# Patient Record
Sex: Male | Born: 1975
Health system: Southern US, Community
[De-identification: ages and names within clinical notes are randomized; demographics above are authoritative.]

## PROBLEM LIST (undated history)

## (undated) DIAGNOSIS — R0789 Other chest pain: Secondary | ICD-10-CM

## (undated) DIAGNOSIS — F329 Major depressive disorder, single episode, unspecified: Secondary | ICD-10-CM

## (undated) DIAGNOSIS — Z8619 Personal history of other infectious and parasitic diseases: Secondary | ICD-10-CM

## (undated) DIAGNOSIS — D171 Benign lipomatous neoplasm of skin and subcutaneous tissue of trunk: Secondary | ICD-10-CM

## (undated) DIAGNOSIS — F319 Bipolar disorder, unspecified: Secondary | ICD-10-CM

## (undated) DIAGNOSIS — F419 Anxiety disorder, unspecified: Secondary | ICD-10-CM

## (undated) DIAGNOSIS — E119 Type 2 diabetes mellitus without complications: Secondary | ICD-10-CM

## (undated) DIAGNOSIS — F1020 Alcohol dependence, uncomplicated: Secondary | ICD-10-CM

## (undated) DIAGNOSIS — F32A Depression, unspecified: Secondary | ICD-10-CM

## (undated) DIAGNOSIS — E785 Hyperlipidemia, unspecified: Secondary | ICD-10-CM

## (undated) DIAGNOSIS — K589 Irritable bowel syndrome without diarrhea: Secondary | ICD-10-CM

## (undated) DIAGNOSIS — Z972 Presence of dental prosthetic device (complete) (partial): Secondary | ICD-10-CM

## (undated) DIAGNOSIS — J302 Other seasonal allergic rhinitis: Secondary | ICD-10-CM

## (undated) DIAGNOSIS — Z87442 Personal history of urinary calculi: Secondary | ICD-10-CM

## (undated) HISTORY — DX: Type 2 diabetes mellitus without complications: E11.9

## (undated) HISTORY — DX: Depression, unspecified: F32.A

## (undated) HISTORY — DX: Irritable bowel syndrome, unspecified: K58.9

## (undated) HISTORY — DX: Alcohol dependence, uncomplicated: F10.20

## (undated) HISTORY — DX: Anxiety disorder, unspecified: F41.9

## (undated) HISTORY — DX: Other chest pain: R07.89

## (undated) HISTORY — DX: Personal history of other infectious and parasitic diseases: Z86.19

## (undated) HISTORY — DX: Major depressive disorder, single episode, unspecified: F32.9

---

## 2010-06-30 ENCOUNTER — Emergency Department (HOSPITAL_COMMUNITY)
Admission: EM | Admit: 2010-06-30 | Discharge: 2010-07-01 | Payer: Self-pay | Source: Home / Self Care | Admitting: Emergency Medicine

## 2010-07-09 ENCOUNTER — Emergency Department (HOSPITAL_COMMUNITY)
Admission: EM | Admit: 2010-07-09 | Discharge: 2010-07-09 | Payer: Self-pay | Source: Home / Self Care | Admitting: Emergency Medicine

## 2010-09-19 LAB — URINALYSIS, ROUTINE W REFLEX MICROSCOPIC
Glucose, UA: NEGATIVE mg/dL
Ketones, ur: NEGATIVE mg/dL
Leukocytes, UA: NEGATIVE
Nitrite: NEGATIVE
Protein, ur: NEGATIVE mg/dL
Specific Gravity, Urine: 1.028 (ref 1.005–1.030)
pH: 6 (ref 5.0–8.0)

## 2011-08-14 DIAGNOSIS — R5381 Other malaise: Secondary | ICD-10-CM | POA: Diagnosis not present

## 2012-01-02 ENCOUNTER — Emergency Department (HOSPITAL_COMMUNITY)
Admission: EM | Admit: 2012-01-02 | Discharge: 2012-01-03 | Disposition: A | Discharge status: Home / Self Care | Payer: Medicare Other | Attending: Emergency Medicine | Admitting: Emergency Medicine

## 2012-01-02 ENCOUNTER — Encounter (HOSPITAL_COMMUNITY): Payer: Self-pay | Admitting: *Deleted

## 2012-01-02 DIAGNOSIS — R112 Nausea with vomiting, unspecified: Secondary | ICD-10-CM | POA: Diagnosis not present

## 2012-01-02 DIAGNOSIS — R109 Unspecified abdominal pain: Secondary | ICD-10-CM | POA: Diagnosis not present

## 2012-01-02 DIAGNOSIS — F17200 Nicotine dependence, unspecified, uncomplicated: Secondary | ICD-10-CM | POA: Diagnosis not present

## 2012-01-02 DIAGNOSIS — F319 Bipolar disorder, unspecified: Secondary | ICD-10-CM | POA: Insufficient documentation

## 2012-01-02 DIAGNOSIS — E785 Hyperlipidemia, unspecified: Secondary | ICD-10-CM | POA: Insufficient documentation

## 2012-01-02 HISTORY — DX: Hyperlipidemia, unspecified: E78.5

## 2012-01-02 HISTORY — DX: Bipolar disorder, unspecified: F31.9

## 2012-01-02 LAB — COMPREHENSIVE METABOLIC PANEL
ALT: 19 U/L (ref 0–53)
AST: 22 U/L (ref 0–37)
Alkaline Phosphatase: 73 U/L (ref 39–117)
Calcium: 9.6 mg/dL (ref 8.4–10.5)
Creatinine, Ser: 1.01 mg/dL (ref 0.50–1.35)
Total Bilirubin: 0.5 mg/dL (ref 0.3–1.2)
Total Protein: 7.1 g/dL (ref 6.0–8.3)

## 2012-01-02 LAB — CBC
Hemoglobin: 15.5 g/dL (ref 13.0–17.0)
MCH: 30.7 pg (ref 26.0–34.0)
MCHC: 34.6 g/dL (ref 30.0–36.0)
MCV: 88.7 fL (ref 78.0–100.0)
RBC: 5.05 MIL/uL (ref 4.22–5.81)
RDW: 12.5 % (ref 11.5–15.5)

## 2012-01-02 LAB — URINE MICROSCOPIC-ADD ON

## 2012-01-02 LAB — URINALYSIS, ROUTINE W REFLEX MICROSCOPIC
Bilirubin Urine: NEGATIVE
Hgb urine dipstick: NEGATIVE
Ketones, ur: 15 mg/dL — AB
Nitrite: NEGATIVE
Specific Gravity, Urine: 1.029 (ref 1.005–1.030)
pH: 7.5 (ref 5.0–8.0)

## 2012-01-02 LAB — DIFFERENTIAL
Eosinophils Relative: 0 % (ref 0–5)
Lymphocytes Relative: 35 % (ref 12–46)
Neutrophils Relative %: 57 % (ref 43–77)

## 2012-01-02 MED ORDER — ONDANSETRON 4 MG PO TBDP
8.0000 mg | ORAL_TABLET | Freq: Once | ORAL | Status: AC
  Administered 2012-01-02: 4 mg via ORAL
  Filled 2012-01-02: qty 2

## 2012-01-02 MED ORDER — MORPHINE SULFATE 4 MG/ML IJ SOLN
4.0000 mg | Freq: Once | INTRAMUSCULAR | Status: AC
  Administered 2012-01-02: 4 mg via INTRAVENOUS
  Filled 2012-01-02: qty 1

## 2012-01-02 MED ORDER — ONDANSETRON 8 MG PO TBDP: 8.0000 mg | ORAL_TABLET | Freq: Three times a day (TID) | ORAL | Status: AC | PRN

## 2012-01-02 MED ORDER — SODIUM CHLORIDE 0.9 % IV BOLUS (SEPSIS)
1000.0000 mL | Freq: Once | INTRAVENOUS | Status: AC
  Administered 2012-01-02: 1000 mL via INTRAVENOUS

## 2012-01-02 MED ORDER — ONDANSETRON HCL 4 MG/2ML IJ SOLN
4.0000 mg | Freq: Once | INTRAMUSCULAR | Status: AC
  Administered 2012-01-02: 4 mg via INTRAVENOUS
  Filled 2012-01-02: qty 2

## 2012-01-02 NOTE — Discharge Instructions (Signed)
Follow up with your primary care doctor about your hospital visit. Continue to hydrate orally.Take all medications as prescribed & use Zofran as directed for nausea & vomiting.  Read the instructions below for reasons to return to the ER.  ° °The 'BRAT' diet is suggested, then progress to diet as tolerated as symptoms abate. Call if bloody stools, persistent diarrhea, vomiting, fever or abdominal pain. °Bananas.  °Rice.  °Applesauce.  °Toast (and other simple starches such as crackers, potatoes, noodles).  ° °SEEK IMMEDIATE MEDICAL ATTENTION IF: ° °You begin having localized abdominal pain that does not go away or becomes severe (The right side could  possibly be appendicitis. In an adult, the left lower portion of the abdomen could be colitis or diverticulitis) °  °A temperature above 101 develops ° °Repeated vomiting occurs (multiple uncontrollable episodes) or you are unable to keep fluids down ° °Blood is being passed in stools or vomit (bright red or black tarry stools).  ° °Return also if you develop chest pain, difficulty breathing, dizziness or fainting, or become confused, poorly responsive, or inconsolable (young children). ° ° °RESOURCE GUIDE ° °Dental Problems ° °Patients with Medicaid: °[facility]                     [facility] °[address]                                           [address] °Phone:  [phone]                                                  Phone:  [phone] ° °If unable to pay or uninsured, contact:  [facility] or [facility] to become qualified for the adult dental clinic. ° °Chronic Pain Problems °Contact [facility]  [phone] °Patients need to be referred by their primary care doctor. ° °Insufficient Money for Medicine °Contact [employer]:  call "211" or [facility] 271-5999. ° °No Primary Care Doctor °Call [facility]  832-8000 °Other agencies that provide inexpensive medical care °   [facility]  [phone] °   [facility]  [phone] °   [facility]  [phone] °   [facility]  832-4777 °   [facility]  [phone] °   [facility]  [phone] ° °Psychological Services °[facility]  [phone] °[facility]  [phone] °[facility]   [phone] (emergency services [phone]) ° °Substance Abuse Resources °[facility]  336-882-2125 °[facility] 336-784-9470 °[facility] 336-285-9073 °[facility] 336-845-3988 °Residential & Outpatient Substance Abuse Program  800-659-3381 ° °Abuse/Neglect °[place] [facility] (336) 641-3795 °[place] [facility] 800-378-5315 (After Hours) ° °Emergency Shelter °[facility] (336) 271-5985 ° °Maternity Homes °[facility] (336) 275-9566 °[facility] (704) 372-4663 ° °MRSA Hotline #:   832-7006 ° ° ° °[place] [facility] ° °[facility]     [employer]                            [facility] °[address] [place]                       [address]      [address]  °[place]                                                [place]                            [place] °Phone:  349-3220                                   Phone:  [phone]                 Phone:  342-8140 ° °[facility] °Phone:  342-8316 ° °[place] [facility] °(336) 342-1394 °(336) 342-3537 (After Hours) ° ° ° ° °

## 2012-01-02 NOTE — ED Notes (Signed)
Patient states someone at his work placed chemicals in his drink, patient now with abdominal pain and cramping, +n/v, patient states some blood in emesis

## 2012-01-02 NOTE — ED Provider Notes (Signed)
History     CSN: [id number]  Arrival date & time 01/02/12  9147   First MD Initiated Contact with Patient 01/02/12 2015      Chief Complaint  Patient presents with  . Abdominal Pain    (Consider location/radiation/quality/duration/timing/severity/associated sxs/prior treatment) HPI Comments: Patient comes in today with concern that one of his coworkers put a Engineer, agricultural in his energy drink.  He reports that he did not see anyone put anything in his drink and neither did anyone else.  However, he states that his drink tasted different and smelled different after he left it unattended at work.  He then began vomiting.  He reports that he had four episodes of vomiting after drinking the liquid.  He also states there was some redness in his emesis, which he attributed to blood.  He also reports crampy lower abdominal pain that began after drinking this energy drink.  He denies any diarrhea.  He is urinating normally.  No confusion.  No syncope.  Of note he does have a history of Bipolar.     Patient is a 36 y.o. male presenting with abdominal pain. The history is provided by the patient.  Abdominal Pain The primary symptoms of the illness include abdominal pain, nausea, vomiting and hematemesis. The primary symptoms of the illness do not include fever, shortness of breath, diarrhea, hematochezia or dysuria.  The hematemesis is not associated with dizziness.  Symptoms associated with the illness do not include chills, constipation or hematuria.    Past Medical History  Diagnosis Date  . Bipolar 1 disorder   . Hyperlipemia     History reviewed. No pertinent past surgical history.  No family history on file.  History  Substance Use Topics  . Smoking status: Current Everyday Smoker  . Smokeless tobacco: Not on file  . Alcohol Use: No      Review of Systems  Constitutional: Negative for fever and chills.  HENT: Negative for sore throat, drooling and trouble swallowing.     Respiratory: Negative for chest tightness and shortness of breath.   Cardiovascular: Negative for chest pain.  Gastrointestinal: Positive for nausea, vomiting, abdominal pain and hematemesis. Negative for diarrhea, constipation, blood in stool, hematochezia and abdominal distention.  Genitourinary: Negative for dysuria, hematuria, decreased urine volume and difficulty urinating.  Skin: Negative for color change and rash.  Neurological: Negative for dizziness, syncope and light-headedness.    Allergies  Review of patient's allergies indicates no known allergies.  Home Medications  No current outpatient prescriptions on file.  BP 128/81  Pulse 87  Temp 98.6 F (37 C) (Oral)  Resp 19  SpO2 100%  Physical Exam  Nursing note and vitals reviewed. Constitutional: He appears well-developed and well-nourished.  Non-toxic appearance. He does not have a sickly appearance. He does not appear ill. No distress.  HENT:  Head: Normocephalic and atraumatic.  Mouth/Throat: Uvula is midline, oropharynx is clear and moist and mucous membranes are normal. No uvula swelling. No posterior oropharyngeal erythema.  Neck: Normal range of motion. Neck supple.  Cardiovascular: Normal rate, regular rhythm and normal heart sounds.   Pulmonary/Chest: Effort normal and breath sounds normal.  Abdominal: Soft. Normal appearance and bowel sounds are normal. There is tenderness in the right lower quadrant, suprapubic area and left lower quadrant. There is no rigidity, no rebound, no guarding and no CVA tenderness.       Patient did not appear to have any abdominal pain when palpating his abdomen while he was distracted.  Musculoskeletal: Normal range of motion.  Neurological: He is alert.  Skin: Skin is warm and dry. No rash noted. He is not diaphoretic.  Psychiatric: He has a normal mood and affect.    ED Course  Procedures (including critical care time)   Labs Reviewed  COMPREHENSIVE METABOLIC PANEL  CBC   DIFFERENTIAL  LIPASE, BLOOD  URINALYSIS, ROUTINE W REFLEX MICROSCOPIC   No results found.   No diagnosis found.  10:24 PM Reassessed patient.  No active vomiting in the ED.  He states that he vomited in the emesis bag.  When I looked in the bag there was only a small amount of saliva.  No blood.  Will po challenge and reassess.   11:01 PM Patient able to tolerate po liquids.  Patient asking to eat.  He states that he is hungry.    MDM  Patient thought that someone has put a chemical in his energy drink while at work.   VSS.  Labs unremarkable.  Patient non toxic appearing.  He reports several episodes of vomiting, but no vomiting seen in the ED.   Patient able to eat and tolerate po liquids.  Minimal abdominal tenderness on exam.  [employer] came and took samples of the energy drink.  Feel that patient can be discharged home.  Return precautions discussed.        Pascal Lux [place], PA-C 01/03/12 1336

## 2012-01-02 NOTE — ED Notes (Addendum)
Pt states about 1745 he drank two sips of his drink at work that burned his throat and noticed a strong smell to it so he stopped drinking it. Pt states about 15 mins after drinking it he began having lower abd pain, nausea, and vomiting.

## 2012-01-02 NOTE — ED Notes (Signed)
Patient now vomiting, asking for antinausea meds.

## 2012-01-03 NOTE — ED Provider Notes (Signed)
Medical screening examination/treatment/procedure(s) were performed by non-physician practitioner and as supervising physician I was immediately available for consultation/collaboration.   Taesean Reth, MD 01/03/12 1537 

## 2012-01-03 NOTE — ED Notes (Signed)
Pt discharged home in improved condition. 

## 2012-04-08 ENCOUNTER — Ambulatory Visit (INDEPENDENT_AMBULATORY_CARE_PROVIDER_SITE_OTHER): Payer: Federal, State, Local not specified - PPO | Admitting: Family Medicine

## 2012-04-08 VITALS — BP 106/60 | HR 67 | Temp 98.9°F | Resp 18 | Ht 71.0 in | Wt 188.4 lb

## 2012-04-08 DIAGNOSIS — R634 Abnormal weight loss: Secondary | ICD-10-CM

## 2012-04-08 DIAGNOSIS — D179 Benign lipomatous neoplasm, unspecified: Secondary | ICD-10-CM

## 2012-04-08 LAB — POCT CBC
Granulocyte percent: 44 %G (ref 37–80)
MCH, POC: 30.8 pg (ref 27–31.2)
MCV: 96.2 fL (ref 80–97)
MPV: 9.1 fL (ref 0–99.8)
POC Granulocyte: 2.4 (ref 2–6.9)
POC MID %: 7 %M (ref 0–12)
Platelet Count, POC: 187 10*3/uL (ref 142–424)
RBC: 4.83 M/uL (ref 4.69–6.13)

## 2012-04-08 NOTE — Progress Notes (Signed)
Subjective:    Patient ID: Curtis Townsend, male    DOB: December 14, 1975, 36 y.o.   MRN: [id number] Chief Complaint  Patient presents with  . Mass    Has a lump on Back (R side) x few months    HPI  Soft mass in Rt back getting larger and more painful - very worried about it and really wants it removed due to pain.  Has lost about 5 lbs in the past sev mos unintentionally.  Past Medical History  Diagnosis Date  . Bipolar 1 disorder   . Hyperlipemia     no current med.  . Seasonal allergies   . History of kidney stones   . Wears partial dentures     upper  . Lipoma of back 05/2012    right     Review of Systems  Constitutional: Positive for unexpected weight change. Negative for fever, chills, activity change and appetite change.  Cardiovascular: Negative for leg swelling.  Gastrointestinal: Negative for nausea, vomiting, abdominal pain, diarrhea and constipation.  Musculoskeletal: Positive for myalgias. Negative for joint swelling and gait problem.  Skin: Negative for rash.  Neurological: Negative for weakness and numbness.  Hematological: Negative for adenopathy. Does not bruise/bleed easily.      BP 106/60  Pulse 67  Temp 98.9 F (37.2 C) (Oral)  Resp 18  Ht 5\' 11"  (1.803 m)  Wt 188 lb 6.4 oz (85.458 kg)  BMI 26.28 kg/m2  SpO2 99% Objective:   Physical Exam  Constitutional: He is oriented to person, place, and time. He appears well-developed and well-nourished. No distress.  HENT:  Head: Normocephalic and atraumatic.  Eyes: Conjunctivae normal are normal. Pupils are equal, round, and reactive to light. No scleral icterus.  Neck: Normal range of motion. Neck supple. No thyromegaly present.  Cardiovascular: Normal rate, regular rhythm, normal heart sounds and intact distal pulses.   Pulmonary/Chest: Effort normal and breath sounds normal. No respiratory distress.  Musculoskeletal: He exhibits no edema.       Rt well defined subdermal soft nonmobile 3 in mass in  right upper back  Lymphadenopathy:    He has no cervical adenopathy.  Neurological: He is alert and oriented to person, place, and time.  Skin: Skin is warm and dry. He is not diaphoretic.  Psychiatric: He has a normal mood and affect. His behavior is normal.      Results for orders placed in visit on 04/08/12  POCT CBC      Component Value Range   WBC 5.4  4.6 - 10.2 K/uL   Lymph, poc 2.6  0.6 - 3.4   POC LYMPH PERCENT 49.0  10 - 50 %L   MID (cbc) 0.4  0 - 0.9   POC MID % 7.0  0 - 12 %M   POC Granulocyte 2.4  2 - 6.9   Granulocyte percent 44.0  37 - 80 %G   RBC 4.83  4.69 - 6.13 M/uL   Hemoglobin 14.9  14.1 - 18.1 g/dL   HCT, POC 62.9  52.8 - 53.7 %   MCV 96.2  80 - 97 fL   MCH, POC 30.8  27 - 31.2 pg   MCHC 32.0  31.8 - 35.4 g/dL   RDW, POC 41.3     Platelet Count, POC 187  142 - 424 K/uL   MPV 9.1  0 - 99.8 fL    Assessment & Plan:   1. Lipoma  Ambulatory referral to General Surgery  2. Weight loss  POCT CBC, HIV antibody, TSH

## 2012-04-08 NOTE — Patient Instructions (Signed)
Lipoma A lipoma is a noncancerous (benign) tumor composed of fat cells. They are usually found under the skin (subcutaneous). A lipoma may occur in any tissue of the body that contains fat. Common areas for lipomas to appear include the back, shoulders, buttocks, and thighs. Lipomas are a very common soft tissue growth. They are soft and grow slowly. Most problems caused by a lipoma depend on where it is growing. DIAGNOSIS  A lipoma can be diagnosed with a physical exam. These tumors rarely become cancerous, but radiographic studies can help determine this for certain. Studies used may include:  Computerized X-ray scans (CT or CAT scan).   Computerized magnetic scans (MRI).  TREATMENT  Small lipomas that are not causing problems may be watched. If a lipoma continues to enlarge or causes problems, removal is often the best treatment. Lipomas can also be removed to improve appearance. Surgery is done to remove the fatty cells and the surrounding capsule. Most often, this is done with medicine that numbs the area (local anesthetic). The removed tissue is examined under a microscope to make sure it is not cancerous. Keep all follow-up appointments with your caregiver. SEEK MEDICAL CARE IF:   The lipoma becomes larger or hard.   The lipoma becomes painful, red, or increasingly swollen. These could be signs of infection or a more serious condition.  Document Released: 06/16/2002 Document Revised: 06/15/2011 Document Reviewed: 11/26/2009 Exit[employer] Patient Information 2012 [employer]. 

## 2012-04-09 LAB — HIV ANTIBODY (ROUTINE TESTING W REFLEX): HIV: NONREACTIVE

## 2012-04-23 ENCOUNTER — Ambulatory Visit (INDEPENDENT_AMBULATORY_CARE_PROVIDER_SITE_OTHER): Payer: Medicare Other | Admitting: General Surgery

## 2012-04-24 ENCOUNTER — Telehealth (INDEPENDENT_AMBULATORY_CARE_PROVIDER_SITE_OTHER): Payer: Self-pay

## 2012-04-24 NOTE — Telephone Encounter (Signed)
Follow up call made to patient.  Patient no showed appointment on 04/23/12 with Dr. Derrell Lolling.

## 2012-05-10 DIAGNOSIS — D171 Benign lipomatous neoplasm of skin and subcutaneous tissue of trunk: Secondary | ICD-10-CM

## 2012-05-10 HISTORY — DX: Benign lipomatous neoplasm of skin and subcutaneous tissue of trunk: D17.1

## 2012-05-16 ENCOUNTER — Ambulatory Visit (INDEPENDENT_AMBULATORY_CARE_PROVIDER_SITE_OTHER): Payer: Federal, State, Local not specified - PPO | Admitting: General Surgery

## 2012-05-16 ENCOUNTER — Encounter (INDEPENDENT_AMBULATORY_CARE_PROVIDER_SITE_OTHER): Payer: Self-pay | Admitting: General Surgery

## 2012-05-16 VITALS — BP 122/76 | HR 82 | Temp 98.5°F | Resp 18 | Ht 72.0 in | Wt 186.0 lb

## 2012-05-16 DIAGNOSIS — D171 Benign lipomatous neoplasm of skin and subcutaneous tissue of trunk: Secondary | ICD-10-CM

## 2012-05-16 DIAGNOSIS — D1779 Benign lipomatous neoplasm of other sites: Secondary | ICD-10-CM

## 2012-05-16 NOTE — Progress Notes (Signed)
Patient ID: Curtis Townsend, male   DOB: [birth year], 36 y.o.   MRN: [id number]  Chief Complaint  Patient presents with  . New Evaluation    lipoma    HPI [name] is a 36 y.o. male is referred by Dr. Clelia Croft for a right back lipoma. He states he's noticed it for approximately 6 months has been causing discomfort. Patient is noticed no growth of the fatty mass during this time.there's been no drainage or erythema surrounding the mass .HPI  Past Medical History  Diagnosis Date  . Bipolar 1 disorder   . Hyperlipemia     No past surgical history on file.  No family history on file.  Social History History  Substance Use Topics  . Smoking status: Current Some Day Smoker  . Smokeless tobacco: Not on file  . Alcohol Use: No    No Known Allergies  Current Outpatient Prescriptions  Medication Sig Dispense Refill  . divalproex (DEPAKOTE SPRINKLE) 125 MG capsule Take 125 mg by mouth 2 (two) times daily.      Marland Kitchen OLANZapine zydis (ZYPREXA) 10 MG disintegrating tablet Take 10 mg by mouth at bedtime.        Review of Systems Review of Systems  Constitutional: Negative.   HENT: Negative.   Eyes: Negative.   Respiratory: Negative.   Cardiovascular: Negative.   Gastrointestinal: Negative.   Musculoskeletal: Negative.   Neurological: Negative.     Blood pressure 122/76, pulse 82, temperature 98.5 F (36.9 C), temperature source Oral, resp. rate 18, height 6' (1.829 m), weight 186 lb (84.369 kg).  Physical Exam Physical Exam  Constitutional: He is oriented to person, place, and time. He appears well-developed and well-nourished.  HENT:  Head: Normocephalic and atraumatic.  Eyes: Conjunctivae normal and EOM are normal. Pupils are equal, round, and reactive to light.  Neck: Normal range of motion. Neck supple.  Cardiovascular: Normal rate and regular rhythm.   Pulmonary/Chest: Effort normal and breath sounds normal.         Approximately 3 x 3 cm subcutaneous mobile  fatty mass  Abdominal: Soft. Bowel sounds are normal.  Musculoskeletal: Normal range of motion.  Neurological: He is oriented to person, place, and time.    Data Reviewed None  Assessment    The patient is a 37 year old male with a right back lipoma    Plan    1. We'll proceed operative for excision of his back lipoma  2. All risks and benefits were discussed with the patient, to generally include infection, bleeding, damage to surrounding structures, and recurrence. Alternatives were offered and described.  All questions were answered and the patient voiced understanding of the procedure and wishes to proceed at this point.        Marigene Ehlers., Helaine Yackel 05/16/2012, 10:52 AM

## 2012-06-04 ENCOUNTER — Encounter (HOSPITAL_BASED_OUTPATIENT_CLINIC_OR_DEPARTMENT_OTHER): Payer: Self-pay | Admitting: *Deleted

## 2012-06-10 ENCOUNTER — Encounter (HOSPITAL_BASED_OUTPATIENT_CLINIC_OR_DEPARTMENT_OTHER): Payer: Self-pay | Admitting: Certified Registered"

## 2012-06-10 ENCOUNTER — Encounter (HOSPITAL_BASED_OUTPATIENT_CLINIC_OR_DEPARTMENT_OTHER)
Admission: RE | Disposition: A | Discharge status: Home / Self Care | Payer: Self-pay | Source: Ambulatory Visit | Attending: General Surgery

## 2012-06-10 ENCOUNTER — Encounter (HOSPITAL_BASED_OUTPATIENT_CLINIC_OR_DEPARTMENT_OTHER): Payer: Self-pay | Admitting: *Deleted

## 2012-06-10 ENCOUNTER — Ambulatory Visit (HOSPITAL_BASED_OUTPATIENT_CLINIC_OR_DEPARTMENT_OTHER): Payer: Federal, State, Local not specified - PPO | Admitting: Certified Registered"

## 2012-06-10 ENCOUNTER — Ambulatory Visit (HOSPITAL_BASED_OUTPATIENT_CLINIC_OR_DEPARTMENT_OTHER)
Admission: RE | Admit: 2012-06-10 | Discharge: 2012-06-10 | Disposition: A | Discharge status: Home / Self Care | Payer: Federal, State, Local not specified - PPO | Source: Ambulatory Visit | Attending: General Surgery | Admitting: General Surgery

## 2012-06-10 DIAGNOSIS — F17200 Nicotine dependence, unspecified, uncomplicated: Secondary | ICD-10-CM | POA: Insufficient documentation

## 2012-06-10 DIAGNOSIS — F319 Bipolar disorder, unspecified: Secondary | ICD-10-CM | POA: Diagnosis not present

## 2012-06-10 DIAGNOSIS — D1779 Benign lipomatous neoplasm of other sites: Secondary | ICD-10-CM | POA: Diagnosis not present

## 2012-06-10 DIAGNOSIS — E785 Hyperlipidemia, unspecified: Secondary | ICD-10-CM | POA: Diagnosis not present

## 2012-06-10 DIAGNOSIS — D1739 Benign lipomatous neoplasm of skin and subcutaneous tissue of other sites: Secondary | ICD-10-CM | POA: Diagnosis not present

## 2012-06-10 HISTORY — PX: LIPOMA EXCISION: SHX5283

## 2012-06-10 HISTORY — DX: Other seasonal allergic rhinitis: J30.2

## 2012-06-10 HISTORY — DX: Personal history of urinary calculi: Z87.442

## 2012-06-10 HISTORY — DX: Presence of dental prosthetic device (complete) (partial): Z97.2

## 2012-06-10 HISTORY — DX: Benign lipomatous neoplasm of skin and subcutaneous tissue of trunk: D17.1

## 2012-06-10 SURGERY — EXCISION LIPOMA
Anesthesia: General | Site: Back | Laterality: Right | Wound class: Clean

## 2012-06-10 MED ORDER — BUPIVACAINE HCL (PF) 0.25 % IJ SOLN
INTRAMUSCULAR | Status: DC | PRN
  Administered 2012-06-10: 10 mL

## 2012-06-10 MED ORDER — CEFAZOLIN SODIUM-DEXTROSE 2-3 GM-% IV SOLR
2.0000 g | INTRAVENOUS | Status: AC
  Administered 2012-06-10: 2 g via INTRAVENOUS

## 2012-06-10 MED ORDER — PROPOFOL 10 MG/ML IV BOLUS
INTRAVENOUS | Status: DC | PRN
  Administered 2012-06-10: 150 mg via INTRAVENOUS

## 2012-06-10 MED ORDER — GLYCOPYRROLATE 0.2 MG/ML IJ SOLN
INTRAMUSCULAR | Status: DC | PRN
  Administered 2012-06-10: 0.2 mg via INTRAVENOUS

## 2012-06-10 MED ORDER — LACTATED RINGERS IV SOLN
INTRAVENOUS | Status: DC
  Administered 2012-06-10: 14:00:00 via INTRAVENOUS

## 2012-06-10 MED ORDER — SUCCINYLCHOLINE CHLORIDE 20 MG/ML IJ SOLN
INTRAMUSCULAR | Status: DC | PRN
  Administered 2012-06-10: 140 mg via INTRAVENOUS

## 2012-06-10 MED ORDER — MIDAZOLAM HCL 2 MG/2ML IJ SOLN: 1.0000 mg | INTRAMUSCULAR | Status: DC | PRN

## 2012-06-10 MED ORDER — FENTANYL CITRATE 0.05 MG/ML IJ SOLN
INTRAMUSCULAR | Status: DC | PRN
  Administered 2012-06-10: 100 ug via INTRAVENOUS

## 2012-06-10 MED ORDER — LIDOCAINE HCL (CARDIAC) 20 MG/ML IV SOLN
INTRAVENOUS | Status: DC | PRN
  Administered 2012-06-10: 100 mg via INTRAVENOUS

## 2012-06-10 MED ORDER — DEXAMETHASONE SODIUM PHOSPHATE 4 MG/ML IJ SOLN
INTRAMUSCULAR | Status: DC | PRN
  Administered 2012-06-10: 10 mg via INTRAVENOUS

## 2012-06-10 MED ORDER — ONDANSETRON HCL 4 MG/2ML IJ SOLN
INTRAMUSCULAR | Status: DC | PRN
  Administered 2012-06-10: 4 mg via INTRAVENOUS

## 2012-06-10 MED ORDER — FENTANYL CITRATE 0.05 MG/ML IJ SOLN: 50.0000 ug | INTRAMUSCULAR | Status: DC | PRN

## 2012-06-10 MED ORDER — CHLORHEXIDINE GLUCONATE 4 % EX LIQD: 1.0000 "application " | Freq: Once | CUTANEOUS | Status: DC

## 2012-06-10 MED ORDER — OXYCODONE-ACETAMINOPHEN 5-325 MG PO TABS: 1.0000 | ORAL_TABLET | ORAL | Status: DC | PRN

## 2012-06-10 MED ORDER — MIDAZOLAM HCL 5 MG/5ML IJ SOLN
INTRAMUSCULAR | Status: DC | PRN
  Administered 2012-06-10: 2 mg via INTRAVENOUS

## 2012-06-10 SURGICAL SUPPLY — 46 items
APPLIER CLIP 9.375 MED OPEN (MISCELLANEOUS)
BENZOIN TINCTURE PRP APPL 2/3 (GAUZE/BANDAGES/DRESSINGS) IMPLANT
BLADE SURG 15 STRL LF DISP TIS (BLADE) ×1 IMPLANT
BLADE SURG 15 STRL SS (BLADE) ×1
CANISTER SUCTION 1200CC (MISCELLANEOUS) IMPLANT
CHLORAPREP W/TINT 26ML (MISCELLANEOUS) ×2 IMPLANT
CLIP APPLIE 9.375 MED OPEN (MISCELLANEOUS) IMPLANT
CLOTH BEACON ORANGE TIMEOUT ST (SAFETY) ×2 IMPLANT
COVER MAYO STAND STRL (DRAPES) ×2 IMPLANT
COVER TABLE BACK 60X90 (DRAPES) ×2 IMPLANT
DECANTER SPIKE VIAL GLASS SM (MISCELLANEOUS) IMPLANT
DERMABOND ADVANCED (GAUZE/BANDAGES/DRESSINGS) ×1
DERMABOND ADVANCED .7 DNX12 (GAUZE/BANDAGES/DRESSINGS) ×1 IMPLANT
DRAPE PED LAPAROTOMY (DRAPES) ×2 IMPLANT
DRSG TEGADERM 4X4.75 (GAUZE/BANDAGES/DRESSINGS) IMPLANT
ELECT COATED BLADE 2.86 ST (ELECTRODE) ×2 IMPLANT
ELECT REM PT RETURN 9FT ADLT (ELECTROSURGICAL) ×2
ELECTRODE REM PT RTRN 9FT ADLT (ELECTROSURGICAL) ×1 IMPLANT
GAUZE SPONGE 4X4 12PLY STRL LF (GAUZE/BANDAGES/DRESSINGS) IMPLANT
GLOVE BIO SURGEON STRL SZ7.5 (GLOVE) ×2 IMPLANT
GLOVE BIOGEL PI IND STRL 8 (GLOVE) ×1 IMPLANT
GLOVE BIOGEL PI INDICATOR 8 (GLOVE) ×1
GLOVE ECLIPSE 6.5 STRL STRAW (GLOVE) ×2 IMPLANT
GOWN PREVENTION PLUS XLARGE (GOWN DISPOSABLE) ×4 IMPLANT
NEEDLE HYPO 25X1 1.5 SAFETY (NEEDLE) ×2 IMPLANT
NS IRRIG 1000ML POUR BTL (IV SOLUTION) ×2 IMPLANT
PACK BASIN DAY SURGERY FS (CUSTOM PROCEDURE TRAY) ×2 IMPLANT
PENCIL BUTTON HOLSTER BLD 10FT (ELECTRODE) ×2 IMPLANT
SLEEVE SCD COMPRESS KNEE MED (MISCELLANEOUS) ×2 IMPLANT
SPONGE LAP 4X18 X RAY DECT (DISPOSABLE) ×2 IMPLANT
STRIP CLOSURE SKIN 1/2X4 (GAUZE/BANDAGES/DRESSINGS) IMPLANT
SUT MNCRL AB 3-0 PS2 18 (SUTURE) IMPLANT
SUT MNCRL AB 4-0 PS2 18 (SUTURE) ×2 IMPLANT
SUT SILK 2 0 SH (SUTURE) IMPLANT
SUT VIC AB 2-0 SH 27 (SUTURE)
SUT VIC AB 2-0 SH 27XBRD (SUTURE) IMPLANT
SUT VIC AB 3-0 SH 27 (SUTURE) ×1
SUT VIC AB 3-0 SH 27X BRD (SUTURE) ×1 IMPLANT
SUT VIC AB 5-0 PS2 18 (SUTURE) IMPLANT
SUT VICRYL AB 3 0 TIES (SUTURE) IMPLANT
SYR BULB 3OZ (MISCELLANEOUS) ×2 IMPLANT
SYR CONTROL 10ML LL (SYRINGE) ×2 IMPLANT
TOWEL OR 17X24 6PK STRL BLUE (TOWEL DISPOSABLE) ×2 IMPLANT
TOWEL OR NON WOVEN STRL DISP B (DISPOSABLE) ×2 IMPLANT
TUBE CONNECTING 20X1/4 (TUBING) IMPLANT
YANKAUER SUCT BULB TIP NO VENT (SUCTIONS) IMPLANT

## 2012-06-10 NOTE — Interval H&P Note (Signed)
History and Physical Interval Note:  06/10/2012 1:49 PM  Curtis Townsend  has presented today for surgery, with the diagnosis of back lipoma  The various methods of treatment have been discussed with the patient and family. After consideration of risks, benefits and other options for treatment, the patient has consented to  Procedure(s) (LRB) with comments: EXCISION LIPOMA (Right) - excision of back lipoma on right 3x3cm as a surgical intervention .  The patient's history has been reviewed, patient examined, no change in status, stable for surgery.  I have reviewed the patient's chart and labs.  Questions were answered to the patient's satisfaction.     Marigene Ehlers., Jed Limerick

## 2012-06-10 NOTE — Op Note (Signed)
Pre Operative Diagnosis:  Back fatty mass  Post Operative Diagnosis: same  Procedure: excision of fatty mass  Surgeon: Dr. Axel Filler  Assistant: none  Anesthesia: GETA  EBL: less than 5 cc  Complications: none  Counts: reported as correct x 2  Findings:  The patient had possibly a 4 cm fatty mass deep to the latissimus dorsi muscle  Indications for procedure:  The patient is a 36 year old male with history of right-sided back a mass becoming larger in size and more bothersome to the patient. Patient was seen in clinic and set at the selectively removed.  Details of the procedure:The patient was taken back to the operating room. The patient was placed in prone position with bilateral SCDs in place. After appropriate anitbiotics were confirmed, a time-out was confirmed and all facts were verified.  a 2.5 cm incision was made over the fatty mass.  Bovie cautery was used to maintain hemostasis and dissection was carried down to the fascia. At this point seemed like a fatty mass was deep to the latissimus dorsi muscle. This muscle was split and the fatty mass appeared below this muscle. I begin to extract the fatty mass from the surrounding tissue using Bovie cautery. The mass was then removed in 2 pieces with a majority of the capsule encasing the mass. The mass or fat or capsule left in the wound. There is irrigated out sterile saline the fascia was then reapproximated using a 3-0 Vicryl. Skin was reapproximated using 4-0 Monocryl subcuticular fashion. The skin was Dermabond.The patient was taken to the recovery room in stable condition.

## 2012-06-10 NOTE — Anesthesia Preprocedure Evaluation (Signed)
Anesthesia Evaluation  Patient identified by MRN, date of birth, ID band Patient awake    Reviewed: Allergy & Precautions, H&P , NPO status , Patient's Chart, lab work & pertinent test results  Airway Mallampati: II TM Distance: >3 FB Neck ROM: Full    Dental   Pulmonary          Cardiovascular     Neuro/Psych    GI/Hepatic   Endo/Other    Renal/GU      Musculoskeletal   Abdominal   Peds  Hematology   Anesthesia Other Findings   Reproductive/Obstetrics                           Anesthesia Physical Anesthesia Plan  ASA: III  Anesthesia Plan: General   Post-op Pain Management:    Induction: Intravenous  Airway Management Planned: Oral ETT  Additional Equipment:   Intra-op Plan:   Post-operative Plan: Extubation in OR  Informed Consent: I have reviewed the patients History and Physical, chart, labs and discussed the procedure including the risks, benefits and alternatives for the proposed anesthesia with the patient or authorized representative who has indicated his/her understanding and acceptance.     Plan Discussed with: CRNA and Surgeon  Anesthesia Plan Comments:         Anesthesia Quick Evaluation

## 2012-06-10 NOTE — Transfer of Care (Signed)
Immediate Anesthesia Transfer of Care Note  Patient: Curtis Townsend  Procedure(s) Performed: Procedure(s) (LRB) with comments: EXCISION LIPOMA (Right) - excision of back lipoma on right 3x3cm  Patient Location: PACU  Anesthesia Type:General  Level of Consciousness: sedated and patient cooperative  Airway & Oxygen Therapy: Patient Spontanous Breathing and Patient connected to face mask oxygen  Post-op Assessment: Report given to PACU RN and Post -op Vital signs reviewed and stable  Post vital signs: Reviewed and stable  Complications: No apparent anesthesia complications

## 2012-06-10 NOTE — H&P (View-Only) (Signed)
Patient ID: Curtis Townsend, male   DOB: 11/17/1975, 36 y.o.   MRN: [id number]  Chief Complaint  Patient presents with  . New Evaluation    lipoma    HPI [name] is a 36 y.o. male is referred by Dr. Shaw for a right back lipoma. He states he's noticed it for approximately 6 months has been causing discomfort. Patient is noticed no growth of the fatty mass during this time.there's been no drainage or erythema surrounding the mass .HPI  Past Medical History  Diagnosis Date  . Bipolar 1 disorder   . Hyperlipemia     No past surgical history on file.  No family history on file.  Social History History  Substance Use Topics  . Smoking status: Current Some Day Smoker  . Smokeless tobacco: Not on file  . Alcohol Use: No    No Known Allergies  Current Outpatient Prescriptions  Medication Sig Dispense Refill  . divalproex (DEPAKOTE SPRINKLE) 125 MG capsule Take 125 mg by mouth 2 (two) times daily.      . OLANZapine zydis (ZYPREXA) 10 MG disintegrating tablet Take 10 mg by mouth at bedtime.        Review of Systems Review of Systems  Constitutional: Negative.   HENT: Negative.   Eyes: Negative.   Respiratory: Negative.   Cardiovascular: Negative.   Gastrointestinal: Negative.   Musculoskeletal: Negative.   Neurological: Negative.     Blood pressure 122/76, pulse 82, temperature 98.5 F (36.9 C), temperature source Oral, resp. rate 18, height 6' (1.829 m), weight 186 lb (84.369 kg).  Physical Exam Physical Exam  Constitutional: He is oriented to person, place, and time. He appears well-developed and well-nourished.  HENT:  Head: Normocephalic and atraumatic.  Eyes: Conjunctivae normal and EOM are normal. Pupils are equal, round, and reactive to light.  Neck: Normal range of motion. Neck supple.  Cardiovascular: Normal rate and regular rhythm.   Pulmonary/Chest: Effort normal and breath sounds normal.         Approximately 3 x 3 cm subcutaneous mobile  fatty mass  Abdominal: Soft. Bowel sounds are normal.  Musculoskeletal: Normal range of motion.  Neurological: He is oriented to person, place, and time.    Data Reviewed None  Assessment    The patient is a 36-year-old male with a right back lipoma    Plan    1. We'll proceed operative for excision of his back lipoma  2. All risks and benefits were discussed with the patient, to generally include infection, bleeding, damage to surrounding structures, and recurrence. Alternatives were offered and described.  All questions were answered and the patient voiced understanding of the procedure and wishes to proceed at this point.        Ivyana Locey Jr., Annleigh Knueppel 05/16/2012, 10:52 AM    

## 2012-06-10 NOTE — Anesthesia Postprocedure Evaluation (Signed)
Anesthesia Post Note  Patient: Curtis Townsend  Procedure(s) Performed: Procedure(s) (LRB): EXCISION LIPOMA (Right)  Anesthesia type: general  Patient location: PACU  Post pain: Pain level controlled  Post assessment: Patient's Cardiovascular Status Stable  Last Vitals:  Filed Vitals:   06/10/12 1555  BP:   Pulse: 71  Temp: 36.4 C  Resp: 14    Post vital signs: Reviewed and stable  Level of consciousness: sedated  Complications: No apparent anesthesia complications

## 2012-06-10 NOTE — Anesthesia Procedure Notes (Signed)
Procedure Name: Intubation Date/Time: 06/10/2012 2:15 PM Performed by: Verlan Friends Pre-anesthesia Checklist: Patient identified, Emergency Drugs available, Suction available, Patient being monitored and Timeout performed Patient Re-evaluated:Patient Re-evaluated prior to inductionOxygen Delivery Method: Circle System Utilized Preoxygenation: Pre-oxygenation with 100% oxygen Intubation Type: IV induction Ventilation: Mask ventilation without difficulty Laryngoscope Size: Miller and 3 Grade View: Grade I Tube type: Oral Tube size: 8.0 mm Number of attempts: 1 Airway Equipment and Method: stylet and oral airway Placement Confirmation: ETT inserted through vocal cords under direct vision,  positive ETCO2 and breath sounds checked- equal and bilateral Secured at: 24 (at lip) cm Tube secured with: Tape Dental Injury: Teeth and Oropharynx as per pre-operative assessment

## 2012-06-11 ENCOUNTER — Encounter (HOSPITAL_BASED_OUTPATIENT_CLINIC_OR_DEPARTMENT_OTHER): Payer: Self-pay | Admitting: General Surgery

## 2012-06-27 ENCOUNTER — Ambulatory Visit (INDEPENDENT_AMBULATORY_CARE_PROVIDER_SITE_OTHER): Payer: Federal, State, Local not specified - PPO | Admitting: General Surgery

## 2012-06-27 ENCOUNTER — Encounter (INDEPENDENT_AMBULATORY_CARE_PROVIDER_SITE_OTHER): Payer: Self-pay | Admitting: General Surgery

## 2012-06-27 VITALS — BP 112/74 | HR 72 | Temp 98.2°F | Resp 14 | Ht 73.0 in | Wt 178.4 lb

## 2012-06-27 DIAGNOSIS — Z09 Encounter for follow-up examination after completed treatment for conditions other than malignant neoplasm: Secondary | ICD-10-CM

## 2012-06-27 MED ORDER — OXYCODONE-ACETAMINOPHEN 5-325 MG PO TABS: 1.0000 | ORAL_TABLET | ORAL | Status: AC | PRN

## 2012-06-27 NOTE — Progress Notes (Signed)
Patient ID: Curtis Townsend, male   DOB: Nov 05, 1975, 36 y.o.   MRN: [id number] The patient is a 36 year old male status post lipoma removed from his back. Patient did do well aside from some muscle soreness. The patient is returned to work with minimal complaints.  On exam: Wounds clean dry  And intact  Assessment and plan:  1. Patient prescription for Percocet  2. Patient felt when necessary

## 2012-06-28 ENCOUNTER — Emergency Department (HOSPITAL_COMMUNITY)
Admission: EM | Admit: 2012-06-28 | Discharge: 2012-06-28 | Disposition: A | Discharge status: Home / Self Care | Payer: Federal, State, Local not specified - PPO | Attending: Emergency Medicine | Admitting: Emergency Medicine

## 2012-06-28 ENCOUNTER — Emergency Department (HOSPITAL_COMMUNITY): Payer: Federal, State, Local not specified - PPO

## 2012-06-28 DIAGNOSIS — R42 Dizziness and giddiness: Secondary | ICD-10-CM | POA: Insufficient documentation

## 2012-06-28 DIAGNOSIS — Z8589 Personal history of malignant neoplasm of other organs and systems: Secondary | ICD-10-CM | POA: Insufficient documentation

## 2012-06-28 DIAGNOSIS — F319 Bipolar disorder, unspecified: Secondary | ICD-10-CM | POA: Insufficient documentation

## 2012-06-28 DIAGNOSIS — W1809XA Striking against other object with subsequent fall, initial encounter: Secondary | ICD-10-CM | POA: Insufficient documentation

## 2012-06-28 DIAGNOSIS — Z79899 Other long term (current) drug therapy: Secondary | ICD-10-CM | POA: Diagnosis not present

## 2012-06-28 DIAGNOSIS — S060X0A Concussion without loss of consciousness, initial encounter: Secondary | ICD-10-CM | POA: Diagnosis not present

## 2012-06-28 DIAGNOSIS — S060X9A Concussion with loss of consciousness of unspecified duration, initial encounter: Secondary | ICD-10-CM | POA: Diagnosis not present

## 2012-06-28 DIAGNOSIS — E785 Hyperlipidemia, unspecified: Secondary | ICD-10-CM | POA: Diagnosis not present

## 2012-06-28 DIAGNOSIS — Z87442 Personal history of urinary calculi: Secondary | ICD-10-CM | POA: Insufficient documentation

## 2012-06-28 DIAGNOSIS — S0990XA Unspecified injury of head, initial encounter: Secondary | ICD-10-CM | POA: Diagnosis not present

## 2012-06-28 DIAGNOSIS — Y939 Activity, unspecified: Secondary | ICD-10-CM | POA: Insufficient documentation

## 2012-06-28 DIAGNOSIS — Y929 Unspecified place or not applicable: Secondary | ICD-10-CM | POA: Insufficient documentation

## 2012-06-28 DIAGNOSIS — F17200 Nicotine dependence, unspecified, uncomplicated: Secondary | ICD-10-CM | POA: Insufficient documentation

## 2012-06-28 DIAGNOSIS — S060XAA Concussion with loss of consciousness status unknown, initial encounter: Secondary | ICD-10-CM | POA: Diagnosis not present

## 2012-06-28 MED ORDER — OXYCODONE-ACETAMINOPHEN 5-325 MG PO TABS
2.0000 | ORAL_TABLET | Freq: Once | ORAL | Status: AC
  Administered 2012-06-28: 2 via ORAL
  Filled 2012-06-28: qty 2

## 2012-06-28 MED ORDER — METOCLOPRAMIDE HCL 5 MG/ML IJ SOLN
10.0000 mg | Freq: Once | INTRAMUSCULAR | Status: AC
  Administered 2012-06-28: 10 mg via INTRAMUSCULAR
  Filled 2012-06-28: qty 2

## 2012-06-28 MED ORDER — METOCLOPRAMIDE HCL 10 MG PO TABS: 10.0000 mg | ORAL_TABLET | Freq: Four times a day (QID) | ORAL | Status: DC

## 2012-06-28 NOTE — ED Notes (Signed)
Family at bedside. 

## 2012-06-28 NOTE — ED Provider Notes (Signed)
History     CSN: [id number]  Arrival date & time 06/28/12  1827   First MD Initiated Contact with Patient 06/28/12 1844      Chief Complaint  Patient presents with  . Head Injury    (Consider location/radiation/quality/duration/timing/severity/associated sxs/prior treatment) The history is provided by the patient.  Curtis Townsend is a 36 y.o. male hx of bipolar, HL, lipoma with recent excision here with HA s/p head injury. Around 3:30pm today, he bent down and stood up and his his head on a wooden bar. He then feel light headed and dizzy. Subsequently he had tingling throughout his whole spine and pain on L shoulder. But didn't have any shoulder or back injury during the incident. Never had intracranial bleed in the past.    Past Medical History  Diagnosis Date  . Bipolar 1 disorder   . Hyperlipemia     no current med.  . Seasonal allergies   . History of kidney stones   . Wears partial dentures     upper  . Lipoma of back 05/2012    right    Past Surgical History  Procedure Date  . No past surgeries   . Lipoma excision 06/10/2012    Procedure: EXCISION LIPOMA;  Surgeon: Axel Filler, MD;  Location: [facility];  Service: General;  Laterality: Right;  excision of back lipoma on right 3x3cm    No family history on file.  History  Substance Use Topics  . Smoking status: Current Some Day Smoker -- 16 years    Types: Cigarettes  . Smokeless tobacco: Never Used     Comment: 1-2 cig./month  . Alcohol Use: Yes     Comment: socially      Review of Systems  Neurological: Positive for dizziness and headaches.       Tingling   All other systems reviewed and are negative.    Allergies  Review of patient's allergies indicates no known allergies.  Home Medications   Current Outpatient Rx  Name  Route  Sig  Dispense  Refill  . DIVALPROEX SODIUM 500 MG PO TBEC   Oral   Take 1,000 mg by mouth at bedtime.         . MULTI-VITAMIN/MINERALS PO  TABS   Oral   Take 1 tablet by mouth daily.         Marland Kitchen OLANZAPINE 10 MG PO TBDP   Oral   Take 10 mg by mouth at bedtime.         . OXYCODONE-ACETAMINOPHEN 5-325 MG PO TABS   Oral   Take 1 tablet by mouth every 4 (four) hours as needed for pain.   20 tablet   0   . SERTRALINE HCL 50 MG PO TABS   Oral   Take 50 mg by mouth daily.           BP 108/67  Pulse 75  Temp 98.6 F (37 C) (Oral)  Resp 16  SpO2 99%  Physical Exam  Nursing note and vitals reviewed. Constitutional: He is oriented to person, place, and time. He appears well-developed and well-nourished.       Closing his eyes, NAD   HENT:  Head: Normocephalic.  Mouth/Throat: Oropharynx is clear and moist.       No scalp hematoma   Eyes: Conjunctivae normal are normal. Pupils are equal, round, and reactive to light.  Neck: Normal range of motion. Neck supple.       Neck ROM nl  Cardiovascular: Normal rate, regular rhythm and normal heart sounds.   Pulmonary/Chest: Effort normal and breath sounds normal. No respiratory distress. He has no wheezes. He has no rales.  Abdominal: Soft. Bowel sounds are normal. He exhibits no distension. There is no tenderness. There is no rebound.  Musculoskeletal: Normal range of motion. He exhibits no edema and no tenderness.       No midline tenderness. + L trapezius tenderness.   Neurological: He is alert and oriented to person, place, and time. No cranial nerve deficit.       Nl gait. Nl strength and sensation throughout   Skin: Skin is warm and dry.  Psychiatric: He has a normal mood and affect. His behavior is normal. Judgment and thought content normal.    ED Course  Procedures (including critical care time)  Labs Reviewed - No data to display Ct Head Wo Contrast  06/28/2012  *RADIOLOGY REPORT*  Clinical Data: Trauma to head.  Dizzy.  CT HEAD WITHOUT CONTRAST  Technique:  Contiguous axial images were obtained from the base of the skull through the vertex without  contrast.  Comparison: None.  Findings: No acute infarct, hemorrhage, or mass lesion is present. The ventricles are of normal size.  No significant extra-axial fluid collection is present.  There is chronic desiccation the right frontal sinus, anterior ethmoid air cells and right maxillary sinus.  Right maxillary sinus appears expanded may represent a mucocele.  The paranasal sinuses are otherwise clear.  Mastoid air cells are clear bilaterally.  The osseous skull is intact.  No significant extracranial soft tissue injury is evident.  IMPRESSION: 1.  Normal CT appearance of the brain. 2.  Chronic right-sided paranasal sinus disease.  Question right maxillary sinus mucocele.   Original Report Authenticated By: Marin Roberts, M.D.      No diagnosis found.    MDM  Curtis Townsend is a 36 y.o. male here with head injury with headaches. Likely has concussion. Will do CT head, give meds and reassess.   8:00 PM CT head nl. Felt better after percocet and reglan. Will d/c home with reglan prn. He has a percocet prescription at home.        Richardean Canal, MD 06/28/12 Rosamaria Lints

## 2012-06-28 NOTE — ED Notes (Signed)
Pt was bending over and stood back up and hit his head on a wooden bar. Pt states he was dizzy and fell to the ground. Pt c/o pain between shoulders and tingling to bottom of spine. Pt denies LOC. Pt c/o slight nausea. Injury occurred at 1330. Pt a/o x 3.

## 2012-06-28 NOTE — Progress Notes (Signed)
Spouse at bedside confirmed pt has medicare and no longer sees Norberto Sorenson after he completed a physical Reports he will be seeing her pcp after she makes and appointment after the new year Reports pt will also be placed on her [employer] employee insurance coverage.  CM discussed with spouse need to contact insurance carrier customer service to verify new pcp is in network with [employer].  She and pt voiced understanding

## 2012-09-02 ENCOUNTER — Other Ambulatory Visit (INDEPENDENT_AMBULATORY_CARE_PROVIDER_SITE_OTHER): Payer: Federal, State, Local not specified - PPO

## 2012-09-02 ENCOUNTER — Encounter: Payer: Self-pay | Admitting: Internal Medicine

## 2012-09-02 ENCOUNTER — Ambulatory Visit (INDEPENDENT_AMBULATORY_CARE_PROVIDER_SITE_OTHER): Payer: Federal, State, Local not specified - PPO | Admitting: Internal Medicine

## 2012-09-02 VITALS — BP 118/76 | HR 76 | Temp 98.3°F | Ht 73.0 in | Wt 172.8 lb

## 2012-09-02 DIAGNOSIS — Z5181 Encounter for therapeutic drug level monitoring: Secondary | ICD-10-CM

## 2012-09-02 DIAGNOSIS — R7302 Impaired glucose tolerance (oral): Secondary | ICD-10-CM

## 2012-09-02 DIAGNOSIS — Z13220 Encounter for screening for lipoid disorders: Secondary | ICD-10-CM | POA: Diagnosis not present

## 2012-09-02 DIAGNOSIS — T148XXA Other injury of unspecified body region, initial encounter: Secondary | ICD-10-CM

## 2012-09-02 DIAGNOSIS — R5383 Other fatigue: Secondary | ICD-10-CM

## 2012-09-02 DIAGNOSIS — Z23 Encounter for immunization: Secondary | ICD-10-CM | POA: Diagnosis not present

## 2012-09-02 DIAGNOSIS — R5381 Other malaise: Secondary | ICD-10-CM

## 2012-09-02 DIAGNOSIS — R634 Abnormal weight loss: Secondary | ICD-10-CM

## 2012-09-02 DIAGNOSIS — R0982 Postnasal drip: Secondary | ICD-10-CM

## 2012-09-02 DIAGNOSIS — R7309 Other abnormal glucose: Secondary | ICD-10-CM

## 2012-09-02 DIAGNOSIS — F319 Bipolar disorder, unspecified: Secondary | ICD-10-CM | POA: Insufficient documentation

## 2012-09-02 LAB — BASIC METABOLIC PANEL
BUN: 11 mg/dL (ref 6–23)
CO2: 28 mEq/L (ref 19–32)
Calcium: 9.4 mg/dL (ref 8.4–10.5)
Chloride: 104 mEq/L (ref 96–112)
Creatinine, Ser: 1 mg/dL (ref 0.4–1.5)

## 2012-09-02 LAB — CBC
HCT: 49.4 % (ref 39.0–52.0)
Hemoglobin: 16.8 g/dL (ref 13.0–17.0)
Platelets: 168 10*3/uL (ref 150.0–400.0)
RDW: 13.3 % (ref 11.5–14.6)
WBC: 4.4 10*3/uL — ABNORMAL LOW (ref 4.5–10.5)

## 2012-09-02 LAB — LIPID PANEL
LDL Cholesterol: 113 mg/dL — ABNORMAL HIGH (ref 0–99)
Total CHOL/HDL Ratio: 3
Triglycerides: 49 mg/dL (ref 0.0–149.0)

## 2012-09-02 LAB — HEMOGLOBIN A1C: Hgb A1c MFr Bld: 5.7 % (ref 4.6–6.5)

## 2012-09-02 MED ORDER — FLUOXETINE HCL 20 MG PO TABS: 20.0000 mg | ORAL_TABLET | Freq: Every day | ORAL | Status: DC

## 2012-09-02 MED ORDER — CYCLOBENZAPRINE HCL 10 MG PO TABS: 10.0000 mg | ORAL_TABLET | Freq: Every day | ORAL | Status: DC

## 2012-09-02 MED ORDER — CETIRIZINE HCL 10 MG PO TABS: 10.0000 mg | ORAL_TABLET | Freq: Every day | ORAL | Status: DC

## 2012-09-02 NOTE — Progress Notes (Signed)
HPI  Pt presents to the clinic today to establish care. He has not been seen by a PCP doctor in 2 years. He does have multiple concerns today. 1- He has this really annoying PND  That seems to bother him at night. He will wake up coughing from feeling something running down his throat. He does have Hay Fever but typically only occurs during the spring. He has tried Flonase in the past but does not like it. He has not tried anything OTC. 2- He is very concerned about his weight loss. He stopped taking his Psych meds about 1 year ago. That is when he started noticed that he is losing weight. He has gone from 235 to 174, which is his normal weight when he is not on his psych medicine. He has a very good appetite. He does state that he eats all the time. He is concerned that he may have a tape worm. He also anal itching all throughout the day. He thinks this must be coming from a tape worm. He has never had any of this evaluated in the past. He does have normal BM's. He does state that he does see some weird tissue floating in the toilet with his BM's that he thinks is coming from the tape worm. He is very concerned about this. 3- He is concerned about some ongoing lower back pain on the right side. He is very active and does a lot of heavy lifting with his job. His back aches at night when he is trying to sleep. He has put icy hot patches on it which does seem to help relieve the pain. The pain occurs during the day but bothers hi at nights when he tries to sleep. He has trouble finding a comfortable position in bed to sleep. 4- Pt has stopped all his Psych medicines. He states that they make him feel so depressed so he stopped them in attempt's "to avoid doing anything stupid to himself" He stopped them 1 year ago. He is interested in starting something for depression, but not the depakote or Zyprexa. He was on Prozac in the past and tolerated it well. He was being seen at [facility] for behavioral health  issues.  Flu: 2012 Tetanus: 2011 Dentist: yearly  Past Medical History  Diagnosis Date  . Bipolar 1 disorder   . Hyperlipemia     no current med.  . Seasonal allergies   . History of kidney stones   . Wears partial dentures     upper  . Lipoma of back 05/2012    right    Current Outpatient Prescriptions  Medication Sig Dispense Refill  . divalproex (DEPAKOTE) 500 MG DR tablet Take 1,000 mg by mouth at bedtime.      . metoCLOPramide (REGLAN) 10 MG tablet Take 1 tablet (10 mg total) by mouth every 6 (six) hours.  15 tablet  0  . Multiple Vitamins-Minerals (MULTIVITAMIN WITH MINERALS) tablet Take 1 tablet by mouth daily.      Marland Kitchen OLANZapine zydis (ZYPREXA) 10 MG disintegrating tablet Take 10 mg by mouth at bedtime.      Marland Kitchen oxyCODONE-acetaminophen (ROXICET) 5-325 MG per tablet Take 1 tablet by mouth every 4 (four) hours as needed for pain.  20 tablet  0  . sertraline (ZOLOFT) 50 MG tablet Take 50 mg by mouth daily.       No current facility-administered medications for this visit.    No Known Allergies  Family History  Problem Relation Age  of Onset  . Stroke Mother   . Hypertension Mother   . Diabetes Mother   . Hypertension Father   . Diabetes Father   . Cancer Paternal Uncle     History   Social History  . Marital Status: Married    Spouse Name: N/A    Number of Children: N/A  . Years of Education: N/A   Occupational History  . Not on file.   Social History Main Topics  . Smoking status: Current Some Day Smoker -- 16 years    Types: Cigarettes  . Smokeless tobacco: Never Used     Comment: 1-2 cig./month  . Alcohol Use: Yes     Comment: socially  . Drug Use: Yes    Special: Marijuana     Comment: last used 06/03/2012  . Sexually Active: Yes    Birth Control/ Protection: Pill   Other Topics Concern  . Not on file   Social History Narrative  . No narrative on file    ROS:  Constitutional: Pt reports fatigue. Denies fever, malaise, headache or abrupt  weight changes.  HEENT: Pt reports PND. Denies eye pain, eye redness, ear pain, ringing in the ears, wax buildup, runny nose, nasal congestion, bloody nose, or sore throat. Respiratory: Denies difficulty breathing, shortness of breath, cough or sputum production.   Cardiovascular: Denies chest pain, chest tightness, palpitations or swelling in the hands or feet.  Gastrointestinal: Pt reports anal itching. Denies abdominal pain, bloating, constipation, diarrhea or blood in the stool.  GU: Denies frequency, urgency, pain with urination, blood in urine, odor or discharge. Musculoskeletal: Pt reports back pain. Denies decrease in range of motion, difficulty with gait, muscle pain or joint pain and swelling.  Skin: Denies redness, rashes, lesions or ulcercations.  Neurological: Denies dizziness, difficulty with memory, difficulty with speech or problems with balance and coordination.  Psych: Pt reports depression secondary to Bipolar disorder. Denies anxiety, SI/HI at this time. Hx of prior suicide attempt.  No other specific complaints in a complete review of systems (except as listed in HPI above).  PE:  BP 118/76  Pulse 76  Temp(Src) 98.3 F (36.8 C) (Oral)  Ht 6\' 1"  (1.854 m)  Wt 172 lb 12.8 oz (78.382 kg)  BMI 22.8 kg/m2  SpO2 97% Wt Readings from Last 3 Encounters:  09/02/12 172 lb 12.8 oz (78.382 kg)  06/27/12 178 lb 6.4 oz (80.922 kg)  06/10/12 183 lb (83.008 kg)    General: Appears his stated age, well developed, well nourished in NAD. HEENT: Head: normal shape and size; Eyes: sclera white, no icterus, conjunctiva pink, PERRLA and EOMs intact; Ears: Tm's gray and intact, normal light reflex; Nose: mucosa pink and moist, septum midline; Throat/Mouth: Teeth present, mucosa pink and moist, no lesions or ulcerations noted.  Neck: Normal range of motion. Neck supple, trachea midline. No massses, lumps or thyromegaly present. Large scar across base of neck secondary to prior suicide  attempt. Cardiovascular: Normal rate and rhythm. S1,S2 noted.  No murmur, rubs or gallops noted. No JVD or BLE edema. No carotid bruits noted. Pulmonary/Chest: Normal effort and positive vesicular breath sounds. No respiratory distress. No wheezes, rales or ronchi noted.  Abdomen: Soft and nontender. Normal bowel sounds, no bruits noted. No distention or masses noted. Liver, spleen and kidneys non palpable. Musculoskeletal: Normal range of motion. No signs of joint swelling. No difficulty with gait.  Psych: Pt reports depression secondary to Bipolar disorder. Denies anxiety, SI/HI at this time. Hx of prior  suicide attempt. Neurological: Alert and oriented. Cranial nerves II-XII intact. Coordination normal. +DTRs bilaterally. Psychiatric: Mood and affect normal. Behavior is normal. Judgment and thought content normal.      Assessment and Plan:  Post nasal drip, likely secondary to allergies, new onset with additional workup required:  eRx for Zyrtec 10 mg daily  Weight loss, likely due to stopping Psych drugs, new onset with additional workup required:  Reassurance given Pt demanding referral to GI to r/o tape worm  Muscle Strain of back, new onset with additional workup required:  Take OTC aleve daily eRx for Flexeril at night  RTC as needed or if symptoms persist

## 2012-09-02 NOTE — Patient Instructions (Signed)
Health Maintenance, Males A healthy lifestyle and preventative care can promote health and wellness.  Maintain regular health, dental, and eye exams.  Eat a healthy diet. Foods like vegetables, fruits, whole grains, low-fat dairy products, and lean protein foods contain the nutrients you need without too many calories. Decrease your intake of foods high in solid fats, added sugars, and salt. Get information about a proper diet from your caregiver, if necessary.  Regular physical exercise is one of the most important things you can do for your health. Most adults should get at least 150 minutes of moderate-intensity exercise (any activity that increases your heart rate and causes you to sweat) each week. In addition, most adults need muscle-strengthening exercises on 2 or more days a week.   Maintain a healthy weight. The body mass index (BMI) is a screening tool to identify possible weight problems. It provides an estimate of body fat based on height and weight. Your caregiver can help determine your BMI, and can help you achieve or maintain a healthy weight. For adults 20 years and older:  A BMI below 18.5 is considered underweight.  A BMI of 18.5 to 24.9 is normal.  A BMI of 25 to 29.9 is considered overweight.  A BMI of 30 and above is considered obese.  Maintain normal blood lipids and cholesterol by exercising and minimizing your intake of saturated fat. Eat a balanced diet with plenty of fruits and vegetables. Blood tests for lipids and cholesterol should begin at age 20 and be repeated every 5 years. If your lipid or cholesterol levels are high, you are over 50, or you are a high risk for heart disease, you may need your cholesterol levels checked more frequently.Ongoing high lipid and cholesterol levels should be treated with medicines, if diet and exercise are not effective.  If you smoke, find out from your caregiver how to quit. If you do not use tobacco, do not start.  If you  choose to drink alcohol, do not exceed 2 drinks per day. One drink is considered to be 12 ounces (355 mL) of beer, 5 ounces (148 mL) of wine, or 1.5 ounces (44 mL) of liquor.  Avoid use of street drugs. Do not share needles with anyone. Ask for help if you need support or instructions about stopping the use of drugs.  High blood pressure causes heart disease and increases the risk of stroke. Blood pressure should be checked at least every 1 to 2 years. Ongoing high blood pressure should be treated with medicines if weight loss and exercise are not effective.  If you are 45 to 37 years old, ask your caregiver if you should take aspirin to prevent heart disease.  Diabetes screening involves taking a blood sample to check your fasting blood sugar level. This should be done once every 3 years, after age 45, if you are within normal weight and without risk factors for diabetes. Testing should be considered at a younger age or be carried out more frequently if you are overweight and have at least 1 risk factor for diabetes.  Colorectal cancer can be detected and often prevented. Most routine colorectal cancer screening begins at the age of 50 and continues through age 75. However, your caregiver may recommend screening at an earlier age if you have risk factors for colon cancer. On a yearly basis, your caregiver may provide home test kits to check for hidden blood in the stool. Use of a small camera at the end of a tube,   to directly examine the colon (sigmoidoscopy or colonoscopy), can detect the earliest forms of colorectal cancer. Talk to your caregiver about this at age 50, when routine screening begins. Direct examination of the colon should be repeated every 5 to 10 years through age 75, unless early forms of pre-cancerous polyps or small growths are found.  Hepatitis C blood testing is recommended for all people born from 1945 through 1965 and any individual with known risks for hepatitis C.  Healthy  men should no longer receive prostate-specific antigen (PSA) blood tests as part of routine cancer screening. Consult with your caregiver about prostate cancer screening.  Testicular cancer screening is not recommended for adolescents or adult males who have no symptoms. Screening includes self-exam, caregiver exam, and other screening tests. Consult with your caregiver about any symptoms you have or any concerns you have about testicular cancer.  Practice safe sex. Use condoms and avoid high-risk sexual practices to reduce the spread of sexually transmitted infections (STIs).  Use sunscreen with a sun protection factor (SPF) of 30 or greater. Apply sunscreen liberally and repeatedly throughout the day. You should seek shade when your shadow is shorter than you. Protect yourself by wearing long sleeves, pants, a wide-brimmed hat, and sunglasses year round, whenever you are outdoors.  Notify your caregiver of new moles or changes in moles, especially if there is a change in shape or color. Also notify your caregiver if a mole is larger than the size of a pencil eraser.  A one-time screening for abdominal aortic aneurysm (AAA) and surgical repair of large AAAs by sound wave imaging (ultrasonography) is recommended for ages 65 to 75 years who are current or former smokers.  Stay current with your immunizations. Document Released: 12/23/2007 Document Revised: 09/18/2011 Document Reviewed: 11/21/2010 [employer] Patient Information 2013 [employer].  

## 2012-09-02 NOTE — Assessment & Plan Note (Signed)
Will start Prozac 20 mg today Call [facility] to let them know of medication change Monitor symptoms for SI/HI Call me in 1 month to let me know how you are doing

## 2012-09-30 ENCOUNTER — Encounter: Payer: Self-pay | Admitting: Gastroenterology

## 2012-10-16 ENCOUNTER — Ambulatory Visit (INDEPENDENT_AMBULATORY_CARE_PROVIDER_SITE_OTHER): Payer: Federal, State, Local not specified - PPO | Admitting: Gastroenterology

## 2012-10-16 ENCOUNTER — Other Ambulatory Visit (INDEPENDENT_AMBULATORY_CARE_PROVIDER_SITE_OTHER): Payer: Federal, State, Local not specified - PPO

## 2012-10-16 ENCOUNTER — Encounter: Payer: Self-pay | Admitting: Gastroenterology

## 2012-10-16 VITALS — BP 112/70 | HR 71 | Ht 73.0 in | Wt 160.0 lb

## 2012-10-16 DIAGNOSIS — R634 Abnormal weight loss: Secondary | ICD-10-CM

## 2012-10-16 DIAGNOSIS — F319 Bipolar disorder, unspecified: Secondary | ICD-10-CM

## 2012-10-16 LAB — TSH: TSH: 0.38 u[IU]/mL (ref 0.35–5.50)

## 2012-10-16 NOTE — Patient Instructions (Addendum)
Your physician has requested that you go to the basement for the following lab work before leaving today: HIV, TSH, Ova and parasite stool test.  Thank you for choosing me and [facility].  Venita Lick. Pleas Koch., MD., Clementeen Graham

## 2012-10-16 NOTE — Progress Notes (Signed)
History of Present Illness: This is a 37 year old male accompanied by his wife. He relates a 55 pound weight loss over the past year. He states he feels stressed and he has multiple concerns. He has bipolar disorder and stopped taking medications one year ago. He relates that he eats a healthier diet as needed in the past and states he regularly. His wife states that he skips meals frequently. On further questioning the patient admits that frequently when he is stressed or busy he will skip meals. He has concerns that he has a tape worm or HIV causing his weight loss. He relates postnasal drip and occasional nocturnal coughing spells that lead to vomiting. He notes occasional episodes of diarrhea he relates to marijuana use. Recent blood work was unremarkable.  Review of Systems: Pertinent positive and negative review of systems were noted in the above HPI section. All other review of systems were otherwise negative.  Current Medications, Allergies, Past Medical History, Past Surgical History, Family History and Social History were reviewed in [employer] record.  Physical Exam: General: Well developed , well nourished, no acute distress Head: Normocephalic and atraumatic Eyes:  sclerae anicteric, EOMI Ears: Normal auditory acuity Mouth: No deformity or lesions Neck: Supple, no masses or thyromegaly Lungs: Clear throughout to auscultation Heart: Regular rate and rhythm; no murmurs, rubs or bruits Abdomen: Soft, non tender and non distended. No masses, hepatosplenomegaly or hernias noted. Normal Bowel sounds Musculoskeletal: Symmetrical with no gross deformities  Skin: No lesions on visible extremities Pulses:  Normal pulses noted Extremities: No clubbing, cyanosis, edema or deformities noted Neurological: Alert oriented x 4, grossly nonfocal Cervical Nodes:  No significant cervical adenopathy Inguinal Nodes: No significant inguinal adenopathy Psychological:  Alert and  cooperative. Anxious, somewhat disorganized thought pattern shifting topics frequently, concerns about parasitosis, concerns about people talking about him  Assessment and Recommendations:  1. Weight loss. Likely due to inadequate oral intake with missing meals and discontinuing psych meds. Pt unusually focused on having a tape worm, very unlikely. Obtain stool for O&P, TSH and HIV.   2. Bipolar disorder. He reports he is stressed. His thought pattern appears to be somewhat disorganized with some paranoid features. He needs further psychiatric followup and management.   3. Probably nocturnal PND leading to coughing, choking and occasional vomiting at night.

## 2012-10-22 LAB — HIV-1 RNA, QUALITATIVE, TMA: HIV-1 RNA, Qualitative, TMA: NOT DETECTED

## 2012-12-29 ENCOUNTER — Encounter (HOSPITAL_COMMUNITY): Payer: Self-pay | Admitting: *Deleted

## 2012-12-29 ENCOUNTER — Emergency Department (HOSPITAL_COMMUNITY)
Admission: EM | Admit: 2012-12-29 | Discharge: 2012-12-30 | Disposition: A | Discharge status: Other Acute Inpatient Hospital | Payer: Federal, State, Local not specified - PPO | Attending: Emergency Medicine | Admitting: Emergency Medicine

## 2012-12-29 DIAGNOSIS — Z79899 Other long term (current) drug therapy: Secondary | ICD-10-CM | POA: Insufficient documentation

## 2012-12-29 DIAGNOSIS — F411 Generalized anxiety disorder: Secondary | ICD-10-CM | POA: Insufficient documentation

## 2012-12-29 DIAGNOSIS — F319 Bipolar disorder, unspecified: Secondary | ICD-10-CM | POA: Insufficient documentation

## 2012-12-29 DIAGNOSIS — R45851 Suicidal ideations: Secondary | ICD-10-CM | POA: Insufficient documentation

## 2012-12-29 DIAGNOSIS — Z872 Personal history of diseases of the skin and subcutaneous tissue: Secondary | ICD-10-CM | POA: Insufficient documentation

## 2012-12-29 DIAGNOSIS — Z0289 Encounter for other administrative examinations: Secondary | ICD-10-CM | POA: Insufficient documentation

## 2012-12-29 DIAGNOSIS — K589 Irritable bowel syndrome without diarrhea: Secondary | ICD-10-CM | POA: Insufficient documentation

## 2012-12-29 DIAGNOSIS — F0630 Mood disorder due to known physiological condition, unspecified: Secondary | ICD-10-CM | POA: Insufficient documentation

## 2012-12-29 DIAGNOSIS — R45850 Homicidal ideations: Secondary | ICD-10-CM | POA: Insufficient documentation

## 2012-12-29 DIAGNOSIS — F17200 Nicotine dependence, unspecified, uncomplicated: Secondary | ICD-10-CM | POA: Insufficient documentation

## 2012-12-29 DIAGNOSIS — F1020 Alcohol dependence, uncomplicated: Secondary | ICD-10-CM | POA: Insufficient documentation

## 2012-12-29 DIAGNOSIS — Z98811 Dental restoration status: Secondary | ICD-10-CM | POA: Insufficient documentation

## 2012-12-29 DIAGNOSIS — E785 Hyperlipidemia, unspecified: Secondary | ICD-10-CM | POA: Insufficient documentation

## 2012-12-29 DIAGNOSIS — R1110 Vomiting, unspecified: Secondary | ICD-10-CM | POA: Insufficient documentation

## 2012-12-29 DIAGNOSIS — H5789 Other specified disorders of eye and adnexa: Secondary | ICD-10-CM | POA: Insufficient documentation

## 2012-12-29 DIAGNOSIS — E119 Type 2 diabetes mellitus without complications: Secondary | ICD-10-CM | POA: Insufficient documentation

## 2012-12-29 DIAGNOSIS — F911 Conduct disorder, childhood-onset type: Secondary | ICD-10-CM | POA: Insufficient documentation

## 2012-12-29 DIAGNOSIS — Z87442 Personal history of urinary calculi: Secondary | ICD-10-CM | POA: Insufficient documentation

## 2012-12-29 MED ORDER — ZIPRASIDONE MESYLATE 20 MG IM SOLR
10.0000 mg | Freq: Once | INTRAMUSCULAR | Status: AC
  Administered 2012-12-30: 10 mg via INTRAMUSCULAR
  Filled 2012-12-29: qty 20

## 2012-12-29 NOTE — ED Notes (Signed)
Pt states "I've had a nervous breakdown" and " I need help" "I believe I'm heading toward schizophrenia like my father." Pt states he is "trying not to kill myself like my sister." Pt is not forthcoming with regard to specifics. Pt states he lost his job and can't feed his kids. Pt admits to ETOH and marijuana daily.  Pt admits to vomiting up 6 pills that he took earlier.

## 2012-12-29 NOTE — ED Provider Notes (Signed)
History     CSN: [id number]  Arrival date & time 12/29/12  2327   None     Chief Complaint  Patient presents with  . Medical Clearance    (Consider location/radiation/quality/duration/timing/severity/associated sxs/prior treatment) HPI  Patient is a 37 yo M PMHx significant for Bipolar 1 disorder, depression, ETOH abuse, DM, Anxiety presenting to the ED for a nervous breakdown w/ suicidal ideations earlier this evening. Patient requesting help before "he becomes schizophrenic like his father" or tries to kill himself like his sister. Patient is admitting to suicidal and homicidal ideations, but is unwilling to share the plans he has. Patient states he thought about OD on "six pills" earlier, but threw them up. Patient has auditory and visual hallucinations. Admits to ETOH and marijuana use. Denies physical complaints at this time.   Past Medical History  Diagnosis Date  . Bipolar 1 disorder   . Hyperlipemia     no current med.  . Seasonal allergies   . History of kidney stones   . Wears partial dentures     upper  . Lipoma of back 05/2012    right  . Depression   . History of chicken pox   . Alcoholism   . Diabetes   . Depression   . Anxiety   . IBS (irritable bowel syndrome)     Past Surgical History  Procedure Laterality Date  . Lipoma excision  06/10/2012    Procedure: EXCISION LIPOMA;  Surgeon: Axel Filler, MD;  Location: [facility];  Service: General;  Laterality: Right;  excision of back lipoma on right 3x3cm    Family History  Problem Relation Age of Onset  . Stroke Mother   . Hypertension Mother   . Diabetes Mother   . Hypertension Father   . Diabetes Father   . Cancer Paternal Uncle     Prostate Cancer    History  Substance Use Topics  . Smoking status: Current Every Day Smoker -- 16 years    Types: Cigarettes  . Smokeless tobacco: Never Used     Comment: 1-2 cig./month  . Alcohol Use: Yes     Comment: pt states daily       Review of Systems  Respiratory: Negative for shortness of breath.   Cardiovascular: Negative for chest pain.  Gastrointestinal: Positive for vomiting.  Psychiatric/Behavioral: Positive for suicidal ideas, hallucinations and agitation. The patient is nervous/anxious.   All other systems reviewed and are negative.    Allergies  Risperidone and related  Home Medications   Current Outpatient Rx  Name  Route  Sig  Dispense  Refill  . cetirizine (ZYRTEC) 10 MG tablet   Oral   Take 1 tablet (10 mg total) by mouth daily.   30 tablet   11   . cyclobenzaprine (FLEXERIL) 10 MG tablet   Oral   Take 1 tablet (10 mg total) by mouth at bedtime.   30 tablet   0   . divalproex (DEPAKOTE) 500 MG DR tablet   Oral   Take 1,000 mg by mouth at bedtime.         Marland Kitchen FLUoxetine (PROZAC) 20 MG tablet   Oral   Take 1 tablet (20 mg total) by mouth daily.   30 tablet   3   . Multiple Vitamins-Minerals (MULTIVITAMIN WITH MINERALS) tablet   Oral   Take 1 tablet by mouth daily.         Marland Kitchen OLANZapine zydis (ZYPREXA) 10 MG disintegrating tablet  Oral   Take 10 mg by mouth at bedtime.         Marland Kitchen oxyCODONE-acetaminophen (ROXICET) 5-325 MG per tablet   Oral   Take 1 tablet by mouth every 4 (four) hours as needed for pain.   20 tablet   0   . sertraline (ZOLOFT) 50 MG tablet   Oral   Take 50 mg by mouth daily.           BP 127/75  Pulse 79  Temp(Src) 97.9 F (36.6 C) (Oral)  Resp 17  SpO2 100%  Physical Exam  Constitutional: He is oriented to person, place, and time. He appears well-developed and well-nourished.  HENT:  Head: Normocephalic and atraumatic.  Eyes: Right conjunctiva is injected. Left conjunctiva is injected.  Cardiovascular: Normal rate, regular rhythm and normal heart sounds.   Pulmonary/Chest: Effort normal and breath sounds normal.  Abdominal: Soft. Bowel sounds are normal.  Neurological: He is alert and oriented to person, place, and time.  Skin:  Skin is warm and dry.  Psychiatric: His mood appears anxious. His affect is angry. He is agitated and aggressive. He expresses homicidal and suicidal ideation.    ED Course  Procedures (including critical care time)  Labs Reviewed  CBC WITH DIFFERENTIAL - Abnormal; Notable for the following:    WBC 11.5 (*)    All other components within normal limits  URINALYSIS, ROUTINE W REFLEX MICROSCOPIC - Abnormal; Notable for the following:    Specific Gravity, Urine 1.032 (*)    Ketones, ur 15 (*)    Protein, ur 30 (*)    Leukocytes, UA SMALL (*)    All other components within normal limits  URINE RAPID DRUG SCREEN (HOSP PERFORMED) - Abnormal; Notable for the following:    Tetrahydrocannabinol POSITIVE (*)    All other components within normal limits  SALICYLATE LEVEL - Abnormal; Notable for the following:    Salicylate Lvl <2.0 (*)    All other components within normal limits  URINE CULTURE  COMPREHENSIVE METABOLIC PANEL  ACETAMINOPHEN LEVEL  ETHANOL  URINE MICROSCOPIC-ADD ON   No results found.   1. Bipolar 1 disorder       MDM  Pt presents to the ED for medical clearance.  Pt is currently having SI ideations. Pt has a plan, but does not share this plan. The patients demeanor is angry. Admits difficulty sleeping, loss of interests, feelings of worthlessness, lack of energy, difficulty concentrating, loss of appetite, feelings of anxiety.The patient currently does not have any acute physical complaints and is in no acute distress. The patients demeanor is agitated. The patient was brought to ED by self an is seeking voluntary behavioral health assistance. ACT consult was appreciated and pt was moved to Psych ED for further evaluation. Patient d/w with Dr. Estell Harpin, agrees with plan.              Jeannetta Ellis, PA-C 12/30/12 2127

## 2012-12-30 DIAGNOSIS — G40909 Epilepsy, unspecified, not intractable, without status epilepticus: Secondary | ICD-10-CM | POA: Diagnosis not present

## 2012-12-30 DIAGNOSIS — F919 Conduct disorder, unspecified: Secondary | ICD-10-CM | POA: Diagnosis not present

## 2012-12-30 DIAGNOSIS — F259 Schizoaffective disorder, unspecified: Secondary | ICD-10-CM

## 2012-12-30 LAB — ETHANOL: Alcohol, Ethyl (B): <11 mg/dL (ref 0–11)

## 2012-12-30 LAB — CBC WITH DIFFERENTIAL/PLATELET
Basophils Absolute: 0 10*3/uL (ref 0.0–0.1)
Basophils Relative: 0 % (ref 0–1)
HCT: 45.6 % (ref 39.0–52.0)
Lymphocytes Relative: 25 % (ref 12–46)
MCHC: 34.2 g/dL (ref 30.0–36.0)
Neutro Abs: 7.7 10*3/uL (ref 1.7–7.7)
Neutrophils Relative %: 67 % (ref 43–77)
Platelets: 202 10*3/uL (ref 150–400)
RDW: 12.5 % (ref 11.5–15.5)
WBC: 11.5 10*3/uL — ABNORMAL HIGH (ref 4.0–10.5)

## 2012-12-30 LAB — URINE MICROSCOPIC-ADD ON

## 2012-12-30 LAB — RAPID URINE DRUG SCREEN, HOSP PERFORMED
Amphetamines: NOT DETECTED
Benzodiazepines: NOT DETECTED
Cocaine: NOT DETECTED

## 2012-12-30 LAB — COMPREHENSIVE METABOLIC PANEL
ALT: 20 U/L (ref 0–53)
AST: 25 U/L (ref 0–37)
Albumin: 4.2 g/dL (ref 3.5–5.2)
CO2: 27 mEq/L (ref 19–32)
Chloride: 103 mEq/L (ref 96–112)
Creatinine, Ser: 0.98 mg/dL (ref 0.50–1.35)
GFR calc non Af Amer: >90 mL/min (ref >90)
Potassium: 3.7 mEq/L (ref 3.5–5.1)
Sodium: 141 mEq/L (ref 135–145)
Total Bilirubin: 0.5 mg/dL (ref 0.3–1.2)

## 2012-12-30 LAB — URINALYSIS, ROUTINE W REFLEX MICROSCOPIC
Bilirubin Urine: NEGATIVE
Glucose, UA: NEGATIVE mg/dL
Hgb urine dipstick: NEGATIVE
Protein, ur: 30 mg/dL — AB
Urobilinogen, UA: 1 mg/dL (ref 0.0–1.0)

## 2012-12-30 LAB — ACETAMINOPHEN LEVEL: Acetaminophen (Tylenol), Serum: <15 ug/mL (ref 10–30)

## 2012-12-30 MED ORDER — LORAZEPAM 1 MG PO TABS
1.0000 mg | ORAL_TABLET | Freq: Three times a day (TID) | ORAL | Status: DC | PRN
  Administered 2012-12-30: 1 mg via ORAL
  Filled 2012-12-30: qty 1

## 2012-12-30 MED ORDER — CIPROFLOXACIN HCL 500 MG PO TABS
500.0000 mg | ORAL_TABLET | Freq: Once | ORAL | Status: AC
  Administered 2012-12-30: 500 mg via ORAL
  Filled 2012-12-30: qty 1

## 2012-12-30 MED ORDER — NICOTINE 21 MG/24HR TD PT24
21.0000 mg | MEDICATED_PATCH | Freq: Every day | TRANSDERMAL | Status: DC
  Filled 2012-12-30: qty 1

## 2012-12-30 MED ORDER — CIPROFLOXACIN HCL 500 MG PO TABS
500.0000 mg | ORAL_TABLET | Freq: Two times a day (BID) | ORAL | Status: DC
  Administered 2012-12-30: 500 mg via ORAL
  Filled 2012-12-30: qty 1

## 2012-12-30 MED ORDER — IBUPROFEN 600 MG PO TABS: 600.0000 mg | ORAL_TABLET | Freq: Three times a day (TID) | ORAL | Status: DC | PRN

## 2012-12-30 MED ORDER — ONDANSETRON HCL 4 MG PO TABS: 4.0000 mg | ORAL_TABLET | Freq: Three times a day (TID) | ORAL | Status: DC | PRN

## 2012-12-30 MED ORDER — ACETAMINOPHEN 325 MG PO TABS: 650.0000 mg | ORAL_TABLET | ORAL | Status: DC | PRN

## 2012-12-30 MED ORDER — ZOLPIDEM TARTRATE 5 MG PO TABS: 5.0000 mg | ORAL_TABLET | Freq: Every evening | ORAL | Status: DC | PRN

## 2012-12-30 MED ORDER — ALUM & MAG HYDROXIDE-SIMETH 200-200-20 MG/5ML PO SUSP: 30.0000 mL | ORAL | Status: DC | PRN

## 2012-12-30 NOTE — ED Notes (Signed)
[facility] called, ETA, very soon per [facility].

## 2012-12-30 NOTE — [facility] Counselor (Signed)
Pt accepted to [facility] by Dr. Hardie Pulley to Dr. Cicero Duck. Pt will report to the (A unit). The nursing report # is [phone]. EDP-Dr. Gwyneth Sprout made aware of patient's disposition. Pt's nurse-Jennifer also made aware.

## 2012-12-30 NOTE — ED Notes (Signed)
Pt transferred from triage, SI & HI, states he feels like he is having a nervous breakdown.  Pt reports diag.with Bipolar. Hasn't taken meds in 6-7 mos.  Feeling hopeless.  Pt states he is hearing voices of his deceased sister with her telling him to join her.  Pt reports he occasionally sees his deceased sister also.  Admits to marijuana & alcohol use.  Pt cooperative but anxious at present.

## 2012-12-30 NOTE — BH Assessment (Signed)
Assessment Note   Curtis Townsend is an 37 y.o. male. Pt presents voluntarily to [facility] and states he has been dx with bipolar disorder and hasn't taken any psych meds in 6 mos. He is accompanied by his wife. Pt reports his sister committed suicide in 2006 but he goes on to say he thinks it was conspiracy perpetrated by [employer] police. Pt reports he stopped taking his meds 6 mos ago b/c he started having suicidal thoughts. Pt says he attempted suicide earlier today by walking into traffic and says he swallowed 6 pills in suicide attempt but ended up vomiting. He reports suicide attempt in 2006. He says his father has schizophrenia. Pt reports he has dx with "chemical imbalance" at age 70. He has been inpatient at [facility] in [place] several times. He reports decreased sleep and increased irritability. Pt is tangential and discusses the difficulty he and his wife have due to others' negative reactions to their interracial marriage. Pt reports getting into fights recently. He say he often hears his sister's voice who urges him to commit suicide and "join her on the other side".  Wife states she is worried pt will harm himself and that patient often says "I don't want to be here anymore". She saws pt had had "several blowups with friends". Current stressors for pt include recent job loss and financial concerns and the state of world as he raises his 37 yo and 96 yo daughters. Pt reports "shitty" mood. His affect is angry and irritable. Pt hyberverbal. He paces in room and shifts weight from one foot to another. UDS and BAL not completed at time of assessment. Pt says he smokes THC daily, approx. 2 blunts. He says he used to go to [place] but they didn't treat him well.   Axis I: Bipolar I Disorder Axis II: Deferred Axis III:  Past Medical History  Diagnosis Date  . Bipolar 1 disorder   . Hyperlipemia     no current med.  . Seasonal allergies   . History of kidney stones   . Wears partial dentures    upper  . Lipoma of back 05/2012    right  . Depression   . History of chicken pox   . Alcoholism   . Diabetes   . Depression   . Anxiety   . IBS (irritable bowel syndrome)    Axis IV: economic problems, occupational problems, other psychosocial or environmental problems and problems related to social environment Axis V: 31-40 impairment in reality testing  Past Medical History:  Past Medical History  Diagnosis Date  . Bipolar 1 disorder   . Hyperlipemia     no current med.  . Seasonal allergies   . History of kidney stones   . Wears partial dentures     upper  . Lipoma of back 05/2012    right  . Depression   . History of chicken pox   . Alcoholism   . Diabetes   . Depression   . Anxiety   . IBS (irritable bowel syndrome)     Past Surgical History  Procedure Laterality Date  . Lipoma excision  06/10/2012    Procedure: EXCISION LIPOMA;  Surgeon: Axel Filler, MD;  Location: [facility];  Service: General;  Laterality: Right;  excision of back lipoma on right 3x3cm    Family History:  Family History  Problem Relation Age of Onset  . Stroke Mother   . Hypertension Mother   . Diabetes Mother   .  Hypertension Father   . Diabetes Father   . Cancer Paternal Uncle     Prostate Cancer    Social History:  reports that he has been smoking Cigarettes.  He has been smoking about 0.00 packs per day for the past 16 years. He has never used smokeless tobacco. He reports that  drinks alcohol. He reports that he uses illicit drugs (Marijuana).  Additional Social History:  Alcohol / Drug Use Pain Medications: see PTA meds list Prescriptions: see PTA meds list - pt says he doesn't taken psych meds for past 6 mos Over the Counter: see PTA meds list History of alcohol / drug use?: Yes Substance #1 Name of Substance 1: cannabis 1 - Age of First Use: unknown 1 - Amount (size/oz): 2 blunts 1 - Frequency: daily 1 - Duration: years 1 - Last Use / Amount: 12/29/12  - 2 blunts  CIWA: CIWA-Ar BP: 133/82 mmHg Pulse Rate: 91 COWS:    Allergies:  Allergies  Allergen Reactions  . Risperidone And Related Anaphylaxis    Home Medications:  (Not in a hospital admission)  OB/GYN Status:  No LMP for male patient.  General Assessment Data Location of Assessment: [facility] ED Living Arrangements: Children;Spouse/significant other Can pt return to current living arrangement?: Yes Admission Status: Voluntary Is patient capable of signing voluntary admission?: Yes Transfer from: Home Referral Source: Self/Family/Friend  Education Status Is patient currently in school?: No  Risk to self Suicidal Ideation: Yes-Currently Present Suicidal Intent: Yes-Currently Present Is patient at risk for suicide?: Yes Suicidal Plan?: Yes-Currently Present Specify Current Suicidal Plan: to walk into traffic or take more pills as he did earlier today Access to Means: Yes Specify Access to Suicidal Means: meds, traffic What has been your use of drugs/alcohol within the last 12 months?: daily thc use Previous Attempts/Gestures: Yes How many times?: 2 (once in 2006 and once earlier tonite) Other Self Harm Risks: none Triggers for Past Attempts: Anniversary;Other (Comment) (anniversay of sister's death) Intentional Self Injurious Behavior: None Family Suicide History: Yes (sister committed suicide) Recent stressful life event(s): Financial Problems;Job Loss;Other (Comment) (racist neighbors) Persecutory voices/beliefs?: Yes Depression: Yes Depression Symptoms: Feeling angry/irritable;Insomnia Substance abuse history and/or treatment for substance abuse?: Yes Suicide prevention information given to non-admitted patients: Not applicable  Risk to Others Homicidal Ideation: No Thoughts of Harm to Others: No Current Homicidal Intent: No Current Homicidal Plan: No Access to Homicidal Means: No Identified Victim: none History of harm to others?: Yes Assessment of  Violence: None Noted Violent Behavior Description: pt sts he has been in several fights w/ friends recently Does patient have access to weapons?: No Criminal Charges Pending?: Yes Describe Pending Criminal Charges: possession of THC & speeding Does patient have a court date: Yes Court Date: 01/27/13  Psychosis Hallucinations: Auditory (hear sister's voice telling him to join her on other side) Delusions: None noted  Mental Status Report Appear/Hygiene: Disheveled Eye Contact: Good Motor Activity: Freedom of movement;Hyperactivity;Restlessness Speech: Logical/coherent;Rapid Level of Consciousness: Alert;Irritable Mood: Irritable Affect: Anxious;Irritable Anxiety Level: Moderate Thought Processes: Coherent;Relevant;Tangential Judgement: Impaired Orientation: Person;Place;Time;Situation Obsessive Compulsive Thoughts/Behaviors: None  Cognitive Functioning Concentration: Normal Memory: Recent Intact;Remote Intact IQ: Average Insight: Fair Impulse Control: Poor Appetite: Poor Weight Loss: 60 (6 mos) Weight Gain: 0 Sleep: Decreased Total Hours of Sleep: 2 Vegetative Symptoms: None  ADLScreening [facility] Assessment Services) Patient's cognitive ability adequate to safely complete daily activities?: Yes Patient able to express need for assistance with ADLs?: Yes Independently performs ADLs?: Yes (appropriate for developmental age)  Abuse/Neglect (  [facility]) Physical Abuse:  (pt declines to answer) Verbal Abuse:  (pt declines to answer) Sexual Abuse:  (pt declines to answer)  Prior Inpatient Therapy Prior Inpatient Therapy: Yes Prior Therapy Dates: several times prior to 2006 Prior Therapy Facilty/Provider(s): [facility] in [place] Reason for Treatment: bipolar disorder  Prior Outpatient Therapy Prior Outpatient Therapy: Yes Prior Therapy Dates: over 6 mos ago Prior Therapy Facilty/Provider(s): [facility] Reason for Treatment: med management  ADL Screening (condition at time of  admission) Patient's cognitive ability adequate to safely complete daily activities?: Yes Patient able to express need for assistance with ADLs?: Yes Independently performs ADLs?: Yes (appropriate for developmental age) Weakness of Legs: None Weakness of Arms/Hands: None  Home Assistive Devices/Equipment Home Assistive Devices/Equipment: None    Abuse/Neglect Assessment (Assessment to be complete while patient is alone) Physical Abuse:  (pt declines to answer) Verbal Abuse:  (pt declines to answer) Sexual Abuse:  (pt declines to answer) Exploitation of patient/patient's resources: Denies Self-Neglect: Denies     Merchant navy officer (For Healthcare) Advance Directive: Patient does not have advance directive    Additional Information 1:1 In Past 12 Months?: No CIRT Risk: No Elopement Risk: No Does patient have medical clearance?: Yes     Disposition:  Disposition Initial Assessment Completed for this Encounter: Yes Disposition of Patient: Inpatient treatment program  On Site Evaluation by:   Reviewed with Physician:     Shirlee Latch, Natilee Gauer P 12/30/2012 1:28 AM

## 2012-12-30 NOTE — ED Notes (Signed)
Report called to Windy Canny, RN  At [facility].

## 2012-12-30 NOTE — Progress Notes (Signed)
[facility] MD Progress Note  12/30/2012 12:36 PM PLATO ALSPAUGH  MRN:  [id number] Subjective:  Seen and interviewed with Dr Lolly Mustache this am.  Patient is an AA male who was brought in by wife seeking treatment and medication management for Bipolar d/o.  Patient reported he stopped taking his medications for treating bipolar d/o in the last six months and gradually became sick and irritable.  He reported feeling depressed and anxious and want to restart his medications so he can take care of his family.  Patient looked very anxious and tensed up with no physical aggression noted.  He alert and oriented x3 and cooperative with Clinical research associate and Dr Lolly Mustache.  We will admit to our inpatient unit or refer to another facility as bed becomes available. Diagnosis:   Axis I: Schizoaffective Disorder Axis II: Deferred Axis III:  Past Medical History  Diagnosis Date  . Bipolar 1 disorder   . Hyperlipemia     no current med.  . Seasonal allergies   . History of kidney stones   . Wears partial dentures     upper  . Lipoma of back 05/2012    right  . Depression   . History of chicken pox   . Alcoholism   . Diabetes   . Depression   . Anxiety   . IBS (irritable bowel syndrome)    Axis IV: other psychosocial or environmental problems and problems related to social environment Axis V: 51-60 moderate symptoms  ADL's:  Intact  Sleep: Fair  Appetite:  Fair  Suicidal Ideation:  Plan:  none Intent:  none Means:  none Homicidal Ideation:  Plan:  none Intent:  none Means:  none AEB (as evidenced by):  Psychiatric Specialty Exam: Review of Systems  Constitutional: Negative.   HENT: Negative.   Eyes: Negative.   Respiratory: Negative.   Cardiovascular: Negative.   Gastrointestinal: Negative.   Genitourinary: Negative.   Musculoskeletal: Negative.   Skin: Negative.   Neurological: Negative.   Endo/Heme/Allergies: Negative.   Psychiatric/Behavioral: Positive for depression (Reports depression 8/10.)  and hallucinations (Hearing his dead sisters voice telling him to come and join her.  Sister died  11/19/2004 after comitting suicide). Negative for suicidal ideas, memory loss and substance abuse. The patient is not nervous/anxious (Rates anxiety 7/10) and does not have insomnia.     Blood pressure 127/75, pulse 79, temperature 97.9 F (36.6 C), temperature source Oral, resp. rate 17, SpO2 100.00%.There is no weight on file to calculate BMI.  General Appearance: Casual and Fairly Groomed  Eye Contact::  Good  Speech:  Clear and Coherent  Volume:  Normal  Mood:  Anxious, Depressed, Hopeless and Irritable  Affect:  Appropriate and Congruent  Thought Process:  Coherent and Goal Directed  Orientation:  Full (Time, Place, and Person)  Thought Content:  Hallucinations: Auditory  Suicidal Thoughts:  No  Homicidal Thoughts:  No  Memory:  Immediate;   Good Recent;   Good Remote;   Good  Judgement:  Good  Insight:  Good  Psychomotor Activity:  Normal  Concentration:  Good  Recall:  Good  Akathisia:  NA  Handed:  Right  AIMS (if indicated):     Assets:  Desire for Improvement  Sleep:      Current Medications: Current Facility-Administered Medications  Medication Dose Route Frequency Provider Last Rate Last Dose  . acetaminophen (TYLENOL) tablet 650 mg  650 mg Oral Q4H PRN Jennifer L Piepenbrink, PA-C      . alum & mag  hydroxide-simeth (MAALOX/MYLANTA) 200-200-20 MG/5ML suspension 30 mL  30 mL Oral PRN Jennifer L Piepenbrink, PA-C      . ciprofloxacin (CIPRO) tablet 500 mg  500 mg Oral BID Benny Lennert, MD   500 mg at 12/30/12 0837  . ibuprofen (ADVIL,MOTRIN) tablet 600 mg  600 mg Oral Q8H PRN Jennifer L Piepenbrink, PA-C      . LORazepam (ATIVAN) tablet 1 mg  1 mg Oral Q8H PRN Lise Auer Piepenbrink, PA-C   1 mg at 12/30/12 0847  . nicotine (NICODERM CQ - dosed in mg/24 hours) patch 21 mg  21 mg Transdermal Daily Jennifer L Piepenbrink, PA-C      . ondansetron (ZOFRAN) tablet 4 mg  4 mg Oral  Q8H PRN Jennifer L Piepenbrink, PA-C      . zolpidem (AMBIEN) tablet 5 mg  5 mg Oral QHS PRN Jeannetta Ellis, PA-C       Current Outpatient Prescriptions  Medication Sig Dispense Refill  . cetirizine (ZYRTEC) 10 MG tablet Take 1 tablet (10 mg total) by mouth daily.  30 tablet  11  . cyclobenzaprine (FLEXERIL) 10 MG tablet Take 1 tablet (10 mg total) by mouth at bedtime.  30 tablet  0  . divalproex (DEPAKOTE) 500 MG DR tablet Take 1,000 mg by mouth at bedtime.      Marland Kitchen FLUoxetine (PROZAC) 20 MG tablet Take 1 tablet (20 mg total) by mouth daily.  30 tablet  3  . Multiple Vitamins-Minerals (MULTIVITAMIN WITH MINERALS) tablet Take 1 tablet by mouth daily.      Marland Kitchen OLANZapine zydis (ZYPREXA) 10 MG disintegrating tablet Take 10 mg by mouth at bedtime.      Marland Kitchen oxyCODONE-acetaminophen (ROXICET) 5-325 MG per tablet Take 1 tablet by mouth every 4 (four) hours as needed for pain.  20 tablet  0  . sertraline (ZOLOFT) 50 MG tablet Take 50 mg by mouth daily.        Lab Results:  Results for orders placed during the hospital encounter of 12/29/12 (from the past 48 hour(s))  CBC WITH DIFFERENTIAL     Status: Abnormal   Collection Time    12/30/12 12:40 AM      Result Value Range   WBC 11.5 (*) 4.0 - 10.5 K/uL   RBC 5.06  4.22 - 5.81 MIL/uL   Hemoglobin 15.6  13.0 - 17.0 g/dL   HCT 16.1  09.6 - 04.5 %   MCV 90.1  78.0 - 100.0 fL   MCH 30.8  26.0 - 34.0 pg   MCHC 34.2  30.0 - 36.0 g/dL   RDW 40.9  81.1 - 91.4 %   Platelets 202  150 - 400 K/uL   Neutrophils Relative % 67  43 - 77 %   Neutro Abs 7.7  1.7 - 7.7 K/uL   Lymphocytes Relative 25  12 - 46 %   Lymphs Abs 2.9  0.7 - 4.0 K/uL   Monocytes Relative 7  3 - 12 %   Monocytes Absolute 0.8  0.1 - 1.0 K/uL   Eosinophils Relative 0  0 - 5 %   Eosinophils Absolute 0.0  0.0 - 0.7 K/uL   Basophils Relative 0  0 - 1 %   Basophils Absolute 0.0  0.0 - 0.1 K/uL  COMPREHENSIVE METABOLIC PANEL     Status: None   Collection Time    12/30/12 12:40 AM       Result Value Range   Sodium 141  135 - 145 mEq/L  Potassium 3.7  3.5 - 5.1 mEq/L   Chloride 103  96 - 112 mEq/L   CO2 27  19 - 32 mEq/L   Glucose, Bld 89  70 - 99 mg/dL   BUN 12  6 - 23 mg/dL   Creatinine, Ser 1.61  0.50 - 1.35 mg/dL   Calcium 09.6  8.4 - 04.5 mg/dL   Total Protein 8.2  6.0 - 8.3 g/dL   Albumin 4.2  3.5 - 5.2 g/dL   AST 25  0 - 37 U/L   ALT 20  0 - 53 U/L   Alkaline Phosphatase 81  39 - 117 U/L   Total Bilirubin 0.5  0.3 - 1.2 mg/dL   GFR calc non Af Amer >90  >90 mL/min   GFR calc Af Amer >90  >90 mL/min   Comment:            The eGFR has been calculated     using the CKD EPI equation.     This calculation has not been     validated in all clinical     situations.     eGFR's persistently     <90 mL/min signify     possible Chronic Kidney Disease.  ACETAMINOPHEN LEVEL     Status: None   Collection Time    12/30/12 12:40 AM      Result Value Range   Acetaminophen (Tylenol), Serum <15.0  10 - 30 ug/mL   Comment:            THERAPEUTIC CONCENTRATIONS VARY     SIGNIFICANTLY. A RANGE OF 10-30     ug/mL MAY BE AN EFFECTIVE     CONCENTRATION FOR MANY PATIENTS.     HOWEVER, SOME ARE BEST TREATED     AT CONCENTRATIONS OUTSIDE THIS     RANGE.     ACETAMINOPHEN CONCENTRATIONS     >150 ug/mL AT 4 HOURS AFTER     INGESTION AND >50 ug/mL AT 12     HOURS AFTER INGESTION ARE     OFTEN ASSOCIATED WITH TOXIC     REACTIONS.  ETHANOL     Status: None   Collection Time    12/30/12 12:40 AM      Result Value Range   Alcohol, Ethyl (B) <11  0 - 11 mg/dL   Comment:            LOWEST DETECTABLE LIMIT FOR     SERUM ALCOHOL IS 11 mg/dL     FOR MEDICAL PURPOSES ONLY  SALICYLATE LEVEL     Status: Abnormal   Collection Time    12/30/12 12:40 AM      Result Value Range   Salicylate Lvl <2.0 (*) 2.8 - 20.0 mg/dL  URINALYSIS, ROUTINE W REFLEX MICROSCOPIC     Status: Abnormal   Collection Time    12/30/12 12:58 AM      Result Value Range   Color, Urine YELLOW  YELLOW    APPearance CLEAR  CLEAR   Specific Gravity, Urine 1.032 (*) 1.005 - 1.030   pH 7.0  5.0 - 8.0   Glucose, UA NEGATIVE  NEGATIVE mg/dL   Hgb urine dipstick NEGATIVE  NEGATIVE   Bilirubin Urine NEGATIVE  NEGATIVE   Ketones, ur 15 (*) NEGATIVE mg/dL   Protein, ur 30 (*) NEGATIVE mg/dL   Urobilinogen, UA 1.0  0.0 - 1.0 mg/dL   Nitrite NEGATIVE  NEGATIVE   Leukocytes, UA SMALL (*) NEGATIVE  URINE MICROSCOPIC-ADD ON  Status: None   Collection Time    12/30/12 12:58 AM      Result Value Range   Squamous Epithelial / LPF RARE  RARE   WBC, UA 11-20  <3 WBC/hpf   Bacteria, UA RARE  RARE   Urine-Other MUCOUS PRESENT    URINE RAPID DRUG SCREEN (HOSP PERFORMED)     Status: Abnormal   Collection Time    12/30/12  1:00 AM      Result Value Range   Opiates NONE DETECTED  NONE DETECTED   Cocaine NONE DETECTED  NONE DETECTED   Benzodiazepines NONE DETECTED  NONE DETECTED   Amphetamines NONE DETECTED  NONE DETECTED   Tetrahydrocannabinol POSITIVE (*) NONE DETECTED   Barbiturates NONE DETECTED  NONE DETECTED   Comment:            DRUG SCREEN FOR MEDICAL PURPOSES     ONLY.  IF CONFIRMATION IS NEEDED     FOR ANY PURPOSE, NOTIFY LAB     WITHIN 5 DAYS.                LOWEST DETECTABLE LIMITS     FOR URINE DRUG SCREEN     Drug Class       Cutoff (ng/mL)     Amphetamine      1000     Barbiturate      200     Benzodiazepine   200     Tricyclics       300     Opiates          300     Cocaine          300     THC              50    Physical Findings: AIMS:  , ,  ,  ,    CIWA:    COWS:     Treatment Plan Summary: Daily contact with patient to assess and evaluate symptoms and progress in treatment Medication management  Plan:  Consult and face to face interview with Dr Lolly Mustache Recommendation:  We will restart patient on his previous medications. We will admit to our inpatient unit for safety and stability when bed is available or refer out to other facilities.   Medical Decision  Making Problem Points:  Established problem, stable/improving (1) Data Points:  Review and summation of old records (2)  I certify that inpatient services furnished can reasonably be expected to improve the patient's condition.   Dahlia Byes, C   PMHNP-BC 12/30/2012, 12:36 PM I personally seen the patient agreed with the findings and involved in the treatment plan.

## 2012-12-30 NOTE — [facility] Counselor (Addendum)
Pending review at [facility] when appropriate bed available. Faxed referral to [facility]. [facility] - no adult male beds. [facility] [place] - no beds  Evette Cristal, [place] Assessment Counselor

## 2012-12-30 NOTE — ED Notes (Signed)
Report called to Latricia, RN pt to go to 60

## 2012-12-31 DIAGNOSIS — F319 Bipolar disorder, unspecified: Secondary | ICD-10-CM | POA: Diagnosis not present

## 2012-12-31 LAB — URINE CULTURE
Colony Count: NO GROWTH
Culture: NO GROWTH

## 2013-01-01 DIAGNOSIS — F319 Bipolar disorder, unspecified: Secondary | ICD-10-CM | POA: Diagnosis not present

## 2013-01-01 NOTE — ED Provider Notes (Signed)
Medical screening examination/treatment/procedure(s) were performed by non-physician practitioner and as supervising physician I was immediately available for consultation/collaboration.   Adasha Boehme L Christiona Siddique, MD 01/01/13 0726 

## 2013-01-02 DIAGNOSIS — F319 Bipolar disorder, unspecified: Secondary | ICD-10-CM | POA: Diagnosis not present

## 2013-01-03 DIAGNOSIS — F319 Bipolar disorder, unspecified: Secondary | ICD-10-CM | POA: Diagnosis not present

## 2013-01-04 DIAGNOSIS — F319 Bipolar disorder, unspecified: Secondary | ICD-10-CM | POA: Diagnosis not present

## 2013-01-05 DIAGNOSIS — F319 Bipolar disorder, unspecified: Secondary | ICD-10-CM | POA: Diagnosis not present

## 2013-01-06 DIAGNOSIS — F319 Bipolar disorder, unspecified: Secondary | ICD-10-CM | POA: Diagnosis not present

## 2013-01-07 DIAGNOSIS — F319 Bipolar disorder, unspecified: Secondary | ICD-10-CM | POA: Diagnosis not present

## 2013-02-20 DIAGNOSIS — F3163 Bipolar disorder, current episode mixed, severe, without psychotic features: Secondary | ICD-10-CM | POA: Diagnosis not present

## 2013-03-24 DIAGNOSIS — F3163 Bipolar disorder, current episode mixed, severe, without psychotic features: Secondary | ICD-10-CM | POA: Diagnosis not present

## 2013-06-06 ENCOUNTER — Ambulatory Visit: Payer: Federal, State, Local not specified - PPO | Admitting: Internal Medicine

## 2013-06-09 ENCOUNTER — Ambulatory Visit: Payer: Federal, State, Local not specified - PPO | Admitting: Internal Medicine

## 2013-06-09 DIAGNOSIS — Z0289 Encounter for other administrative examinations: Secondary | ICD-10-CM

## 2013-06-20 ENCOUNTER — Ambulatory Visit (INDEPENDENT_AMBULATORY_CARE_PROVIDER_SITE_OTHER): Payer: Federal, State, Local not specified - PPO | Admitting: Nurse Practitioner

## 2013-06-20 ENCOUNTER — Encounter: Payer: Self-pay | Admitting: Nurse Practitioner

## 2013-06-20 VITALS — BP 120/92 | HR 90 | Temp 97.8°F | Wt 176.4 lb

## 2013-06-20 DIAGNOSIS — G5620 Lesion of ulnar nerve, unspecified upper limb: Secondary | ICD-10-CM

## 2013-06-20 DIAGNOSIS — G5622 Lesion of ulnar nerve, left upper limb: Secondary | ICD-10-CM
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
## 2013-06-20 NOTE — Progress Notes (Signed)
Pre-visit discussion using our clinic review tool. No additional management support is needed unless otherwise documented below in the visit note.  

## 2013-06-20 NOTE — Patient Instructions (Signed)
Take Ibuprophen 1-2 tabs twice daily for 4-5 days, then as needed for tingling. Keep elbow extended as much as possible. Be mindful of how you sleep-keeping the elbow bent will contribute to ongoing ulnar nerve irritation. Let us know if these interventions are not helping as you may need to see a nerve or sports medicine specialist.   Ulnar Nerve Inflammation  The ulnar nerve lies near the surface of the skin as it passes by the elbow. This location causes it to be susceptible to injury and inflammation. Frequent bending of the elbow can create ulnar nerve inflammation also can be caused by direct trauma to the elbow. Ulnar nerve inflammation is characterized by pain, weakness, and loss of feeling in the hand. SYMPTOMS   Signs of nerve damage include: tingling, numbness, weakness, and/or loss of feeling in the hand, specifically the little finger and ring finger.  Sharp pains that may shoot from the elbow to the wrist and hand.  Decreased hand function.  Tenderness and/ or inflammation in the elbow.  Muscle wasting (atrophy) in the hand. CAUSES  Ulnar nerve contusions are caused by direct trauma to the elbow that results in bleeding which enters the nerve. RISK INCREASES WITH:  Contact sports (football, soccer, or rugby).  Bleeding disorders.  Taking blood thinning medicine (warfarin [Coumadin], aspirin, or nonsteroidal anti-inflammatory medications).  Diabetes mellitus.  Underactive thyroid gland (hypothyroidism). PREVENTION  Wear properly fitted and padded protective equipment.  Only take blood thinning medication when necessary. PROGNOSIS  Ulnar nerve contusions usually heal within 6 weeks. Healing often occurs spontaneously, but treatment helps reduce symptoms.  RELATED COMPLICATIONS   Permanent nerve damage, including pain, numbness, tingling, or weakness in the hand (rare).  Weak grip.  Prolonged healing time, if improperly treated or re-injured. TREATMENT    Treatment initially involves resting from any activities that aggravate the symptoms, and the use of ice and medications to help reduce pain and inflammation. The use of strengthening and stretching exercises may help reduce pain with activity. These exercises may be performed at home or with referral to a therapist. Your caregiver may recommend that you splint the elbow at night to help healing of the nerve. If symptoms persist despite conservative (non-surgical) treatment, then surgery may be recommended to free the nerve. MEDICATION   If pain medication is necessary, then nonsteroidal anti-inflammatory medications, such as aspirin and ibuprofen, or other minor pain relievers, such as acetaminophen, are often recommended.  Do not take pain medication within 7 days before surgery.  Prescription pain relievers may be given if deemed necessary by your caregiver. Use only as directed and only as much as you need. COLD THERAPY  Cold treatment (icing) relieves pain and reduces inflammation. Cold treatment should be applied for 10 to 15 minutes every 2 to 3 hours for inflammation and pain and immediately after any activity that aggravates your symptoms. Use ice packs or massage the area with a piece of ice (ice massage). SEEK MEDICAL CARE IF:   Treatment seems to offer no benefit, or the condition worsens.  Any medications produce adverse side effects. EXERCISES RANGE OF MOTION (ROM) AND STRETCHING EXERCISES - Ulnar Nerve Contusion These exercises may help you when beginning to rehabilitate your injury. Do not begin these exercises until your physician, physical therapist or athletic trainer advises you to do so. Discontinue any exercise that worsens your symptoms. Contact your physician with instructions on how to continue. Your symptoms may resolve with or without further involvement from your physician, physical  therapist or athletic trainer. While completing these exercises, remember:  Restoring  tissue flexibility helps normal motion to return to the joints. This allows healthier, less painful movement and activity.  An effective stretch should be held for at least 30 seconds.  A stretch should never be painful. You should only feel a gentle lengthening or release in the stretched tissue. RANGE OF MOTION  Extension  Hold your right / left arm at your side and straighten your elbow as far as you can using your right / left arm muscles.  Straighten the right / left elbow farther by gently pushing down on your forearm until you feel a gentle stretch on the inside of your elbow. Hold this position for __________ seconds.  Slowly return to the starting position. Repeat _____10_____ times. Complete this exercise ___2_______ times per day.  RANGE OF MOTION  Flexion  Hold your right / left arm at your side and bend your elbow as far as you can using your right / left arm muscles.  Bend the right / left elbow farther by gently pushing up on your forearm until you feel a gentle stretch on the outside of your elbow. Hold this position for __________ seconds.  Slowly return to the starting position. Repeat ___10_______ times. Complete this exercise _____2_____ times per day.  RANGE OF MOTION  Wrist Flexion, Active-Assisted  Extend your right / left elbow with your fingers pointing down.*  Gently pull the back of your hand towards you until you feel a gentle stretch on the top of your forearm.  Hold this position for __________ seconds. Repeat __________ times. Complete this exercise __________ times per day.  *If directed by your physician, physical therapist or athletic trainer, complete this stretch with your elbow bent rather than extended. RANGE OF MOTION  Wrist Extension, Active-Assisted  Extend your right / left elbow and turn your palm upwards.*  Gently pull your palm/fingertips back so your wrist extends and your fingers point more toward the ground.  You should feel a gentle  stretch on the inside of your forearm.  Hold this position for ____15______ seconds. Repeat _____10_____ times. Complete this exercise ___2_______ times per day. *If directed by your physician, physical therapist or athletic trainer, complete this stretch with your elbow bent, rather than extended. RANGE OF MOTION  Supination, Active  Stand or sit with your elbows at your side. Bend your right / left elbow to 90 degrees.  Turn your palm upward until you feel a gentle stretch on the inside of your forearm.  Hold this position for _____15_____ seconds. Slowly release and return to the starting position. Repeat _____10_____ times. Complete this stretch ___2_______ times per day.  RANGE OF MOTION  Pronation, Active  Stand or sit with your elbows at your side. Bend your right / left elbow to 90 degrees.  Turn your palm downward until you feel a gentle stretch on the top of your forearm.  Hold this position for ___15_______ seconds. Slowly release and return to the starting position. Repeat ___10_______ times. Complete this stretch ___2_______ times per day.  STRETCH - Wrist Flexion   Place the back of your right / left hand on a tabletop leaving your elbow slightly bent. Your fingers should point away from your body.  Gently press the back of your hand down onto the table by straightening your elbow. You should feel a stretch on the top of your forearm.  Hold this position for _____15_____ seconds. Repeat _____10_____ times. Complete this stretch  ___2_______ times per day.  STRETCH  Wrist Extension   Place your right / left fingertips on a tabletop leaving your elbow slightly bent. Your fingers should point backwards.  Gently press your fingers and palm down onto the table by straightening your elbow. You should feel a stretch on the inside of your forearm.  Hold this position for __________ seconds. Repeat __________ times. Complete this stretch __________ times per day.    STRENGTHENING EXERCISES - Ulnar Nerve Contusion These exercises may help you when beginning to rehabilitate your injury. Do not begin these exercises until your physician, physical therapist or athletic trainer advises you to do so. Discontinue any exercise that worsens your symptoms. Contact your physician for instructions on how to continue. They may resolve your symptoms with or without further involvement from your physician, physical therapist or athletic trainer. While completing these exercises, remember:   Muscles can gain both the endurance and the strength needed for everyday activities through controlled exercises.  Complete these exercises as instructed by your physician, physical therapist or athletic trainer. Progress with the resistance and repetition exercises only as your caregiver advises. STRENGTH Wrist Flexors  Sit with your right / left forearm palm-up and fully supported. Your elbow should be resting below the height of your shoulder. Allow your wrist to extend over the edge of the surface.  Loosely holding a _____2-5 lb_____ weight or a piece of rubber exercise band/tubing, slowly curl your hand up toward your forearm.  Hold this position for ___10_______ seconds. Slowly lower the wrist back to the starting position in a controlled manner. Repeat _____10_____ times. Complete this exercise ____2______ times per day.  STRENGTH  Wrist Extensors  Sit with your right / left forearm palm-down and fully supported. Your elbow should be resting below the height of your shoulder. Allow your wrist to extend over the edge of the surface.  Loosely holding a ____2-5 lb______ weight or a piece of rubber exercise band/tubing, slowly curl your hand up toward your forearm.  Hold this position for ___10_______ seconds. Slowly lower the wrist back to the starting position in a controlled manner. Repeat ___10_______ times. Complete this exercise _____2_____ times per day.  STRENGTH - Ulnar  Deviators  Stand with a ________2-5____________ weight in your right / left hand or sit holding on to the rubber exercise band/tubing with your opposite arm supported.  Move your wrist so that your pinkie travels toward your forearm and your thumb moves away from your forearm.  Hold this position for ____15______ seconds and then slowly lower the wrist back to the starting position. Repeat ____10______ times. Complete this exercise ____2______ times per day STRENGTH - Radial Deviators  Stand with a __________2-5__________ weight in your right / left hand or sit holding on to the rubber exercise band/tubing with your arm supported.  Raise your hand upward in front of you or pull up on the rubber tubing.  Hold this position for ____15______ seconds and then slowly lower the wrist back to the starting position. Repeat _____10_____ times. Complete this exercise _____2_____ times per day. STRENGTH - Grip  Grasp a tennis ball, a dense sponge, or a large, rolled sock in your hand.  Squeeze as hard as you can without increasing any pain.  Hold this position for ____15______ seconds. Release your grip slowly. Repeat _____10_____ times. Complete this exercise ____2______ times per day.  Document Released: 06/26/2005 Document Revised: 09/18/2011 Document Reviewed: 10/08/2008 [facility] Patient Information 2014 [place], [place].

## 2013-08-02 ENCOUNTER — Encounter (HOSPITAL_COMMUNITY): Payer: Self-pay | Admitting: Emergency Medicine

## 2013-08-02 ENCOUNTER — Emergency Department (HOSPITAL_COMMUNITY)
Admission: EM | Admit: 2013-08-02 | Discharge: 2013-08-04 | Disposition: A | Discharge status: Other Acute Inpatient Hospital | Payer: Federal, State, Local not specified - PPO | Attending: Emergency Medicine | Admitting: Emergency Medicine

## 2013-08-02 DIAGNOSIS — E119 Type 2 diabetes mellitus without complications: Secondary | ICD-10-CM | POA: Insufficient documentation

## 2013-08-02 DIAGNOSIS — T1491XA Suicide attempt, initial encounter: Secondary | ICD-10-CM

## 2013-08-02 DIAGNOSIS — Z8619 Personal history of other infectious and parasitic diseases: Secondary | ICD-10-CM | POA: Insufficient documentation

## 2013-08-02 DIAGNOSIS — F1021 Alcohol dependence, in remission: Secondary | ICD-10-CM | POA: Insufficient documentation

## 2013-08-02 DIAGNOSIS — Z872 Personal history of diseases of the skin and subcutaneous tissue: Secondary | ICD-10-CM | POA: Insufficient documentation

## 2013-08-02 DIAGNOSIS — T50992A Poisoning by other drugs, medicaments and biological substances, intentional self-harm, initial encounter: Secondary | ICD-10-CM | POA: Insufficient documentation

## 2013-08-02 DIAGNOSIS — F319 Bipolar disorder, unspecified: Secondary | ICD-10-CM | POA: Insufficient documentation

## 2013-08-02 DIAGNOSIS — T48204A Poisoning by unspecified drugs acting on muscles, undetermined, initial encounter: Secondary | ICD-10-CM | POA: Insufficient documentation

## 2013-08-02 DIAGNOSIS — T43502A Poisoning by unspecified antipsychotics and neuroleptics, intentional self-harm, initial encounter: Secondary | ICD-10-CM | POA: Insufficient documentation

## 2013-08-02 DIAGNOSIS — F1210 Cannabis abuse, uncomplicated: Secondary | ICD-10-CM | POA: Insufficient documentation

## 2013-08-02 DIAGNOSIS — T50901A Poisoning by unspecified drugs, medicaments and biological substances, accidental (unintentional), initial encounter: Secondary | ICD-10-CM

## 2013-08-02 DIAGNOSIS — F411 Generalized anxiety disorder: Secondary | ICD-10-CM | POA: Insufficient documentation

## 2013-08-02 DIAGNOSIS — Z87442 Personal history of urinary calculi: Secondary | ICD-10-CM | POA: Insufficient documentation

## 2013-08-02 DIAGNOSIS — Z8719 Personal history of other diseases of the digestive system: Secondary | ICD-10-CM | POA: Insufficient documentation

## 2013-08-02 DIAGNOSIS — T424X4A Poisoning by benzodiazepines, undetermined, initial encounter: Secondary | ICD-10-CM | POA: Insufficient documentation

## 2013-08-02 DIAGNOSIS — F17200 Nicotine dependence, unspecified, uncomplicated: Secondary | ICD-10-CM | POA: Insufficient documentation

## 2013-08-02 DIAGNOSIS — T438X2A Poisoning by other psychotropic drugs, intentional self-harm, initial encounter: Secondary | ICD-10-CM

## 2013-08-02 DIAGNOSIS — T43294A Poisoning by other antidepressants, undetermined, initial encounter: Secondary | ICD-10-CM | POA: Insufficient documentation

## 2013-08-02 LAB — URINALYSIS, ROUTINE W REFLEX MICROSCOPIC
Bilirubin Urine: NEGATIVE
Glucose, UA: NEGATIVE mg/dL
Hgb urine dipstick: NEGATIVE
Ketones, ur: 15 mg/dL — AB
Nitrite: NEGATIVE
PROTEIN: 30 mg/dL — AB
Specific Gravity, Urine: 1.024 (ref 1.005–1.030)
UROBILINOGEN UA: 1 mg/dL (ref 0.0–1.0)
pH: 8 (ref 5.0–8.0)

## 2013-08-02 LAB — RAPID URINE DRUG SCREEN, HOSP PERFORMED
Amphetamines: NOT DETECTED
BARBITURATES: NOT DETECTED
Benzodiazepines: POSITIVE — AB
Cocaine: NOT DETECTED
Opiates: NOT DETECTED
Tetrahydrocannabinol: POSITIVE — AB

## 2013-08-02 LAB — COMPREHENSIVE METABOLIC PANEL
ALT: 15 U/L (ref 0–53)
AST: 19 U/L (ref 0–37)
Albumin: 4.2 g/dL (ref 3.5–5.2)
Alkaline Phosphatase: 61 U/L (ref 39–117)
BILIRUBIN TOTAL: 0.8 mg/dL (ref 0.3–1.2)
BUN: 7 mg/dL (ref 6–23)
CHLORIDE: 107 meq/L (ref 96–112)
CO2: 24 mEq/L (ref 19–32)
CREATININE: 1.04 mg/dL (ref 0.50–1.35)
Calcium: 9.3 mg/dL (ref 8.4–10.5)
GFR calc Af Amer: >90 mL/min (ref >90)
Glucose, Bld: 90 mg/dL (ref 70–99)
Potassium: 3.7 mEq/L (ref 3.7–5.3)
Sodium: 146 mEq/L (ref 137–147)
Total Protein: 7.2 g/dL (ref 6.0–8.3)

## 2013-08-02 LAB — GLUCOSE, CAPILLARY: Glucose-Capillary: 91 mg/dL (ref 70–99)

## 2013-08-02 LAB — CBC WITH DIFFERENTIAL/PLATELET
BASOS ABS: 0 10*3/uL (ref 0.0–0.1)
Basophils Relative: 0 % (ref 0–1)
Eosinophils Absolute: 0 10*3/uL (ref 0.0–0.7)
Eosinophils Relative: 0 % (ref 0–5)
HEMATOCRIT: 43.4 % (ref 39.0–52.0)
Hemoglobin: 15.1 g/dL (ref 13.0–17.0)
LYMPHS ABS: 1.9 10*3/uL (ref 0.7–4.0)
LYMPHS PCT: 31 % (ref 12–46)
MCH: 31.6 pg (ref 26.0–34.0)
MCHC: 34.8 g/dL (ref 30.0–36.0)
MCV: 90.8 fL (ref 78.0–100.0)
MONO ABS: 0.5 10*3/uL (ref 0.1–1.0)
Monocytes Relative: 8 % (ref 3–12)
NEUTROS ABS: 3.9 10*3/uL (ref 1.7–7.7)
Neutrophils Relative %: 61 % (ref 43–77)
Platelets: 178 10*3/uL (ref 150–400)
RBC: 4.78 MIL/uL (ref 4.22–5.81)
RDW: 12.3 % (ref 11.5–15.5)
WBC: 6.3 10*3/uL (ref 4.0–10.5)

## 2013-08-02 LAB — ETHANOL: Alcohol, Ethyl (B): <11 mg/dL (ref 0–11)

## 2013-08-02 LAB — URINE MICROSCOPIC-ADD ON

## 2013-08-02 LAB — SALICYLATE LEVEL: Salicylate Lvl: <2 mg/dL — ABNORMAL LOW (ref 2.8–20.0)

## 2013-08-02 LAB — ACETAMINOPHEN LEVEL: Acetaminophen (Tylenol), Serum: <15 ug/mL (ref 10–30)

## 2013-08-02 MED ORDER — ALUM & MAG HYDROXIDE-SIMETH 200-200-20 MG/5ML PO SUSP: 30.0000 mL | ORAL | Status: DC | PRN

## 2013-08-02 MED ORDER — SODIUM CHLORIDE 0.9 % IV SOLN
1000.0000 mL | Freq: Once | INTRAVENOUS | Status: AC
  Administered 2013-08-02: 1000 mL via INTRAVENOUS

## 2013-08-02 MED ORDER — SODIUM CHLORIDE 0.9 % IV SOLN: INTRAVENOUS | Status: DC

## 2013-08-02 MED ORDER — IBUPROFEN 200 MG PO TABS: 600.0000 mg | ORAL_TABLET | Freq: Three times a day (TID) | ORAL | Status: DC | PRN

## 2013-08-02 MED ORDER — ONDANSETRON HCL 4 MG/2ML IJ SOLN
4.0000 mg | Freq: Once | INTRAMUSCULAR | Status: AC
  Administered 2013-08-02: 4 mg via INTRAVENOUS
  Filled 2013-08-02: qty 2

## 2013-08-02 MED ORDER — SODIUM CHLORIDE 0.9 % IV SOLN: 1000.0000 mL | INTRAVENOUS | Status: DC

## 2013-08-02 MED ORDER — ONDANSETRON HCL 4 MG PO TABS: 4.0000 mg | ORAL_TABLET | Freq: Three times a day (TID) | ORAL | Status: DC | PRN

## 2013-08-02 MED ORDER — ZOLPIDEM TARTRATE 5 MG PO TABS
5.0000 mg | ORAL_TABLET | Freq: Every evening | ORAL | Status: DC | PRN
  Administered 2013-08-02: 5 mg via ORAL
  Filled 2013-08-02: qty 1

## 2013-08-02 NOTE — Progress Notes (Signed)
Per, NP Manus Gunning)  patient meets criteria for inpatient hospitalization.  Patient is being reviewed by the [facility] for hospitalization at [facility].

## 2013-08-02 NOTE — ED Notes (Addendum)
Pt from home. Called EMS for intentional overdose on valium and sertraline, unsure on amount. Pt lethargic, drooling on arrival. Pt confused. EMs gave 4mg  zofran IV. Unsure on time of time of ingestion. Family called after pt threw up. MD at bedside. Clothing cut off pt for examination.

## 2013-08-02 NOTE — BH Assessment (Signed)
Assessment Note  Patient is a 38 year old African American male that was brought to the ER due to an intentional overdose of his 50mg  Zoloft (approximating #25), #1 5mg  Valium, and "handful" of muscle relaxers.  Patient is not able to contract for safety.    Patient was previously diagnosed with Bipolar 1 Disorder.  Patient reports a family history of mental illness and suicide.  Patient reports a prior psychiatric hospitalization in July 2014 for a suicide attempt after he lost his job.   Patient receives outpatient therapy and medication management from [facility].  Patient reports that he is not compliant with taking his medication.   Patient reports increased feelings of depression, hopelessness and despair.  Patient reports that a friend recently died and that has caused him to think about his sister that committed suicide in 2005.     Axis I: Bipolar, Depressed Axis II: Deferred Axis III:  Past Medical History  Diagnosis Date  . Bipolar 1 disorder   . Hyperlipemia     no current med.  . Seasonal allergies   . History of kidney stones   . Wears partial dentures     upper  . Lipoma of back 05/2012    right  . Depression   . History of chicken pox   . Alcoholism   . Diabetes   . Depression   . Anxiety   . IBS (irritable bowel syndrome)    Axis IV: economic problems, occupational problems, other psychosocial or environmental problems, problems related to social environment and problems with primary support group Axis V: 31-40 impairment in reality testing  Past Medical History:  Past Medical History  Diagnosis Date  . Bipolar 1 disorder   . Hyperlipemia     no current med.  . Seasonal allergies   . History of kidney stones   . Wears partial dentures     upper  . Lipoma of back 05/2012    right  . Depression   . History of chicken pox   . Alcoholism   . Diabetes   . Depression   . Anxiety   . IBS (irritable bowel syndrome)     Past Surgical History  Procedure  Laterality Date  . Lipoma excision  06/10/2012    Procedure: EXCISION LIPOMA;  Surgeon: Ralene Ok, MD;  Location: [place];  Service: General;  Laterality: Right;  excision of back lipoma on right 3x3cm    Family History:  Family History  Problem Relation Age of Onset  . Stroke Mother   . Hypertension Mother   . Diabetes Mother   . Hypertension Father   . Diabetes Father   . Cancer Paternal Uncle     Prostate Cancer    Social History:  reports that he has been smoking Cigarettes.  He has been smoking about 0.00 packs per day for the past 16 years. He has never used smokeless tobacco. He reports that he drinks alcohol. He reports that he uses illicit drugs (Marijuana).  Additional Social History:     CIWA: CIWA-Ar BP: 126/73 mmHg Pulse Rate: 77 COWS:    Allergies:  Allergies  Allergen Reactions  . Risperidone And Related Anaphylaxis    Home Medications:  (Not in a hospital admission)  OB/GYN Status:  No LMP for male patient.  General Assessment Data Location of Assessment: [facility] ED Is this a Tele or Face-to-Face Assessment?: Face-to-Face Is this an Initial Assessment or a Re-assessment for this encounter?: Initial Assessment Living Arrangements:  Children;Spouse/significant other Can pt return to current living arrangement?: Yes Admission Status: Voluntary Is patient capable of signing voluntary admission?: Yes Transfer from: [facility] Referral Source: Self/Family/Friend  Medical Screening Exam (Halfway) Medical Exam completed: Yes  [facility] Living Arrangements: Children;Spouse/significant other Name of Psychiatrist: Beverly Sessions Name of Therapist: [facility]  Education Status Is patient currently in school?: No Current Grade: NA Highest grade of school patient has completed: NA Name of school: NA Contact person: NA  Risk to self Suicidal Ideation: Yes-Currently Present Suicidal Intent: Yes-Currently Present Is  patient at risk for suicide?: Yes Suicidal Plan?: Yes-Currently Present Specify Current Suicidal Plan: Overdose on pills Access to Means: Yes Specify Access to Suicidal Means: Pills What has been your use of drugs/alcohol within the last 12 months?: Alcohol  Previous Attempts/Gestures: Yes How many times?: 1 Other Self Harm Risks: None Triggers for Past Attempts: Family contact;Spouse contact;Unpredictable Intentional Self Injurious Behavior: None Family Suicide History: Yes (Sister in 2005) Recent stressful life event(s): Job Loss;Financial Problems;Trauma (Comment) (11-2012 Lost Job; Several deaths of family members; Sister su) Persecutory voices/beliefs?: No Depression: Yes Depression Symptoms: Despondent;Insomnia;Tearfulness;Isolating;Fatigue;Guilt;Loss of interest in usual pleasures;Feeling worthless/self pity;Feeling angry/irritable Substance abuse history and/or treatment for substance abuse?: Yes Suicide prevention information given to non-admitted patients: Yes  Risk to Others Homicidal Ideation: No Thoughts of Harm to Others: No Current Homicidal Intent: No Current Homicidal Plan: No Access to Homicidal Means: No Identified Victim: None  History of harm to others?: No Assessment of Violence: None Noted Violent Behavior Description: Calm Does patient have access to weapons?: No Criminal Charges Pending?: No Does patient have a court date: No  Psychosis Hallucinations: None noted Delusions: None noted  Mental Status Report Appear/Hygiene: Disheveled Eye Contact: Poor Motor Activity: Freedom of movement;Unremarkable Speech: Logical/coherent Level of Consciousness: Alert Mood: Depressed Affect: Blunted Anxiety Level: None Thought Processes: Coherent;Relevant Judgement: Unimpaired Orientation: Person;Place;Time;Situation Obsessive Compulsive Thoughts/Behaviors: None  Cognitive Functioning Concentration: Decreased Memory: Recent Intact;Remote Intact IQ:  Average Insight: Poor Impulse Control: Poor Appetite: Fair Weight Loss: 0 Weight Gain: 0 Sleep: Decreased Total Hours of Sleep: 3 Vegetative Symptoms: Decreased grooming;Staying in bed  ADLScreening [facility] Assessment Services) Patient's cognitive ability adequate to safely complete daily activities?: Yes Patient able to express need for assistance with ADLs?: Yes Independently performs ADLs?: Yes (appropriate for developmental age)  Prior Inpatient Therapy Prior Inpatient Therapy: Yes Prior Therapy Dates: July 2014 Prior Therapy Facilty/Provider(s): Unable to remember the name of the hospital  Reason for Treatment: SI ande depression   Prior Outpatient Therapy Prior Outpatient Therapy: Yes Prior Therapy Dates: Ongoing  Prior Therapy Facilty/Provider(s): [facility] Reason for Treatment: Medication Management nad OPT   ADL Screening (condition at time of admission) Patient's cognitive ability adequate to safely complete daily activities?: Yes Patient able to express need for assistance with ADLs?: Yes Independently performs ADLs?: Yes (appropriate for developmental age)                  Additional Information 1:1 In Past 12 Months?: No CIRT Risk: No Elopement Risk: No Does patient have medical clearance?: Yes     Disposition: Inpatient hospitalization.  Per Manus Gunning, NP.  Patient meets criteria for inpatient hospitalization.  Patient is being reviewed by the [facility] for hospitalization at [facility].  Disposition Initial Assessment Completed for this Encounter: Yes Disposition of Patient: Inpatient treatment program Type of inpatient treatment program: Adult  On Site Evaluation by:   Reviewed with Physician:    Graciella Freer [place] 08/02/2013 9:14 PM

## 2013-08-02 NOTE — ED Notes (Signed)
Bed: WA19 Expected date:  Expected time:  Means of arrival:  Comments: EMS 

## 2013-08-02 NOTE — ED Provider Notes (Signed)
CSN: [id number]     Arrival date & time 08/02/13  1547 History   First MD Initiated Contact with Patient 08/02/13 [phone]     Chief Complaint  Patient presents with  . Ingestion   (Consider location/radiation/quality/duration/timing/severity/associated sxs/prior Treatment) Patient is a 38 y.o. male presenting with Overdose. The history is provided by the patient and the EMS personnel. No language interpreter was used.  Drug Overdose This is a new problem. Episode onset: unsure. The problem occurs constantly. The problem has not changed since onset.Pertinent negatives include no chest pain, no abdominal pain, no headaches and no shortness of breath. Associated symptoms comments: Nausea, vomiting, fatigue. Nothing aggravates the symptoms. Nothing relieves the symptoms. Treatments tried: IVF, antiemetics. The treatment provided no relief.    Past Medical History  Diagnosis Date  . Bipolar 1 disorder   . Hyperlipemia     no current med.  . Seasonal allergies   . History of kidney stones   . Wears partial dentures     upper  . Lipoma of back 05/2012    right  . Depression   . History of chicken pox   . Alcoholism   . Diabetes   . Depression   . Anxiety   . IBS (irritable bowel syndrome)    Past Surgical History  Procedure Laterality Date  . Lipoma excision  06/10/2012    Procedure: EXCISION LIPOMA;  Surgeon: Ralene Ok, MD;  Location: [facility];  Service: General;  Laterality: Right;  excision of back lipoma on right 3x3cm   Family History  Problem Relation Age of Onset  . Stroke Mother   . Hypertension Mother   . Diabetes Mother   . Hypertension Father   . Diabetes Father   . Cancer Paternal Uncle     Prostate Cancer   History  Substance Use Topics  . Smoking status: Current Every Day Smoker -- 16 years    Types: Cigarettes  . Smokeless tobacco: Never Used     Comment: 1-2 cig./month  . Alcohol Use: Yes     Comment: pt states daily    Review of  Systems  Constitutional: Positive for activity change. Negative for fever, appetite change and fatigue.  HENT: Negative for congestion, facial swelling, rhinorrhea and trouble swallowing.   Eyes: Negative for photophobia and pain.  Respiratory: Negative for cough, chest tightness and shortness of breath.   Cardiovascular: Negative for chest pain and leg swelling.  Gastrointestinal: Positive for nausea and vomiting. Negative for abdominal pain, diarrhea and constipation.  Endocrine: Negative for polydipsia and polyuria.  Genitourinary: Negative for dysuria, urgency, decreased urine volume and difficulty urinating.  Musculoskeletal: Negative for back pain and gait problem.  Skin: Negative for color change, rash and wound.  Allergic/Immunologic: Negative for immunocompromised state.  Neurological: Negative for dizziness, facial asymmetry, speech difficulty, weakness, numbness and headaches.  Psychiatric/Behavioral: Positive for suicidal ideas and behavioral problems. Negative for confusion, decreased concentration and agitation.    Allergies  Risperidone and related  Home Medications   Current Outpatient Rx  Name  Route  Sig  Dispense  Refill  . divalproex (DEPAKOTE) 500 MG DR tablet   Oral   Take 1,000 mg by mouth at bedtime.         Marland Kitchen FLUoxetine (PROZAC) 20 MG tablet   Oral   Take 1 tablet (20 mg total) by mouth daily.   30 tablet   3   . metoCLOPramide (REGLAN) 10 MG tablet   Oral  Take 10 mg by mouth every 6 (six) hours as needed for nausea (nausea).         . Multiple Vitamins-Minerals (MULTIVITAMIN WITH MINERALS) tablet   Oral   Take 1 tablet by mouth daily.         Marland Kitchen OLANZapine zydis (ZYPREXA) 10 MG disintegrating tablet   Oral   Take 10 mg by mouth at bedtime.         . sertraline (ZOLOFT) 50 MG tablet   Oral   Take 50 mg by mouth daily.          BP 126/73  Pulse 77  Temp(Src) 98.1 F (36.7 C) (Oral)  Resp 16  SpO2 97% Physical Exam   Constitutional: He is oriented to person, place, and time. He appears well-developed and well-nourished. He appears listless. No distress.  HENT:  Head: Normocephalic and atraumatic.  Mouth/Throat: No oropharyngeal exudate.  Eyes: Pupils are equal, round, and reactive to light.  Neck: Normal range of motion. Neck supple.  Cardiovascular: Normal rate, regular rhythm and normal heart sounds.  Exam reveals no gallop and no friction rub.   No murmur heard. Pulmonary/Chest: Effort normal and breath sounds normal. No respiratory distress. He has no wheezes. He has no rales.  Abdominal: Soft. Bowel sounds are normal. He exhibits no distension and no mass. There is no tenderness. There is no rebound and no guarding.  Musculoskeletal: Normal range of motion. He exhibits no edema and no tenderness.  Neurological: He is oriented to person, place, and time. He has normal strength. He appears listless. No cranial nerve deficit or sensory deficit. He exhibits normal muscle tone. Coordination normal. GCS eye subscore is 4. GCS verbal subscore is 4. GCS motor subscore is 6.  Skin: Skin is warm and dry.  Psychiatric: He has a normal mood and affect.    ED Course  Procedures (including critical care time) Labs Review Labs Reviewed  URINE RAPID DRUG SCREEN (HOSP PERFORMED) - Abnormal; Notable for the following:    Benzodiazepines POSITIVE (*)    Tetrahydrocannabinol POSITIVE (*)    All other components within normal limits  SALICYLATE LEVEL - Abnormal; Notable for the following:    Salicylate Lvl 123456 (*)    All other components within normal limits  URINALYSIS, ROUTINE W REFLEX MICROSCOPIC - Abnormal; Notable for the following:    APPearance CLOUDY (*)    Ketones, ur 15 (*)    Protein, ur 30 (*)    Leukocytes, UA MODERATE (*)    All other components within normal limits  URINE MICROSCOPIC-ADD ON - Abnormal; Notable for the following:    Crystals CA OXALATE CRYSTALS (*)    All other components  within normal limits  ACETAMINOPHEN LEVEL  CBC WITH DIFFERENTIAL  COMPREHENSIVE METABOLIC PANEL  ETHANOL  GLUCOSE, CAPILLARY  ACETAMINOPHEN LEVEL   Imaging Review No results found.  EKG Interpretation    Date/Time:  Saturday August 02 2013 15:55:28 EST Ventricular Rate:  83 PR Interval:  141 QRS Duration: 76 QT Interval:  340 QTC Calculation: 399 R Axis:   109 Text Interpretation:  Sinus rhythm Probable left atrial enlargement No prior for comparison Confirmed by Jani Ploeger  MD, Declyn Delsol ([id number]) on 08/02/2013 4:07:07 PM            MDM   1. Suicide attempt   2. Overdose   3. Bipolar disorder, unspecified    Pt is a 38 y.o. male with Pmhx as above who presents with intentional OD/suicice attempt at unclear  time this afternoon. He reports having "all" of his 50mg  zoloft (appoximating #25), #1 5mg  valium, and "handful" on a muscle relaxer that is not no his med list.  GCS 14, no focal neuro findings. He is protecting his airway, though is sleepy. He has vomited several times. I do not feel he would tolerate charcoal and again has unclear ingestion time. EKG w/ nml intervals.    6:31 PM Pt now awake, alert, states he took above meds a 2pm and feels mentally & physically sick. Pt ambulated in dept.  UDS 1/ THC, benzos. Neg ETOH, salicylates, tylenol. Wife called, stated he as been on a "down" lately and a friend recently died which she thinks was the trigger to today's actions.  Will consult psych. I believe pt requires inpt psychiatric care.    WIFESEMAJE KINKER, [phone]  Neta Ehlers, MD 08/03/13 [phone]

## 2013-08-02 NOTE — ED Notes (Signed)
Family member took cell phone home. Medications sent to pharmacy. No other belongings in ED.

## 2013-08-02 NOTE — ED Notes (Signed)
Pt ambulatory to bathroom

## 2013-08-02 NOTE — ED Notes (Signed)
Pt became upset after family told pharmacy tech that sister had committed suicide. Pt began yelling that he wanted to die. Pt calmed down. Resting at present

## 2013-08-03 ENCOUNTER — Encounter (HOSPITAL_COMMUNITY): Payer: Self-pay | Admitting: Registered Nurse

## 2013-08-03 DIAGNOSIS — F319 Bipolar disorder, unspecified: Secondary | ICD-10-CM

## 2013-08-03 DIAGNOSIS — X838XXA Intentional self-harm by other specified means, initial encounter: Secondary | ICD-10-CM

## 2013-08-03 MED ORDER — DIPHENHYDRAMINE HCL 50 MG/ML IJ SOLN
50.0000 mg | Freq: Once | INTRAMUSCULAR | Status: AC
  Administered 2013-08-03: 50 mg via INTRAMUSCULAR
  Filled 2013-08-03: qty 1

## 2013-08-03 MED ORDER — SULFAMETHOXAZOLE-TMP DS 800-160 MG PO TABS
1.0000 | ORAL_TABLET | Freq: Two times a day (BID) | ORAL | Status: DC
  Administered 2013-08-03 – 2013-08-04 (×2): 1 via ORAL
  Filled 2013-08-03 (×2): qty 1

## 2013-08-03 MED ORDER — LORAZEPAM 2 MG/ML IJ SOLN
2.0000 mg | Freq: Once | INTRAMUSCULAR | Status: AC
  Administered 2013-08-03: 2 mg via INTRAMUSCULAR
  Filled 2013-08-03: qty 1

## 2013-08-03 NOTE — Progress Notes (Signed)
The following facilities have been contacted regarding bed availability:  [place]- per Johnney Ou at [place] per [facility] no beds available [facility]- per Myriam Jacobson at [facility]- per [place] can fax, declined [facility]- per [place] at [facility]- per [place] no beds [facility]- per Butch Penny at [employer]- per [facility] at capacity, may have beds tomorrow Mon 1.26.15 [facility]- per Kasandra Knudsen low acuity only d/t to staffing [facility]- per [facility] Roch no beds, wait list only [facility]- per Minna Merritts at [facility]- no answer [place]- per Roderic [place] "a few beds available", referral faxed [place]- per Daine Floras at [place] per Lattie Haw at [facility]- per [place] at [facility]- per [place] can fax if no [place] or [facility] [employer]- per [place] at [facility]- per [facility] no beds available [facility] [employer] [facility]- per [place] no beds available [facility]- per [place] child beds available only [facility]- per Helene [facility] no beds available [facility]- per [place] low acuity and child beds only, referral faxed [place]- per [place] no beds available [place]- per Dolores Lory only if voluntary, referral faxed [facility]- per Wells Guiles at [place] per [place] no beds available   [employer] Disposition [facility]HT

## 2013-08-03 NOTE — BH Assessment (Signed)
Per Okey Regal, [facility] at [facility], adult unit is at capacity. Contacted the following facilities for placement:  [facility]: At capacity [facility]: At [place]: No answer [facility]: At [facility]: At [facility]: At [place]: At [place]: At [place]: At [place]: At [place]: Bed available. Faxed clinical information [facility]: Bed available. Faxed clinical information   Orpah Greek [facility], [facility] Triage Specialist

## 2013-08-03 NOTE — Progress Notes (Signed)
Patient ID: Curtis Townsend, male   DOB: March 21, 1976, 38 y.o.   MRN: [id number]  Urinalysis from 08/02/13 reviewed. Urinalysis shows moderate leukocytes, Protein 30 mg/dL, Ca oxalate crystals, mucous and WBC 11-20.  Plan:  Will start Bactrim DS 1 tab PO BID x 3 days.    Serena Colonel, FNP-BC I have personally seen the patient and agreed with the findings and involved in the treatment plan. Berniece Andreas, MD

## 2013-08-03 NOTE — BH Assessment (Signed)
Curtis Townsend Assessment Progress Note  Called [name], left message regarding bed availablility @ 1055.  [name], MS, [facility] Licensed Professional Counselor Triage Specialist

## 2013-08-03 NOTE — ED Notes (Signed)
[facility] requested to [employer].  Pt is now IVC.

## 2013-08-03 NOTE — Consult Note (Signed)
[facility] Face-to-Face Psychiatry Consult   Reason for Consult:  Overdose Referring Physician:  EDP  Curtis Townsend is an 38 y.o. male.  Assessment: AXIS I:  Bipolar, Depressed AXIS II:  Deferred AXIS III:   Past Medical History  Diagnosis Date  . Bipolar 1 disorder   . Hyperlipemia     no current med.  . Seasonal allergies   . History of kidney stones   . Wears partial dentures     upper  . Lipoma of back 05/2012    right  . Depression   . History of chicken pox   . Alcoholism   . Diabetes   . Depression   . Anxiety   . IBS (irritable bowel syndrome)    AXIS IV:  other psychosocial or environmental problems, problems related to social environment and problems with primary support group AXIS V:  1-10 persistent dangerousness to self and others present  Plan:  Recommend psychiatric Inpatient admission when medically cleared.  Subjective:   Curtis Townsend is a 38 y.o. male patient admitted with overdose on his Zoloft, Valium and muscle relaxant.  HPI:  Patient seen chart reviewed.  The patient is a 38 year old African American man who was brought to the emergency room after an intentional overdose on his Zoloft Valium and muscle relaxants.  Patient has history of bipolar disorder.  He admitted to sometimes not taking his medication.  He endorsed multiple stressors.  He is complaining of poor sleep, having dreams and seeing his mother-in-law who is deceased asking her to leave her daughter alone.  He endorse feeling of hopelessness, helplessness and lack of motivation and anhedonia.  He also endorse irritability anger and having relationship issues with family members.  He endorse hallucination, paranoia and persistent suicidal thoughts and cannot contract for safety.  Patient requires inpatient psychiatric services. HPI Elements:   Location:  Patient has significant symptoms of depression and paranoia.. Quality:  Unable to function. Severity:  Moderate. Timing:  2-3  weeks.  Past Psychiatric History: Past Medical History  Diagnosis Date  . Bipolar 1 disorder   . Hyperlipemia     no current med.  . Seasonal allergies   . History of kidney stones   . Wears partial dentures     upper  . Lipoma of back 05/2012    right  . Depression   . History of chicken pox   . Alcoholism   . Diabetes   . Depression   . Anxiety   . IBS (irritable bowel syndrome)     reports that he has been smoking Cigarettes.  He has been smoking about 0.00 packs per day for the past 16 years. He has never used smokeless tobacco. He reports that he drinks alcohol. He reports that he uses illicit drugs (Marijuana). Family History  Problem Relation Age of Onset  . Stroke Mother   . Hypertension Mother   . Diabetes Mother   . Hypertension Father   . Diabetes Father   . Cancer Paternal Uncle     Prostate Cancer   Family History Substance Abuse: Yes, Describe: Family Supports: Yes, List: Living Arrangements: Children;Spouse/significant other Can pt return to current living arrangement?: Yes   Allergies:   Allergies  Allergen Reactions  . Risperidone And Related Anaphylaxis    ACT Assessment Complete:  Yes:    Educational Status    Risk to Self: Risk to self Suicidal Ideation: Yes-Currently Present Suicidal Intent: Yes-Currently Present Is patient at risk for suicide?: Yes  Suicidal Plan?: Yes-Currently Present Specify Current Suicidal Plan: Overdose on pills Access to Means: Yes Specify Access to Suicidal Means: Pills What has been your use of drugs/alcohol within the last 12 months?: Alcohol  Previous Attempts/Gestures: Yes How many times?: 1 Other Self Harm Risks: None Triggers for Past Attempts: Family contact;Spouse contact;Unpredictable Intentional Self Injurious Behavior: None Family Suicide History: Yes (Sister in 2005) Recent stressful life event(s): Job Loss;Financial Problems;Trauma (Comment) (11-2012 Lost Job; Several deaths of family members;  Sister su) Persecutory voices/beliefs?: No Depression: Yes Depression Symptoms: Despondent;Insomnia;Tearfulness;Isolating;Fatigue;Guilt;Loss of interest in usual pleasures;Feeling worthless/self pity;Feeling angry/irritable Substance abuse history and/or treatment for substance abuse?: Yes Suicide prevention information given to non-admitted patients: Yes  Risk to Others: Risk to Others Homicidal Ideation: No Thoughts of Harm to Others: No Current Homicidal Intent: No Current Homicidal Plan: No Access to Homicidal Means: No Identified Victim: None  History of harm to others?: No Assessment of Violence: None Noted Violent Behavior Description: Calm Does patient have access to weapons?: No Criminal Charges Pending?: No Does patient have a court date: No  Abuse:    Prior Inpatient Therapy: Prior Inpatient Therapy Prior Inpatient Therapy: Yes Prior Therapy Dates: July 2014 Prior Therapy Facilty/Provider(s): Unable to remember the name of the hospital  Reason for Treatment: SI ande depression   Prior Outpatient Therapy: Prior Outpatient Therapy Prior Outpatient Therapy: Yes Prior Therapy Dates: Ongoing  Prior Therapy Facilty/Provider(s): [facility] Reason for Treatment: Medication Management nad OPT   Additional Information: Additional Information 1:1 In Past 12 Months?: No CIRT Risk: No Elopement Risk: No Does patient have medical clearance?: Yes                  Objective: Blood pressure 135/75, pulse 77, temperature 98 F (36.7 C), temperature source Oral, resp. rate 16, SpO2 100.00%.There is no weight on file to calculate BMI. Results for orders placed during the hospital encounter of 08/02/13 (from the past 72 hour(s))  ACETAMINOPHEN LEVEL     Status: None   Collection Time    08/02/13  4:40 PM      Result Value Range   Acetaminophen (Tylenol), Serum <15.0  10 - 30 ug/mL   Comment:            THERAPEUTIC CONCENTRATIONS VARY     SIGNIFICANTLY. A RANGE OF  10-30     ug/mL MAY BE AN EFFECTIVE     CONCENTRATION FOR MANY PATIENTS.     HOWEVER, SOME ARE BEST TREATED     AT CONCENTRATIONS OUTSIDE THIS     RANGE.     ACETAMINOPHEN CONCENTRATIONS     >150 ug/mL AT 4 HOURS AFTER     INGESTION AND >50 ug/mL AT 12     HOURS AFTER INGESTION ARE     OFTEN ASSOCIATED WITH TOXIC     REACTIONS.  CBC WITH DIFFERENTIAL     Status: None   Collection Time    08/02/13  4:40 PM      Result Value Range   WBC 6.3  4.0 - 10.5 K/uL   RBC 4.78  4.22 - 5.81 MIL/uL   Hemoglobin 15.1  13.0 - 17.0 g/dL   HCT 43.4  39.0 - 52.0 %   MCV 90.8  78.0 - 100.0 fL   MCH 31.6  26.0 - 34.0 pg   MCHC 34.8  30.0 - 36.0 g/dL   RDW 12.3  11.5 - 15.5 %   Platelets 178  150 - 400 K/uL   Neutrophils Relative %  61  43 - 77 %   Neutro Abs 3.9  1.7 - 7.7 K/uL   Lymphocytes Relative 31  12 - 46 %   Lymphs Abs 1.9  0.7 - 4.0 K/uL   Monocytes Relative 8  3 - 12 %   Monocytes Absolute 0.5  0.1 - 1.0 K/uL   Eosinophils Relative 0  0 - 5 %   Eosinophils Absolute 0.0  0.0 - 0.7 K/uL   Basophils Relative 0  0 - 1 %   Basophils Absolute 0.0  0.0 - 0.1 K/uL  COMPREHENSIVE METABOLIC PANEL     Status: None   Collection Time    08/02/13  4:40 PM      Result Value Range   Sodium 146  137 - 147 mEq/L   Potassium 3.7  3.7 - 5.3 mEq/L   Chloride 107  96 - 112 mEq/L   CO2 24  19 - 32 mEq/L   Glucose, Bld 90  70 - 99 mg/dL   BUN 7  6 - 23 mg/dL   Creatinine, Ser 1.04  0.50 - 1.35 mg/dL   Calcium 9.3  8.4 - 10.5 mg/dL   Total Protein 7.2  6.0 - 8.3 g/dL   Albumin 4.2  3.5 - 5.2 g/dL   AST 19  0 - 37 U/L   ALT 15  0 - 53 U/L   Alkaline Phosphatase 61  39 - 117 U/L   Total Bilirubin 0.8  0.3 - 1.2 mg/dL   GFR calc non Af Amer >90  >90 mL/min   GFR calc Af Amer >90  >90 mL/min   Comment: (NOTE)     The eGFR has been calculated using the CKD EPI equation.     This calculation has not been validated in all clinical situations.     eGFR's persistently <90 mL/min signify possible Chronic  Kidney     Disease.  ETHANOL     Status: None   Collection Time    08/02/13  4:40 PM      Result Value Range   Alcohol, Ethyl (B) <11  0 - 11 mg/dL   Comment:            LOWEST DETECTABLE LIMIT FOR     SERUM ALCOHOL IS 11 mg/dL     FOR MEDICAL PURPOSES ONLY  SALICYLATE LEVEL     Status: Abnormal   Collection Time    08/02/13  4:40 PM      Result Value Range   Salicylate Lvl <9.4 (*) 2.8 - 20.0 mg/dL  GLUCOSE, CAPILLARY     Status: None   Collection Time    08/02/13  4:45 PM      Result Value Range   Glucose-Capillary 91  70 - 99 mg/dL  URINE RAPID DRUG SCREEN (HOSP PERFORMED)     Status: Abnormal   Collection Time    08/02/13  4:49 PM      Result Value Range   Opiates NONE DETECTED  NONE DETECTED   Cocaine NONE DETECTED  NONE DETECTED   Benzodiazepines POSITIVE (*) NONE DETECTED   Amphetamines NONE DETECTED  NONE DETECTED   Tetrahydrocannabinol POSITIVE (*) NONE DETECTED   Barbiturates NONE DETECTED  NONE DETECTED   Comment:            DRUG SCREEN FOR MEDICAL PURPOSES     ONLY.  IF CONFIRMATION IS NEEDED     FOR ANY PURPOSE, NOTIFY LAB     WITHIN 5 DAYS.  LOWEST DETECTABLE LIMITS     FOR URINE DRUG SCREEN     Drug Class       Cutoff (ng/mL)     Amphetamine      1000     Barbiturate      200     Benzodiazepine   973     Tricyclics       532     Opiates          300     Cocaine          300     THC              50  URINALYSIS, ROUTINE W REFLEX MICROSCOPIC     Status: Abnormal   Collection Time    08/02/13  4:49 PM      Result Value Range   Color, Urine YELLOW  YELLOW   APPearance CLOUDY (*) CLEAR   Specific Gravity, Urine 1.024  1.005 - 1.030   pH 8.0  5.0 - 8.0   Glucose, UA NEGATIVE  NEGATIVE mg/dL   Hgb urine dipstick NEGATIVE  NEGATIVE   Bilirubin Urine NEGATIVE  NEGATIVE   Ketones, ur 15 (*) NEGATIVE mg/dL   Protein, ur 30 (*) NEGATIVE mg/dL   Urobilinogen, UA 1.0  0.0 - 1.0 mg/dL   Nitrite NEGATIVE  NEGATIVE   Leukocytes, UA MODERATE (*)  NEGATIVE  URINE MICROSCOPIC-ADD ON     Status: Abnormal   Collection Time    08/02/13  4:49 PM      Result Value Range   WBC, UA 11-20  <3 WBC/hpf   Crystals CA OXALATE CRYSTALS (*) NEGATIVE   Urine-Other MUCOUS PRESENT    ACETAMINOPHEN LEVEL     Status: None   Collection Time    08/02/13  9:17 PM      Result Value Range   Acetaminophen (Tylenol), Serum <15.0  10 - 30 ug/mL   Comment:            THERAPEUTIC CONCENTRATIONS VARY     SIGNIFICANTLY. A RANGE OF 10-30     ug/mL MAY BE AN EFFECTIVE     CONCENTRATION FOR MANY PATIENTS.     HOWEVER, SOME ARE BEST TREATED     AT CONCENTRATIONS OUTSIDE THIS     RANGE.     ACETAMINOPHEN CONCENTRATIONS     >150 ug/mL AT 4 HOURS AFTER     INGESTION AND >50 ug/mL AT 12     HOURS AFTER INGESTION ARE     OFTEN ASSOCIATED WITH TOXIC     REACTIONS.   Labs are reviewed and are pertinent for [place].  Current Facility-Administered Medications  Medication Dose Route Frequency Provider Last Rate Last Dose  . 0.9 %  sodium chloride infusion   Intravenous Continuous Neta Ehlers, MD      . alum & mag hydroxide-simeth (MAALOX/MYLANTA) 200-200-20 MG/5ML suspension 30 mL  30 mL Oral PRN Neta Ehlers, MD      . ibuprofen (ADVIL,MOTRIN) tablet 600 mg  600 mg Oral Q8H PRN Neta Ehlers, MD      . ondansetron (ZOFRAN) tablet 4 mg  4 mg Oral Q8H PRN Neta Ehlers, MD      . zolpidem (AMBIEN) tablet 5 mg  5 mg Oral QHS PRN Neta Ehlers, MD   5 mg at 08/02/13 2251   Current Outpatient Prescriptions  Medication Sig Dispense Refill  . divalproex (DEPAKOTE) 500 MG DR tablet Take 1,000 mg  by mouth at bedtime.      Marland Kitchen FLUoxetine (PROZAC) 20 MG tablet Take 1 tablet (20 mg total) by mouth daily.  30 tablet  3  . metoCLOPramide (REGLAN) 10 MG tablet Take 10 mg by mouth every 6 (six) hours as needed for nausea (nausea).      . Multiple Vitamins-Minerals (MULTIVITAMIN WITH MINERALS) tablet Take 1 tablet by mouth daily.      Marland Kitchen OLANZapine zydis  (ZYPREXA) 10 MG disintegrating tablet Take 10 mg by mouth at bedtime.      . sertraline (ZOLOFT) 50 MG tablet Take 50 mg by mouth daily.        Psychiatric Specialty Exam:     Blood pressure 135/75, pulse 77, temperature 98 F (36.7 C), temperature source Oral, resp. rate 16, SpO2 100.00%.There is no weight on file to calculate BMI.  General Appearance: Casual, Disheveled and Guarded  Eye Contact::  Poor  Speech:  Slow  Volume:  Decreased  Mood:  Anxious and Depressed  Affect:  Constricted  Thought Process:  Loose  Orientation:  Full (Time, Place, and Person)  Thought Content:  Paranoid Ideation  Suicidal Thoughts:  Yes.  with intent/plan  Homicidal Thoughts:  No  Memory:  Immediate;   Fair Recent;   Fair Remote;   Fair  Judgement:  Impaired  Insight:  Lacking  Psychomotor Activity:  Decreased  Concentration:  Fair  Recall:  Fair  Akathisia:  No  Handed:  Right  AIMS (if indicated):     Assets:  Communication Skills  Sleep:      Treatment Plan Summary: Medication management The patient requires inpatient psychiatric services, once he is medically cleared for transfer to behavioral [facility] for stabilization.  Restart his psychotropic medication.  Alessandra Sawdey T. 08/03/2013 12:16 PM

## 2013-08-03 NOTE — Progress Notes (Addendum)
Phone call received from [place] at [facility] stating that pt has been accepted. Report number [phone].  Pt will be IVC'd for transport.  Placed call to [facility] psych ED to pt's nurse for update.  UPDATE: 1805 Received phone call from pt's nurse stating that [place] NP felt that pt could transport without being IVC'd by hospital's transportation system.  [facility] was called by pt's nurse and stated they would transport pt this evening to [place] at [place] was called and given an update pt's way of transport.    Rick Duff Disposition MHT

## 2013-08-03 NOTE — ED Notes (Signed)
Gina with poison control called for update on patient. Poison control has closed their inquiry on the patient.

## 2013-08-03 NOTE — ED Notes (Signed)
Patient observed resting quietly with eyes closed respirations even, unlabored throughout the night.  Patient safety maintained, Q 15 checks continue.

## 2013-08-04 ENCOUNTER — Encounter (HOSPITAL_COMMUNITY): Payer: Self-pay | Admitting: *Deleted

## 2013-08-04 ENCOUNTER — Inpatient Hospital Stay (HOSPITAL_COMMUNITY)
Admission: AD | Admit: 2013-08-04 | Discharge: 2013-08-09 | DRG: 885 | Disposition: A | Discharge status: Home / Self Care | Payer: Federal, State, Local not specified - PPO | Source: Intra-hospital | Attending: Psychiatry | Admitting: Psychiatry

## 2013-08-04 DIAGNOSIS — X838XXA Intentional self-harm by other specified means, initial encounter: Secondary | ICD-10-CM | POA: Diagnosis not present

## 2013-08-04 DIAGNOSIS — E119 Type 2 diabetes mellitus without complications: Secondary | ICD-10-CM | POA: Diagnosis present

## 2013-08-04 DIAGNOSIS — Z79899 Other long term (current) drug therapy: Secondary | ICD-10-CM

## 2013-08-04 DIAGNOSIS — T438X2A Poisoning by other psychotropic drugs, intentional self-harm, initial encounter: Secondary | ICD-10-CM | POA: Diagnosis present

## 2013-08-04 DIAGNOSIS — F411 Generalized anxiety disorder: Secondary | ICD-10-CM | POA: Diagnosis present

## 2013-08-04 DIAGNOSIS — F329 Major depressive disorder, single episode, unspecified: Secondary | ICD-10-CM | POA: Diagnosis not present

## 2013-08-04 DIAGNOSIS — F3289 Other specified depressive episodes: Secondary | ICD-10-CM

## 2013-08-04 DIAGNOSIS — F4310 Post-traumatic stress disorder, unspecified: Secondary | ICD-10-CM | POA: Diagnosis present

## 2013-08-04 DIAGNOSIS — F319 Bipolar disorder, unspecified: Secondary | ICD-10-CM

## 2013-08-04 DIAGNOSIS — F1210 Cannabis abuse, uncomplicated: Secondary | ICD-10-CM

## 2013-08-04 DIAGNOSIS — F1220 Cannabis dependence, uncomplicated: Secondary | ICD-10-CM | POA: Diagnosis present

## 2013-08-04 DIAGNOSIS — F3160 Bipolar disorder, current episode mixed, unspecified: Secondary | ICD-10-CM

## 2013-08-04 DIAGNOSIS — F3130 Bipolar disorder, current episode depressed, mild or moderate severity, unspecified: Secondary | ICD-10-CM | POA: Diagnosis present

## 2013-08-04 DIAGNOSIS — G4700 Insomnia, unspecified: Secondary | ICD-10-CM | POA: Diagnosis present

## 2013-08-04 MED ORDER — LORAZEPAM 1 MG PO TABS: 1.0000 mg | ORAL_TABLET | Freq: Once | ORAL | Status: DC

## 2013-08-04 MED ORDER — DIPHENHYDRAMINE HCL 50 MG/ML IJ SOLN
50.0000 mg | Freq: Once | INTRAMUSCULAR | Status: AC
  Administered 2013-08-04: 50 mg via INTRAMUSCULAR

## 2013-08-04 MED ORDER — ALUM & MAG HYDROXIDE-SIMETH 200-200-20 MG/5ML PO SUSP: 30.0000 mL | ORAL | Status: DC | PRN

## 2013-08-04 MED ORDER — SULFAMETHOXAZOLE-TMP DS 800-160 MG PO TABS
1.0000 | ORAL_TABLET | Freq: Two times a day (BID) | ORAL | Status: AC
  Administered 2013-08-04 – 2013-08-07 (×6): 1 via ORAL
  Filled 2013-08-04 (×8): qty 1

## 2013-08-04 MED ORDER — LORAZEPAM 2 MG/ML IJ SOLN
INTRAMUSCULAR | Status: AC
  Administered 2013-08-04: 09:00:00 via INTRAMUSCULAR
  Filled 2013-08-04: qty 1

## 2013-08-04 MED ORDER — DIPHENHYDRAMINE HCL 25 MG PO CAPS: 50.0000 mg | ORAL_CAPSULE | Freq: Once | ORAL | Status: AC

## 2013-08-04 MED ORDER — IBUPROFEN 600 MG PO TABS
600.0000 mg | ORAL_TABLET | Freq: Three times a day (TID) | ORAL | Status: DC | PRN
  Administered 2013-08-05: 600 mg via ORAL
  Filled 2013-08-04: qty 1

## 2013-08-04 MED ORDER — ACETAMINOPHEN 325 MG PO TABS: 650.0000 mg | ORAL_TABLET | Freq: Four times a day (QID) | ORAL | Status: DC | PRN

## 2013-08-04 MED ORDER — ZOLPIDEM TARTRATE 5 MG PO TABS
5.0000 mg | ORAL_TABLET | Freq: Every evening | ORAL | Status: DC | PRN
  Administered 2013-08-05 – 2013-08-09 (×4): 5 mg via ORAL
  Filled 2013-08-04 (×4): qty 1

## 2013-08-04 MED ORDER — MAGNESIUM HYDROXIDE 400 MG/5ML PO SUSP: 30.0000 mL | Freq: Every day | ORAL | Status: DC | PRN

## 2013-08-04 MED ORDER — DIPHENHYDRAMINE HCL 50 MG/ML IJ SOLN
INTRAMUSCULAR | Status: AC
  Filled 2013-08-04: qty 1

## 2013-08-04 NOTE — Tx Team (Signed)
Initial Interdisciplinary Treatment Plan  PATIENT STRENGTHS: (choose at least two) Ability for insight Average or above average intelligence Capable of independent living Communication skills Financial means General fund of knowledge Motivation for treatment/growth Physical Health Supportive family/friends  PATIENT STRESSORS: Financial difficulties Health problems Marital or family conflict Medication change or noncompliance Occupational concerns Substance abuse   PROBLEM LIST: Problem List/Patient Goals Date to be addressed Date deferred Reason deferred Estimated date of resolution  Suicidal ideation 08/04/2013   D/c        Substance abuse 08/04/2013   D/c        depression 08/04/2013   D/c                           DISCHARGE CRITERIA:  Ability to meet basic life and health needs Adequate post-discharge living arrangements Improved stabilization in mood, thinking, and/or behavior Medical problems require only outpatient monitoring Motivation to continue treatment in a less acute level of care Need for constant or close observation no longer present Reduction of life-threatening or endangering symptoms to within safe limits Safe-care adequate arrangements made Verbal commitment to aftercare and medication compliance Withdrawal symptoms are absent or subacute and managed without 24-hour nursing intervention  PRELIMINARY DISCHARGE PLAN: Attend aftercare/continuing care group Attend PHP/IOP Attend 12-step recovery group Outpatient therapy Participate in family therapy Placement in alternative living arrangements Return to previous living arrangement  PATIENT/FAMIILY INVOLVEMENT: This treatment plan has been presented to and reviewed with the patient, Curtis Townsend.  The patient and family have been given the opportunity to ask questions and make suggestions.  Cammy Copa 08/04/2013, 6:59 PM

## 2013-08-04 NOTE — Progress Notes (Signed)
Adult Psychoeducational Group Note  Date:  08/04/2013 Time:  10:43 PM  Group Topic/Focus:  Goals Group:   The focus of this group is to help patients establish daily goals to achieve during treatment and discuss how the patient can incorporate goal setting into their daily lives to aide in recovery.  Participation Level:  Minimal  Participation Quality:  Attentive  Affect:  Depressed  Cognitive:  Confused  Insight: Lacking  Engagement in Group:  Limited  Modes of Intervention:  Discussion  Additional Comments:  Pt stated that his day was very bad, he stated that he felt like he was in hell and nothing will make it better.  Olena Leatherwood 08/04/2013, 10:43 PM

## 2013-08-04 NOTE — ED Notes (Signed)
[facility] refused to transfer to [facility] stating that [facility] refused to accept per their Supervisor SGT. Pascal. They provided the number [phone] as the contact number for the [facility] Dept

## 2013-08-04 NOTE — Progress Notes (Signed)
Patent's first admission to [facility].  Patient at [facility] once approximately 6 months ago.  Has been at [facility], [place], [place].  Moved to  with family.  Patient's  Wife and mom live with him and his 2 children, age 38 and 11 years.  Tried to OD on pills, not sure amount or kind.  Does not have a job, argued with wife.  Mom is sick/ stroke victim.  Mom is lazy, can get up and down but will not try.  Mom sits in depends.  Pt begs her to take care of herself, stated mom says "I don't care."  Has girlfriend who says she loves him.  "I have trust issues."   Best friend died in car accident last week.  Dad lives in [place] and is dying.  3 siblings will not help with parents.  Another best friend died 2 years ago.  Uncle colon cancer died 2 yrs ago in [place].  Uncle with pancreatic cancer died 3 yrs ago, granddad also died of cancer.  Multiple family deaths.  Sister "[name]" died in [place] 10/28/2004 overdose.  Mom has mental issues.  Patient is lactose intolerant.  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  Rated depression 10, anxiety 8, denied hopeless.  [facility] last week.  THC a lot daily, no less than one ounce daily, first used age 49 hyrs.  No beer.  Drinks vodka once every 3 days "til bottle empty", first drank alcohol age 2 yrs.  Smokes approximately 2 cigarettes daily, declined nicotine patch.  Sees Dr. Juanda Crumble (not sure of last name at [facility].  History of kidney stones, IBS.  Wears glasses.  Mom makes him depressed, causes conflict with his wife.  Mom eats most of food in house.  Denied SI during admission, contracts for safety.  Denied HI.  Denied A/V hallucinations.   Patient given food/drink, oriented to 500 hall. Low fall risk, information given and discussed with patient. Locker 52, diazepam 5 mg 3 pills ([name] Cooner on bottle);  prozac 20 mg ([name] Melka on bottle);  Zoloft 50 mg (pt's name); Reglan 10 mg (pt's name); zyprexa 10 mg (Pt's name); depakote 500 mg (pt's  name).

## 2013-08-04 NOTE — BHH Counselor (Signed)
Per Dr. Dwyane Dee, pt has been accepted to bed 504-2. Support paperwork signed and faxed to [facility]. Originals placed in pt's chart.  EDP and RN aware. Pt's RN Randall Hiss will arrange transport.    Arnold Long, [place] Assessment Counselor

## 2013-08-04 NOTE — Progress Notes (Signed)
Pt is new to the unit this evening shortly before shift change.  Pt has been in his room, lying in bed since coming on the unit.  Pt states he is ok and doesn't have any needs at this time.  Pt denies SI/HI/AV.  Pt was encouraged to make his needs known to staff.  Conversation was minimal with pt as he was not open to talking with Probation officer at this time, but did answer basic questions.  Support and encouragement offered.  Safety maintained with q15 minute checks.

## 2013-08-04 NOTE — Progress Notes (Signed)
Patient ID: Curtis Townsend, male   DOB: 1975-12-16, 38 y.o.   MRN: [id number] Patient is accepted for admission in our inpatient Psychiatric unit.  Earlier patient was agitated and angry because he was going to be admitted at an outside hospital.  Patient was medicated, reevaluated by Dr Dwyane Dee and this Probation officer.  Patient stated he did not want to go to outside hospital and and he needed to be close to his family.  Dr Dwyane Dee accepted patient to our unit due to bed becoming available.  He denied suicide at this time. Charmaine Downs   PMHNP-BC

## 2013-08-04 NOTE — ED Notes (Signed)
[place], [phone]. Accepting MD Dr Truman Hayward.

## 2013-08-04 NOTE — Progress Notes (Signed)
Patient examined , is depressed, needs inpatient treatment.

## 2013-08-05 DIAGNOSIS — F1210 Cannabis abuse, uncomplicated: Secondary | ICD-10-CM

## 2013-08-05 DIAGNOSIS — T438X2A Poisoning by other psychotropic drugs, intentional self-harm, initial encounter: Secondary | ICD-10-CM | POA: Diagnosis present

## 2013-08-05 DIAGNOSIS — T43502A Poisoning by unspecified antipsychotics and neuroleptics, intentional self-harm, initial encounter: Secondary | ICD-10-CM

## 2013-08-05 DIAGNOSIS — T438X1A Poisoning by other psychotropic drugs, accidental (unintentional), initial encounter: Secondary | ICD-10-CM

## 2013-08-05 DIAGNOSIS — T438X4A Poisoning by other psychotropic drugs, undetermined, initial encounter: Secondary | ICD-10-CM

## 2013-08-05 MED ORDER — FLUOXETINE HCL 10 MG PO CAPS
10.0000 mg | ORAL_CAPSULE | Freq: Every day | ORAL | Status: DC
  Administered 2013-08-05 – 2013-08-08 (×4): 10 mg via ORAL
  Filled 2013-08-05 (×7): qty 1

## 2013-08-05 MED ORDER — ARIPIPRAZOLE 5 MG PO TABS
5.0000 mg | ORAL_TABLET | Freq: Two times a day (BID) | ORAL | Status: DC
  Administered 2013-08-05 – 2013-08-09 (×9): 5 mg via ORAL
  Filled 2013-08-05 (×16): qty 1

## 2013-08-05 MED ORDER — DIVALPROEX SODIUM ER 500 MG PO TB24
1000.0000 mg | ORAL_TABLET | Freq: Every day | ORAL | Status: DC
  Administered 2013-08-05 – 2013-08-08 (×4): 1000 mg via ORAL
  Filled 2013-08-05 (×7): qty 2
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
## 2013-08-05 NOTE — Care Management Utilization Note (Signed)
Per State Regulation 482.30  The chart was reviewed for necessity with respect to the patient's Admission/ Duration of stay.Admission 08/04/13  Next Review Date: 03/04/99  Conception Oms, RN, BSN

## 2013-08-05 NOTE — Progress Notes (Signed)
Patient ID: Curtis Townsend, male   DOB: July 15, 1975, 38 y.o.   MRN: [id number] D- Patient repots his sleep is fair and his appetite is improving.  His energy level is normal and his ability to pay attention is improving.  His depression is 8/10 and his ability to pay attention is improving.  He says that he smokes "a lot of weed every day"  and he is sweating and chilling and has tremors that he attributes to not having the marijuana. Says his goal is "to keep calm .  Keep my mind together."  He is rating his anxiety at 7/10.  A- Supported patient.  R-He is attending groups and interacting with peers and staff.
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
## 2013-08-05 NOTE — BHH Group Notes (Signed)
[facility] LCSW Group Therapy      Feelings About Diagnosis 1:15 - 2:30 PM         08/05/2013  3:23 PM    Type of Therapy:  Group Therapy  Participation Level:  Active  Participation Quality:  Appropriate  Affect:  Appropriate  Cognitive:  Alert and Appropriate  Insight:  Developing/Improving and Engaged  Engagement in Therapy:  Developing/Improving and Engaged  Modes of Intervention:  Discussion, Education, Exploration, Problem-Solving, Rapport Building, Support  Summary of Progress/Problems:  Patient actively participated in group. Patient discussed past and present diagnosis and the effects it has had on  life.  Patient talked about family and society being judgmental and the stigma associated with having a mental health diagnosis.  He shared he feels he is not given a chance for success once people learns he has Bipolar Disorder.  He shared he is expected to act a certain way (beligerent) and often does what is expected.  Concha Pyo 08/05/2013  3:23 PM

## 2013-08-05 NOTE — Progress Notes (Signed)
The focus of this group is to educate the patient on the purpose and policies of crisis stabilization and provide a format to answer questions about their admission.  The group details unit policies and expectations of patients while admitted.Patient attended group but was called out two times to talk with staff.

## 2013-08-05 NOTE — Progress Notes (Signed)
Recreation Therapy Notes  Animal-Assisted Activity/Therapy (AAA/T) Program Checklist/Progress Notes Patient Eligibility Criteria Checklist & Daily Group note for Rec Tx Intervention  Date: 01.27.2015 Time: 2:45pm Location: [address]    AAA/T Program Assumption of Risk Form signed by Patient/ or Parent Legal Guardian yes  Patient is free of allergies or sever asthma yes  Patient reports no fear of animals yes  Patient reports no history of cruelty to animals yes   Patient understands his/her participation is voluntary yes  Behavioral Response: Did not attend.   Laureen Ochs Marlin Jarrard, LRT/CTRS  Lane Hacker 08/05/2013 5:39 PM

## 2013-08-05 NOTE — Progress Notes (Signed)
Adult Psychoeducational Group Note  Date:  08/05/2013 Time:  8:00 pm  Group Topic/Focus:  Wrap-Up Group:   The focus of this group is to help patients review their daily goal of treatment and discuss progress on daily workbooks.  Participation Level:  Did Not Attend   Madelyn Flavors 08/05/2013, 10:13 PM
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
## 2013-08-06 DIAGNOSIS — F1910 Other psychoactive substance abuse, uncomplicated: Secondary | ICD-10-CM

## 2013-08-06 DIAGNOSIS — F1994 Other psychoactive substance use, unspecified with psychoactive substance-induced mood disorder: Secondary | ICD-10-CM

## 2013-08-06 DIAGNOSIS — F4310 Post-traumatic stress disorder, unspecified: Secondary | ICD-10-CM

## 2013-08-06 DIAGNOSIS — F3160 Bipolar disorder, current episode mixed, unspecified: Secondary | ICD-10-CM

## 2013-08-06 MED ORDER — ENSURE COMPLETE PO LIQD
237.0000 mL | Freq: Two times a day (BID) | ORAL | Status: DC
  Administered 2013-08-07 – 2013-08-09 (×4): 237 mL via ORAL

## 2013-08-06 MED ORDER — PROMETHAZINE HCL 25 MG/ML IJ SOLN
12.5000 mg | Freq: Once | INTRAMUSCULAR | Status: AC
  Administered 2013-08-06: 12.5 mg via INTRAMUSCULAR
  Filled 2013-08-06 (×2): qty 1

## 2013-08-06 MED ORDER — RISAQUAD PO CAPS
1.0000 | ORAL_CAPSULE | Freq: Three times a day (TID) | ORAL | Status: DC
  Administered 2013-08-07 – 2013-08-09 (×8): 1 via ORAL
  Filled 2013-08-06 (×16): qty 1

## 2013-08-06 MED ORDER — ONDANSETRON 4 MG PO TBDP
4.0000 mg | ORAL_TABLET | Freq: Three times a day (TID) | ORAL | Status: DC | PRN
  Administered 2013-08-06: 4 mg via ORAL
  Filled 2013-08-06: qty 1
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
## 2013-08-06 NOTE — Progress Notes (Signed)
NUTRITION ASSESSMENT  Pt identified as at risk on the Malnutrition Screen Tool  INTERVENTION: 1. Educated patient on the importance of nutrition and encouraged intake of food and beverages. 2. Discussed weight goals. 3. Supplements: Ensure Complete po BID, each supplement provides 350 kcal and 13 grams of protein  NUTRITION DIAGNOSIS: Unintentional weight loss related to sub-optimal intake as evidenced by pt report.   Goal: Pt to meet >/= 90% of their estimated nutrition needs.  Monitor:  PO intake  Assessment:  Patient admitted with BP-mixed with SI.  Poor appetite currently and prior to admit.  States that appetite and intake are increasing.  Patient reports UBW of 195 lbs 9 months ago but per e-chart, appears that this may go back to 2013.  Patient has had a weight loss of 18 lbs int he past 6 weeks.  (10%loss)   38 y.o. male  Height: Ht Readings from Last 1 Encounters:  08/04/13 6\' 1"  (1.854 m)    Weight: Wt Readings from Last 1 Encounters:  08/04/13 158 lb (71.668 kg)    Weight Hx: Wt Readings from Last 10 Encounters:  08/04/13 158 lb (71.668 kg)  06/20/13 176 lb 6.4 oz (80.015 kg)  10/16/12 160 lb (72.576 kg)  09/02/12 172 lb 12.8 oz (78.382 kg)  06/27/12 178 lb 6.4 oz (80.922 kg)  06/10/12 183 lb (83.008 kg)  06/10/12 183 lb (83.008 kg)  05/16/12 186 lb (84.369 kg)  04/08/12 188 lb 6.4 oz (85.458 kg)    BMI:  Body mass index is 20.85 kg/(m^2). Pt meets criteria for normal weight for height based on current BMI.  Estimated Nutritional Needs: Kcal: 25-30 kcal/kg Protein: > 1 gram protein/kg Fluid: 1 ml/kcal  Diet Order: General Pt is also offered choice of unit snacks mid-morning and mid-afternoon.  Pt is eating as desired.   Lab results and medications reviewed.   Antonieta Iba, RD, LDN Clinical Inpatient Dietitian Pager:  [phone] Weekend and after hours pager:  [phone]

## 2013-08-06 NOTE — Progress Notes (Signed)
Adult Psychoeducational Group Note  Date:  08/06/2013 Time:  2:12 PM  Group Topic/Focus:  Personal Choices and Values:   The focus of this group is to help patients assess and explore the importance of values in their lives, how their values affect their decisions, how they express their values and what opposes their expression.  Participation Level:  Did Not Attend   Sonda Primes 08/06/2013, 2:12 PM

## 2013-08-06 NOTE — Progress Notes (Signed)
Pt out of his room and visible in the milieu.  Pt reports he is doing better.  He says he had a visit from his wife.  He realizes now how important his wife and children are to him.  He denies SI/HI/AV at this time.  Wants to get control of his alcohol use.  Pt makes his needs known to staff.  Pt voices no needs or concerns at this time.  Support and encouragement offered.  Safety maintained with q15 minute checks.

## 2013-08-06 NOTE — Progress Notes (Signed)
Adult Psychoeducational Group Note  Date:  08/06/2013 Time:  8:56 PM  Group Topic/Focus:  Wrap-Up Group:   The focus of this group is to help patients review their daily goal of treatment and discuss progress on daily workbooks.  Participation Level:  Active  Participation Quality:  Appropriate  Affect:  Appropriate  Cognitive:  Appropriate  Insight: Appropriate  Engagement in Group:  Engaged  Modes of Intervention:  Support  Additional Comments:  Pt stated one good thing that happened was that he was able to receive his daughters pictures today and he realizes that this is all a battle. He shared that he prays as his coping skill and that it helps him to achieve his freedom   Curtis Townsend 08/06/2013, 8:56 PM

## 2013-08-06 NOTE — BHH Group Notes (Signed)
[facility] LCSW Group Therapy  Emotional Regulation 1:15 - 2: 30 PM        08/06/2013  3:29 PM   Type of Therapy:  Group Therapy  Participation Level: Patient did not attend group.    Concha Pyo 08/06/2013 3:29 PM

## 2013-08-06 NOTE — BHH Group Notes (Signed)
[facility] LCSW Aftercare Discharge Planning Group Note   08/06/2013 9:39 AM    Participation Quality:  Appropraite  Mood/Affect:  Appropriate  Depression Rating:  6  Anxiety Rating:  2  Thoughts of Suicide:  No  Will you contract for safety?   NA  Current AVH:  No  Plan for Discharge/Comments:  Patient attended discharge planning group and actively participated in group.  He reports feeling better today.  Patient advised he is seen by [facility] for outpatient services.  CSW provided all participants with daily workbook.   Transportation Means: Patient has transportation.   Supports:  Patient has a support system.   Daisja Kessinger, Eulas Post
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
## 2013-08-06 NOTE — Progress Notes (Signed)
D: Patient appropriate and cooperative with staff and peers. Patient has depressed affect and mood. He reported on the self inventory sheet that he's sleeping fair, appetite and ability to pay attention are both improving, and energy level is normal. Patient rated depression "5" and feelings of hopelessness "4". Complained of nausea/vomiting. He's visible in the milieu and interactive with peers. Adhering to current medication regimen.  A: Support and encouragement provided to patient. Scheduled medications administered per MD orders. Maintain Q15 minute checks for safety.  R: Patient receptive. Denies SI/HI and AVH. Patient remains safe.
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
## 2013-08-07 NOTE — Progress Notes (Signed)
D: Pt denies SI/HI/AVH. Pt is pleasant and cooperative. Pt stated he has to start learning to take care of self first, and will take his meds when he leaves , so he can take care of his loved ones. Later in the shift pt found sitting at end of the hall upset. Pt concerned about situation between his wife and his GF. Pt said he did not know what to do.  A: Pt was offered support and encouragement. Pt was given scheduled medications. Pt was encourage to attend groups. Q 15 minute checks were done for safety. Pt consoled and asked to decide what he really wants, and if the other girl was worth losing his family. Recommended counseling by a professional that both people could agree on to help them through this rough time.   R:Pt attends groups and interacts well with peers and staff. Pt is taking medication. Pt has no complaints at this time.Pt receptive to treatment and safety maintained on unit. Pt decided that he wanted his family .

## 2013-08-07 NOTE — Progress Notes (Signed)
Adult Psychoeducational Group Note  Date:  08/07/2013 Time:  1:31 PM  Group Topic/Focus:  Stages of Change:   The focus of this group is to explain the stages of change and help patients identify changes they want to make upon discharge.  Participation Level:  Active  Participation Quality:  Appropriate  Affect:  Appropriate  Cognitive:  Appropriate  Insight: Appropriate  Engagement in Group:  Engaged  Modes of Intervention:  Discussion  Additional Comments:  Pt. Participated in group.   Sonda Primes 08/07/2013, 1:31 PM

## 2013-08-07 NOTE — Progress Notes (Signed)
D: Patient has depressed affect and mood. Declined self inventory sheet today. He voiced that he has no depression or feelings of hopelessness at this time. Patient is attending most groups and continues to be compliant with medication regimen.  A: Support and encouragement provided to patient. Administered scheduled medications per ordering MD. Monitor Q15 minute checks for safety.  R: Patient receptive. Denies SI/HI. Patient remains safe on the unit.

## 2013-08-07 NOTE — [facility] Suicide Risk Assessment (Signed)
[place] INPATIENT:  Family/Significant Other Suicide Prevention Education  Suicide Prevention Education:  Education Completed; Martino Tompson, Wife, [phone];  has been identified by the patient as the family member/significant other with whom the patient will be residing, and identified as the person(s) who will aid the patient in the event of a mental health crisis (suicidal ideations/suicide attempt).  With written consent from the patient, the family member/significant other has been provided the following suicide prevention education, prior to the and/or following the discharge of the patient.  The suicide prevention education provided includes the following:  Suicide risk factors  Suicide prevention and interventions  National Suicide Hotline telephone number  [facility] assessment telephone number  [facility] Emergency Assistance [place] and/or Residential Mobile Crisis Unit telephone number  Request made of family/significant other to:  Remove weapons (e.g., guns, rifles, knives), all items previously/currently identified as safety concern.  Wife advised patient does not have access to guns.  Remove drugs/medications (over-the-counter, prescriptions, illicit drugs), all items previously/currently identified as a safety concern.  The family member/significant other verbalizes understanding of the suicide prevention education information provided.  The family member/significant other agrees to remove the items of safety concern listed above.  Concha Pyo 08/07/2013, 8:48 AM

## 2013-08-07 NOTE — Progress Notes (Signed)
Recreation Therapy Notes  Date: 01.29.2015 Time: 2:45pm Location: [address] Dayroom   Group Topic: Leisure Education  Goal Area(s) Addresses:  Patient will identify positive leisure activities.  Patient will identify one positive benefit of participation in leisure activities.   Behavioral Response: Did not attend.   Laureen Ochs Vickie Ponds, LRT/CTRS  Lane Hacker 08/07/2013 5:12 PM

## 2013-08-07 NOTE — Progress Notes (Signed)
Adult Psychoeducational Group Note  Date:  08/07/2013 Time:  2000  Group Topic/Focus:  Wrap-Up Group:   The focus of this group is to help patients review their daily goal of treatment and discuss progress on daily workbooks.  Participation Level:  Minimal  Participation Quality:  Appropriate  Affect:  Appropriate  Cognitive:  Appropriate  Insight: Appropriate  Engagement in Group:  Limited  Modes of Intervention:  Discussion and Support  Additional Comments:  Pt shared that his therapeutic leisure activities included learning a new thing every day and hanging out with his family.  Curtis Townsend Monique 08/07/2013, 10:10 PM

## 2013-08-07 NOTE — [facility] Group Notes (Signed)
[facility] LCSW Group Therapy  [facility] 1:15 - 2:30 PM  08/07/2013   Type of Therapy:  Group Therapy  Participation Level: Minimal  Participation Quality:  Attentive  Affect:  Appropriate  Cognitive:  Appropriate  Insight:  Developing/Improving  Engagement in Therapy:  Developing/Improving   Modes of Intervention:  Discussion, Education, Exploration, Problem-Solving, Rapport Building, Support   Summary of Progress/Problems:  Patient listened attentively to speaker from [facility]. Patient nodded in agreement with comments made by the speaker and shared he is interested in following up with the agency at discharge.  Curtis Townsend 08/07/2013

## 2013-08-07 NOTE — Progress Notes (Signed)
Adult Psychoeducational Group Note  Date:  08/07/2013 Time:  9:00 AM  Group Topic/Focus:  Morning Wellness Group  Participation Level:  Did Not Attend  Additional Comments: Patient was in bed lying down during group.  Curtis Townsend E 08/07/2013, 1:18 PM

## 2013-08-08 DIAGNOSIS — F3130 Bipolar disorder, current episode depressed, mild or moderate severity, unspecified: Principal | ICD-10-CM

## 2013-08-08 MED ORDER — FLUOXETINE HCL 20 MG PO CAPS
20.0000 mg | ORAL_CAPSULE | Freq: Every day | ORAL | Status: DC
  Administered 2013-08-09: 20 mg via ORAL
  Filled 2013-08-08 (×5): qty 1

## 2013-08-08 NOTE — Progress Notes (Signed)
Recreation Therapy Notes  Date: 01.30.2015 Time: 2:45pm Location: [address] Dayroom   Group Topic: Communication, Team Building, Problem Solving  Goal Area(s) Addresses:  Patient will effectively work with peer towards shared goal.  Patient will identify skill used to make activity successful.  Patient will identify how skills used during activity can be used to reach post d/c goals.   Behavioral Response: Engaged, Attentive, Appropriate   Intervention: Problem Solving Activitiy  Activity: Life Boat. Patients were given a scenario about being on a sinking yacht. Patients were informed the yacht included 43 guest, 8 of which could be placed on the life boat, along with all group members. Individuals on guest list were of varying socioeconomic classes such as a Information systems manager, Associate Professor, Recruitment consultant, Barrister's clerk.   Education: Education officer, community, Discharge Planning   Education Outcome: Acknowledges understanding  Clinical Observations/Feedback: Patient actively engaged in group activity, voicing his opinion and debating with peers appropriately. Patient made no contributions to group discussion, but appeared to actively listen as he maintained appropriate eye contact with speaker.  Laureen Ochs Kizzi Overbey, LRT/CTRS  Emberlee Sortino L 08/08/2013 5:15 PM

## 2013-08-08 NOTE — Progress Notes (Signed)
Adult Psychoeducational Group Note  Date:  08/08/2013 Time:  9:24 PM  Group Topic/Focus:  Wrap-Up Group:   The focus of this group is to help patients review their daily goal of treatment and discuss progress on daily workbooks.  Participation Level:  Active  Participation Quality:  Appropriate  Affect:  Appropriate  Cognitive:  Appropriate  Insight: Appropriate  Engagement in Group:  Engaged  Modes of Intervention:  Discussion  Additional Comments:  The patient expressed that he learned from group not to isolate himself.The patient was extremely verbal in not repeating his visit to [facility].  Nash Shearer 08/08/2013, 9:24 PM

## 2013-08-08 NOTE — Tx Team (Signed)
Interdisciplinary Treatment Plan Update   Date Reviewed:  08/08/2013  Time Reviewed:  8:37 AM  Progress in Treatment:   Attending groups: Yes Participating in groups: Yes Taking medication as prescribed: Yes  Tolerating medication: Yes Family/Significant other contact made:  Yes, collateral contact with wife Patient understands diagnosis: Yes  Discussing patient identified problems/goals with staff: Yes Medical problems stabilized or resolved: Yes Denies suicidal/homicidal ideation: Yes Patient has not harmed self or others: Yes  For review of initial/current patient goals, please see plan of care.  Estimated Length of Stay:  2-3 days  Reasons for Continued Hospitalization:  Anxiety Depression Medication stabilization   New Problems/Goals identified:    Discharge Plan or Barriers:   Home with outpatient follow up with [facility]  Additional Comments:  N/A Attendees:  Patient:  08/08/2013 8:37 AM   Signature: Mylinda Latina, MD 08/08/2013 8:37 AM  Signature:   08/08/2013 8:37 AM  Signature:  Trixie Deis, NP 08/08/2013 8:37 AM  Signature: Clinton Sawyer, RN 08/08/2013 8:37 AM  Signature: Desma Paganini, RN 08/08/2013 8:37 AM  Signature:  Joette Catching, LCSW 08/08/2013 8:37 AM  Signature:  Regan Lemming, LCSW 08/08/2013 8:37 AM  Signature:  Lucinda Dell, Care Coordinator 08/08/2013 8:37 AM  Signature:  08/08/2013 8:37 AM  Signature: 08/08/2013  8:37 AM  Signature:   Lars Pinks, RN [facility] 08/08/2013  8:37 AM  Signature:  Eduard Roux, RN 08/08/2013  8:37 AM    Scribe for Treatment Team:   Joette Catching,  08/08/2013 8:37 AM

## 2013-08-08 NOTE — Progress Notes (Signed)
D: Patient presents with depressed mood and affect. He reported on the self inventory sheet that he's sleeping fair, good appetite, normal energy level and ability to pay attention is improving. Patient rated depression and feelings of hopelessness "2". He's participating in groups, visible in the milieu and interactive with peers in the dayroom. Patient adheres to medication regimen.  A: Support and encouragement provided to patient. Scheduled medications administered per MD orders. Maintain Q15 minute checks for safety.  R: Patient receptive. Denies SI/HI/AVH. Patient remains safe.
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
## 2013-08-08 NOTE — BHH Group Notes (Signed)
[place] LCSW Group Therapy  Feelings Around Relapse 1:15 -2:30        08/08/2013   Type of Therapy:  Group Therapy  Participation Level:  Appropriate  Participation Quality:  Appropriate  Affect:  Appropriate  Cognitive:  Attentive Appropriate  Insight:  Developing/Improving  Engagement in Therapy: Developing/Improving  Modes of Intervention:  Discussion Exploration Problem-Solving Supportive  Summary of Progress/Problems:  The topic for today was feelings around relapse.    Patient processed feelings toward relapse and was able to relate to peers. He shared relapsing for him would be isolated and becoming more and not sleeping.  Patient identified coping skills that can be used to prevent a relapse.   Concha Pyo 08/08/2013

## 2013-08-08 NOTE — BHH Group Notes (Signed)
[facility] LCSW Aftercare Discharge Planning Group Note   08/08/2013 9:45 AM    Participation Quality:  Appropraite  Mood/Affect:  Appropriate  Depression Rating:  2  Anxiety Rating:  2  Thoughts of Suicide:  No  Will you contract for safety?   NA  Current AVH:  No  Plan for Discharge/Comments:  Patient attended discharge planning group and actively participated in group.  Patient reports doing well and hopes to discharge home soon.  He will follow up with [facility] for outpatient services.  CSW provided all participants with daily workbook.   Transportation Means: Patient has transportation.   Supports:  Patient has a support system.   Bessie Boyte, Eulas Post

## 2013-08-09 MED ORDER — RISAQUAD PO CAPS: 1.0000 | ORAL_CAPSULE | Freq: Three times a day (TID) | ORAL | Status: DC

## 2013-08-09 MED ORDER — ARIPIPRAZOLE 5 MG PO TABS: 5.0000 mg | ORAL_TABLET | Freq: Two times a day (BID) | ORAL | Status: DC

## 2013-08-09 MED ORDER — FLUOXETINE HCL 20 MG PO TABS: 20.0000 mg | ORAL_TABLET | Freq: Every day | ORAL | Status: DC

## 2013-08-09 MED ORDER — ZOLPIDEM TARTRATE 5 MG PO TABS: 5.0000 mg | ORAL_TABLET | Freq: Every evening | ORAL | Status: DC | PRN

## 2013-08-09 MED ORDER — DIVALPROEX SODIUM 500 MG PO DR TAB: 1000.0000 mg | DELAYED_RELEASE_TABLET | Freq: Every day | ORAL | Status: DC
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
## 2013-08-09 NOTE — BHH Group Notes (Signed)
[facility] Group Notes:  (Nursing/MHT/Case Management/Adjunct)  Date:  08/09/2013  Time:  10:59 AM  Type of Therapy:  Nurse Education  Participation Level:  Active  Participation Quality:  Appropriate  Affect:  Appropriate  Cognitive:  Alert  Insight:  Appropriate  Engagement in Group:  Engaged  Modes of Intervention:  Discussion  Summary of Progress/Problems:  Delman Kitten 08/09/2013, 10:59 AM

## 2013-08-09 NOTE — Progress Notes (Signed)
[facility] Adult Case Management Discharge Plan :  Will you be returning to the same living situation after discharge: Yes,  home with wife, 2 children, and mother At discharge, do you have transportation home?:Yes,  family Do you have the ability to pay for your medications:Yes,  says can afford with [facility]'s help  Release of information consent forms completed and in the chart;  Patient's signature needed at discharge.  Patient to Follow up at: Follow-up Information   Follow up with [facility] On 08/13/2013. (Please go to [facility]'s walk in clinic on Wednesday, August 12, 2013 or any weekday between [place] - 3 PM for medication management and counseling)    Contact information:   [address] [place],    [address]  [phone]      Patient denies SI/HI:   Yes,  "No"    Safety Planning and Suicide Prevention discussed:  Yes,  by weekday staff with both wife and patient  Lysle Dingwall 08/09/2013, 2:32 PM

## 2013-08-09 NOTE — Discharge Instructions (Signed)
Mood Disorders  Mood disorders are conditions that affect the way a person feels emotionally. The main mood disorders include:  · Depression.  · Bipolar disorder.  · Dysthymia. Dysthymia is a mild, lasting (chronic) depression. Symptoms of dysthymia are similar to depression, but not as severe.  · Cyclothymia. Cyclothymia includes mood swings, but the highs and lows are not as severe as they are in bipolar disorder. Symptoms of cyclothymia are similar to those of bipolar disorder, but less extreme.  CAUSES   Mood disorders are probably caused by a combination of factors. People with mood disorders seem to have physical and chemical changes in their brains. Mood disorders run in families, so there may be genetic causes. Severe trauma or stressful life events may also increase the risk of mood disorders.   SYMPTOMS   Symptoms of mood disorders depend on the specific type of condition.  Depression symptoms include:  · Feeling sad, worthless, or hopeless.  · Negative thoughts.  · Inability to enjoy one's usual activities.  · Low energy.  · Sleeping too much or too little.  · Appetite changes.  · Crying.  · Concentration problems.  · Thoughts of harming oneself.  Bipolar disorder symptoms include:  · Periods of depression (see above symptoms).  · Mood swings, from sadness and depression, to abnormal elation and excitement.  · Periods of mania:  · Racing thoughts.  · Fast speech.  · Poor judgment, and careless, dangerous choices.  · Decreased need for sleep.  · Risky behavior.  · Difficulty concentrating.  · Irritability.  · Increased energy.  · Increased sex drive.  DIAGNOSIS   There are no blood tests or X-rays that can confirm a mood disorder. However, your caregiver may choose to run some tests to make sure that there is not another physical cause for your symptoms. A mood disorder is usually diagnosed after an in-depth interview with a caregiver.  TREATMENT   Mood disorders can be treated with one or more of the  following:  · Medicine. This may include antidepressants, mood-stabilizers, or anti-psychotics.  · Psychotherapy (talk therapy).  · Cognitive behavioral therapy. You are taught to recognize negative thoughts and behavior patterns, and replace them with healthy thoughts and behaviors.  · Electroconvulsive therapy. For very severe cases of deep depression, a series of treatments in which an electrical current is applied to the brain.  · Vagus nerve stimulation. A pulse of electricity is applied to a portion of the brain.  · Transcranial magnetic stimulation. Powerful magnets are placed on the head that produce electrical currents.  · Hospitalization. In severe situations, or when someone is having serious thoughts of harming him or herself, hospitalization may be necessary in order to keep the person safe. This is also done to quickly start and monitor treatment.  HOME CARE INSTRUCTIONS   · Take your medicine exactly as directed.  · Attend all of your therapy sessions.  · Try to eat regular, healthy meals.  · Exercise daily. Exercise may improve mood symptoms.  · Get good sleep.  · Do not drink alcohol or use pot or other drugs. These can worsen mood symptoms and cause anxiety and psychosis.  · Tell your caregiver if you develop any side effects, such as feeling sick to your stomach (nauseous), dry mouth, dizziness, constipation, drowsiness, tremor, weight gain, or sexual symptoms. He or she may suggest things you can do to improve symptoms.  · Learn ways to cope with the stress of having a   chronic illness. This includes yoga, meditation, tai chi, or participating in a support group.  · Drink enough water to keep your urine clear or pale yellow. Eat a high-fiber diet. These habits may help you avoid constipation from your medicine.  SEEK IMMEDIATE MEDICAL CARE IF:  · Your mood worsens.  · You have thoughts of hurting yourself or others.  · You cannot care for yourself.  · You develop the sensation of hearing or seeing  something that is not actually present (auditory or visual hallucinations).  · You develop abnormal thoughts.  Document Released: 04/23/2009 Document Revised: 09/18/2011 Document Reviewed: 04/23/2009  [employer]® Patient Information ©2014 [employer].
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
## 2013-08-09 NOTE — [facility] Group Notes (Signed)
[facility] Group Notes:  (Clinical Social Work)  08/09/2013   1:15-2:15PM  Summary of Progress/Problems:   The main focus of today's process group was for the patient to identify ways in which they have sabotaged their own mental health wellness/recovery.  Motivational interviewing and a handout were used to explore the benefits and costs of their self-sabotaging behavior as well as the benefits and costs of changing this behavior.  The Stages of Change were explained to the group using a handout, and patients identified where they are with regard to changing self-defeating behaviors.  The patient expressed he self-sabotages with isolating himself into the basement where the room he and his wife share is located.  He goes in, turns off the light so it is completely black, and lays on the bed thinking.  He has recently been keeping a drink by the bed due to the loss of his friend.  He does not feel his mother is supportive of him, although she herself suffers from depression.  The group advised him to distance himself from his mother, and he did not feel that is possible due to her living with him.  He would even consider getting her to move out.  He was discharged today.  Type of Therapy:  Process Group  Participation Level:  Active  Participation Quality:  Attentive and Sharing  Affect:  Appropriate  Cognitive:  Appropriate and Oriented  Insight:  Developing/Improving  Engagement in Therapy:  Engaged  Modes of Intervention:  Education, Motivational Interviewing   Selmer Dominion, LCSW 08/09/2013, 4:00pm

## 2013-08-09 NOTE — Progress Notes (Signed)
Adult Psychoeducational Group Note  Date:  08/09/2013 Time:  4:04 PM  Group Topic/Focus:  Emotional Education:   The focus of this group is to discuss what feelings/emotions are, and how they are experienced.  Participation Level:  Did Not Attend    Curtis Townsend 08/09/2013, 4:04 PM
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
## 2013-08-14 NOTE — Progress Notes (Signed)
Patient Discharge Instructions:  After Visit Summary (AVS):   Faxed to:  08/14/13 Discharge Summary Note:   Faxed to:  08/14/13 Psychiatric Admission Assessment Note:   Faxed to:  08/14/13 Suicide Risk Assessment - Discharge Assessment:   Faxed to:  08/14/13 Faxed/Sent to the Next Level Care provider:  08/14/13 Faxed to [facility], 08/14/2013, 3:19 PM

## 2013-09-11 DIAGNOSIS — H40019 Open angle with borderline findings, low risk, unspecified eye: Secondary | ICD-10-CM | POA: Diagnosis not present

## 2013-09-16 ENCOUNTER — Ambulatory Visit: Payer: Federal, State, Local not specified - PPO | Admitting: Physician Assistant

## 2013-10-17 ENCOUNTER — Ambulatory Visit: Payer: Federal, State, Local not specified - PPO | Admitting: Physician Assistant

## 2013-10-22 ENCOUNTER — Ambulatory Visit: Payer: Federal, State, Local not specified - PPO | Admitting: Internal Medicine

## 2013-10-22 DIAGNOSIS — Z0289 Encounter for other administrative examinations: Secondary | ICD-10-CM

## 2013-11-11 ENCOUNTER — Encounter: Payer: Self-pay | Admitting: Internal Medicine

## 2013-11-11 ENCOUNTER — Ambulatory Visit (INDEPENDENT_AMBULATORY_CARE_PROVIDER_SITE_OTHER): Payer: Federal, State, Local not specified - PPO | Admitting: Internal Medicine

## 2013-11-11 ENCOUNTER — Ambulatory Visit (INDEPENDENT_AMBULATORY_CARE_PROVIDER_SITE_OTHER): Payer: Federal, State, Local not specified - PPO

## 2013-11-11 VITALS — BP 110/68 | HR 76 | Temp 99.4°F | Resp 16 | Wt 169.0 lb

## 2013-11-11 DIAGNOSIS — R112 Nausea with vomiting, unspecified: Secondary | ICD-10-CM | POA: Diagnosis not present

## 2013-11-11 DIAGNOSIS — R82998 Other abnormal findings in urine: Secondary | ICD-10-CM | POA: Diagnosis not present

## 2013-11-11 DIAGNOSIS — F319 Bipolar disorder, unspecified: Secondary | ICD-10-CM | POA: Diagnosis not present

## 2013-11-11 DIAGNOSIS — R8281 Pyuria: Secondary | ICD-10-CM

## 2013-11-11 LAB — URINALYSIS, ROUTINE W REFLEX MICROSCOPIC
HGB URINE DIPSTICK: NEGATIVE
KETONES UR: NEGATIVE
Nitrite: NEGATIVE
Specific Gravity, Urine: 1.025 (ref 1.000–1.030)
URINE GLUCOSE: NEGATIVE
UROBILINOGEN UA: 1 (ref 0.0–1.0)
pH: 6.5 (ref 5.0–8.0)

## 2013-11-11 LAB — COMPREHENSIVE METABOLIC PANEL
ALT: 33 U/L (ref 0–53)
AST: 29 U/L (ref 0–37)
Albumin: 4.2 g/dL (ref 3.5–5.2)
Alkaline Phosphatase: 63 U/L (ref 39–117)
BUN: 9 mg/dL (ref 6–23)
CALCIUM: 9.7 mg/dL (ref 8.4–10.5)
CHLORIDE: 104 meq/L (ref 96–112)
CO2: 31 meq/L (ref 19–32)
Creatinine, Ser: 1.1 mg/dL (ref 0.4–1.5)
GFR: 95.4 mL/min (ref >60.00)
Glucose, Bld: 96 mg/dL (ref 70–99)
POTASSIUM: 3.7 meq/L (ref 3.5–5.1)
SODIUM: 142 meq/L (ref 135–145)
TOTAL PROTEIN: 7.3 g/dL (ref 6.0–8.3)
Total Bilirubin: 1 mg/dL (ref 0.2–1.2)

## 2013-11-11 LAB — CBC WITH DIFFERENTIAL/PLATELET
BASOS ABS: 0 10*3/uL (ref 0.0–0.1)
Basophils Relative: 0.5 % (ref 0.0–3.0)
EOS ABS: 0.1 10*3/uL (ref 0.0–0.7)
Eosinophils Relative: 1.2 % (ref 0.0–5.0)
HCT: 49.9 % (ref 39.0–52.0)
HEMOGLOBIN: 17 g/dL (ref 13.0–17.0)
LYMPHS ABS: 1.7 10*3/uL (ref 0.7–4.0)
LYMPHS PCT: 32.3 % (ref 12.0–46.0)
MCHC: 34.1 g/dL (ref 30.0–36.0)
MCV: 94.1 fl (ref 78.0–100.0)
MONOS PCT: 5.2 % (ref 3.0–12.0)
Monocytes Absolute: 0.3 10*3/uL (ref 0.1–1.0)
NEUTROS ABS: 3.3 10*3/uL (ref 1.4–7.7)
Neutrophils Relative %: 60.8 % (ref 43.0–77.0)
PLATELETS: 172 10*3/uL (ref 150.0–400.0)
RBC: 5.3 Mil/uL (ref 4.22–5.81)
RDW: 13.1 % (ref 11.5–14.6)
WBC: 5.4 10*3/uL (ref 4.5–10.5)

## 2013-11-11 LAB — LIPASE: Lipase: 31 U/L (ref 11.0–59.0)

## 2013-11-11 LAB — AMYLASE: Amylase: 92 U/L (ref 27–131)

## 2013-11-11 MED ORDER — DIVALPROEX SODIUM 500 MG PO DR TAB: 1000.0000 mg | DELAYED_RELEASE_TABLET | Freq: Every day | ORAL | Status: DC

## 2013-11-11 MED ORDER — ARIPIPRAZOLE 5 MG PO TABS: 5.0000 mg | ORAL_TABLET | Freq: Two times a day (BID) | ORAL | Status: DC

## 2013-11-11 MED ORDER — FLUOXETINE HCL 20 MG PO TABS: 20.0000 mg | ORAL_TABLET | Freq: Every day | ORAL | Status: DC

## 2013-11-11 MED ORDER — ZOLPIDEM TARTRATE 5 MG PO TABS: 5.0000 mg | ORAL_TABLET | Freq: Every evening | ORAL | Status: DC | PRN

## 2013-11-11 NOTE — Assessment & Plan Note (Signed)
I think this is anxiety induced I will check labs to look for hepatitis, pancreatitis, anemia, thyroid disease He was given reglan for this but he prefers not to treat it at this time

## 2013-11-11 NOTE — Progress Notes (Signed)
Subjective:    Patient ID: Curtis Townsend, male    DOB: [birth year], 38 y.o.   MRN: [id number]  HPI Comments: New to me he complains of intermittent nausea and vomiting caused by feeling nervous, the last episode of vomiting was about 2 weeks ago. Also, he has bipolar disease and requests a refill on his medications. He has occasional insomnia but otherwise feels like he is doing well.     Review of Systems  Constitutional: Negative.  Negative for fever, chills, diaphoresis, activity change, appetite change, fatigue and unexpected weight change.  HENT: Negative.   Eyes: Negative.   Respiratory: Negative.  Negative for cough, choking, chest tightness, shortness of breath, wheezing and stridor.   Cardiovascular: Negative.  Negative for chest pain, palpitations and leg swelling.  Gastrointestinal: Positive for nausea and vomiting. Negative for abdominal pain, diarrhea, constipation, blood in stool, abdominal distention, anal bleeding and rectal pain.  Endocrine: Negative.  Negative for polydipsia, polyphagia and polyuria.  Genitourinary: Negative.   Musculoskeletal: Negative.   Skin: Negative.   Allergic/Immunologic: Negative.   Neurological: Negative.  Negative for dizziness, tremors, seizures, syncope, weakness, light-headedness and numbness.  Hematological: Negative.  Negative for adenopathy. Does not bruise/bleed easily.  Psychiatric/Behavioral: Positive for sleep disturbance and dysphoric mood. Negative for suicidal ideas, hallucinations, behavioral problems, confusion, self-injury, decreased concentration and agitation. The patient is nervous/anxious.        Objective:   Physical Exam  Vitals reviewed. Constitutional: He is oriented to person, place, and time. He appears well-developed and well-nourished.  Non-toxic appearance. He does not have a sickly appearance. He does not appear ill. No distress.  HENT:  Head: Normocephalic and atraumatic.  Mouth/Throat: Oropharynx is  clear and moist. No oropharyngeal exudate.  Eyes: Conjunctivae are normal. Right eye exhibits no discharge. Left eye exhibits no discharge. No scleral icterus.  Neck: Normal range of motion. Neck supple. No JVD present. No tracheal deviation present. No thyromegaly present.  Cardiovascular: Normal rate, regular rhythm, normal heart sounds and intact distal pulses.  Exam reveals no gallop and no friction rub.   No murmur heard. Pulmonary/Chest: Effort normal and breath sounds normal. No stridor. No respiratory distress. He has no wheezes. He has no rales. He exhibits no tenderness.  Abdominal: Soft. Normal appearance and bowel sounds are normal. He exhibits no shifting dullness, no distension, no fluid wave, no ascites, no pulsatile midline mass and no mass. There is no hepatosplenomegaly, splenomegaly or hepatomegaly. There is no tenderness. There is no rebound, no guarding and no CVA tenderness. No hernia. Hernia confirmed negative in the ventral area, confirmed negative in the right inguinal area and confirmed negative in the left inguinal area.  Genitourinary: Testes normal and penis normal. Right testis shows no mass, no swelling and no tenderness. Right testis is descended. Left testis shows no mass, no swelling and no tenderness. Left testis is descended. Circumcised. No penile erythema or penile tenderness. No discharge found.  Musculoskeletal: Normal range of motion. He exhibits no edema and no tenderness.  Lymphadenopathy:    He has no cervical adenopathy.       Right: No inguinal adenopathy present.       Left: No inguinal adenopathy present.  Neurological: He is oriented to person, place, and time.  Skin: Skin is warm and dry. No rash noted. He is not diaphoretic. No erythema. No pallor.  Psychiatric: He has a normal mood and affect. His behavior is normal. Judgment and thought content normal. His mood appears not  anxious. His affect is not angry, not blunt, not labile and not  inappropriate. His speech is not rapid and/or pressured, not delayed, not tangential and not slurred. Cognition and memory are normal. He does not exhibit a depressed mood. He expresses no homicidal and no suicidal ideation. He expresses no suicidal plans and no homicidal plans. He is communicative.      Lab Results  Component Value Date   WBC 6.3 08/02/2013   HGB 15.1 08/02/2013   HCT 43.4 08/02/2013   PLT 178 08/02/2013   GLUCOSE 90 08/02/2013   CHOL 177 09/02/2012   TRIG 49.0 09/02/2012   HDL 54.00 09/02/2012   LDLCALC 113* 09/02/2012   ALT 15 08/02/2013   AST 19 08/02/2013   NA 146 08/02/2013   K 3.7 08/02/2013   CL 107 08/02/2013   CREATININE 1.04 08/02/2013   BUN 7 08/02/2013   CO2 24 08/02/2013   TSH 0.38 10/16/2012   HGBA1C 5.7 09/02/2012      Assessment & Plan:

## 2013-11-11 NOTE — Assessment & Plan Note (Signed)
meds refilled today, cont the same since he is doing well I will check his valproic acid level

## 2013-11-11 NOTE — Patient Instructions (Signed)

## 2013-11-11 NOTE — Assessment & Plan Note (Signed)
His exam was normal and he did not complain related to this I have asked him to return for further testing (GC, chlamydia, syph, HIV) - will treat as indicated

## 2013-11-11 NOTE — Progress Notes (Signed)
Pre visit review using our clinic review tool, if applicable. No additional management support is needed unless otherwise documented below in the visit note. 

## 2013-11-12 ENCOUNTER — Other Ambulatory Visit: Payer: Federal, State, Local not specified - PPO

## 2013-11-12 DIAGNOSIS — R8281 Pyuria: Secondary | ICD-10-CM

## 2013-11-12 LAB — DRUGS OF ABUSE SCREEN W/O ALC, ROUTINE URINE
Amphetamine Screen, Ur: NEGATIVE
BENZODIAZEPINES.: NEGATIVE
Barbiturate Quant, Ur: NEGATIVE
CREATININE, U: 542.4 mg/dL
Cocaine Metabolites: NEGATIVE
MARIJUANA METABOLITE: POSITIVE — AB
METHADONE: NEGATIVE
OPIATE SCREEN, URINE: NEGATIVE
PHENCYCLIDINE (PCP): NEGATIVE
Propoxyphene: NEGATIVE

## 2013-11-13 ENCOUNTER — Encounter: Payer: Self-pay | Admitting: Internal Medicine

## 2013-11-13 LAB — GC/CHLAMYDIA PROBE AMP, URINE
Chlamydia, Swab/Urine, PCR: NEGATIVE
GC Probe Amp, Urine: NEGATIVE

## 2013-11-13 LAB — RPR

## 2013-11-13 LAB — VALPROIC ACID LEVEL: Valproic Acid Lvl: <12.5 ug/mL — ABNORMAL LOW (ref 50.0–100.0)

## 2013-11-15 LAB — CANNABANOIDS (GC/LC/MS), URINE: THC-COOH (GC/LC/MS), ur confirm: >1500 ng/mL — ABNORMAL HIGH (ref <5)

## 2013-11-17 ENCOUNTER — Emergency Department (HOSPITAL_COMMUNITY)
Admission: EM | Admit: 2013-11-17 | Discharge: 2013-11-18 | Disposition: A | Discharge status: Other Acute Inpatient Hospital | Payer: Federal, State, Local not specified - PPO | Attending: Emergency Medicine | Admitting: Emergency Medicine

## 2013-11-17 ENCOUNTER — Encounter (HOSPITAL_COMMUNITY): Payer: Self-pay | Admitting: Emergency Medicine

## 2013-11-17 DIAGNOSIS — Z79899 Other long term (current) drug therapy: Secondary | ICD-10-CM | POA: Insufficient documentation

## 2013-11-17 DIAGNOSIS — Z8719 Personal history of other diseases of the digestive system: Secondary | ICD-10-CM | POA: Insufficient documentation

## 2013-11-17 DIAGNOSIS — Z8619 Personal history of other infectious and parasitic diseases: Secondary | ICD-10-CM | POA: Insufficient documentation

## 2013-11-17 DIAGNOSIS — F319 Bipolar disorder, unspecified: Secondary | ICD-10-CM

## 2013-11-17 DIAGNOSIS — T50902A Poisoning by unspecified drugs, medicaments and biological substances, intentional self-harm, initial encounter: Secondary | ICD-10-CM | POA: Diagnosis not present

## 2013-11-17 DIAGNOSIS — Z8709 Personal history of other diseases of the respiratory system: Secondary | ICD-10-CM | POA: Insufficient documentation

## 2013-11-17 DIAGNOSIS — F3130 Bipolar disorder, current episode depressed, mild or moderate severity, unspecified: Secondary | ICD-10-CM | POA: Diagnosis present

## 2013-11-17 DIAGNOSIS — F17200 Nicotine dependence, unspecified, uncomplicated: Secondary | ICD-10-CM | POA: Insufficient documentation

## 2013-11-17 DIAGNOSIS — Z872 Personal history of diseases of the skin and subcutaneous tissue: Secondary | ICD-10-CM | POA: Insufficient documentation

## 2013-11-17 DIAGNOSIS — F329 Major depressive disorder, single episode, unspecified: Secondary | ICD-10-CM | POA: Insufficient documentation

## 2013-11-17 DIAGNOSIS — R45851 Suicidal ideations: Secondary | ICD-10-CM | POA: Diagnosis not present

## 2013-11-17 DIAGNOSIS — E119 Type 2 diabetes mellitus without complications: Secondary | ICD-10-CM | POA: Insufficient documentation

## 2013-11-17 DIAGNOSIS — Z87442 Personal history of urinary calculi: Secondary | ICD-10-CM | POA: Insufficient documentation

## 2013-11-17 DIAGNOSIS — F1210 Cannabis abuse, uncomplicated: Secondary | ICD-10-CM | POA: Insufficient documentation

## 2013-11-17 DIAGNOSIS — F411 Generalized anxiety disorder: Secondary | ICD-10-CM | POA: Insufficient documentation

## 2013-11-17 DIAGNOSIS — R4182 Altered mental status, unspecified: Secondary | ICD-10-CM | POA: Diagnosis not present

## 2013-11-17 DIAGNOSIS — T50901A Poisoning by unspecified drugs, medicaments and biological substances, accidental (unintentional), initial encounter: Secondary | ICD-10-CM | POA: Diagnosis not present

## 2013-11-17 DIAGNOSIS — F3289 Other specified depressive episodes: Secondary | ICD-10-CM | POA: Insufficient documentation

## 2013-11-17 LAB — CBC WITH DIFFERENTIAL/PLATELET
Basophils Absolute: 0 10*3/uL (ref 0.0–0.1)
Basophils Relative: 0 % (ref 0–1)
Eosinophils Absolute: 0 10*3/uL (ref 0.0–0.7)
Eosinophils Relative: 1 % (ref 0–5)
HCT: 46.5 % (ref 39.0–52.0)
HEMOGLOBIN: 16.1 g/dL (ref 13.0–17.0)
LYMPHS ABS: 2.1 10*3/uL (ref 0.7–4.0)
Lymphocytes Relative: 49 % — ABNORMAL HIGH (ref 12–46)
MCH: 31.6 pg (ref 26.0–34.0)
MCHC: 34.6 g/dL (ref 30.0–36.0)
MCV: 91.2 fL (ref 78.0–100.0)
MONOS PCT: 9 % (ref 3–12)
Monocytes Absolute: 0.4 10*3/uL (ref 0.1–1.0)
NEUTROS ABS: 1.8 10*3/uL (ref 1.7–7.7)
NEUTROS PCT: 41 % — AB (ref 43–77)
Platelets: 167 10*3/uL (ref 150–400)
RBC: 5.1 MIL/uL (ref 4.22–5.81)
RDW: 12.3 % (ref 11.5–15.5)
WBC: 4.3 10*3/uL (ref 4.0–10.5)

## 2013-11-17 LAB — VALPROIC ACID LEVEL: Valproic Acid Lvl: <10 ug/mL — ABNORMAL LOW (ref 50.0–100.0)

## 2013-11-17 LAB — RAPID URINE DRUG SCREEN, HOSP PERFORMED
AMPHETAMINES: NOT DETECTED
Barbiturates: NOT DETECTED
Benzodiazepines: NOT DETECTED
Cocaine: NOT DETECTED
OPIATES: NOT DETECTED
TETRAHYDROCANNABINOL: POSITIVE — AB

## 2013-11-17 LAB — COMPREHENSIVE METABOLIC PANEL
ALK PHOS: 69 U/L (ref 39–117)
ALT: 17 U/L (ref 0–53)
AST: 21 U/L (ref 0–37)
Albumin: 3.9 g/dL (ref 3.5–5.2)
BILIRUBIN TOTAL: 0.8 mg/dL (ref 0.3–1.2)
BUN: 7 mg/dL (ref 6–23)
CO2: 28 meq/L (ref 19–32)
Calcium: 9.6 mg/dL (ref 8.4–10.5)
Chloride: 102 mEq/L (ref 96–112)
Creatinine, Ser: 1 mg/dL (ref 0.50–1.35)
GFR calc Af Amer: >90 mL/min (ref >90)
GFR calc non Af Amer: >90 mL/min (ref >90)
GLUCOSE: 79 mg/dL (ref 70–99)
POTASSIUM: 4.3 meq/L (ref 3.7–5.3)
Sodium: 141 mEq/L (ref 137–147)
Total Protein: 7.1 g/dL (ref 6.0–8.3)

## 2013-11-17 LAB — SALICYLATE LEVEL: Salicylate Lvl: <2 mg/dL — ABNORMAL LOW (ref 2.8–20.0)

## 2013-11-17 LAB — ETHANOL: Alcohol, Ethyl (B): <11 mg/dL (ref 0–11)

## 2013-11-17 LAB — ACETAMINOPHEN LEVEL: Acetaminophen (Tylenol), Serum: <15 ug/mL (ref 10–30)

## 2013-11-17 MED ORDER — SODIUM CHLORIDE 0.9 % IV BOLUS (SEPSIS)
500.0000 mL | Freq: Once | INTRAVENOUS | Status: AC
  Administered 2013-11-17: 500 mL via INTRAVENOUS

## 2013-11-17 MED ORDER — FLUOXETINE HCL 10 MG PO CAPS
10.0000 mg | ORAL_CAPSULE | Freq: Every day | ORAL | Status: DC
  Administered 2013-11-17: 10 mg via ORAL
  Filled 2013-11-17 (×2): qty 1

## 2013-11-17 MED ORDER — ARIPIPRAZOLE 5 MG PO TABS
5.0000 mg | ORAL_TABLET | Freq: Two times a day (BID) | ORAL | Status: DC
  Administered 2013-11-17 – 2013-11-18 (×2): 5 mg via ORAL
  Filled 2013-11-17 (×5): qty 1

## 2013-11-17 MED ORDER — ACETAMINOPHEN 325 MG PO TABS: 650.0000 mg | ORAL_TABLET | ORAL | Status: DC | PRN

## 2013-11-17 MED ORDER — IBUPROFEN 200 MG PO TABS: 600.0000 mg | ORAL_TABLET | Freq: Three times a day (TID) | ORAL | Status: DC | PRN

## 2013-11-17 MED ORDER — FLUOXETINE HCL 20 MG PO CAPS
20.0000 mg | ORAL_CAPSULE | Freq: Every day | ORAL | Status: DC
  Administered 2013-11-18: 20 mg via ORAL
  Filled 2013-11-17: qty 1

## 2013-11-17 MED ORDER — FLUOXETINE HCL 20 MG PO TABS
20.0000 mg | ORAL_TABLET | Freq: Every day | ORAL | Status: DC
  Filled 2013-11-17: qty 1

## 2013-11-17 MED ORDER — DIVALPROEX SODIUM 500 MG PO DR TAB
1000.0000 mg | DELAYED_RELEASE_TABLET | Freq: Every day | ORAL | Status: DC
  Administered 2013-11-17: 1000 mg via ORAL
  Filled 2013-11-17: qty 2

## 2013-11-17 NOTE — ED Notes (Signed)
Patient and belongings have been wanded by security. Items are in locker 30 in [place]

## 2013-11-17 NOTE — ED Notes (Addendum)
Pt's wife: [name] [phone].

## 2013-11-17 NOTE — ED Notes (Signed)
  Patient belongings.Marland KitchenMarland KitchenShorts, belt, shirt and shoes.
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
## 2013-11-17 NOTE — ED Notes (Signed)
Items moved to locker 32

## 2013-11-17 NOTE — Consult Note (Signed)
[facility] Face-to-Face Psychiatry Consult   Reason for Consult:  Intentional overdose Referring Physician:  EDP Curtis Townsend is an 38 y.o. male. Total Time spent with patient: 15 minutes  Assessment: AXIS I:  Bipolar, Depressed AXIS II:  Deferred AXIS III:   Past Medical History  Diagnosis Date  . Bipolar 1 disorder   . Hyperlipemia     no current med.  . Seasonal allergies   . History of kidney stones   . Wears partial dentures     upper  . Lipoma of back 05/2012    right  . Depression   . History of chicken pox   . Alcoholism   . Diabetes   . Depression   . Anxiety   . IBS (irritable bowel syndrome)    AXIS IV:  economic problems, occupational problems, other psychosocial or environmental problems, problems related to social environment and problems with primary support group AXIS V:  41-50 serious symptoms  Plan:  Supportive therapy provided about ongoing stressors.Medications restarted, re-evaluate in the am--patient prefers outpatient therapy after medications restarted--agrees to stay over night.  Subjective:   Curtis Townsend is a 38 y.o. male patient will start psychiatric medications and re-evaluated in the am.  HPI:  Patient was brought to the ED after overdosing on benzodiazepines, vomited shortly after taking them.  He admits to an intentional overdose.  Poor sleep since January.  Curtis Townsend has been unemployed for a year and depression increasing with financial strain.  He lives with his wife, mother, and two children (ages 62 and 51).  Denies current suicidal/homicidal ideations and hallucinations.  Mylin has not been taking his medications, stated he just got them refilled--goes to [place].  Diagnosis of bipolar disorder and smokes marijuana, joint daily if he can afford it.  Irritable on exam with poor eye contact.  He does not want inpatient hospitalization, says it was not helpful in the past--"just need my meds."  Curtis Townsend was at [facility] in January and has been in  psychiatric hospitals in [place].  HPI Elements:   Location:  generalized. Quality:  acute. Severity:  severe. Timing:  intermittent. Duration:  past week. Context:  stressors--unemployed, financial issues, relationship issues.  Past Psychiatric History: Past Medical History  Diagnosis Date  . Bipolar 1 disorder   . Hyperlipemia     no current med.  . Seasonal allergies   . History of kidney stones   . Wears partial dentures     upper  . Lipoma of back 05/2012    right  . Depression   . History of chicken pox   . Alcoholism   . Diabetes   . Depression   . Anxiety   . IBS (irritable bowel syndrome)     reports that he has been smoking Cigarettes.  He has a 4 pack-year smoking history. He has never used smokeless tobacco. He reports that he uses illicit drugs (Marijuana). He reports that he does not drink alcohol. Family History  Problem Relation Age of Onset  . Stroke Mother   . Hypertension Mother   . Diabetes Mother   . Hypertension Father   . Diabetes Father   . Cancer Paternal Uncle     Prostate Cancer           Allergies:   Allergies  Allergen Reactions  . Risperidone And Related Anaphylaxis    ACT Assessment Complete:  Yes:    Educational Status    Risk to Self: Risk to self Is patient  at risk for suicide?: Yes Substance abuse history and/or treatment for substance abuse?: Yes (+ THC)  Risk to Others:    Abuse:    Prior Inpatient Therapy:    Prior Outpatient Therapy:    Additional Information:                    Objective: Blood pressure 123/79, pulse 95, temperature 98.1 F (36.7 C), temperature source Oral, resp. rate 16, SpO2 99.00%.There is no weight on file to calculate BMI. Results for orders placed during the hospital encounter of 11/17/13 (from the past 72 hour(s))  CBC WITH DIFFERENTIAL     Status: Abnormal   Collection Time    11/17/13  3:45 PM      Result Value Ref Range   WBC 4.3  4.0 - 10.5 K/uL   RBC 5.10  4.22 - 5.81  MIL/uL   Hemoglobin 16.1  13.0 - 17.0 g/dL   HCT 46.5  39.0 - 52.0 %   MCV 91.2  78.0 - 100.0 fL   MCH 31.6  26.0 - 34.0 pg   MCHC 34.6  30.0 - 36.0 g/dL   RDW 12.3  11.5 - 15.5 %   Platelets 167  150 - 400 K/uL   Neutrophils Relative % 41 (*) 43 - 77 %   Neutro Abs 1.8  1.7 - 7.7 K/uL   Lymphocytes Relative 49 (*) 12 - 46 %   Lymphs Abs 2.1  0.7 - 4.0 K/uL   Monocytes Relative 9  3 - 12 %   Monocytes Absolute 0.4  0.1 - 1.0 K/uL   Eosinophils Relative 1  0 - 5 %   Eosinophils Absolute 0.0  0.0 - 0.7 K/uL   Basophils Relative 0  0 - 1 %   Basophils Absolute 0.0  0.0 - 0.1 K/uL  COMPREHENSIVE METABOLIC PANEL     Status: None   Collection Time    11/17/13  3:45 PM      Result Value Ref Range   Sodium 141  137 - 147 mEq/L   Potassium 4.3  3.7 - 5.3 mEq/L   Chloride 102  96 - 112 mEq/L   CO2 28  19 - 32 mEq/L   Glucose, Bld 79  70 - 99 mg/dL   BUN 7  6 - 23 mg/dL   Creatinine, Ser 1.00  0.50 - 1.35 mg/dL   Calcium 9.6  8.4 - 10.5 mg/dL   Total Protein 7.1  6.0 - 8.3 g/dL   Albumin 3.9  3.5 - 5.2 g/dL   AST 21  0 - 37 U/L   Comment: SLIGHT HEMOLYSIS     HEMOLYSIS AT THIS LEVEL MAY AFFECT RESULT   ALT 17  0 - 53 U/L   Alkaline Phosphatase 69  39 - 117 U/L   Total Bilirubin 0.8  0.3 - 1.2 mg/dL   GFR calc non Af Amer >90  >90 mL/min   GFR calc Af Amer >90  >90 mL/min   Comment: (NOTE)     The eGFR has been calculated using the CKD EPI equation.     This calculation has not been validated in all clinical situations.     eGFR's persistently <90 mL/min signify possible Chronic Kidney     Disease.  ETHANOL     Status: None   Collection Time    11/17/13  3:45 PM      Result Value Ref Range   Alcohol, Ethyl (B) <11  0 - 11  mg/dL   Comment:            LOWEST DETECTABLE LIMIT FOR     SERUM ALCOHOL IS 11 mg/dL     FOR MEDICAL PURPOSES ONLY  ACETAMINOPHEN LEVEL     Status: None   Collection Time    11/17/13  3:45 PM      Result Value Ref Range   Acetaminophen (Tylenol), Serum  <15.0  10 - 30 ug/mL   Comment:            THERAPEUTIC CONCENTRATIONS VARY     SIGNIFICANTLY. A RANGE OF 10-30     ug/mL MAY BE AN EFFECTIVE     CONCENTRATION FOR MANY PATIENTS.     HOWEVER, SOME ARE BEST TREATED     AT CONCENTRATIONS OUTSIDE THIS     RANGE.     ACETAMINOPHEN CONCENTRATIONS     >150 ug/mL AT 4 HOURS AFTER     INGESTION AND >50 ug/mL AT 12     HOURS AFTER INGESTION ARE     OFTEN ASSOCIATED WITH TOXIC     REACTIONS.  SALICYLATE LEVEL     Status: Abnormal   Collection Time    11/17/13  3:45 PM      Result Value Ref Range   Salicylate Lvl <4.8 (*) 2.8 - 20.0 mg/dL  VALPROIC ACID LEVEL     Status: Abnormal   Collection Time    11/17/13  3:45 PM      Result Value Ref Range   Valproic Acid Lvl <10.0 (*) 50.0 - 100.0 ug/mL   Comment: Performed at [facility]     Status: Abnormal   Collection Time    11/17/13  6:16 PM      Result Value Ref Range   Opiates NONE DETECTED  NONE DETECTED   Cocaine NONE DETECTED  NONE DETECTED   Benzodiazepines NONE DETECTED  NONE DETECTED   Amphetamines NONE DETECTED  NONE DETECTED   Tetrahydrocannabinol POSITIVE (*) NONE DETECTED   Barbiturates NONE DETECTED  NONE DETECTED   Comment:            DRUG SCREEN FOR MEDICAL PURPOSES     ONLY.  IF CONFIRMATION IS NEEDED     FOR ANY PURPOSE, NOTIFY LAB     WITHIN 5 DAYS.                LOWEST DETECTABLE LIMITS     FOR URINE DRUG SCREEN     Drug Class       Cutoff (ng/mL)     Amphetamine      1000     Barbiturate      200     Benzodiazepine   546     Tricyclics       270     Opiates          300     Cocaine          300     THC              50   Labs are reviewed and are pertinent for no medical issues noted.  Current Facility-Administered Medications  Medication Dose Route Frequency Provider Last Rate Last Dose  . acetaminophen (TYLENOL) tablet 650 mg  650 mg Oral Q4H PRN Jasper Riling. Pickering, MD      . ARIPiprazole (ABILIFY)  tablet 5 mg  5 mg Oral BID Jasper Riling. Alvino Chapel, MD      .  divalproex (DEPAKOTE) DR tablet 1,000 mg  1,000 mg Oral QHS Nathan R. Pickering, MD      . FLUoxetine (PROZAC) capsule 10 mg  10 mg Oral QHS Waylan Boga, NP      . Derrill Memo ON 11/18/2013] FLUoxetine (PROZAC) capsule 20 mg  20 mg Oral Daily Waylan Boga, NP      . ibuprofen (ADVIL,MOTRIN) tablet 600 mg  600 mg Oral Q8H PRN Jasper Riling. Alvino Chapel, MD       Current Outpatient Prescriptions  Medication Sig Dispense Refill  . Multiple Vitamins-Minerals (MULTIVITAMIN WITH MINERALS) tablet Take 1 tablet by mouth daily.      Marland Kitchen zolpidem (AMBIEN) 5 MG tablet Take 1 tablet (5 mg total) by mouth at bedtime as needed for sleep.  30 tablet  2  . ARIPiprazole (ABILIFY) 5 MG tablet Take 1 tablet (5 mg total) by mouth 2 (two) times daily.  60 tablet  5  . divalproex (DEPAKOTE) 500 MG DR tablet Take 2 tablets (1,000 mg total) by mouth at bedtime.  60 tablet  5  . FLUoxetine (PROZAC) 20 MG tablet Take 1 tablet (20 mg total) by mouth daily.  90 tablet  1    Psychiatric Specialty Exam:     Blood pressure 123/79, pulse 95, temperature 98.1 F (36.7 C), temperature source Oral, resp. rate 16, SpO2 99.00%.There is no weight on file to calculate BMI.  General Appearance: Disheveled  Eye Sport and exercise psychologist::  Fair  Speech:  Normal Rate  Volume:  Normal  Mood:  Depressed and Irritable  Affect:  Depressed  Thought Process:  Coherent  Orientation:  Full (Time, Place, and Person)  Thought Content:  Rumination  Suicidal Thoughts:  Yes.  with intent/plan  Homicidal Thoughts:  No  Memory:  Immediate;   Fair Recent;   Fair Remote;   Fair  Judgement:  Poor  Insight:  Fair  Psychomotor Activity:  Decreased  Concentration:  Fair  Recall:  [employer] [place]: Fair  Akathisia:  No  Handed:  Right  AIMS (if indicated):     Assets:  Housing Leisure Time Physical Health Resilience Social Support  Sleep:      Musculoskeletal: Strength & Muscle  Tone: within normal limits Gait & Station: normal Patient leans: N/A  Treatment Plan Summary: Daily contact with patient to assess and evaluate symptoms and progress in treatment Medication management--medications restarted, re-evaluate in the am  Waylan Boga, PMH-NP 11/17/2013 10:20 PM Reveiwed notes and agree with plan.

## 2013-11-17 NOTE — ED Provider Notes (Signed)
CSN: [id number]     Arrival date & time 11/17/13  1459 History   First MD Initiated Contact with Patient 11/17/13 1518     Chief Complaint  Patient presents with  . Medical Clearance  . Drug Overdose    Level V caveat due to altered mental status (Consider location/radiation/quality/duration/timing/severity/associated sxs/prior Treatment) Patient is a 38 y.o. male presenting with Overdose. The history is provided by the patient.  Drug Overdose This is a recurrent problem. Pertinent negatives include no chest pain and no abdominal pain.  patient overdosed on 12 of his "benzo" in a suicide attempt. He states that he has a lot of things going on in his life. He states he also drank a little more like her. He denies other intake. He is done the same thing previously. He states he does not know what the medicine was except as in benzo on it. Denies hallucinations. He denies chest pain or trouble breathing. He is mildly sedated somewhat uncooperative with exam.  Past Medical History  Diagnosis Date  . Bipolar 1 disorder   . Hyperlipemia     no current med.  . Seasonal allergies   . History of kidney stones   . Wears partial dentures     upper  . Lipoma of back 05/2012    right  . Depression   . History of chicken pox   . Alcoholism   . Diabetes   . Depression   . Anxiety   . IBS (irritable bowel syndrome)    Past Surgical History  Procedure Laterality Date  . Lipoma excision  06/10/2012    Procedure: EXCISION LIPOMA;  Surgeon: Ralene Ok, MD;  Location: [facility];  Service: General;  Laterality: Right;  excision of back lipoma on right 3x3cm   Family History  Problem Relation Age of Onset  . Stroke Mother   . Hypertension Mother   . Diabetes Mother   . Hypertension Father   . Diabetes Father   . Cancer Paternal Uncle     Prostate Cancer   History  Substance Use Topics  . Smoking status: Current Every Day Smoker -- 0.25 packs/day for 16 years   Types: Cigarettes  . Smokeless tobacco: Never Used     Comment: 1-2 cig./month  . Alcohol Use: No     Comment: pt states daily vodka "till bottle empty"    Review of Systems  Cardiovascular: Negative for chest pain.  Gastrointestinal: Negative for abdominal pain.  Psychiatric/Behavioral: Positive for suicidal ideas.      Allergies  Risperidone and related  Home Medications   Prior to Admission medications   Medication Sig Start Date End Date Taking? Authorizing Provider  Multiple Vitamins-Minerals (MULTIVITAMIN WITH MINERALS) tablet Take 1 tablet by mouth daily. 08/09/13  Yes Waylan Boga, NP  zolpidem (AMBIEN) 5 MG tablet Take 1 tablet (5 mg total) by mouth at bedtime as needed for sleep. 11/11/13  Yes Janith Lima, MD  ARIPiprazole (ABILIFY) 5 MG tablet Take 1 tablet (5 mg total) by mouth 2 (two) times daily. 11/11/13   Janith Lima, MD  divalproex (DEPAKOTE) 500 MG DR tablet Take 2 tablets (1,000 mg total) by mouth at bedtime. 11/11/13   Janith Lima, MD  FLUoxetine (PROZAC) 20 MG tablet Take 1 tablet (20 mg total) by mouth daily. 11/11/13   Janith Lima, MD   BP 123/79  Pulse 95  Temp(Src) 98.1 F (36.7 C) (Oral)  Resp 16  SpO2 99% Physical Exam  Nursing note and vitals reviewed. Constitutional: He is oriented to person, place, and time. He appears well-developed and well-nourished.  HENT:  Head: Normocephalic and atraumatic.  Eyes: EOM are normal. Pupils are equal, round, and reactive to light.  Neck: Normal range of motion. Neck supple.  Cardiovascular: Normal rate, regular rhythm and normal heart sounds.   No murmur heard. Pulmonary/Chest: Effort normal and breath sounds normal.  Abdominal: Soft. Bowel sounds are normal. He exhibits no distension and no mass. There is no tenderness. There is no rebound and no guarding.  Musculoskeletal: Normal range of motion. He exhibits no edema.  Neurological: He is alert and oriented to person, place, and time. No cranial nerve  deficit.  Patient is somewhat slow to answer.  Skin: Skin is warm and dry.  Psychiatric:  Patient appears somewhat depressed    ED Course  Procedures (including critical care time) Labs Review Labs Reviewed  CBC WITH DIFFERENTIAL - Abnormal; Notable for the following:    Neutrophils Relative % 41 (*)    Lymphocytes Relative 49 (*)    All other components within normal limits  SALICYLATE LEVEL - Abnormal; Notable for the following:    Salicylate Lvl <0.9 (*)    All other components within normal limits  VALPROIC ACID LEVEL - Abnormal; Notable for the following:    Valproic Acid Lvl <10.0 (*)    All other components within normal limits  URINE RAPID DRUG SCREEN (HOSP PERFORMED) - Abnormal; Notable for the following:    Tetrahydrocannabinol POSITIVE (*)    All other components within normal limits  COMPREHENSIVE METABOLIC PANEL  ETHANOL  ACETAMINOPHEN LEVEL    Imaging Review No results found.   EKG Interpretation   Date/Time:  Monday Nov 17 2013 15:05:57 EDT Ventricular Rate:  67 PR Interval:  140 QRS Duration: 84 QT Interval:  376 QTC Calculation: 397 R Axis:   81 Text Interpretation:  Sinus rhythm ST elev, probable normal early repol  pattern Baseline wander in lead(s) V2 Confirmed by Alvino Chapel  MD, Ovid Curd  [phone]) on 11/17/2013 3:40:52 PM      MDM   Final diagnoses:  Bipolar disorder, unspecified    Patient presents with reported overdose. States that he took 12 "benzos". He states it was his medication.    urine drug screen does not show benzodiazepine. Patient is awake and appropriate this time. He appears to be cleared medically. Will be seen by TTS.  Jasper Riling. Alvino Chapel, MD 11/17/13 2037

## 2013-11-18 ENCOUNTER — Inpatient Hospital Stay (HOSPITAL_COMMUNITY)
Admission: AD | Admit: 2013-11-18 | Discharge: 2013-11-24 | DRG: 885 | Disposition: A | Discharge status: Home / Self Care | Payer: Federal, State, Local not specified - PPO | Source: Intra-hospital | Attending: Psychiatry | Admitting: Psychiatry

## 2013-11-18 ENCOUNTER — Encounter (HOSPITAL_COMMUNITY): Payer: Self-pay

## 2013-11-18 DIAGNOSIS — R45851 Suicidal ideations: Secondary | ICD-10-CM

## 2013-11-18 DIAGNOSIS — T50902A Poisoning by unspecified drugs, medicaments and biological substances, intentional self-harm, initial encounter: Secondary | ICD-10-CM | POA: Diagnosis not present

## 2013-11-18 DIAGNOSIS — G4700 Insomnia, unspecified: Secondary | ICD-10-CM | POA: Diagnosis present

## 2013-11-18 DIAGNOSIS — E119 Type 2 diabetes mellitus without complications: Secondary | ICD-10-CM | POA: Diagnosis present

## 2013-11-18 DIAGNOSIS — E785 Hyperlipidemia, unspecified: Secondary | ICD-10-CM | POA: Diagnosis present

## 2013-11-18 DIAGNOSIS — Z833 Family history of diabetes mellitus: Secondary | ICD-10-CM

## 2013-11-18 DIAGNOSIS — Z823 Family history of stroke: Secondary | ICD-10-CM

## 2013-11-18 DIAGNOSIS — F3130 Bipolar disorder, current episode depressed, mild or moderate severity, unspecified: Secondary | ICD-10-CM | POA: Diagnosis not present

## 2013-11-18 DIAGNOSIS — Z91199 Patient's noncompliance with other medical treatment and regimen due to unspecified reason: Secondary | ICD-10-CM

## 2013-11-18 DIAGNOSIS — F411 Generalized anxiety disorder: Secondary | ICD-10-CM | POA: Diagnosis present

## 2013-11-18 DIAGNOSIS — Z8249 Family history of ischemic heart disease and other diseases of the circulatory system: Secondary | ICD-10-CM

## 2013-11-18 DIAGNOSIS — K589 Irritable bowel syndrome without diarrhea: Secondary | ICD-10-CM | POA: Diagnosis present

## 2013-11-18 DIAGNOSIS — T50901A Poisoning by unspecified drugs, medicaments and biological substances, accidental (unintentional), initial encounter: Secondary | ICD-10-CM | POA: Diagnosis not present

## 2013-11-18 DIAGNOSIS — Z9119 Patient's noncompliance with other medical treatment and regimen: Secondary | ICD-10-CM

## 2013-11-18 DIAGNOSIS — Z598 Other problems related to housing and economic circumstances: Secondary | ICD-10-CM

## 2013-11-18 DIAGNOSIS — F1210 Cannabis abuse, uncomplicated: Secondary | ICD-10-CM | POA: Diagnosis present

## 2013-11-18 DIAGNOSIS — Z5987 Material hardship due to limited financial resources, not elsewhere classified: Secondary | ICD-10-CM

## 2013-11-18 DIAGNOSIS — Z87442 Personal history of urinary calculi: Secondary | ICD-10-CM

## 2013-11-18 DIAGNOSIS — F17200 Nicotine dependence, unspecified, uncomplicated: Secondary | ICD-10-CM | POA: Diagnosis present

## 2013-11-18 DIAGNOSIS — F319 Bipolar disorder, unspecified: Secondary | ICD-10-CM

## 2013-11-18 DIAGNOSIS — Z8042 Family history of malignant neoplasm of prostate: Secondary | ICD-10-CM

## 2013-11-18 LAB — GLUCOSE, CAPILLARY: GLUCOSE-CAPILLARY: 94 mg/dL (ref 70–99)

## 2013-11-18 MED ORDER — FLUOXETINE HCL 20 MG PO CAPS
20.0000 mg | ORAL_CAPSULE | Freq: Every day | ORAL | Status: DC
  Administered 2013-11-19 – 2013-11-21 (×3): 20 mg via ORAL
  Filled 2013-11-18 (×5): qty 1

## 2013-11-18 MED ORDER — ARIPIPRAZOLE 5 MG PO TABS
5.0000 mg | ORAL_TABLET | Freq: Two times a day (BID) | ORAL | Status: DC
  Administered 2013-11-18 – 2013-11-24 (×12): 5 mg via ORAL
  Filled 2013-11-18 (×3): qty 1
  Filled 2013-11-18: qty 6
  Filled 2013-11-18 (×2): qty 1
  Filled 2013-11-18: qty 6
  Filled 2013-11-18 (×11): qty 1

## 2013-11-18 MED ORDER — ACETAMINOPHEN 325 MG PO TABS: 650.0000 mg | ORAL_TABLET | Freq: Four times a day (QID) | ORAL | Status: DC | PRN

## 2013-11-18 MED ORDER — ALUM & MAG HYDROXIDE-SIMETH 200-200-20 MG/5ML PO SUSP: 30.0000 mL | ORAL | Status: DC | PRN

## 2013-11-18 MED ORDER — DIVALPROEX SODIUM 500 MG PO DR TAB
1000.0000 mg | DELAYED_RELEASE_TABLET | Freq: Every day | ORAL | Status: DC
  Administered 2013-11-18 – 2013-11-23 (×6): 1000 mg via ORAL
  Filled 2013-11-18 (×5): qty 2
  Filled 2013-11-18: qty 6
  Filled 2013-11-18 (×4): qty 2

## 2013-11-18 MED ORDER — ACETAMINOPHEN 325 MG PO TABS
650.0000 mg | ORAL_TABLET | ORAL | Status: DC | PRN
  Administered 2013-11-19: 650 mg via ORAL
  Filled 2013-11-18: qty 2

## 2013-11-18 MED ORDER — MAGNESIUM HYDROXIDE 400 MG/5ML PO SUSP: 30.0000 mL | Freq: Every day | ORAL | Status: DC | PRN

## 2013-11-18 NOTE — BH Assessment (Signed)
Pt initially accepted to [facility]. However, patient became upset that his spouse would not be able to visit him. Sts that they are having financial issues and she does not have gas money to travel to [facility].   Meanwhile, patient was instead accepted to [facility] by Dr. Lovena Le and Delphia Grates Rankin,NP pending a 500 hall bed assignment. No beds available at this time per [facility]. Pt will have a appropriate bed available after 2pm.   [facility] informed that bed is no longer needed.

## 2013-11-18 NOTE — ED Notes (Signed)
Gave report to [employer] at [place], tx called

## 2013-11-18 NOTE — BH Assessment (Signed)
11/18/2013  Admit per Dr. Lovena Le. No appropriate beds at [facility] (500 hall). Pt referred to [facility], [facility], [place] [facility], [facility], [facility], [facility], [facility], and [facility].

## 2013-11-18 NOTE — Consult Note (Signed)
  Review of Systems  Constitutional: Negative.   HENT: Negative.   Eyes: Negative.   Respiratory: Negative.   Cardiovascular: Negative.   Gastrointestinal: Negative.   Genitourinary: Negative.   Musculoskeletal: Negative.   Skin: Negative.   Neurological: Negative.   Endo/Heme/Allergies: Negative.   Psychiatric/Behavioral: Positive for depression and suicidal ideas.    
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
## 2013-11-18 NOTE — Progress Notes (Signed)
Adult Psychoeducational Group Note  Date:  11/18/2013 Time:  11:18 PM  Group Topic/Focus:  Wrap-Up Group:   The focus of this group is to help patients review their daily goal of treatment and discuss progress on daily workbooks.  Participation Level:  Active  Participation Quality:  Appropriate  Affect:  Appropriate  Cognitive:  Appropriate  Insight: Appropriate  Engagement in Group:  Engaged  Modes of Intervention:  Discussion  Additional Comments:  Pt was present for wrap up group. He got here today and told me before group that he wouldn't have much to share. In group when it was his turn he said "I just got here, and that's all." So, this writer encouraged him to begin brainstorming to himself a goal for tomorrow. He is cooperative and was interacting and joking with his peers this evening.

## 2013-11-18 NOTE — Tx Team (Signed)
Initial Interdisciplinary Treatment Plan  PATIENT STRENGTHS: (choose at least two) Ability for insight Average or above average intelligence General fund of knowledge Supportive family/friends  PATIENT STRESSORS: Financial difficulties   PROBLEM LIST: Problem List/Patient Goals Date to be addressed Date deferred Reason deferred Estimated date of resolution  Depression      Suicidal Ideation/Risk                                                 DISCHARGE CRITERIA:  Ability to meet basic life and health needs Adequate post-discharge living arrangements Improved stabilization in mood, thinking, and/or behavior Medical problems require only outpatient monitoring Motivation to continue treatment in a less acute level of care Need for constant or close observation no longer present Reduction of life-threatening or endangering symptoms to within safe limits Safe-care adequate arrangements made Verbal commitment to aftercare and medication compliance  PRELIMINARY DISCHARGE PLAN: Attend aftercare/continuing care group Return to previous living arrangement  PATIENT/FAMIILY INVOLVEMENT: This treatment plan has been presented to and reviewed with the patient, Curtis Townsend.  The patient and family have been given the opportunity to ask questions and make suggestions.  Wynn Banker 11/18/2013, 6:06 PM

## 2013-11-18 NOTE — Consult Note (Signed)
  Psychiatric Specialty Exam: Physical Exam  ROS  Blood pressure 142/78, pulse 65, temperature 97.7 F (36.5 C), temperature source Oral, resp. rate 18, SpO2 100.00%.There is no weight on file to calculate BMI.  General Appearance: Casual  Eye Contact::  Good  Speech:  Clear and Coherent  Volume:  Normal  Mood:  Depressed  Affect:  Congruent  Thought Process:  Coherent  Orientation:  Full (Time, Place, and Person)  Thought Content:  Negative  Suicidal Thoughts:  No  Homicidal Thoughts:  No  Memory:  Immediate;   Good Recent;   Good Remote;   Good  Judgement:  Intact  Insight:  Fair  Psychomotor Activity:  Normal  Concentration:  Good  Recall:  Good  Akathisia:  Negative  Handed:  Right  AIMS (if indicated):     Assets:  Communication Skills Desire for Improvement Housing Intimacy Leisure Time Physical Health Social Support  Sleep:   adequate  Curtis Townsend says he is not suicidal.  He did take an overdose and stills appears quite depressed, so an inpatient bed will be sought.  He has been accepted to the 500 unit at [facility] and the bed assignment will be made as soon as it is known.  He needs treatment for bipolar depression with a suicidal attempt.

## 2013-11-19 MED ORDER — ENSURE COMPLETE PO LIQD
237.0000 mL | Freq: Three times a day (TID) | ORAL | Status: DC
  Administered 2013-11-19 – 2013-11-24 (×9): 237 mL via ORAL

## 2013-11-19 NOTE — Tx Team (Signed)
Interdisciplinary Treatment Plan Update   Date Reviewed:  11/19/2013  Time Reviewed:  8:38 AM  Progress in Treatment:   Attending groups: Yes Participating in groups: Yes Taking medication as prescribed: Yes  Tolerating medication: Yes Family/Significant other contact made:  No, but will ask patient for consent for collateral contact Patient understands diagnosis: Yes  Discussing patient identified problems/goals with staff: Yes Medical problems stabilized or resolved: Yes Denies suicidal/homicidal ideation: Yes Patient has not harmed self or others: Yes  For review of initial/current patient goals, please see plan of care.  Estimated Length of Stay:  3-5 days  Reasons for Continued Hospitalization:  Anxiety Depression Medication stabilization   New Problems/Goals identified:    Discharge Plan or Barriers:   Home with outpatient follow up to be determined  Additional Comments:  Curtis Townsend is a 38 y.o. male patient will start psychiatric medications and re-evaluated in the am.  Patient was brought to the ED after overdosing on benzodiazepines, vomited shortly after taking them. He admits to an intentional overdose. Poor sleep since January. Curtis Townsend has been unemployed for a year and depression increasing with financial strain. He lives with his wife, mother, and two children (ages 63 and 20). Denies current suicidal/homicidal ideations and hallucinations. Curtis Townsend has not been taking his medications, stated he just got them refilled--goes to [facility]. Diagnosis of bipolar disorder and smokes marijuana, joint daily if he can afford it. Irritable on exam with poor eye contact. He does not want inpatient hospitalization, says it was not helpful in the past--"just need my meds." Curtis Townsend was at [facility] in January and has been in psychiatric hospitals in [place].  Attendees:  Patient:  11/19/2013 8:38 AM   Signature: Mylinda Latina, MD 11/19/2013 8:38 AM  Signature:  Nena Polio, PA 11/19/2013  8:38 AM  Signature:  Eduard Roux, RN 11/19/2013 8:38 AM  Signature:Vivian Nada Boozer, RN 11/19/2013 8:38 AM  Signature:   11/19/2013 8:38 AM  Signature:  Joette Catching, LCSW 11/19/2013 8:38 AM  Signature:  Regan Lemming, LCSW 11/19/2013 8:38 AM  Signature:  Lucinda Dell, Care Coordinator [facility] 11/19/2013 8:38 AM  Signature:   11/19/2013 8:38 AM  Signature:  11/19/2013  8:38 AM  Signature:   Lars Pinks, RN [facility] 11/19/2013  8:38 AM  Signature: 11/19/2013  8:38 AM    Scribe for Treatment Team:   Joette Catching,  11/19/2013 8:38 AM
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
## 2013-11-19 NOTE — Care Management Utilization Note (Signed)
Per State Regulation 482.30  The chart was reviewed for necessity with respect to the patient's Admission/ Duration of stay. Admitted 11/18/13 SI with OD  Next Review Date: 4/53/64  Conception Oms, RN, BSN

## 2013-11-19 NOTE — Progress Notes (Signed)
Pt reports he is a new admit this evening.  He denies SI/HI/AV to this Probation officer.  Conversation was minimal with patient as he really does not want to be here, although he is here voluntarily.  Pt was encouraged to make his needs known to staff.  Pt voices understanding and has no needs or concerns at this time.  Support and encouragement offered.  Safety maintained with q15 minute checks.

## 2013-11-19 NOTE — [facility] Counselor (Signed)
Adult Psychosocial Assessment Update Interdisciplinary Team  Previous [facility] admissions/discharges:  Admissions Discharges  Date:  08/04/14  Date: 08/09/13  Date: Date:  Date: Date:  Date: Date:  Date: Date:   Changes since the last Psychosocial Assessment (including adherence to outpatient mental health and/or substance abuse treatment, situational issues contributing to decompensation and/or relapse). Patient advised of increased depression and a suicide attemt due to financial problems and not getting enough rest.  He also endorses abusing THC daily.              Discharge Plan 1. Will you be returning to the same living situation after discharge?   Yes: No:      If no, what is your plan?    Yes, patient plans to return to his home at discharge.       2. Would you like a referral for services when you are discharged? Yes:     If yes, for what services?  No:       No.  Patient advised he is followed by [facility] for outpatient services.       Summary and Recommendations (to be completed by the evaluator) Curtis Townsend is a 38 years old African American male admitted with Bipolar Disorder.  He will benefit from crisis stabilization, evaluation for medication, psycho-education groups for coping skills development, group therapy and case management for discharge planning.                        Signature:  Eulas Post Ronda Kazmi, 11/19/2013 11:55 AM

## 2013-11-19 NOTE — Progress Notes (Signed)
D: Patient has been in bed most of the day.  Paent states his day was ok.  Patient state he feels he has not learned anything in group but states he has heard it before.  Patient has spent most of the shift in bed.  Patient denies SI/HI and denies AVH A: Staff to monitor Q 15 mins for safety.  Encouragement and support offered.  Scheduled medications administered per orders. R: Patient remains safe on the unit.  Patient attended group tonight.  Patient taking administered mediations.

## 2013-11-19 NOTE — Progress Notes (Signed)
Pt has been in bed much of the day.  He has been up for snacks and meals but not the groups except the outdoor time. He rated his depression and anxiety both a 3 and hopelessness a 4 on his self-inventory. He denied any S /H ideation or A/V/H/  He thinks he will f/u at [facility].
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
## 2013-11-19 NOTE — Progress Notes (Addendum)
NUTRITION ASSESSMENT  Pt identified as at risk on the Malnutrition Screen Tool  INTERVENTION: 1. Educated patient on the importance of nutrition and encouraged intake of food and beverages. 2. Discussed weight goals. 3. Supplements: Ensure Complete po TID, each supplement provides 350 kcal and 13 grams of protein   NUTRITION DIAGNOSIS: Unintentional weight loss related to sub-optimal intake as evidenced by pt report.   Goal: Pt to meet >/= 90% of their estimated nutrition needs.  Monitor:  PO intake  Assessment:  Patient admitted with Bipolar-depression s/p SA.  UBW 195 lbs 1 year ago.  Weight has lost 6 lbs in the last week.  Complaints of poor appetite and early satiety.   Requested Ensure.  38 y.o. male  Height: Ht Readings from Last 1 Encounters:  11/18/13 6\' 1"  (1.854 m)    Weight: Wt Readings from Last 1 Encounters:  11/18/13 163 lb (73.936 kg)    Weight Hx: Wt Readings from Last 10 Encounters:  11/18/13 163 lb (73.936 kg)  11/11/13 169 lb (76.658 kg)  08/04/13 158 lb (71.668 kg)  06/20/13 176 lb 6.4 oz (80.015 kg)  10/16/12 160 lb (72.576 kg)  09/02/12 172 lb 12.8 oz (78.382 kg)  06/27/12 178 lb 6.4 oz (80.922 kg)  06/10/12 183 lb (83.008 kg)  06/10/12 183 lb (83.008 kg)  05/16/12 186 lb (84.369 kg)    BMI:  Body mass index is 21.51 kg/(m^2). Pt meets criteria for normal weight based on current BMI.  Estimated Nutritional Needs: Kcal: 25-30 kcal/kg Protein: > 1 gram protein/kg Fluid: 1 ml/kcal  Diet Order: General Pt is also offered choice of unit snacks mid-morning and mid-afternoon.  Pt is eating as desired.   Lab results and medications reviewed.   Antonieta Iba, RD, LDN Clinical Inpatient Dietitian Pager:  [phone] Weekend and after hours pager:  [phone]

## 2013-11-19 NOTE — Progress Notes (Signed)
Adult Psychoeducational Group Note  Date:  11/19/2013 Time:  11:06 PM  Group Topic/Focus:  Wrap-Up Group:   The focus of this group is to help patients review their daily goal of treatment and discuss progress on daily workbooks.  Participation Level:  Active  Participation Quality:  Appropriate  Affect:  Appropriate  Cognitive:  Appropriate  Insight: Appropriate  Engagement in Group:  Engaged  Modes of Intervention:  Discussion  Additional Comments:The patient expressed that his day was not low nor high but ok.  [name] Dallas Melainie Krinsky 11/19/2013, 11:06 PM

## 2013-11-19 NOTE — [facility] Group Notes (Signed)
[facility] LCSW Aftercare Discharge Planning Group Note   11/19/2013 10:05 AM    Participation Quality:  Appropraite  Mood/Affect:  Appropriate  Depression Rating:  5  Anxiety Rating:  5  Thoughts of Suicide:  No  Will you contract for safety?   NA  Current AVH:  No  Plan for Discharge/Comments:  Patient attended discharge planning group and actively participated in group.  He is followed by [facility].  CSW provided all participants with daily workbook.   Transportation Means: Patient has transportation.   Supports:  Patient has a support system.   Isley Weisheit, Eulas Post

## 2013-11-19 NOTE — [facility] Group Notes (Signed)
[facility] LCSW Group Therapy  Emotional Regulation 1:15 - 2: 30 PM        11/19/2013     Type of Therapy:  Group Therapy  Participation Level:  Minimal  Participation Quality:  Appropriate  Affect:  Depressed  Cognitive:  Appropriate  Insight:  Developing/Improving   Engagement in Therapy:  Developing/Improving   Modes of Intervention:  Discussion Exploration Problem-Solving Supportive  Summary of Progress/Problems:  Group topic was emotional regulations.  Patient remained in group but did not participate in the discussion. Concha Pyo 11/19/2013

## 2013-11-20 DIAGNOSIS — F3130 Bipolar disorder, current episode depressed, mild or moderate severity, unspecified: Secondary | ICD-10-CM | POA: Diagnosis not present

## 2013-11-20 LAB — TSH: TSH: 0.132 u[IU]/mL — ABNORMAL LOW (ref 0.350–4.500)

## 2013-11-20 LAB — T3, FREE: T3, Free: 2.9 pg/mL (ref 2.3–4.2)

## 2013-11-20 NOTE — Progress Notes (Signed)
Attended group 

## 2013-11-20 NOTE — Progress Notes (Signed)
[facility] MD Progress Note  11/20/2013 3:46 PM Curtis Townsend  MRN:  [id number]  Subjective:  Met with patient to discuss his progress in treatment. Patient was admitted with the diagnosis of bipolar disorder depressed mood and suicidal ideation. Patient has been compliant with his medication and actively participated in unit activities including groups. He states that he is better but not a 100% yet. His sleep is good, his appetite is "getting there."  He is attending groups, reports no problems with his medication. He denies SI/HI or AVH.  Diagnosis:   DSM5: Schizophrenia Disorders:   Obsessive-Compulsive Disorders:   Trauma-Stressor Disorders:   Substance/Addictive Disorders:   Depressive Disorders:   Total Time spent with patient: 20 minutes Depressive Disorders:  AXIS I: Bipolar, Depressed  AXIS II: Deferred  AXIS III:  Past Medical History   Diagnosis  Date   .  Bipolar 1 disorder    .  Hyperlipemia      no current med.   .  Seasonal allergies    .  History of kidney stones    .  Wears partial dentures      upper   .  Lipoma of back  05/2012     right   .  Depression    .  History of chicken pox    .  Alcoholism    .  Diabetes    .  Depression    .  Anxiety    .  IBS (irritable bowel syndrome)     AXIS IV: other psychosocial or environmental problems, problems related to social environment and problems with primary support group  AXIS V: 41-50 serious symptoms    ADL's:  Intact  Sleep: Good  Appetite:  Good  Suicidal Ideation:  denies Homicidal Ideation:  denies AEB (as evidenced by):  Psychiatric Specialty Exam: Physical Exam  ROS  Blood pressure 104/72, pulse 96, temperature 97.9 F (36.6 C), temperature source Oral, resp. rate 20, height '6\' 1"'  (1.854 m), weight 73.936 kg (163 lb), SpO2 100.00%.Body mass index is 21.51 kg/(m^2).  General Appearance: Disheveled  Eye Contact::  Poor  Speech:  Clear and Coherent  Volume:  Normal  Mood:  Depressed   Affect:  Congruent  Thought Process:  Goal Directed  Orientation:  Full (Time, Place, and Person)  Thought Content:  WDL  Suicidal Thoughts:  No  Homicidal Thoughts:  No  Memory:  NA  Judgement:  Fair  Insight:  Fair  Psychomotor Activity:  Normal  Concentration:  Fair  Recall:  [place] of Knowledge:Fair  Language: Good  Akathisia:  No  Handed:  Right  AIMS (if indicated):     Assets:  Communication Skills Desire for Improvement  Sleep:  Number of Hours: 6   Musculoskeletal: Strength & Muscle Tone: within normal limits Gait & Station: normal Patient leans: N/A  Current Medications: Current Facility-Administered Medications  Medication Dose Route Frequency Provider Last Rate Last Dose  . acetaminophen (TYLENOL) tablet 650 mg  650 mg Oral Q4H PRN Clarene Reamer, MD   650 mg at 11/19/13 1110  . alum & mag hydroxide-simeth (MAALOX/MYLANTA) 200-200-20 MG/5ML suspension 30 mL  30 mL Oral Q4H PRN Clarene Reamer, MD      . ARIPiprazole (ABILIFY) tablet 5 mg  5 mg Oral BID Clarene Reamer, MD   5 mg at 11/20/13 0901  . divalproex (DEPAKOTE) DR tablet 1,000 mg  1,000 mg Oral QHS Clarene Reamer, MD   1,000 mg  at 11/19/13 2220  . feeding supplement (ENSURE COMPLETE) (ENSURE COMPLETE) liquid 237 mL  237 mL Oral TID BM Darrol Jump, RD   237 mL at 11/20/13 1503  . FLUoxetine (PROZAC) capsule 20 mg  20 mg Oral Daily Clarene Reamer, MD   20 mg at 11/20/13 0902  . magnesium hydroxide (MILK OF MAGNESIA) suspension 30 mL  30 mL Oral Daily PRN Clarene Reamer, MD        Lab Results:  Results for orders placed during the hospital encounter of 11/18/13 (from the past 48 hour(s))  GLUCOSE, CAPILLARY     Status: None   Collection Time    11/18/13  8:49 PM      Result Value Ref Range   Glucose-Capillary 94  70 - 99 mg/dL  TSH     Status: Abnormal   Collection Time    11/20/13  6:15 AM      Result Value Ref Range   TSH 0.132 (*) 0.350 - 4.500 uIU/mL   Comment: Please note change in  reference range.     Performed at [facility]  T3, FREE     Status: None   Collection Time    11/20/13  6:15 AM      Result Value Ref Range   T3, Free 2.9  2.3 - 4.2 pg/mL   Comment: Performed at [facility]    Physical Findings: AIMS: Facial and Oral Movements Muscles of Facial Expression: None, normal Lips and Perioral Area: None, normal Jaw: None, normal Tongue: None, normal,Extremity Movements Upper (arms, wrists, hands, fingers): None, normal Lower (legs, knees, ankles, toes): None, normal, Trunk Movements Neck, shoulders, hips: None, normal, Overall Severity Severity of abnormal movements (highest score from questions above): None, normal Incapacitation due to abnormal movements: None, normal Patient's awareness of abnormal movements (rate only patient's report): No Awareness, Dental Status Current problems with teeth and/or dentures?: No Does patient usually wear dentures?: No  CIWA:  CIWA-Ar Total: 1 COWS:  COWS Total Score: 2  Treatment Plan Summary: Daily contact with patient to assess and evaluate symptoms and progress in treatment Medication management  Plan: 1. Continue crisis management and stabilization. 2. Continue medication management by assessing side effects, dose modification as needed and therapeutic blood levels as indicated. 3. Treat health problems as indicated or consult IM as needed. 4. Continue treatment plan to decrease risk of relapse upon discharge and to reduce the need for readmission to incorporate the use of local resources for support. 5. Psycho-social education regarding relapse prevention through good self care to incorporate diet, exercise, stress reduction, good sleep hygiene, and reduction in external risk factors such as alcohol, tobacco, and substance abuse. 6. Gain consent for contact with family, PCP, or outside health provider as needed. 7. Review home medications as needed. 8. Disposition in progress.   Medical  Decision Making Problem Points:  Established problem, stable/improving (1) Data Points:  Review of medication regiment & side effects (2)  I certify that inpatient services furnished can reasonably be expected to improve the patient's condition.   Marlane Hatcher. [place] RPAC 3:51 PM 11/20/2013  Reviewed the information documented and agree with the treatment plan.  Parke Simmers Melaney Tellefsen 11/20/2013 5:25 PM

## 2013-11-20 NOTE — Progress Notes (Signed)
The focus of this group is to educate the patient on the purpose and policies of crisis stabilization and provide a format to answer questions about their admission.  The group details unit policies and expectations of patients while admitted.  Patient attended 0900 nurse education orientation group this morning.  Patient actively participated, appropriate affect, alert, appropriate insight and engagement.  Today patient will work on 3 goals for discharge.  

## 2013-11-20 NOTE — Progress Notes (Signed)
D:  Patient's self inventory sheet, patient has fair sleep, improving appetite, normal energy level, improving attention span.  Rated depression 6, denied anxiety and hopeless.  Denied withdrawals.  Denied SI.  Denied physical problems.  After discharge plans to return home.  No problems taking meds after discharge. A:  Medications administered per MD orders.  Emotional support and encouragement given patient. R:  Denied SI and HI.  Denied A/V hallucinations.  Will continue to monitor patient for safety with 15 minute checks.

## 2013-11-20 NOTE — Progress Notes (Signed)
D: Took over patient's care @ 2330. Patient in bed sleeping. Respiration regular and unlabored. No sign of distress noted at this time A: 15 mins checks for safety. R: Patient remains asleep. Pt is safe.   

## 2013-11-20 NOTE — BHH Suicide Risk Assessment (Signed)
[place] INPATIENT:  Family/Significant Other Suicide Prevention Education  Suicide Prevention Education:  Education Completed; Michoel Kunin, Wife - [phone]; has been identified by the patient as the family member/significant other with whom the patient will be residing, and identified as the person(s) who will aid the patient in the event of a mental health crisis (suicidal ideations/suicide attempt).  With written consent from the patient, the family member/significant other has been provided the following suicide prevention education, prior to the and/or following the discharge of the patient.  The suicide prevention education provided includes the following:  Suicide risk factors  Suicide prevention and interventions  National Suicide Hotline telephone number  [facility] assessment telephone number  [facility] Emergency Assistance [place] and/or Residential Mobile Crisis Unit telephone number  Request made of family/significant other to:  Remove weapons (e.g., guns, rifles, knives), all items previously/currently identified as safety concern. Wife advised patient does not have access to weapons.    Remove drugs/medications (over-the-counter, prescriptions, illicit drugs), all items previously/currently identified as a safety concern.  The family member/significant other verbalizes understanding of the suicide prevention education information provided.  The family member/significant other agrees to remove the items of safety concern listed above.  Rubbie Goostree Hairston Rubin Dais 11/20/2013, 1:30 PM

## 2013-11-20 NOTE — Progress Notes (Signed)
Patient ID: Curtis Townsend, male   DOB: 1975-07-18, 38 y.o.   MRN: [id number] D: Patient in dayroom on approach. Pt mood and affect appeared depressed and flat. Pt report feeling of anxiety. Pt observed in dayroom interacting with peers. Pt denies SI/HI/AVH and pain. Pt attended evening wrap up group and engage in discussion. Pt denies any needs or concerns.  Cooperative with assessment. No acute distressed noted at this time.   A: Met with pt 1:1. Medications administered as prescribed. Writer encouraged pt to discuss feelings. Pt encouraged to come to staff with any question or concerns.   R: Patient remains safe. He is complaint with medications and denies any adverse reaction. Continue current POC.

## 2013-11-20 NOTE — [facility]HH Group Notes (Signed)
[place] LCSW Group Therapy  Living A Balanced Life  1:15 - 2: 30          11/20/2013    Type of Therapy:  Group Therapy  Participation Level:  Appropriate  Participation Quality:  Appropriate  Affect:  Appropriate  Cognitive:  Attentive Appropriate  Insight: Developing/Improving  Engagement in Therapy:  Developing/Improving  Modes of Intervention:  Discussion Exploration Problem-Solving Supportive   Summary of Progress/Problems: Topic for group was Living a Balanced Life.  Patient was able to show how life has become unbalanced.  Patient shared he spends more time doing things for himself.  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   Patient able to  Identify appropriate coping skills.   Concha Pyo 11/20/2013

## 2013-11-21 DIAGNOSIS — F3130 Bipolar disorder, current episode depressed, mild or moderate severity, unspecified: Secondary | ICD-10-CM | POA: Diagnosis not present

## 2013-11-21 MED ORDER — FLUOXETINE HCL 20 MG PO CAPS
40.0000 mg | ORAL_CAPSULE | Freq: Every day | ORAL | Status: DC
  Administered 2013-11-22 – 2013-11-24 (×3): 40 mg via ORAL
  Filled 2013-11-21 (×4): qty 2
  Filled 2013-11-21: qty 6

## 2013-11-21 NOTE — Progress Notes (Signed)
Adult Psychoeducational Group Note  Date:  11/21/2013 Time:  8:44 PM  Group Topic/Focus:  Wrap-Up Group:   The focus of this group is to help patients review their daily goal of treatment and discuss progress on daily workbooks.  Participation Level:  Minimal  Participation Quality:  Attentive  Affect:  Flat  Cognitive:  Alert  Insight: Lacking  Engagement in Group:  Lacking  Modes of Intervention:  Discussion, Exploration, Socialization and Support  Additional Comments:  Pt came to group towards the end and shared that he had a good visit with his wife and had no negative concerns for the day.  Rogene Houston 11/21/2013, 8:44 PM

## 2013-11-21 NOTE — Tx Team (Signed)
Interdisciplinary Treatment Plan Update (Adult)  Date: 11/21/2013  Time Reviewed:  9:45 AM  Progress in Treatment: Attending groups: Yes Participating in groups:  Yes Taking medication as prescribed:  Yes Tolerating medication:  Yes Family/Significant othe contact made: Yes, with Curtis Townsend's wife Patient understands diagnosis:  Yes Discussing patient identified problems/goals with staff:  Yes Medical problems stabilized or resolved:  Yes Denies suicidal/homicidal ideation: Yes Issues/concerns per patient self-inventory:  Yes Other:  New problem(s) identified: N/A  Discharge Plan or Barriers: Curtis Townsend has follow up scheduled at [facility] for outpatient medication management and therapy.   Reason for Continuation of Hospitalization: Anxiety Depression Medication Stabilization  Comments: N/A  Estimated length of stay: 3-4 days  For review of initial/current patient goals, please see plan of care.  Attendees: Patient:     Family:     Physician:  Dr. Zorita Pang 11/21/2013 9:57 AM   Nursing:   Drake Leach, RN 11/21/2013 9:57 AM   Clinical Social Worker:  Regan Lemming, LCSW 11/21/2013 9:57 AM   Other: Grayland Ormond, RN 11/21/2013 9:57 AM   Other:  Salvatore Marvel, RN 11/21/2013 9:57 AM   Other:  Lars Pinks, case manager 11/21/2013 9:57 AM   Other:  Norberto Sorenson, care coordination 11/21/2013 9:58 AM   Other:    Other:    Other:    Other:    Other:    Other:     Scribe for Treatment Team:   Ane Payment, 11/21/2013 9:57 AM

## 2013-11-21 NOTE — Progress Notes (Signed)
Patient ID: Curtis Townsend, male   DOB: [birth year], 38 y.o.   MRN: [id number] [facility] MD Progress Note  11/21/2013 11:41 AM Curtis Townsend  MRN:  [id number]  Subjective:  Patient was seen and chart reviewed and case discussed with the treatment team and also to discuss his progress in treatment. Patient was admitted with the diagnosis of bipolar disorder depressed mood and suicidal ideation/suicide and with muscle relaxants medication. Patient continued to report symptoms of depression, anxiety and passive suicide ideation at this time. Patient reported his depression 8/10, anxiety 9/10, hopelessness 8/10, contract for safety while in the hospital. Patient has been compliant with his medication and actively participated in unit activities including groups. Patient has a disturbance of sleep because of his roommate who has been keep waking up in the middle of the night and his appetite is "getting there."    Diagnosis:   DSM5: Schizophrenia Disorders:   Obsessive-Compulsive Disorders:   Trauma-Stressor Disorders:   Substance/Addictive Disorders:   Depressive Disorders:   Total Time spent with patient: 20 minutes Depressive Disorders:  AXIS I: Bipolar, Depressed  AXIS II: Deferred  AXIS III:  Past Medical History   Diagnosis  Date   .  Bipolar 1 disorder    .  Hyperlipemia      no current med.   .  Seasonal allergies    .  History of kidney stones    .  Wears partial dentures      upper   .  Lipoma of back  05/2012     right   .  Depression    .  History of chicken pox    .  Alcoholism    .  Diabetes    .  Depression    .  Anxiety    .  IBS (irritable bowel syndrome)     AXIS IV: other psychosocial or environmental problems, problems related to social environment and problems with primary support group  AXIS V: 41-50 serious symptoms    ADL's:  Intact  Sleep: Good  Appetite:  Good  Suicidal Ideation:  denies Homicidal Ideation:  denies AEB (as evidenced  by):  Psychiatric Specialty Exam: Physical Exam  ROS  Blood pressure 111/73, pulse 87, temperature 97.9 F (36.6 C), temperature source Oral, resp. rate 16, height 6\' 1"  (1.854 m), weight 73.936 kg (163 lb), SpO2 100.00%.Body mass index is 21.51 kg/(m^2).  General Appearance: Disheveled  Eye Contact::  Poor  Speech:  Clear and Coherent  Volume:  Normal  Mood:  Depressed  Affect:  Congruent  Thought Process:  Goal Directed  Orientation:  Full (Time, Place, and Person)  Thought Content:  WDL  Suicidal Thoughts:  No  Homicidal Thoughts:  No  Memory:  NA  Judgement:  Fair  Insight:  Fair  Psychomotor Activity:  Normal  Concentration:  Fair  Recall:  Roe of Knowledge:Fair  Language: Good  Akathisia:  No  Handed:  Right  AIMS (if indicated):     Assets:  Communication Skills Desire for Improvement  Sleep:  Number of Hours: 6.75   Musculoskeletal: Strength & Muscle Tone: within normal limits Gait & Station: normal Patient leans: N/A  Current Medications: Current Facility-Administered Medications  Medication Dose Route Frequency Provider Last Rate Last Dose  . acetaminophen (TYLENOL) tablet 650 mg  650 mg Oral Q4H PRN Clarene Reamer, MD   650 mg at 11/19/13 1110  . alum & mag hydroxide-simeth (MAALOX/MYLANTA) 200-200-20 MG/5ML suspension 30 mL  30 mL Oral Q4H PRN Clarene Reamer, MD      . ARIPiprazole (ABILIFY) tablet 5 mg  5 mg Oral BID Clarene Reamer, MD   5 mg at 11/21/13 6213  . divalproex (DEPAKOTE) DR tablet 1,000 mg  1,000 mg Oral QHS Clarene Reamer, MD   1,000 mg at 11/20/13 2130  . feeding supplement (ENSURE COMPLETE) (ENSURE COMPLETE) liquid 237 mL  237 mL Oral TID BM Darrol Jump, RD   237 mL at 11/21/13 0932  . FLUoxetine (PROZAC) capsule 20 mg  20 mg Oral Daily Clarene Reamer, MD   20 mg at 11/21/13 0865  . magnesium hydroxide (MILK OF MAGNESIA) suspension 30 mL  30 mL Oral Daily PRN Clarene Reamer, MD        Lab Results:  Results for orders  placed during the hospital encounter of 11/18/13 (from the past 48 hour(s))  TSH     Status: Abnormal   Collection Time    11/20/13  6:15 AM      Result Value Ref Range   TSH 0.132 (*) 0.350 - 4.500 uIU/mL   Comment: Please note change in reference range.     Performed at [facility]  T3, FREE     Status: None   Collection Time    11/20/13  6:15 AM      Result Value Ref Range   T3, Free 2.9  2.3 - 4.2 pg/mL   Comment: Performed at [facility]    Physical Findings: AIMS: Facial and Oral Movements Muscles of Facial Expression: None, normal Lips and Perioral Area: None, normal Jaw: None, normal Tongue: None, normal,Extremity Movements Upper (arms, wrists, hands, fingers): None, normal Lower (legs, knees, ankles, toes): None, normal, Trunk Movements Neck, shoulders, hips: None, normal, Overall Severity Severity of abnormal movements (highest score from questions above): None, normal Incapacitation due to abnormal movements: None, normal Patient's awareness of abnormal movements (rate only patient's report): No Awareness, Dental Status Current problems with teeth and/or dentures?: No Does patient usually wear dentures?: No  CIWA:  CIWA-Ar Total: 0 COWS:  COWS Total Score: 1  Treatment Plan Summary: Daily contact with patient to assess and evaluate symptoms and progress in treatment Medication management  Plan: 1. Continue crisis management and stabilization. 2. Continue medication management by assessing side effects, dose modification as needed and therapeutic blood levels as indicated. 3. Treat health problems as indicated or consult IM as needed. 4. Continue treatment plan to decrease risk of relapse upon discharge and to reduce the need for readmission to incorporate the use of local resources for support. 5. Psycho-social education regarding relapse prevention through good self care to incorporate diet, exercise, stress reduction, good sleep hygiene, and  reduction in external risk factors such as alcohol, tobacco, and substance abuse. 6. Gain consent for contact with family, PCP, or outside health provider as needed. 7. Review home medications as needed. 8. Disposition in progress may be because the Monday when he continued to show clinical improvement and contracts for safety.   Medical Decision Making Problem Points:  Established problem, stable/improving (1) Data Points:  Review of medication regiment & side effects (2)  I certify that inpatient services furnished can reasonably be expected to improve the patient's condition.   Parke Simmers Caralina Nop 11/21/2013 11:41 AM

## 2013-11-21 NOTE — Progress Notes (Addendum)
D:  Patient's self inventory sheet, patient has fair sleep, improving appetite, normal energy level, improving attention span.  Rated depression and hopeless 8, denied anxiety.  Denied withdrawals.  Denied SI.  Denied physical problems.  Plans to return home.  Has insurance for his medications. A:  Medications administered per MD orders.  Emotional support and encouragement given patient. R:  Denied SI and HI.  Denied A/V hallucinations.  Contracts for safety.  Will continue to monitor patient for safety with 15 minute checks.  Safety maintained.

## 2013-11-21 NOTE — BHH Group Notes (Addendum)
[place] LCSW Group Therapy  11/21/2013  1:15 PM   Type of Therapy:  Group Therapy  Participation Level:  Minimal  Participation Quality:  Minimal  Affect:  Depressed, Flat, Drowsy  Cognitive:  Drowsy  Insight:  Developing/Improving and Engaged  Engagement in Therapy:  Developing/Improving and Engaged  Modes of Intervention:  Clarification, Confrontation, Discussion, Education, Exploration, Limit-setting, Orientation, Problem-solving, Rapport Building, Art therapist, Socialization and Support  Summary of Progress/Problems: The topic for today was feelings about relapse.  Pt discussed what relapse prevention is to them and identified triggers that they are on the path to relapse.  Pt processed their feeling towards relapse and was able to relate to peers.  Pt discussed coping skills that can be used for relapse prevention.  Pt shared that he has relapsed a few times because he stops his routine of taking his meds and becomes suicidal.  Pt states that he plans to pray and stay on his meds to prevent future relapses.  Pt actively participated and was engaged in group discussion.    Regan Lemming, LCSW 11/21/2013  2:28 PM

## 2013-11-21 NOTE — BHH Group Notes (Signed)
[facility] LCSW Aftercare Discharge Planning Group Note   11/21/2013 8:45 AM  Participation Quality:  Alert, Appropriate and Oriented  Mood/Affect:  Flat and Depressed  Depression Rating:  1  Anxiety Rating:  1  Thoughts of Suicide:  Pt denies SI/HI  Will you contract for safety?   Yes  Current AVH:  Pt denies  Plan for Discharge/Comments:  Pt attended discharge planning group and actively participated in group.  CSW provided pt with today's workbook.  Pt reports feeling tired today.  Pt will return home in [place] with family and has follow up scheduled at [facility] for outpatient medication management and therapy.  No further needs voiced by pt at this time.    Transportation Means: Pt reports access to transportation - wife will pick pt up at d/c  Supports: Family is supportive  Regan Lemming, LCSW 11/21/2013 10:25 AM

## 2013-11-21 NOTE — Progress Notes (Signed)
Patient ID: Curtis Townsend, male   DOB: 1975-12-12, 38 y.o.   MRN: [id number] PER STATE REGULATIONS 482.30  THIS CHART WAS REVIEWED FOR MEDICAL NECESSITY WITH RESPECT TO THE PATIENT'S ADMISSION/ DURATION OF STAY.  NEXT REVIEW DATE: 11/25/2013  Chauncy Lean, RN, BSN CASE MANAGER

## 2013-11-21 NOTE — Progress Notes (Signed)
Patient ID: Curtis Townsend, male   DOB: 1975-10-15, 38 y.o.   MRN: [id number] D: Patient in dayroom on approach. Pt mood and affect appeared depressed and flat. Pt has been isolative most of the evening and interacting less with peers. Pt is guarded and  interaction with writer is also minimal. Pt denies SI/HI/AVH and pain. Pt attended evening wrap up group and engage in discussion. Pt denies any needs or concerns.  Cooperative with assessment. No acute distressed noted at this time.   A: Met with pt 1:1. Medications administered as prescribed. Writer encouraged pt to discuss feelings. Pt encouraged to come to staff with any question or concerns.   R: Patient remains safe. He is complaint with medications and denies any adverse reaction. Continue current POC.

## 2013-11-22 DIAGNOSIS — F3130 Bipolar disorder, current episode depressed, mild or moderate severity, unspecified: Principal | ICD-10-CM

## 2013-11-22 LAB — COMPREHENSIVE METABOLIC PANEL
ALT: 12 U/L (ref 0–53)
AST: 12 U/L (ref 0–37)
Albumin: 3.8 g/dL (ref 3.5–5.2)
Alkaline Phosphatase: 80 U/L (ref 39–117)
BUN: 10 mg/dL (ref 6–23)
CALCIUM: 10 mg/dL (ref 8.4–10.5)
CO2: 32 meq/L (ref 19–32)
Chloride: 101 mEq/L (ref 96–112)
Creatinine, Ser: 1.14 mg/dL (ref 0.50–1.35)
GFR, EST NON AFRICAN AMERICAN: 81 mL/min — AB (ref >90)
GLUCOSE: 82 mg/dL (ref 70–99)
Potassium: 4.2 mEq/L (ref 3.7–5.3)
SODIUM: 144 meq/L (ref 137–147)
Total Bilirubin: 0.5 mg/dL (ref 0.3–1.2)
Total Protein: 7.1 g/dL (ref 6.0–8.3)

## 2013-11-22 LAB — CBC WITH DIFFERENTIAL/PLATELET
Basophils Absolute: 0 10*3/uL (ref 0.0–0.1)
Basophils Relative: 0 % (ref 0–1)
EOS PCT: 2 % (ref 0–5)
Eosinophils Absolute: 0.1 10*3/uL (ref 0.0–0.7)
HEMATOCRIT: 47.1 % (ref 39.0–52.0)
Hemoglobin: 16.3 g/dL (ref 13.0–17.0)
LYMPHS ABS: 2.6 10*3/uL (ref 0.7–4.0)
Lymphocytes Relative: 48 % — ABNORMAL HIGH (ref 12–46)
MCH: 31.6 pg (ref 26.0–34.0)
MCHC: 34.6 g/dL (ref 30.0–36.0)
MCV: 91.3 fL (ref 78.0–100.0)
MONO ABS: 0.5 10*3/uL (ref 0.1–1.0)
MONOS PCT: 9 % (ref 3–12)
Neutro Abs: 2.2 10*3/uL (ref 1.7–7.7)
Neutrophils Relative %: 41 % — ABNORMAL LOW (ref 43–77)
PLATELETS: 157 10*3/uL (ref 150–400)
RBC: 5.16 MIL/uL (ref 4.22–5.81)
RDW: 12.1 % (ref 11.5–15.5)
WBC: 5.4 10*3/uL (ref 4.0–10.5)

## 2013-11-22 LAB — VALPROIC ACID LEVEL: Valproic Acid Lvl: 63.9 ug/mL (ref 50.0–100.0)

## 2013-11-22 NOTE — Progress Notes (Signed)
D) Pt has been in his room most of the shift today. Rates his depression and hopelessness both at a 2 and denies SI and HI. In a 1:1 Pt states that he was the 'black sheep in the family" always being left alone and for the most part "raising myself". States "I like to be alone and I have two kids at home. This is a rest for me". A) Pt gently confronted about the fact that he has done the same thing over and over for many years. He now has an opportunity to change the way he does things and he is continuing to do things the same way by withdrawing and staying alone.  R) Pt was able to hear that and decided to go outside with the group.

## 2013-11-22 NOTE — Progress Notes (Signed)
.  Psychoeducational Group Note    Date: 11/22/2013 Time:  0930  Goal Setting Purpose of Group: To be able to set a goal that is measurable and that can be accomplished in one day Participation Level:  Active  Participation Quality:  Appropriate  Affect:  Appropriate  Cognitive:  Oriented  Insight:  Improving  Engagement in Group:  Engaged  Additional Comments:  Pt was attentive and partisipated  Curtis Townsend

## 2013-11-22 NOTE — BHH Group Notes (Signed)
[place] LCSW Group Therapy Note  11/22/2013 1:15 PM  Type of Therapy and Topic:  Group Therapy: Avoiding Self-Sabotaging and Enabling Behaviors  Participation Level:  Did not attend  Sheilah Pigeon, LCSW

## 2013-11-22 NOTE — Progress Notes (Signed)
Psychoeducational Group Note  Date: 11/22/2013 Time:  1015  Group Topic/Focus:  Identifying Needs:   The focus of this group is to help patients identify their personal needs that have been historically problematic and identify healthy behaviors to address their needs.  Participation Level:  Did not attend  Paulino Rily

## 2013-11-22 NOTE — Progress Notes (Signed)
The focus of this group is to help patients review their daily goal of treatment and discuss progress on daily workbooks. Pt attended the evening group session and responded to all discussion prompts from the [place]. Pt shared that today was a good day on the unit, the highlight of which was a visit from his two young children. "They're 38yo and 6yo and it really made my day seeing them." Pt's only additional request from Nursing Staff this evening was to do laundry, which was taken care of following group. Pt's affect was appropriate.

## 2013-11-22 NOTE — Progress Notes (Signed)
Patient ID: Curtis Townsend, male   DOB: 30-Mar-1976, 38 y.o.   MRN: [id number] [facility] MD Progress Note  11/22/2013 12:08 PM Curtis Townsend  MRN:  [id number]  Subjective:   Patient states "I came for depression. I think it is getting better. I think the medication makes me a little tired. I am worried about my financial problems. I have a new job starting in June though. Part of the problem was I was not taking my medications at home. My appetite is better today."   Objective:  Patient is visible on the unit and attending the scheduled groups. He continues to appear flat and depressed. His interaction with staff have been guarded and the patient forwards little information. Patient was hesitant to talk about his stressors with this Provider. He admits to a recent suicide attempt in January of 2015. Patient was also admitted currently after benzodiazepine overdose. The patient shows no emotion when talking about an upcoming job in June. He has been noncompliant with medications but has been smoking marijuana when he could afford it. Patient rates his depression at two but this is incongruent with his affect. He is compliant with his medications and denies any adverse effects.   Diagnosis:   DSM5: Schizophrenia Disorders:   Obsessive-Compulsive Disorders:   Trauma-Stressor Disorders:   Substance/Addictive Disorders:   Depressive Disorders:   Total Time spent with patient: 20 minutes Depressive Disorders:  AXIS I: Bipolar, Depressed  AXIS II: Deferred  AXIS III:  Past Medical History   Diagnosis  Date   .  Bipolar 1 disorder    .  Hyperlipemia      no current med.   .  Seasonal allergies    .  History of kidney stones    .  Wears partial dentures      upper   .  Lipoma of back  05/2012     right   .  Depression    .  History of chicken pox    .  Alcoholism    .  Diabetes    .  Depression    .  Anxiety    .  IBS (irritable bowel syndrome)     AXIS IV: other psychosocial or  environmental problems, problems related to social environment and problems with primary support group  AXIS V: 41-50 serious symptoms  ADL's:  Intact  Sleep: Good  Appetite:  Good  Suicidal Ideation:  denies Homicidal Ideation:  denies AEB (as evidenced by):  Psychiatric Specialty Exam: Physical Exam  Review of Systems  Constitutional: Negative.   HENT: Negative.   Eyes: Negative.   Respiratory: Negative.   Cardiovascular: Negative.   Gastrointestinal: Negative.   Genitourinary: Negative.   Musculoskeletal: Negative.   Skin: Negative.   Neurological: Negative.   Endo/Heme/Allergies: Negative.   Psychiatric/Behavioral: Positive for depression, suicidal ideas and substance abuse (UDS positive for marijuana ). Negative for hallucinations and memory loss. The patient is nervous/anxious and has insomnia.     Blood pressure 112/76, pulse 90, temperature 97.9 F (36.6 C), temperature source Oral, resp. rate 16, height 6\' 1"  (1.854 m), weight 73.936 kg (163 lb), SpO2 100.00%.Body mass index is 21.51 kg/(m^2).  General Appearance: Casual  Eye Contact::  Minimal  Speech:  Clear and Coherent  Volume:  Normal  Mood:  Dysphoric  Affect:  Flat  Thought Process:  Goal Directed  Orientation:  Full (Time, Place, and Person)  Thought Content:  WDL  Suicidal Thoughts:  No  Homicidal  Thoughts:  No  Memory:  NA  Judgement:  Fair  Insight:  Fair  Psychomotor Activity:  Normal  Concentration:  Fair  Recall:  [place] of Knowledge:Fair  Language: Good  Akathisia:  No  Handed:  Right  AIMS (if indicated):     Assets:  Communication Skills Desire for Improvement Physical Health Resilience  Sleep:  Number of Hours: 6.75   Musculoskeletal: Strength & Muscle Tone: within normal limits Gait & Station: normal Patient leans: N/A  Current Medications: Current Facility-Administered Medications  Medication Dose Route Frequency Provider Last Rate Last Dose  . acetaminophen  (TYLENOL) tablet 650 mg  650 mg Oral Q4H PRN Clarene Reamer, MD   650 mg at 11/19/13 1110  . alum & mag hydroxide-simeth (MAALOX/MYLANTA) 200-200-20 MG/5ML suspension 30 mL  30 mL Oral Q4H PRN Clarene Reamer, MD      . ARIPiprazole (ABILIFY) tablet 5 mg  5 mg Oral BID Clarene Reamer, MD   5 mg at 11/22/13 0836  . divalproex (DEPAKOTE) DR tablet 1,000 mg  1,000 mg Oral QHS Clarene Reamer, MD   1,000 mg at 11/21/13 2149  . feeding supplement (ENSURE COMPLETE) (ENSURE COMPLETE) liquid 237 mL  237 mL Oral TID BM Darrol Jump, RD   237 mL at 11/21/13 0932  . FLUoxetine (PROZAC) capsule 40 mg  40 mg Oral Daily Durward Parcel, MD   40 mg at 11/22/13 0836  . magnesium hydroxide (MILK OF MAGNESIA) suspension 30 mL  30 mL Oral Daily PRN Clarene Reamer, MD        Lab Results:  No results found for this or any previous visit (from the past 25 hour(s)).  Physical Findings: AIMS: Facial and Oral Movements Muscles of Facial Expression: None, normal Lips and Perioral Area: None, normal Jaw: None, normal Tongue: None, normal,Extremity Movements Upper (arms, wrists, hands, fingers): None, normal Lower (legs, knees, ankles, toes): None, normal, Trunk Movements Neck, shoulders, hips: None, normal, Overall Severity Severity of abnormal movements (highest score from questions above): None, normal Incapacitation due to abnormal movements: None, normal Patient's awareness of abnormal movements (rate only patient's report): No Awareness, Dental Status Current problems with teeth and/or dentures?: No Does patient usually wear dentures?: No  CIWA:  CIWA-Ar Total: 0 COWS:  COWS Total Score: 1  Treatment Plan Summary: Daily contact with patient to assess and evaluate symptoms and progress in treatment Medication management  Plan: 1. Continue crisis management and stabilization. 2. Continue medication management by assessing side effects, dose modification as needed and therapeutic blood  levels as indicated. Continue Abilify 5 mg BID for improved mood stability, Prozac 40 mg daily for depression, Depakote 1,000 mg hs for mood lability.  3. Treat health problems as indicated or consult IM as needed. 4. Continue treatment plan to decrease risk of relapse upon discharge and to reduce the need for readmission to incorporate the use of local resources for support. 5. Psycho-social education regarding relapse prevention through good self care to incorporate diet, exercise, stress reduction, good sleep hygiene, and reduction in external risk factors such as alcohol, tobacco, and substance abuse. 6. Gain consent for contact with family, PCP, or outside health provider as needed. 7. Review home medications as needed. 8. Disposition in progress may be because the Monday when he continued to show clinical improvement and contracts for safety.  Medical Decision Making Problem Points:  Established problem, stable/improving (1), Review of last therapy session (1) and Review of psycho-social  stressors (1) Data Points:  Review or order clinical lab tests (1) Review and summation of old records (2) Review of medication regiment & side effects (2)  I certify that inpatient services furnished can reasonably be expected to improve the patient's condition.   Elmarie Shiley NP-C 11/22/2013 12:08 PM  I agreed with the findings, treatment and disposition plan of this patient. Berniece Andreas, MD

## 2013-11-23 DIAGNOSIS — F3130 Bipolar disorder, current episode depressed, mild or moderate severity, unspecified: Secondary | ICD-10-CM | POA: Diagnosis not present

## 2013-11-23 NOTE — [facility] Group Notes (Signed)
[place] LCSW Group Therapy Note   11/23/2013  1:15 to 2:05 PM  Type of Therapy and Topic: Group Therapy: Feelings Around Returning Home & Establishing a Supportive Framework and Activity to Identify signs of Improvement or Decompensation   Participation Level: Did Not Attend  Sheilah Pigeon, LCSW

## 2013-11-23 NOTE — Progress Notes (Signed)
Psychoeducational Group Note  Date:  11/23/2013 Time:  1015  Group Topic/Focus:  Making Healthy Choices:   The focus of this group is to help patients identify negative/unhealthy choices they were using prior to admission and identify positive/healthier coping strategies to replace them upon discharge.  Participation Level:  Active  Participation Quality:  Appropriate  Affect:  Appropriate  Cognitive:  Oriented  Insight:  Improving  Engagement in Group:  Engaged  Additional Comments:  Pt engaged in the group   Curtis Townsend A 11/23/2013 

## 2013-11-23 NOTE — Progress Notes (Signed)
Psychoeducational Group Note  Date: 11/23/2013 Time  0930  Group Topic/Focus:  Gratefulness:  The focus of this group is to help patients identify what two things they are most grateful for in their lives. What helps ground them and to center them on their work to their recovery.  Participation Level:  Active  Participation Quality:  Appropriate  Affect:  Appropriate  Cognitive:  Oriented  Insight:  Improving  Engagement in Group:  Improving  Additional Comments:  Pt participated in the group.  Jaana Brodt A   

## 2013-11-23 NOTE — Progress Notes (Signed)
D:  Patient's self inventory sheet, patient sleeps fair, improving appetite, normal energy level, improving attention span.  Rated depression, hopeless and anxiety #2.  Denied withdrawals.  Denied SI.  Denied physical problems.  Worst pain in past 24 hours #1.  Plans to discharge home.  No problems with medications after discharge. A:  Medications administered per MD orders.  Emotional support and encouragement given patient. R:  Denied SI and HI.  Denied A/V hallucinations.  Will continue to monitor patient with 15 minute checks.  Safety maintained.

## 2013-11-23 NOTE — Progress Notes (Signed)
Patient ID: Curtis Townsend, male   DOB: September 03, 1975, 38 y.o.   MRN: [id number] [facility] MD Progress Note  11/23/2013 4:03 PM Curtis Townsend  MRN:  [id number]  Subjective:   Patient states "I came for depression and I am feeling better. I no longer want to kill myself. I think the medication makes me a little tired, but I am stable. I would be doing much better if I could get away from this group of clowns." He continues to endorse trouble sleeping, due to his roommate being so restless at night.    Objective:  Patient is visible on the unit and attending the scheduled groups. He continues to appear flat and depressed. The patient shows no emotion when talking about an upcoming job in June. He appears to have a flat affect but states he has never been a sociable person. Patient rates his depression at 2/10 but this is incongruent with his affect. He rates his anxiety at 0/10 at this time.  He is compliant with his medications and denies any adverse effects.   Diagnosis:   DSM5: Schizophrenia Disorders:   Obsessive-Compulsive Disorders:   Trauma-Stressor Disorders:   Substance/Addictive Disorders:   Depressive Disorders:  Bipolar, Depressive Disorder Total Time spent with patient: 20 minutes Depressive Disorders:  AXIS I: Bipolar, Depressed  AXIS II: Deferred  AXIS III:  Past Medical History   Diagnosis  Date   .  Bipolar 1 disorder    .  Hyperlipemia      no current med.   .  Seasonal allergies    .  History of kidney stones    .  Wears partial dentures      upper   .  Lipoma of back  05/2012     right   .  Depression    .  History of chicken pox    .  Alcoholism    .  Diabetes    .  Depression    .  Anxiety    .  IBS (irritable bowel syndrome)     AXIS IV: other psychosocial or environmental problems, problems related to social environment and problems with primary support group  AXIS V: 41-50 serious symptoms  ADL's:  Intact  Sleep: Good  Appetite:  Good  Suicidal  Ideation:  denies Homicidal Ideation:  denies AEB (as evidenced by):  Psychiatric Specialty Exam: Physical Exam  Constitutional: He is oriented to person, place, and time. He appears well-developed.  HENT:  Head: Normocephalic.  Neck: Normal range of motion.  GI: Soft.  Musculoskeletal: Normal range of motion.  Neurological: He is alert and oriented to person, place, and time.  Skin: Skin is warm and dry.    Review of Systems  Constitutional: Negative.   HENT: Negative.   Eyes: Negative.   Respiratory: Negative.   Cardiovascular: Negative.   Gastrointestinal: Negative.   Genitourinary: Negative.   Musculoskeletal: Negative.   Skin: Negative.   Neurological: Negative.   Endo/Heme/Allergies: Negative.   Psychiatric/Behavioral: Positive for depression, suicidal ideas and substance abuse (UDS positive for marijuana ). Negative for hallucinations and memory loss. The patient is nervous/anxious and has insomnia.   All other systems reviewed and are negative.   Blood pressure 120/83, pulse 88, temperature 97.7 F (36.5 C), temperature source Oral, resp. rate 16, height '6\' 1"'  (1.854 m), weight 73.936 kg (163 lb), SpO2 100.00%.Body mass index is 21.51 kg/(m^2).  General Appearance: Casual  Eye Contact::  Minimal  Speech:  Clear and Coherent  Volume:  Normal  Mood:  Dysphoric  Affect:  Flat  Thought Process:  Goal Directed  Orientation:  Full (Time, Place, and Person)  Thought Content:  WDL  Suicidal Thoughts:  No  Homicidal Thoughts:  No  Memory:  NA  Judgement:  Fair  Insight:  Fair  Psychomotor Activity:  Normal  Concentration:  Fair  Recall:  [place] of Knowledge:Fair  Language: Good  Akathisia:  No  Handed:  Right  AIMS (if indicated):     Assets:  Communication Skills Desire for Improvement Physical Health Resilience  Sleep:  Number of Hours: 5.75   Musculoskeletal: Strength & Muscle Tone: within normal limits Gait & Station: normal Patient leans:  N/A  Current Medications: Current Facility-Administered Medications  Medication Dose Route Frequency Provider Last Rate Last Dose  . acetaminophen (TYLENOL) tablet 650 mg  650 mg Oral Q4H PRN Clarene Reamer, MD   650 mg at 11/19/13 1110  . alum & mag hydroxide-simeth (MAALOX/MYLANTA) 200-200-20 MG/5ML suspension 30 mL  30 mL Oral Q4H PRN Clarene Reamer, MD      . ARIPiprazole (ABILIFY) tablet 5 mg  5 mg Oral BID Clarene Reamer, MD   5 mg at 11/23/13 0810  . divalproex (DEPAKOTE) DR tablet 1,000 mg  1,000 mg Oral QHS Clarene Reamer, MD   1,000 mg at 11/22/13 2214  . feeding supplement (ENSURE COMPLETE) (ENSURE COMPLETE) liquid 237 mL  237 mL Oral TID BM Darrol Jump, RD   237 mL at 11/23/13 1016  . FLUoxetine (PROZAC) capsule 40 mg  40 mg Oral Daily Durward Parcel, MD   40 mg at 11/23/13 0811  . magnesium hydroxide (MILK OF MAGNESIA) suspension 30 mL  30 mL Oral Daily PRN Clarene Reamer, MD        Lab Results:  Results for orders placed during the hospital encounter of 11/18/13 (from the past 48 hour(s))  CBC WITH DIFFERENTIAL     Status: Abnormal   Collection Time    11/22/13  7:37 PM      Result Value Ref Range   WBC 5.4  4.0 - 10.5 K/uL   RBC 5.16  4.22 - 5.81 MIL/uL   Hemoglobin 16.3  13.0 - 17.0 g/dL   HCT 47.1  39.0 - 52.0 %   MCV 91.3  78.0 - 100.0 fL   MCH 31.6  26.0 - 34.0 pg   MCHC 34.6  30.0 - 36.0 g/dL   RDW 12.1  11.5 - 15.5 %   Platelets 157  150 - 400 K/uL   Neutrophils Relative % 41 (*) 43 - 77 %   Neutro Abs 2.2  1.7 - 7.7 K/uL   Lymphocytes Relative 48 (*) 12 - 46 %   Lymphs Abs 2.6  0.7 - 4.0 K/uL   Monocytes Relative 9  3 - 12 %   Monocytes Absolute 0.5  0.1 - 1.0 K/uL   Eosinophils Relative 2  0 - 5 %   Eosinophils Absolute 0.1  0.0 - 0.7 K/uL   Basophils Relative 0  0 - 1 %   Basophils Absolute 0.0  0.0 - 0.1 K/uL   Comment: Performed at [facility]     Status: Abnormal   Collection Time     11/22/13  7:37 PM      Result Value Ref Range   Sodium 144  137 - 147 mEq/L   Potassium 4.2  3.7 -  5.3 mEq/L   Chloride 101  96 - 112 mEq/L   CO2 32  19 - 32 mEq/L   Glucose, Bld 82  70 - 99 mg/dL   BUN 10  6 - 23 mg/dL   Creatinine, Ser 1.14  0.50 - 1.35 mg/dL   Calcium 10.0  8.4 - 10.5 mg/dL   Total Protein 7.1  6.0 - 8.3 g/dL   Albumin 3.8  3.5 - 5.2 g/dL   AST 12  0 - 37 U/L   ALT 12  0 - 53 U/L   Alkaline Phosphatase 80  39 - 117 U/L   Total Bilirubin 0.5  0.3 - 1.2 mg/dL   GFR calc non Af Amer 81 (*) >90 mL/min   GFR calc Af Amer >90  >90 mL/min   Comment: (NOTE)     The eGFR has been calculated using the CKD EPI equation.     This calculation has not been validated in all clinical situations.     eGFR's persistently <90 mL/min signify possible Chronic Kidney     Disease.     Performed at [facility]  VALPROIC ACID LEVEL     Status: None   Collection Time    11/22/13  7:37 PM      Result Value Ref Range   Valproic Acid Lvl 63.9  50.0 - 100.0 ug/mL   Comment: Performed at [facility]    Physical Findings: AIMS: Facial and Oral Movements Muscles of Facial Expression: None, normal Lips and Perioral Area: None, normal Jaw: None, normal Tongue: None, normal,Extremity Movements Upper (arms, wrists, hands, fingers): None, normal Lower (legs, knees, ankles, toes): None, normal, Trunk Movements Neck, shoulders, hips: None, normal, Overall Severity Severity of abnormal movements (highest score from questions above): None, normal Incapacitation due to abnormal movements: None, normal Patient's awareness of abnormal movements (rate only patient's report): No Awareness, Dental Status Current problems with teeth and/or dentures?: No Does patient usually wear dentures?: No  CIWA:  CIWA-Ar Total: 1 COWS:  COWS Total Score: 2  Treatment Plan Summary: Daily contact with patient to assess and evaluate symptoms and progress in treatment Medication  management  Plan: 1. Continue crisis management and stabilization. 2. Continue medication management by assessing side effects, dose modification as needed and therapeutic blood levels as indicated. Continue Abilify 5 mg BID for improved mood stability, Prozac 40 mg daily for depression, Depakote 1,000 mg hs for mood lability.  3. Treat health problems as indicated or consult IM as needed. 4. Continue treatment plan to decrease risk of relapse upon discharge and to reduce the need for readmission to incorporate the use of local resources for support. 5. Psycho-social education regarding relapse prevention through good self care to incorporate diet, exercise, stress reduction, good sleep hygiene, and reduction in external risk factors such as alcohol, tobacco, and substance abuse. 6. Gain consent for contact with family, PCP, or outside health provider as needed. 7. Review home medications as needed. 8. Disposition in progress may be because the Monday when he continued to show clinical improvement and contracts for safety.  Medical Decision Making Problem Points:  Established problem, stable/improving (1), Review of last therapy session (1) and Review of psycho-social stressors (1) Data Points:  Review or order clinical lab tests (1) Review and summation of old records (2) Review of medication regiment & side effects (2)  I certify that inpatient services furnished can reasonably be expected to improve the patient's condition.   Nanci Pina  FNP-BC  11/23/2013 4:03 PM

## 2013-11-23 NOTE — Progress Notes (Signed)
Writer spoke with patient 1:1 and he reports having had a good day. His wife and kids visited him on today. He c/o not sleeping well las t night d/t this roommate being up and down most of the night and he reports that he didn't feel comfortable sleeping with him up. Writer informed him to Biochemist, clinical or mht know if this is an issue tonight so that different sleep arrangements could be made if needed. He deneis si/hi/a/v hallucinations. Support and encouragement given, he is compliant with his medications. Safety maintained on unit with 15 min checks.

## 2013-11-24 DIAGNOSIS — F3130 Bipolar disorder, current episode depressed, mild or moderate severity, unspecified: Secondary | ICD-10-CM | POA: Diagnosis not present

## 2013-11-24 MED ORDER — ARIPIPRAZOLE 5 MG PO TABS: 5.0000 mg | ORAL_TABLET | Freq: Two times a day (BID) | ORAL | Status: DC

## 2013-11-24 MED ORDER — DIVALPROEX SODIUM 500 MG PO DR TAB: 1000.0000 mg | DELAYED_RELEASE_TABLET | Freq: Every day | ORAL | Status: DC

## 2013-11-24 MED ORDER — FLUOXETINE HCL 40 MG PO CAPS: 40.0000 mg | ORAL_CAPSULE | Freq: Every day | ORAL | Status: DC

## 2013-11-24 NOTE — Tx Team (Signed)
Interdisciplinary Treatment Plan Update (Adult)  Date: 11/24/2013  Time Reviewed:  9:45 AM  Progress in Treatment: Attending groups: Yes Participating in groups:  Yes Taking medication as prescribed:  Yes Tolerating medication:  Yes Family/Significant othe contact made: Yes, with pt's wife Patient understands diagnosis:  Yes Discussing patient identified problems/goals with staff:  Yes Medical problems stabilized or resolved:  Yes Denies suicidal/homicidal ideation: Yes Issues/concerns per patient self-inventory:  Yes Other:  New problem(s) identified: N/A  Discharge Plan or Barriers: Pt will follow up at [facility] for outpatient medication management and therapy.    Reason for Continuation of Hospitalization: Stable to d/c today  Comments: N/A  Estimated length of stay: D/C today  For review of initial/current patient goals, please see plan of care.  Attendees: Patient:  Curtis Townsend  11/24/2013 10:35 AM   Family:     Physician:     Nursing:   Gaylan Gerold, RN 11/24/2013 10:24 AM   Clinical Social Worker:  Regan Lemming, LCSW 11/24/2013 10:24 AM   Other: Nena Polio, [place] 11/24/2013 10:24 AM   Other:  Grayland Ormond, RN 11/24/2013 10:24 AM   Other:  Thurnell Garbe, RN 11/24/2013 10:24 AM   Other:  Lars Pinks, UR case manager 11/24/2013 10:25 AM   Other:    Other:    Other:    Other:    Other:    Other:     Scribe for Treatment Team:   Ane Payment, 11/24/2013 10:24 AM

## 2013-11-24 NOTE — Discharge Summary (Signed)
Physician Discharge Summary Note  Patient:  Curtis Townsend is an 38 y.o., male MRN:  [id number] DOB:  1975-10-07 Patient phone:  [phone] (home)  Patient address:   [place] [address],  Total Time spent with patient: 30 minutes  Date of Admission:  11/18/2013 Date of Discharge: 11/24/2013  Reason for Admission:  Depression with SI  Discharge Diagnoses: Active Problems:   Bipolar 1 disorder, depressed   Psychiatric Specialty Exam:   Please see D/C SRA Physical Exam  ROS  Blood pressure 111/72, pulse 84, temperature 97.4 F (36.3 C), temperature source Oral, resp. rate 16, height '6\' 1"'  (1.854 m), weight 73.936 kg (163 lb), SpO2 100.00%.Body mass index is 21.51 kg/(m^2).    Past Psychiatric History:  Diagnosis: Bipolar disorder   Hospitalizations:[facility]   Outpatient Care: [facility]   Substance Abuse Care: cannabis   Self-Mutilation:denied   Suicidal Attempts:yes   Violent Behaviors:NO    DSM5:  Schizophrenia Disorders:   Obsessive-Compulsive Disorders:   Trauma-Stressor Disorders:   Substance/Addictive Disorders:   Depressive Disorders:    Axis Diagnosis:  Discharge Diagnoses:  AXIS I: Bipolar Depressed  AXIS II: No diagnosis  AXIS III:  Past Medical History   Diagnosis  Date   .  Bipolar 1 disorder    .  Hyperlipemia      no current med.   .  Seasonal allergies    .  History of kidney stones    .  Wears partial dentures      upper   .  Lipoma of back  05/2012     right   .  Depression    .  History of chicken pox    .  Alcoholism    .  Diabetes    .  Depression    .  Anxiety    .  IBS (irritable bowel syndrome)     AXIS IV: other psychosocial or environmental problems  AXIS V: 61-70 mild symptoms   Level of Care:  OP  Hospital Course:        Patient is a 38 years old, disabled, married male admitted voluntarily and emergently from [facility] with increased symptoms of depression, anxiety and status post suicidal ideation and intentional  overdosing on benzodiazepines, vomited shortly after taking them. He admits symptoms of depression, isolation, poor socialization, loss of energy, poor motivation and disturbance of sleep and appetite since January 2015.     Curtis Townsend was admitted to the adult unit. He was evaluated and his symptoms were identified. Medication management was discussed and initiated. He was oriented to the unit and encouraged to participate in unit programming. Medical problems were identified and treated appropriately. Home medication was restarted as needed.        The patient was evaluated each day by a clinical provider to ascertain the patient's response to treatment.  Improvement was noted by the patient's report of decreasing symptoms, improved sleep and appetite, affect, medication tolerance, behavior, and participation in unit programming.  He was asked each day to complete a self inventory noting mood, mental status, pain, new symptoms, anxiety and concerns.         He responded well to medication and being in a therapeutic and supportive environment. Positive and appropriate behavior was noted and the patient was motivated for recovery.  The patient worked closely with the treatment team and case manager to develop a discharge plan with appropriate goals. Coping skills, problem solving as well as relaxation therapies  were also part of the unit programming.         Abe noted that his children ages 58 and 65 were his reasons for seeking care, and his goal for remaining alive. He also states that he did not want to leave his wife with a burden and stated that suicide was selfish.         By the day of discharge he was in much improved condition than upon admission.  Symptoms were reported as significantly decreased or resolved completely. The patient denied SI/HI and voiced no AVH. He was motivated to continue taking medication with a goal of continued improvement in mental health.          Curtis Townsend  was discharged home with a plan to follow up as noted below.  Consults:  None  Significant Diagnostic Studies:  None  Discharge Vitals:   Blood pressure 111/72, pulse 84, temperature 97.4 F (36.3 C), temperature source Oral, resp. rate 16, height '6\' 1"'  (1.854 m), weight 73.936 kg (163 lb), SpO2 100.00%. Body mass index is 21.51 kg/(m^2). Lab Results:   Results for orders placed during the hospital encounter of 11/18/13 (from the past 72 hour(s))  CBC WITH DIFFERENTIAL     Status: Abnormal   Collection Time    11/22/13  7:37 PM      Result Value Ref Range   WBC 5.4  4.0 - 10.5 K/uL   RBC 5.16  4.22 - 5.81 MIL/uL   Hemoglobin 16.3  13.0 - 17.0 g/dL   HCT 47.1  39.0 - 52.0 %   MCV 91.3  78.0 - 100.0 fL   MCH 31.6  26.0 - 34.0 pg   MCHC 34.6  30.0 - 36.0 g/dL   RDW 12.1  11.5 - 15.5 %   Platelets 157  150 - 400 K/uL   Neutrophils Relative % 41 (*) 43 - 77 %   Neutro Abs 2.2  1.7 - 7.7 K/uL   Lymphocytes Relative 48 (*) 12 - 46 %   Lymphs Abs 2.6  0.7 - 4.0 K/uL   Monocytes Relative 9  3 - 12 %   Monocytes Absolute 0.5  0.1 - 1.0 K/uL   Eosinophils Relative 2  0 - 5 %   Eosinophils Absolute 0.1  0.0 - 0.7 K/uL   Basophils Relative 0  0 - 1 %   Basophils Absolute 0.0  0.0 - 0.1 K/uL   Comment: Performed at [facility] PANEL     Status: Abnormal   Collection Time    11/22/13  7:37 PM      Result Value Ref Range   Sodium 144  137 - 147 mEq/L   Potassium 4.2  3.7 - 5.3 mEq/L   Chloride 101  96 - 112 mEq/L   CO2 32  19 - 32 mEq/L   Glucose, Bld 82  70 - 99 mg/dL   BUN 10  6 - 23 mg/dL   Creatinine, Ser 1.14  0.50 - 1.35 mg/dL   Calcium 10.0  8.4 - 10.5 mg/dL   Total Protein 7.1  6.0 - 8.3 g/dL   Albumin 3.8  3.5 - 5.2 g/dL   AST 12  0 - 37 U/L   ALT 12  0 - 53 U/L   Alkaline Phosphatase 80  39 - 117 U/L   Total Bilirubin 0.5  0.3 - 1.2 mg/dL   GFR calc non Af Amer 81 (*) >90 mL/min  GFR calc Af Amer >90  >90 mL/min   Comment:  (NOTE)     The eGFR has been calculated using the CKD EPI equation.     This calculation has not been validated in all clinical situations.     eGFR's persistently <90 mL/min signify possible Chronic Kidney     Disease.     Performed at [facility]  VALPROIC ACID LEVEL     Status: None   Collection Time    11/22/13  7:37 PM      Result Value Ref Range   Valproic Acid Lvl 63.9  50.0 - 100.0 ug/mL   Comment: Performed at [facility]    Physical Findings: AIMS: Facial and Oral Movements Muscles of Facial Expression: None, normal Lips and Perioral Area: None, normal Jaw: None, normal Tongue: None, normal,Extremity Movements Upper (arms, wrists, hands, fingers): None, normal Lower (legs, knees, ankles, toes): None, normal, Trunk Movements Neck, shoulders, hips: None, normal, Overall Severity Severity of abnormal movements (highest score from questions above): None, normal Incapacitation due to abnormal movements: None, normal Patient's awareness of abnormal movements (rate only patient's report): No Awareness, Dental Status Current problems with teeth and/or dentures?: No Does patient usually wear dentures?: No  CIWA:  CIWA-Ar Total: 1 COWS:  COWS Total Score: 2  Psychiatric Specialty Exam: See Psychiatric Specialty Exam and Suicide Risk Assessment completed by Attending Physician prior to discharge.  Discharge destination:  Home  Is patient on multiple antipsychotic therapies at discharge:  No   Has Patient had three or more failed trials of antipsychotic monotherapy by history:  No  Recommended Plan for Multiple Antipsychotic Therapies: NA  Discharge Instructions   Diet - low sodium heart healthy    Complete by:  As directed      Discharge instructions    Complete by:  As directed   Take all of your medications as directed. Be sure to keep all of your follow up appointments.  If you are unable to keep your follow up appointment, call your Doctor's  office to let them know, and reschedule.  Make sure that you have enough medication to last until your appointment. Be sure to get plenty of rest. Going to bed at the same time each night will help. Try to avoid sleeping during the day.  Increase your activity as tolerated. Regular exercise will help you to sleep better and improve your mental health. Eating a heart healthy diet is recommended. Try to avoid salty or fried foods. Be sure to avoid all alcohol and illegal drugs.     Increase activity slowly    Complete by:  As directed             Medication List    STOP taking these medications       acetaminophen 325 MG tablet  Commonly known as:  TYLENOL     multivitamin with minerals tablet      TAKE these medications     Indication   ARIPiprazole 5 MG tablet  Commonly known as:  ABILIFY  Take 1 tablet (5 mg total) by mouth 2 (two) times daily.   Indication:  Major Depressive Disorder     divalproex 500 MG DR tablet  Commonly known as:  DEPAKOTE  Take 2 tablets (1,000 mg total) by mouth at bedtime.   Indication:  mood stability     FLUoxetine 40 MG capsule  Commonly known as:  PROZAC  Take 1 capsule (40 mg total) by mouth  daily.   Indication:  Depression, Depressive Phase of Manic-Depression           Follow-up Information   Follow up with Elgie Congo On 11/24/2013. (Appointment scheduled at 1:20 pm on this date with Pauline Good for medication management)    Contact information:   [address] [place], [place]   [address] [phone]      Follow up with Mr. Frances Furbish On 12/11/2013. (Appointment scheduled at 12:00 pm on this date with Mr. Braulio Conte for therapy)    Contact information:   [address] [place],    [address] [phone]      Follow-up recommendations:   Activities: Resume activity as tolerated. Diet: Heart healthy low sodium diet Tests: Follow up testing will be determined by your out patient provider. Comments:     Total Discharge Time:  Less than 30 minutes.  Signed: Marlane Hatcher. [place] RPAC 11:23 AM 11/24/2013 Personally evaluated the patient and agree with assessment and plan Geralyn Flash A. [place], [place].D

## 2013-11-24 NOTE — Progress Notes (Signed)
D: Patient denies SI/HI/AVH. Patient rates hopelessness as 2,  depression as 2, and anxiety as 0.  Patient affect is flat. Mood is depressed.  Pt states, "My wife and kids came to visit so I'm good.  I just wanted to get my medication regulated.  I feel that are starting to work a little."  Patient did attend evening group. Patient visible on the milieu. No distress noted. A: Support and encouragement offered. Scheduled medications given to pt. Q 15 min checks continued for patient safety. R: Patient receptive. Patient remains safe on the unit.

## 2013-11-24 NOTE — [facility] Group Notes (Signed)
[facility] LCSW Aftercare Discharge Planning Group Note   11/24/2013 8:45 AM  Participation Quality:  Alert, Appropriate and Oriented  Mood/Affect:  Calm  Depression Rating:  0  Anxiety Rating:  0  Thoughts of Suicide:  Pt denies SI/HI  Will you contract for safety?   Yes  Current AVH:  Pt denies  Plan for Discharge/Comments:  Pt attended discharge planning group and actively participated in group.  CSW provided pt with today's workbook.  Pt reports feeling tired today but ready to d/c.  Pt will return home in [place] with family and has follow up scheduled at [facility] for outpatient medication management and therapy.  No further needs voiced by pt at this time.    Transportation Means: Pt reports access to transportation - wife will pick pt up at d/c  Supports: Family is supportive  Regan Lemming, LCSW 11/24/2013 9:45 AM

## 2013-11-24 NOTE — [facility] Suicide Risk Assessment (Signed)
Suicide Risk Assessment  Discharge Assessment     Demographic Factors:  Male  Total Time spent with patient: 45 minutes  Psychiatric Specialty Exam:     Blood pressure 111/72, pulse 84, temperature 97.4 F (36.3 C), temperature source Oral, resp. rate 16, height 6\' 1"  (1.854 m), weight 73.936 kg (163 lb), SpO2 100.00%.Body mass index is 21.51 kg/(m^2).  General Appearance: Fairly Groomed  Engineer, water::  Fair  Speech:  Clear and Coherent  Volume:  Normal  Mood:  Anxious  Affect:  Appropriate  Thought Process:  Coherent and Goal Directed  Orientation:  Full (Time, Place, and Person)  Thought Content:  events, responses, imrpoved coping skills  Suicidal Thoughts:  No  Homicidal Thoughts:  No  Memory:  Immediate;   Fair Recent;   Fair Remote;   Fair  Judgement:  Fair  Insight:  Present  Psychomotor Activity:  Normal  Concentration:  Fair  Recall:  [employer] of Knowledge:NA  Language: Fair  Akathisia:  No  Handed:    AIMS (if indicated):     Assets:  Desire for Improvement Housing Social Support  Sleep:  Number of Hours: 6.5    Musculoskeletal: Strength & Muscle Tone: within normal limits Gait & Station: normal Patient leans: N/A   Mental Status Per Nursing Assessment::   On Admission:  NA  Current Mental Status by Physician: In full contact with reality. There are no active suicidal ideas, plans or intent. Mood is euthymic affect is appropriate he is willing and motivated to pursue outpatient treatment.    Loss Factors: Financial problems/change in socioeconomic status  Historical Factors: NA  Risk Reduction Factors:   Responsible for children under 62 years of age, Sense of responsibility to family, Living with another person, especially a relative and Positive social support  Continued Clinical Symptoms:  Bipolar Disorder:   Depressive phase  Cognitive Features That Contribute To Risk:  Closed-mindedness Loss of executive function Thought  constriction (tunnel vision)    Suicide Risk:  Minimal: No identifiable suicidal ideation.  Patients presenting with no risk factors but with morbid ruminations; may be classified as minimal risk based on the severity of the depressive symptoms  Discharge Diagnoses:   AXIS I:  Bipolar Depressed AXIS II:  No diagnosis AXIS III:   Past Medical History  Diagnosis Date  . Bipolar 1 disorder   . Hyperlipemia     no current med.  . Seasonal allergies   . History of kidney stones   . Wears partial dentures     upper  . Lipoma of back 05/2012    right  . Depression   . History of chicken pox   . Alcoholism   . Diabetes   . Depression   . Anxiety   . IBS (irritable bowel syndrome)    AXIS IV:  other psychosocial or environmental problems AXIS V:  61-70 mild symptoms  Plan Of Care/Follow-up recommendations:  Activity:  as tolerated Diet:  regular Follow up outpatient basis Is patient on multiple antipsychotic therapies at discharge:  No   Has Patient had three or more failed trials of antipsychotic monotherapy by history:  No  Recommended Plan for Multiple Antipsychotic Therapies: NA    Nicholaus Bloom 11/24/2013, 2:14 PM

## 2013-11-24 NOTE — Progress Notes (Signed)
Patient ID: Curtis Townsend, male   DOB: April 22, 1976, 38 y.o.   MRN: [id number] Patient is discharged ambulatory to ride home with his wife.  He denies SI/HI.  He verbalizes understanding of his discharge meds and followup.  He was given a script and a supply of meds by MD.

## 2013-11-24 NOTE — Progress Notes (Signed)
[facility] Adult Case Management Discharge Plan :  Will you be returning to the same living situation after discharge: Yes,  returning home with family support At discharge, do you have transportation home?:Yes,  wife will pick pt up Do you have the ability to pay for your medications:Yes,  provided pt with prescriptions and pt verbalizes ability to afford meds  Release of information consent forms completed and in the chart;  Patient's signature needed at discharge.  Patient to Follow up at: Follow-up Information   Follow up with Pauline Good [facility] On 11/24/2013. (Appointment scheduled at 1:20 pm on this date with Pauline Good for medication management)    Contact information:   [address] [place], [place]   [address] [phone]      Follow up with Mr. Frances Furbish On 12/11/2013. (Appointment scheduled at 12:00 pm on this date with Mr. Braulio Conte for therapy)    Contact information:   [address] [place], [place]   [address] [phone]      Patient denies SI/HI:   Yes,  denies SI/HI    Safety Planning and Suicide Prevention discussed:  Yes,  discussed with pt and pt's wife.  See suicide prevention education note.   Fahd Galea N Horton 11/24/2013, 10:55 AM

## 2013-11-28 NOTE — Progress Notes (Signed)
Patient Discharge Instructions:  After Visit Summary (AVS):   Faxed to:  11/28/13 Discharge Summary Note:   Faxed to:  11/28/13 Psychiatric Admission Assessment Note:   Faxed to:  11/28/13 Suicide Risk Assessment - Discharge Assessment:   Faxed to:  11/28/13 Faxed/Sent to the Next Level Care provider:  11/28/13 Faxed to [facility] [place], 11/28/2013, 2:43 PM

## 2013-12-02 ENCOUNTER — Ambulatory Visit (INDEPENDENT_AMBULATORY_CARE_PROVIDER_SITE_OTHER): Payer: Federal, State, Local not specified - PPO | Admitting: Internal Medicine

## 2013-12-02 DIAGNOSIS — R45851 Suicidal ideations: Secondary | ICD-10-CM

## 2013-12-03 NOTE — Progress Notes (Signed)
   Subjective:    Patient ID: Curtis Townsend, male    DOB: 1976-02-05, 38 y.o.   MRN: [id number]  HPI    Review of Systems     Objective:   Physical Exam  Not seen      Assessment & Plan:

## 2013-12-15 ENCOUNTER — Ambulatory Visit: Payer: Self-pay | Admitting: Internal Medicine

## 2013-12-15 DIAGNOSIS — Z0289 Encounter for other administrative examinations: Secondary | ICD-10-CM

## 2013-12-18 DIAGNOSIS — H40019 Open angle with borderline findings, low risk, unspecified eye: Secondary | ICD-10-CM | POA: Diagnosis not present

## 2013-12-24 ENCOUNTER — Ambulatory Visit: Payer: Self-pay | Admitting: Internal Medicine

## 2013-12-27 ENCOUNTER — Emergency Department (HOSPITAL_COMMUNITY)
Admission: EM | Admit: 2013-12-27 | Discharge: 2013-12-28 | Disposition: A | Discharge status: Home / Self Care | Payer: Federal, State, Local not specified - PPO | Attending: Dermatology | Admitting: Dermatology

## 2013-12-27 ENCOUNTER — Encounter (HOSPITAL_COMMUNITY): Payer: Self-pay | Admitting: Emergency Medicine

## 2013-12-27 DIAGNOSIS — Z87442 Personal history of urinary calculi: Secondary | ICD-10-CM | POA: Insufficient documentation

## 2013-12-27 DIAGNOSIS — F17200 Nicotine dependence, unspecified, uncomplicated: Secondary | ICD-10-CM | POA: Diagnosis not present

## 2013-12-27 DIAGNOSIS — Z79899 Other long term (current) drug therapy: Secondary | ICD-10-CM | POA: Insufficient documentation

## 2013-12-27 DIAGNOSIS — E119 Type 2 diabetes mellitus without complications: Secondary | ICD-10-CM | POA: Diagnosis not present

## 2013-12-27 DIAGNOSIS — Z8619 Personal history of other infectious and parasitic diseases: Secondary | ICD-10-CM | POA: Insufficient documentation

## 2013-12-27 DIAGNOSIS — R45851 Suicidal ideations: Secondary | ICD-10-CM

## 2013-12-27 DIAGNOSIS — F319 Bipolar disorder, unspecified: Secondary | ICD-10-CM

## 2013-12-27 DIAGNOSIS — F411 Generalized anxiety disorder: Secondary | ICD-10-CM | POA: Diagnosis not present

## 2013-12-27 DIAGNOSIS — Z8719 Personal history of other diseases of the digestive system: Secondary | ICD-10-CM | POA: Diagnosis not present

## 2013-12-27 DIAGNOSIS — F314 Bipolar disorder, current episode depressed, severe, without psychotic features: Secondary | ICD-10-CM

## 2013-12-27 DIAGNOSIS — F3130 Bipolar disorder, current episode depressed, mild or moderate severity, unspecified: Secondary | ICD-10-CM | POA: Diagnosis present

## 2013-12-27 DIAGNOSIS — F329 Major depressive disorder, single episode, unspecified: Secondary | ICD-10-CM

## 2013-12-27 DIAGNOSIS — F39 Unspecified mood [affective] disorder: Secondary | ICD-10-CM | POA: Insufficient documentation

## 2013-12-27 DIAGNOSIS — F32A Depression, unspecified: Secondary | ICD-10-CM

## 2013-12-27 LAB — ETHANOL

## 2013-12-27 LAB — COMPREHENSIVE METABOLIC PANEL
ALBUMIN: 4.1 g/dL (ref 3.5–5.2)
ALK PHOS: 69 U/L (ref 39–117)
ALT: 25 U/L (ref 0–53)
AST: 17 U/L (ref 0–37)
BUN: 13 mg/dL (ref 6–23)
CO2: 29 mEq/L (ref 19–32)
Calcium: 9.5 mg/dL (ref 8.4–10.5)
Chloride: 100 mEq/L (ref 96–112)
Creatinine, Ser: 1.35 mg/dL (ref 0.50–1.35)
GFR calc Af Amer: 76 mL/min — ABNORMAL LOW (ref >90)
GFR calc non Af Amer: 65 mL/min — ABNORMAL LOW (ref >90)
GLUCOSE: 95 mg/dL (ref 70–99)
POTASSIUM: 4.5 meq/L (ref 3.7–5.3)
Sodium: 142 mEq/L (ref 137–147)
TOTAL PROTEIN: 7.2 g/dL (ref 6.0–8.3)
Total Bilirubin: 0.5 mg/dL (ref 0.3–1.2)

## 2013-12-27 LAB — ACETAMINOPHEN LEVEL

## 2013-12-27 LAB — RAPID URINE DRUG SCREEN, HOSP PERFORMED
AMPHETAMINES: NOT DETECTED
BENZODIAZEPINES: NOT DETECTED
Barbiturates: NOT DETECTED
Cocaine: NOT DETECTED
Opiates: NOT DETECTED
TETRAHYDROCANNABINOL: POSITIVE — AB

## 2013-12-27 LAB — SALICYLATE LEVEL: Salicylate Lvl: <2 mg/dL — ABNORMAL LOW (ref 2.8–20.0)

## 2013-12-27 LAB — CBC
HCT: 47.4 % (ref 39.0–52.0)
HEMOGLOBIN: 16.5 g/dL (ref 13.0–17.0)
MCH: 31.7 pg (ref 26.0–34.0)
MCHC: 34.8 g/dL (ref 30.0–36.0)
MCV: 91 fL (ref 78.0–100.0)
Platelets: 149 10*3/uL — ABNORMAL LOW (ref 150–400)
RBC: 5.21 MIL/uL (ref 4.22–5.81)
RDW: 12.1 % (ref 11.5–15.5)
WBC: 4.6 10*3/uL (ref 4.0–10.5)

## 2013-12-27 LAB — VALPROIC ACID LEVEL
Valproic Acid Lvl: 99 ug/mL (ref 50.0–100.0)
Valproic Acid Lvl: <10 ug/mL — ABNORMAL LOW (ref 50.0–100.0)

## 2013-12-27 MED ORDER — ACETAMINOPHEN 325 MG PO TABS: 650.0000 mg | ORAL_TABLET | ORAL | Status: DC | PRN

## 2013-12-27 NOTE — Progress Notes (Signed)
Pt has been accepted by [facility] to [facility].  Pt can be transported after 830am.  RN report#[phone].  Wyvonnia Dusky, MHT/NS

## 2013-12-27 NOTE — ED Notes (Signed)
Pt sts he was arguing with his wife about being unemployed and "couldn't take any more of her nagging" so he took medications trying to kill himself. Pt sts he doesn't know if he still feels suicidal.

## 2013-12-27 NOTE — ED Notes (Signed)
Dr. French Ana contacted and informed of the repeat depakote level that poison control request. New orders received and read back.

## 2013-12-27 NOTE — ED Notes (Signed)
Vernice from poison control called and reported that patient need a repeat Depakote level at 2215. Tried to called EDP unable to reach at this time.

## 2013-12-27 NOTE — BH Assessment (Signed)
Referrals faxed to [facility], Rhys Martini, [place], [place].  -Boyce Medici, [place]  Disposition MHT

## 2013-12-27 NOTE — ED Provider Notes (Signed)
CSN: [id number]     Arrival date & time 12/27/13  1513 History   First MD Initiated Contact with Patient 12/27/13 1535     Chief Complaint  Patient presents with  . Suicidal     (Consider location/radiation/quality/duration/timing/severity/associated sxs/prior Treatment) The history is provided by the patient.  pt with hx bipolar disorder, c/o feeling more depressed in the past few weeks. Intermittent suicidal thoughts.  States depressed about job, money, home stress. States approximately 2 hours ago took 2 ambien, 1 abilify and 4 depakote, and several aleve. Spouse call gpd who brought pt to ED, spouse at [place].  Denies other ingestion. States recent physical health good/at baseline, denies recent symptoms. Since ingestion states he feels the same. No nv. No abd pain. No excessive drowsiness.       Past Medical History  Diagnosis Date  . Bipolar 1 disorder   . Hyperlipemia     no current med.  . Seasonal allergies   . History of kidney stones   . Wears partial dentures     upper  . Lipoma of back 05/2012    right  . Depression   . History of chicken pox   . Alcoholism   . Diabetes   . Depression   . Anxiety   . IBS (irritable bowel syndrome)    Past Surgical History  Procedure Laterality Date  . Lipoma excision  06/10/2012    Procedure: EXCISION LIPOMA;  Surgeon: Ralene Ok, MD;  Location: [facility];  Service: General;  Laterality: Right;  excision of back lipoma on right 3x3cm   Family History  Problem Relation Age of Onset  . Stroke Mother   . Hypertension Mother   . Diabetes Mother   . Hypertension Father   . Diabetes Father   . Cancer Paternal Uncle     Prostate Cancer   History  Substance Use Topics  . Smoking status: Current Every Day Smoker -- 0.25 packs/day for 16 years    Types: Cigarettes  . Smokeless tobacco: Never Used     Comment: 1-2 cig./month  . Alcohol Use: Yes     Comment: pt states daily vodka "till  bottle empty"    Review of Systems  Constitutional: Negative for fever.  Eyes: Negative for redness.  Respiratory: Negative for shortness of breath.   Cardiovascular: Negative for chest pain.  Gastrointestinal: Negative for abdominal pain.  Genitourinary: Negative for flank pain.  Musculoskeletal: Negative for back pain and neck pain.  Skin: Negative for rash.  Neurological: Negative for headaches.  Hematological: Does not bruise/bleed easily.  Psychiatric/Behavioral: Positive for suicidal ideas and dysphoric mood.      Allergies  Risperidone and related  Home Medications   Prior to Admission medications   Medication Sig Start Date End Date Taking? Authorizing Provider  ARIPiprazole (ABILIFY) 5 MG tablet Take 1 tablet (5 mg total) by mouth 2 (two) times daily. 11/24/13   Nena Polio, PA-C  divalproex (DEPAKOTE) 500 MG DR tablet Take 2 tablets (1,000 mg total) by mouth at bedtime. 11/24/13   Nena Polio, PA-C  FLUoxetine (PROZAC) 40 MG capsule Take 1 capsule (40 mg total) by mouth daily. 11/24/13   Nena Polio, PA-C   BP 112/67  Pulse 88  Temp(Src) 98.2 F (36.8 C) (Oral)  Resp 18  SpO2 98% Physical Exam  Nursing note and vitals reviewed. Constitutional: He is oriented to person, place, and time. He appears well-developed and well-nourished. No distress.  HENT:  Head:  Atraumatic.  Eyes: Conjunctivae are normal. Pupils are equal, round, and reactive to light. No scleral icterus.  Neck: Neck supple. No tracheal deviation present.  Cardiovascular: Normal rate, regular rhythm, normal heart sounds and intact distal pulses.   Pulmonary/Chest: Effort normal and breath sounds normal. No accessory muscle usage. No respiratory distress.  Abdominal: Soft. Bowel sounds are normal. He exhibits no distension. There is no tenderness.  Musculoskeletal: Normal range of motion. He exhibits no edema and no tenderness.  Neurological: He is alert and oriented to person, place, and time.   Skin: Skin is warm and dry. He is not diaphoretic.  Psychiatric:  Depressed mood, flat affect.  +suicidal thoughts.     ED Course  Procedures (including critical care time) Labs Review  Results for orders placed during the hospital encounter of 12/27/13  CBC      Result Value Ref Range   WBC 4.6  4.0 - 10.5 K/uL   RBC 5.21  4.22 - 5.81 MIL/uL   Hemoglobin 16.5  13.0 - 17.0 g/dL   HCT 47.4  39.0 - 52.0 %   MCV 91.0  78.0 - 100.0 fL   MCH 31.7  26.0 - 34.0 pg   MCHC 34.8  30.0 - 36.0 g/dL   RDW 12.1  11.5 - 15.5 %   Platelets 149 (*) 150 - 400 K/uL  COMPREHENSIVE METABOLIC PANEL      Result Value Ref Range   Sodium 142  137 - 147 mEq/L   Potassium 4.5  3.7 - 5.3 mEq/L   Chloride 100  96 - 112 mEq/L   CO2 29  19 - 32 mEq/L   Glucose, Bld 95  70 - 99 mg/dL   BUN 13  6 - 23 mg/dL   Creatinine, Ser 1.35  0.50 - 1.35 mg/dL   Calcium 9.5  8.4 - 10.5 mg/dL   Total Protein 7.2  6.0 - 8.3 g/dL   Albumin 4.1  3.5 - 5.2 g/dL   AST 17  0 - 37 U/L   ALT 25  0 - 53 U/L   Alkaline Phosphatase 69  39 - 117 U/L   Total Bilirubin 0.5  0.3 - 1.2 mg/dL   GFR calc non Af Amer 65 (*) >90 mL/min   GFR calc Af Amer 76 (*) >90 mL/min  URINE RAPID DRUG SCREEN (HOSP PERFORMED)      Result Value Ref Range   Opiates NONE DETECTED  NONE DETECTED   Cocaine NONE DETECTED  NONE DETECTED   Benzodiazepines NONE DETECTED  NONE DETECTED   Amphetamines NONE DETECTED  NONE DETECTED   Tetrahydrocannabinol POSITIVE (*) NONE DETECTED   Barbiturates NONE DETECTED  NONE DETECTED  ETHANOL      Result Value Ref Range   Alcohol, Ethyl (B) <11  0 - 11 mg/dL  ACETAMINOPHEN LEVEL      Result Value Ref Range   Acetaminophen (Tylenol), Serum <15.0  10 - 30 ug/mL  SALICYLATE LEVEL      Result Value Ref Range   Salicylate Lvl <2.5 (*) 2.8 - 20.0 mg/dL      MDM  Iv ns. Continuous pulse ox and monitor. Vitals. Labs.  Reviewed nursing notes and prior charts for additional history.   Psych team  consulted.  Recheck pt fully awake and alert, no drowsiness, no resp dep, no gi upset.  Pt asymptomatic several hrs post limited ingestion.  Appears stable to move to psych ed for psych team evaluation - anticipate pt will need  inpatient psych tx.      Mirna Mires, MD 12/27/13 [phone]

## 2013-12-27 NOTE — ED Notes (Signed)
Pt brought in with [employer]. Per [employer]- pt wife reports that he took:  2- 5mg  Ambien 1- 5mg  Abilify 4- 500 Depakote 1/2 bottle of Aleve. Pt then took keys and left home. Wife reported to [employer] who then stopped him and brought him here. Pt wife is at [employer] office at this time obtaining IVC papers. Pt wife also reports that he has hx of SI, PTSD, bipolar, and depression. Pt has hx of suicidal attempts.

## 2013-12-27 NOTE — Consult Note (Signed)
[facility] Face-to-Face Psychiatry Consult   Reason for Consult:  Depression Referring Physician:  EDP  Curtis Townsend is an 38 y.o. male. Total Time spent with patient: 20 minutes  Assessment: AXIS I:  Bipolar, Depressed AXIS II:  Deferred AXIS III:   Past Medical History  Diagnosis Date  . Bipolar 1 disorder   . Hyperlipemia     no current med.  . Seasonal allergies   . History of kidney stones   . Wears partial dentures     upper  . Lipoma of back 05/2012    right  . Depression   . History of chicken pox   . Alcoholism   . Diabetes   . Depression   . Anxiety   . IBS (irritable bowel syndrome)    AXIS IV:  other psychosocial or environmental problems, problems related to social environment and problems with primary support group AXIS V:  21-30 behavior considerably influenced by delusions or hallucinations OR serious impairment in judgment, communication OR inability to function in almost all areas  Plan:  Recommend psychiatric Inpatient admission when medically cleared.  Subjective:   Curtis Townsend is a 38 y.o. male patient admitted with depression with suicidal ideations.  HPI:  Patient has a long history of depression (bipolar), disability for his depression.  He overdosed 2 months ago and was at [facility].  Since his discharge, he stated the medication helped at first but then he started taking it sporadically.  His suicidal ideations increased with marital stress.  His wife was concerned for his safety and had him committed.  On exam, he is tearful and irritable with minimal eye contact.  He states he sleeps too much and is having difficulty functioning but does not want help.  He wants to get a divorce and move back to [place], get a job and get "myself back together."  Denies drug use, social drinker (rarely), hallucinations, homicidal ideations.   HPI Elements:   Location:  generalized. Quality:  acute. Severity:  severe. Timing:  constant. Duration:  month. Context:   stressors.  Past Psychiatric History: Past Medical History  Diagnosis Date  . Bipolar 1 disorder   . Hyperlipemia     no current med.  . Seasonal allergies   . History of kidney stones   . Wears partial dentures     upper  . Lipoma of back 05/2012    right  . Depression   . History of chicken pox   . Alcoholism   . Diabetes   . Depression   . Anxiety   . IBS (irritable bowel syndrome)     reports that he has been smoking Cigarettes.  He has a 4 pack-year smoking history. He has never used smokeless tobacco. He reports that he drinks alcohol. He reports that he uses illicit drugs (Marijuana). Family History  Problem Relation Age of Onset  . Stroke Mother   . Hypertension Mother   . Diabetes Mother   . Hypertension Father   . Diabetes Father   . Cancer Paternal Uncle     Prostate Cancer           Allergies:   Allergies  Allergen Reactions  . Risperidone And Related Anaphylaxis    ACT Assessment Complete:  Yes:    Educational Status    Risk to Self: Risk to self Is patient at risk for suicide?: Yes Substance abuse history and/or treatment for substance abuse?: Yes  Risk to Others:    Abuse:  Prior Inpatient Therapy:    Prior Outpatient Therapy:    Additional Information:                    Objective: Blood pressure 112/67, pulse 88, temperature 98.2 F (36.8 C), temperature source Oral, resp. rate 18, SpO2 98.00%.There is no weight on file to calculate BMI. Results for orders placed during the hospital encounter of 12/27/13 (from the past 72 hour(s))  CBC     Status: Abnormal   Collection Time    12/27/13  4:10 PM      Result Value Ref Range   WBC 4.6  4.0 - 10.5 K/uL   RBC 5.21  4.22 - 5.81 MIL/uL   Hemoglobin 16.5  13.0 - 17.0 g/dL   HCT 47.4  39.0 - 52.0 %   MCV 91.0  78.0 - 100.0 fL   MCH 31.7  26.0 - 34.0 pg   MCHC 34.8  30.0 - 36.0 g/dL   RDW 12.1  11.5 - 15.5 %   Platelets 149 (*) 150 - 400 K/uL  COMPREHENSIVE METABOLIC PANEL      Status: Abnormal   Collection Time    12/27/13  4:10 PM      Result Value Ref Range   Sodium 142  137 - 147 mEq/L   Potassium 4.5  3.7 - 5.3 mEq/L   Chloride 100  96 - 112 mEq/L   CO2 29  19 - 32 mEq/L   Glucose, Bld 95  70 - 99 mg/dL   BUN 13  6 - 23 mg/dL   Creatinine, Ser 1.35  0.50 - 1.35 mg/dL   Calcium 9.5  8.4 - 10.5 mg/dL   Total Protein 7.2  6.0 - 8.3 g/dL   Albumin 4.1  3.5 - 5.2 g/dL   AST 17  0 - 37 U/L   ALT 25  0 - 53 U/L   Alkaline Phosphatase 69  39 - 117 U/L   Total Bilirubin 0.5  0.3 - 1.2 mg/dL   GFR calc non Af Amer 65 (*) >90 mL/min   GFR calc Af Amer 76 (*) >90 mL/min   Comment: (NOTE)     The eGFR has been calculated using the CKD EPI equation.     This calculation has not been validated in all clinical situations.     eGFR's persistently <90 mL/min signify possible Chronic Kidney     Disease.  ETHANOL     Status: None   Collection Time    12/27/13  4:10 PM      Result Value Ref Range   Alcohol, Ethyl (B) <11  0 - 11 mg/dL   Comment:            LOWEST DETECTABLE LIMIT FOR     SERUM ALCOHOL IS 11 mg/dL     FOR MEDICAL PURPOSES ONLY  ACETAMINOPHEN LEVEL     Status: None   Collection Time    12/27/13  4:10 PM      Result Value Ref Range   Acetaminophen (Tylenol), Serum <15.0  10 - 30 ug/mL   Comment:            THERAPEUTIC CONCENTRATIONS VARY     SIGNIFICANTLY. A RANGE OF 10-30     ug/mL MAY BE AN EFFECTIVE     CONCENTRATION FOR MANY PATIENTS.     HOWEVER, SOME ARE BEST TREATED     AT CONCENTRATIONS OUTSIDE THIS     RANGE.     ACETAMINOPHEN  CONCENTRATIONS     >150 ug/mL AT 4 HOURS AFTER     INGESTION AND >50 ug/mL AT 12     HOURS AFTER INGESTION ARE     OFTEN ASSOCIATED WITH TOXIC     REACTIONS.  SALICYLATE LEVEL     Status: Abnormal   Collection Time    12/27/13  4:10 PM      Result Value Ref Range   Salicylate Lvl <1.0 (*) 2.8 - 20.0 mg/dL  VALPROIC ACID LEVEL     Status: None   Collection Time    12/27/13  4:11 PM      Result  Value Ref Range   Valproic Acid Lvl 99.0  50.0 - 100.0 ug/mL   Comment: Performed at [place] [facility]     Status: Abnormal   Collection Time    12/27/13  4:19 PM      Result Value Ref Range   Opiates NONE DETECTED  NONE DETECTED   Cocaine NONE DETECTED  NONE DETECTED   Benzodiazepines NONE DETECTED  NONE DETECTED   Amphetamines NONE DETECTED  NONE DETECTED   Tetrahydrocannabinol POSITIVE (*) NONE DETECTED   Barbiturates NONE DETECTED  NONE DETECTED   Comment:            DRUG SCREEN FOR MEDICAL PURPOSES     ONLY.  IF CONFIRMATION IS NEEDED     FOR ANY PURPOSE, NOTIFY LAB     WITHIN 5 DAYS.                LOWEST DETECTABLE LIMITS     FOR URINE DRUG SCREEN     Drug Class       Cutoff (ng/mL)     Amphetamine      1000     Barbiturate      200     Benzodiazepine   626     Tricyclics       948     Opiates          300     Cocaine          300     THC              50   Labs are reviewed and are pertinent for no medical issues noted.  Current Facility-Administered Medications  Medication Dose Route Frequency Provider Last Rate Last Dose  . acetaminophen (TYLENOL) tablet 650 mg  650 mg Oral Q4H PRN Mirna Mires, MD       Current Outpatient Prescriptions  Medication Sig Dispense Refill  . ARIPiprazole (ABILIFY) 5 MG tablet Take 1 tablet (5 mg total) by mouth 2 (two) times daily.      . divalproex (DEPAKOTE) 500 MG DR tablet Take 2 tablets (1,000 mg total) by mouth at bedtime.  60 tablet  5  . FLUoxetine (PROZAC) 40 MG capsule Take 1 capsule (40 mg total) by mouth daily.  30 capsule  0    Psychiatric Specialty Exam:     Blood pressure 112/67, pulse 88, temperature 98.2 F (36.8 C), temperature source Oral, resp. rate 18, SpO2 98.00%.There is no weight on file to calculate BMI.  General Appearance: Casual  Eye Contact::  Poor  Speech:  Slow  Volume:  Decreased  Mood:  Depressed, Hopeless and Irritable  Affect:  Congruent   Thought Process:  Coherent  Orientation:  Full (Time, Place, and Person)  Thought Content:  Rumination  Suicidal Thoughts:  Yes.  with intent/plan  Homicidal Thoughts:  No  Memory:  Immediate;   Fair Recent;   Poor Remote;   Fair  Judgement:  Poor  Insight:  Lacking  Psychomotor Activity:  Decreased  Concentration:  Fair  Recall:  [employer] [place]: Fair  Akathisia:  No  Handed:  Right  AIMS (if indicated):     Assets:  Catering manager Housing Leisure Time Physical Health Resilience Social Support  Sleep:      Musculoskeletal: Strength & Muscle Tone: within normal limits Gait & Station: normal Patient leans: N/A  Treatment Plan Summary: Daily contact with patient to assess and evaluate symptoms and progress in treatment Medication management; inpatient psychiatric hospitalization  Waylan Boga, [place] 12/27/2013 5:40 PM  I have personally seen the patient and agreed with the findings and involved in the treatment plan. Berniece Andreas, MD

## 2013-12-27 NOTE — ED Notes (Signed)
Refusing to answer any questions. "I just want to be left alone." Is very irritable.

## 2013-12-28 DIAGNOSIS — F4310 Post-traumatic stress disorder, unspecified: Secondary | ICD-10-CM | POA: Diagnosis present

## 2013-12-28 DIAGNOSIS — F17200 Nicotine dependence, unspecified, uncomplicated: Secondary | ICD-10-CM | POA: Diagnosis present

## 2013-12-28 DIAGNOSIS — R45851 Suicidal ideations: Secondary | ICD-10-CM | POA: Diagnosis not present

## 2013-12-28 DIAGNOSIS — F411 Generalized anxiety disorder: Secondary | ICD-10-CM | POA: Diagnosis present

## 2013-12-28 DIAGNOSIS — F3130 Bipolar disorder, current episode depressed, mild or moderate severity, unspecified: Secondary | ICD-10-CM

## 2013-12-28 NOTE — ED Notes (Signed)
Left message on answering machine at [facility] office requesting transportation.

## 2013-12-28 NOTE — Discharge Instructions (Signed)
Depression, Adult Depression is feeling sad, low, down in the dumps, blue, gloomy, or empty. In general, there are two kinds of depression:  Normal sadness or grief. This can happen after something upsetting. It often goes away on its own within 2 weeks. After losing a loved one (bereavement), normal sadness and grief may last longer than two weeks. It usually gets better with time.  Clinical depression. This kind lasts longer than normal sadness or grief. It keeps you from doing the things you normally do in life. It is often hard to function at home, work, or at school. It may affect your relationships with others. Treatment is often needed. GET HELP RIGHT AWAY IF:  You have thoughts about hurting yourself or others.  You lose touch with reality (psychotic symptoms). You may:  See or hear things that are not real.  Have untrue beliefs about your life or people around you.  Your medicine is giving you problems. MAKE SURE YOU:  Understand these instructions.  Will watch your condition.  Will get help right away if you are not doing well or get worse. Document Released: 07/29/2010 Document Revised: 03/20/2012 Document Reviewed: 10/26/2011 [facility] Patient Information 2015 [place], [place]. This information is not intended to replace advice given to you by your health care provider. Make sure you discuss any questions you have with your health care provider.  Major Depressive Disorder Major depressive disorder (MDD) is a mental illness. It also may be called clinical depression or unipolar depression. MDD usually causes feelings of sadness, hopelessness, or helplessness. Some people with MDD do not feel particularly sad but lose interest in doing things they used to enjoy (anhedonia). MDD also can cause physical symptoms. It can interfere with work, school, relationships, and other normal everyday activities. MDD varies in severity but is longer lasting and more serious than the sadness we  all feel from time to time in our lives. MDD often is triggered by stressful life events or major life changes. Examples of these triggers include divorce, loss of your job or home, a move, and the death of a family member or close friend. Sometimes MDD occurs for no obvious reason at all. People who have family members with MDD or bipolar disorder are at higher risk for developing MDD, with or without life stressors. MDD can occur at any age. It may occur just once in your life (single episode MDD). It may occur multiple times (recurrent MDD). SYMPTOMS People with MDD have either anhedonia or depressed mood on nearly a daily basis for at least 2 weeks or longer. Symptoms of depressed mood include:  Feelings of sadness (blue or down in the dumps) or emptiness.  Feelings of hopelessness or helplessness.  Tearfulness or episodes of crying (may be observed by others).  Irritability (children and adolescents). In addition to depressed mood or anhedonia or both, people with MDD have at least four of the following symptoms:  Difficulty sleeping or sleeping too much.   Significant change (increase or decrease) in appetite or weight.   Lack of energy or motivation.  Feelings of guilt and worthlessness.   Difficulty concentrating, remembering, or making decisions.  Unusually slow movement (psychomotor retardation) or restlessness (as observed by others).   Recurrent wishes for death, recurrent thoughts of self-harm (suicide), or a suicide attempt. People with MDD commonly have persistent negative thoughts about themselves, other people, and the world. People with severe MDD may experiencedistorted beliefs or perceptions about the world (psychotic delusions). They also may see  or hear things that are not real (psychotic hallucinations). DIAGNOSIS MDD is diagnosed through an assessment by your caregiver. Your caregiver will ask aboutaspects of your daily life, such as mood,sleep, and  appetite, to see if you have the diagnostic symptoms of MDD. Your caregiver may ask about your medical history and use of alcohol or drugs, including prescription medications. Your caregiver also may do a physical exam and blood work. This is because certain medical conditions and the use of certain substances can cause MDD-like symptoms (secondary depression). Your caregiver also may refer you to a mental health specialist for further evaluation and treatment. TREATMENT It is important to recognize the symptoms of MDD and seek treatment. The following treatments can be prescribed for MDD:   Medication--Antidepressant medications usually are prescribed. Antidepressant medications are thought to correct chemical imbalances in the brain that are commonly associated with MDD. Other types of medication may be added if MDD symptoms do not respond to antidepressant medications alone or if psychotic delusions or hallucinations occur.  Talk therapy--Talk therapy can be helpful in treating MDD by providing support, education, and guidance. Certain types of talk therapy also can help with negative thinking (cognitive behavioral therapy) and with relationship issues that trigger MDD (interpersonal therapy). A mental health specialist can help determine which treatment is best for you. Most people with MDD do well with a combination of medication and talk therapy. Treatments involving electrical stimulation of the brain can be used in situations with extremely severe symptoms or when medication and talk therapy do not work over time. These treatments include electroconvulsive therapy, transcranial magnetic stimulation, and vagal nerve stimulation. Document Released: 10/21/2012 Document Reviewed: 10/21/2012 [facility] Patient Information 2015 [place]. This information is not intended to replace advice given to you by your health care provider. Make sure you discuss any questions you have with your health care  provider.

## 2013-12-28 NOTE — ED Notes (Signed)
Has been asleep all morning. Did not wake up until sheriff arrived to transport. Ambulatory without difficulty. No complaints voiced. Denies pain. Escorted out without incident. Belongings bag x1 given to officer.

## 2013-12-28 NOTE — Progress Notes (Signed)
MHT faxed IVC paperwork to [facility] at their request at [phone].  Wyvonnia Dusky, MHT/NS

## 2013-12-28 NOTE — Consult Note (Signed)
[facility] Follow Up Psychiatry Consult   Reason for Consult:  Depression Referring Physician:  EDP  Curtis Townsend is an 38 y.o. male. Total Time spent with patient: 20 minutes  Assessment: AXIS I:  Bipolar, Depressed AXIS II:  Deferred AXIS III:   Past Medical History  Diagnosis Date  . Bipolar 1 disorder   . Hyperlipemia     no current med.  . Seasonal allergies   . History of kidney stones   . Wears partial dentures     upper  . Lipoma of back 05/2012    right  . Depression   . History of chicken pox   . Alcoholism   . Diabetes   . Depression   . Anxiety   . IBS (irritable bowel syndrome)    AXIS IV:  other psychosocial or environmental problems, problems related to social environment and problems with primary support group AXIS V:  21-30 behavior considerably influenced by delusions or hallucinations OR serious impairment in judgment, communication OR inability to function in almost all areas  Plan:  Recommend psychiatric Inpatient admission when medically cleared.  Subjective:   Curtis Townsend is a 38 y.o. male patient admitted with depression with suicidal ideations.  HPI: Patient states that he overdosed on Ibuprofen prior to hospital admission.  "I took overdose of Ibuprofen; I don't know how much I took, but that's all nothing else.  Yea I still feel depressed.  Not suicidal right now.  The sleep helped."  Patient states that he slept well last night.   HPI Elements:   Location:  generalized. Quality:  acute. Severity:  severe. Timing:  constant. Duration:  month. Context:  stressors.  Past Psychiatric History: Past Medical History  Diagnosis Date  . Bipolar 1 disorder   . Hyperlipemia     no current med.  . Seasonal allergies   . History of kidney stones   . Wears partial dentures     upper  . Lipoma of back 05/2012    right  . Depression   . History of chicken pox   . Alcoholism   . Diabetes   . Depression   . Anxiety   . IBS (irritable  bowel syndrome)     reports that he has been smoking Cigarettes.  He has a 4 pack-year smoking history. He has never used smokeless tobacco. He reports that he drinks alcohol. He reports that he uses illicit drugs (Marijuana). Family History  Problem Relation Age of Onset  . Stroke Mother   . Hypertension Mother   . Diabetes Mother   . Hypertension Father   . Diabetes Father   . Cancer Paternal Uncle     Prostate Cancer           Allergies:   Allergies  Allergen Reactions  . Risperidone And Related Anaphylaxis    ACT Assessment Complete:  Yes:    Educational Status    Risk to Self: Risk to self Is patient at risk for suicide?: Yes Substance abuse history and/or treatment for substance abuse?: Yes (+ THC)  Risk to Others:    Abuse:    Prior Inpatient Therapy:    Prior Outpatient Therapy:    Additional Information:      Objective: Blood pressure 102/68, pulse 77, temperature 98.5 F (36.9 C), temperature source Oral, resp. rate 16, SpO2 100.00%.There is no weight on file to calculate BMI. Results for orders placed during the hospital encounter of 12/27/13 (from the past 72 hour(s))  CBC     Status: Abnormal   Collection Time    12/27/13  4:10 PM      Result Value Ref Range   WBC 4.6  4.0 - 10.5 K/uL   RBC 5.21  4.22 - 5.81 MIL/uL   Hemoglobin 16.5  13.0 - 17.0 g/dL   HCT 47.4  39.0 - 52.0 %   MCV 91.0  78.0 - 100.0 fL   MCH 31.7  26.0 - 34.0 pg   MCHC 34.8  30.0 - 36.0 g/dL   RDW 12.1  11.5 - 15.5 %   Platelets 149 (*) 150 - 400 K/uL  COMPREHENSIVE METABOLIC PANEL     Status: Abnormal   Collection Time    12/27/13  4:10 PM      Result Value Ref Range   Sodium 142  137 - 147 mEq/L   Potassium 4.5  3.7 - 5.3 mEq/L   Chloride 100  96 - 112 mEq/L   CO2 29  19 - 32 mEq/L   Glucose, Bld 95  70 - 99 mg/dL   BUN 13  6 - 23 mg/dL   Creatinine, Ser 1.35  0.50 - 1.35 mg/dL   Calcium 9.5  8.4 - 10.5 mg/dL   Total Protein 7.2  6.0 - 8.3 g/dL   Albumin 4.1  3.5 - 5.2  g/dL   AST 17  0 - 37 U/L   ALT 25  0 - 53 U/L   Alkaline Phosphatase 69  39 - 117 U/L   Total Bilirubin 0.5  0.3 - 1.2 mg/dL   GFR calc non Af Amer 65 (*) >90 mL/min   GFR calc Af Amer 76 (*) >90 mL/min   Comment: (NOTE)     The eGFR has been calculated using the CKD EPI equation.     This calculation has not been validated in all clinical situations.     eGFR's persistently <90 mL/min signify possible Chronic Kidney     Disease.  ETHANOL     Status: None   Collection Time    12/27/13  4:10 PM      Result Value Ref Range   Alcohol, Ethyl (B) <11  0 - 11 mg/dL   Comment:            LOWEST DETECTABLE LIMIT FOR     SERUM ALCOHOL IS 11 mg/dL     FOR MEDICAL PURPOSES ONLY  ACETAMINOPHEN LEVEL     Status: None   Collection Time    12/27/13  4:10 PM      Result Value Ref Range   Acetaminophen (Tylenol), Serum <15.0  10 - 30 ug/mL   Comment:            THERAPEUTIC CONCENTRATIONS VARY     SIGNIFICANTLY. A RANGE OF 10-30     ug/mL MAY BE AN EFFECTIVE     CONCENTRATION FOR MANY PATIENTS.     HOWEVER, SOME ARE BEST TREATED     AT CONCENTRATIONS OUTSIDE THIS     RANGE.     ACETAMINOPHEN CONCENTRATIONS     >150 ug/mL AT 4 HOURS AFTER     INGESTION AND >50 ug/mL AT 12     HOURS AFTER INGESTION ARE     OFTEN ASSOCIATED WITH TOXIC     REACTIONS.  SALICYLATE LEVEL     Status: Abnormal   Collection Time    12/27/13  4:10 PM      Result Value Ref Range   Salicylate Lvl <0.8 (*)  2.8 - 20.0 mg/dL  VALPROIC ACID LEVEL     Status: None   Collection Time    12/27/13  4:11 PM      Result Value Ref Range   Valproic Acid Lvl 99.0  50.0 - 100.0 ug/mL   Comment: Performed at [facility]     Status: Abnormal   Collection Time    12/27/13  4:19 PM      Result Value Ref Range   Opiates NONE DETECTED  NONE DETECTED   Cocaine NONE DETECTED  NONE DETECTED   Benzodiazepines NONE DETECTED  NONE DETECTED   Amphetamines NONE DETECTED  NONE DETECTED    Tetrahydrocannabinol POSITIVE (*) NONE DETECTED   Barbiturates NONE DETECTED  NONE DETECTED   Comment:            DRUG SCREEN FOR MEDICAL PURPOSES     ONLY.  IF CONFIRMATION IS NEEDED     FOR ANY PURPOSE, NOTIFY LAB     WITHIN 5 DAYS.                LOWEST DETECTABLE LIMITS     FOR URINE DRUG SCREEN     Drug Class       Cutoff (ng/mL)     Amphetamine      1000     Barbiturate      200     Benzodiazepine   088     Tricyclics       110     Opiates          300     Cocaine          300     THC              50  VALPROIC ACID LEVEL     Status: Abnormal   Collection Time    12/27/13 10:02 PM      Result Value Ref Range   Valproic Acid Lvl <10.0 (*) 50.0 - 100.0 ug/mL   Comment: Performed at [facility] are reviewed and are pertinent for no medical issues noted.  Current Facility-Administered Medications  Medication Dose Route Frequency Mirely Pangle Last Rate Last Dose  . acetaminophen (TYLENOL) tablet 650 mg  650 mg Oral Q4H PRN Mirna Mires, MD       Current Outpatient Prescriptions  Medication Sig Dispense Refill  . ARIPiprazole (ABILIFY) 5 MG tablet Take 1 tablet (5 mg total) by mouth 2 (two) times daily.      . divalproex (DEPAKOTE) 500 MG DR tablet Take 2 tablets (1,000 mg total) by mouth at bedtime.  60 tablet  5  . FLUoxetine (PROZAC) 40 MG capsule Take 1 capsule (40 mg total) by mouth daily.  30 capsule  0    Psychiatric Specialty Exam:     Blood pressure 102/68, pulse 77, temperature 98.5 F (36.9 C), temperature source Oral, resp. rate 16, SpO2 100.00%.There is no weight on file to calculate BMI.  General Appearance: Casual  Eye Contact::  Poor  Speech:  Slow  Volume:  Decreased  Mood:  Depressed, Hopeless and Irritable  Affect:  Congruent  Thought Process:  Coherent  Orientation:  Full (Time, Place, and Person)  Thought Content:  Rumination  Suicidal Thoughts:  Yes.  with intent/plan  Homicidal Thoughts:  No  Memory:  Immediate;    Fair Recent;   Poor Remote;   Fair  Judgement:  Poor  Insight:  Lacking  Psychomotor Activity:  Decreased  Concentration:  Fair  Recall:  Tropic: Fair  Akathisia:  No  Handed:  Right  AIMS (if indicated):     Assets:  Financial Resources/Insurance Housing Leisure Time Physical Health Resilience Social Support  Sleep:      Musculoskeletal: Strength & Muscle Tone: within normal limits Gait & Station: normal Patient leans: N/A  Treatment Plan Summary: Daily contact with patient to assess and evaluate symptoms and progress in treatment Medication management; inpatient psychiatric hospitalization  Will continue with current treatment plan for inpatient treatment.  Patient has been accepted to [facility].  Will monitor for safety and stabilization until transferred.  Rankin, Shuvon, FNP-BC  12/28/2013 11:18 AM  I have personally seen the patient and agreed with the findings and involved in the treatment plan. Berniece Andreas, MD

## 2013-12-28 NOTE — ED Notes (Signed)
Notified Arboriculturist at [facility] that patient has left with sheriff.

## 2013-12-28 NOTE — ED Notes (Signed)
Report called to Arboriculturist at [facility]. Has been accepted by Dr Mervyn Skeeters. Will notify sheriffs dept of transportation need.

## 2013-12-29 DIAGNOSIS — R45851 Suicidal ideations: Secondary | ICD-10-CM | POA: Insufficient documentation

## 2013-12-29 DIAGNOSIS — F4310 Post-traumatic stress disorder, unspecified: Secondary | ICD-10-CM | POA: Insufficient documentation

## 2013-12-29 DIAGNOSIS — F319 Bipolar disorder, unspecified: Secondary | ICD-10-CM | POA: Insufficient documentation

## 2014-01-05 ENCOUNTER — Ambulatory Visit: Payer: Self-pay | Admitting: Internal Medicine

## 2014-01-14 ENCOUNTER — Encounter: Payer: Self-pay | Admitting: Internal Medicine

## 2014-01-14 ENCOUNTER — Ambulatory Visit (INDEPENDENT_AMBULATORY_CARE_PROVIDER_SITE_OTHER): Payer: Federal, State, Local not specified - PPO | Admitting: Internal Medicine

## 2014-01-14 VITALS — BP 100/67 | HR 81 | Temp 98.3°F | Resp 16 | Wt 176.0 lb

## 2014-01-14 DIAGNOSIS — F3132 Bipolar disorder, current episode depressed, moderate: Secondary | ICD-10-CM

## 2014-01-14 MED ORDER — FLUOXETINE HCL 40 MG PO CAPS: 40.0000 mg | ORAL_CAPSULE | Freq: Every day | ORAL | Status: DC

## 2014-01-14 MED ORDER — ARIPIPRAZOLE 5 MG PO TABS: 5.0000 mg | ORAL_TABLET | Freq: Two times a day (BID) | ORAL | Status: DC

## 2014-01-14 NOTE — Patient Instructions (Signed)

## 2014-01-14 NOTE — Progress Notes (Signed)
Pre visit review using our clinic review tool, if applicable. No additional management support is needed unless otherwise documented below in the visit note. 

## 2014-01-15 ENCOUNTER — Encounter: Payer: Self-pay | Admitting: Internal Medicine

## 2014-01-15 NOTE — Progress Notes (Signed)
   Subjective:    Patient ID: Curtis Townsend, male    DOB: 1976-06-13, 38 y.o.   MRN: [id number]  HPI Comments: He returns for f/up after a recent admission to [facility] for suicidal thoughts - he was discharged on seroquel and effexor but has not taken them in one week because they make it hard for him to have an orgasm. He wants to restart prozac and abilify.     Review of Systems  Constitutional: Negative.  Negative for fever, chills, diaphoresis, appetite change and fatigue.  HENT: Negative.   Eyes: Negative.   Respiratory: Negative.  Negative for cough, choking, chest tightness, shortness of breath and stridor.   Cardiovascular: Negative.  Negative for chest pain, palpitations and leg swelling.  Gastrointestinal: Negative.  Negative for abdominal pain.  Endocrine: Negative.   Genitourinary: Negative.   Musculoskeletal: Negative.   Skin: Negative.   Allergic/Immunologic: Negative.   Neurological: Negative.   Hematological: Negative.  Negative for adenopathy. Does not bruise/bleed easily.  Psychiatric/Behavioral: Positive for dysphoric mood. Negative for suicidal ideas, hallucinations, behavioral problems, confusion, sleep disturbance, self-injury, decreased concentration and agitation. The patient is not nervous/anxious and is not hyperactive.        Objective:   Physical Exam  Vitals reviewed. Constitutional: He is oriented to person, place, and time. He appears well-developed and well-nourished. No distress.  HENT:  Head: Normocephalic and atraumatic.  Mouth/Throat: Oropharynx is clear and moist. No oropharyngeal exudate.  Eyes: Conjunctivae are normal. Right eye exhibits no discharge. Left eye exhibits no discharge. No scleral icterus.  Neck: Normal range of motion. Neck supple. No JVD present. No tracheal deviation present. No thyromegaly present.  Cardiovascular: Normal rate, regular rhythm, normal heart sounds and intact distal pulses.  Exam reveals no gallop and no  friction rub.   No murmur heard. Pulmonary/Chest: Effort normal and breath sounds normal. No stridor. No respiratory distress. He has no wheezes. He has no rales. He exhibits no tenderness.  Abdominal: Soft. Bowel sounds are normal. He exhibits no distension and no mass. There is no tenderness. There is no rebound and no guarding.  Musculoskeletal: Normal range of motion. He exhibits no edema and no tenderness.  Lymphadenopathy:    He has no cervical adenopathy.  Neurological: He is oriented to person, place, and time.  Skin: Skin is warm and dry. No rash noted. He is not diaphoretic. No erythema. No pallor.  Psychiatric: His behavior is normal. Judgment and thought content normal. His mood appears not anxious. His affect is angry. His affect is not blunt, not labile and not inappropriate. His speech is not rapid and/or pressured, not delayed, not tangential and not slurred. He is not agitated, not aggressive, not hyperactive, not slowed, not withdrawn, not actively hallucinating and not combative. Cognition and memory are normal. Cognition and memory are not impaired. He does not express impulsivity. He does not exhibit a depressed mood. He expresses no homicidal and no suicidal ideation. He expresses no suicidal plans and no homicidal plans. He is communicative. He is attentive.          Assessment & Plan:

## 2014-01-15 NOTE — Assessment & Plan Note (Signed)
He is scheduled for outpt psych eval here in 2 weeks Will restart prozac and abilify

## 2014-01-28 ENCOUNTER — Ambulatory Visit (INDEPENDENT_AMBULATORY_CARE_PROVIDER_SITE_OTHER): Payer: Federal, State, Local not specified - PPO | Admitting: Psychiatry

## 2014-01-28 ENCOUNTER — Encounter (HOSPITAL_COMMUNITY): Payer: Self-pay | Admitting: Psychiatry

## 2014-01-28 VITALS — BP 107/55 | HR 77 | Ht 73.0 in | Wt 172.8 lb

## 2014-01-28 DIAGNOSIS — F4310 Post-traumatic stress disorder, unspecified: Secondary | ICD-10-CM

## 2014-01-28 DIAGNOSIS — F1210 Cannabis abuse, uncomplicated: Secondary | ICD-10-CM

## 2014-01-28 DIAGNOSIS — F319 Bipolar disorder, unspecified: Secondary | ICD-10-CM

## 2014-01-28 MED ORDER — PRAZOSIN HCL 1 MG PO CAPS: 1.0000 mg | ORAL_CAPSULE | Freq: Every day | ORAL | Status: DC

## 2014-01-28 NOTE — Progress Notes (Signed)
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 his social skills.  I have a long discussion with the patient about his symptoms in need of counseling and therapy.  Patient agree with the plan.  We will scheduled appointment for Eloise Levels.  We will consider adding low-dose Abilify if patient agreed on his next appointment.  I will see him again in 3 weeks. Time spent 55 minutes.  More than 50% of the time spent in psychoeducation, counseling and coordination of care.  Discuss safety plan that anytime having active suicidal thoughts or homicidal thoughts then patient need to call 911 or go to the local emergency  room.    Saburo Luger T., MD 01/28/2014

## 2014-01-29 ENCOUNTER — Ambulatory Visit (INDEPENDENT_AMBULATORY_CARE_PROVIDER_SITE_OTHER): Payer: Federal, State, Local not specified - PPO | Admitting: Psychiatry

## 2014-01-29 DIAGNOSIS — F4310 Post-traumatic stress disorder, unspecified: Secondary | ICD-10-CM

## 2014-01-29 DIAGNOSIS — F319 Bipolar disorder, unspecified: Secondary | ICD-10-CM

## 2014-01-29 DIAGNOSIS — F3130 Bipolar disorder, current episode depressed, mild or moderate severity, unspecified: Secondary | ICD-10-CM
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
## 2014-02-12 ENCOUNTER — Other Ambulatory Visit (INDEPENDENT_AMBULATORY_CARE_PROVIDER_SITE_OTHER): Payer: Federal, State, Local not specified - PPO

## 2014-02-12 ENCOUNTER — Encounter: Payer: Self-pay | Admitting: Internal Medicine

## 2014-02-12 ENCOUNTER — Ambulatory Visit (INDEPENDENT_AMBULATORY_CARE_PROVIDER_SITE_OTHER): Payer: Federal, State, Local not specified - PPO | Admitting: Internal Medicine

## 2014-02-12 ENCOUNTER — Ambulatory Visit (INDEPENDENT_AMBULATORY_CARE_PROVIDER_SITE_OTHER)
Admission: RE | Admit: 2014-02-12 | Discharge: 2014-02-12 | Disposition: A | Discharge status: Home / Self Care | Payer: Federal, State, Local not specified - PPO | Source: Ambulatory Visit | Attending: Internal Medicine | Admitting: Internal Medicine

## 2014-02-12 VITALS — BP 104/69 | HR 86 | Temp 98.5°F | Resp 16 | Ht 73.0 in | Wt 172.0 lb

## 2014-02-12 DIAGNOSIS — R079 Chest pain, unspecified: Secondary | ICD-10-CM | POA: Diagnosis not present

## 2014-02-12 DIAGNOSIS — R9431 Abnormal electrocardiogram [ECG] [EKG]: Secondary | ICD-10-CM | POA: Diagnosis not present

## 2014-02-12 DIAGNOSIS — R0609 Other forms of dyspnea: Secondary | ICD-10-CM | POA: Insufficient documentation

## 2014-02-12 DIAGNOSIS — R0602 Shortness of breath: Secondary | ICD-10-CM

## 2014-02-12 DIAGNOSIS — R946 Abnormal results of thyroid function studies: Secondary | ICD-10-CM

## 2014-02-12 DIAGNOSIS — R0789 Other chest pain: Secondary | ICD-10-CM | POA: Insufficient documentation

## 2014-02-12 DIAGNOSIS — R7989 Other specified abnormal findings of blood chemistry: Secondary | ICD-10-CM

## 2014-02-12 LAB — TROPONIN I

## 2014-02-12 LAB — CBC WITH DIFFERENTIAL/PLATELET
BASOS PCT: 0.7 % (ref 0.0–3.0)
Basophils Absolute: 0 10*3/uL (ref 0.0–0.1)
EOS PCT: 1.5 % (ref 0.0–5.0)
Eosinophils Absolute: 0.1 10*3/uL (ref 0.0–0.7)
HEMATOCRIT: 52.6 % — AB (ref 39.0–52.0)
HEMOGLOBIN: 17.5 g/dL — AB (ref 13.0–17.0)
LYMPHS ABS: 2.7 10*3/uL (ref 0.7–4.0)
Lymphocytes Relative: 49.1 % — ABNORMAL HIGH (ref 12.0–46.0)
MCHC: 33.3 g/dL (ref 30.0–36.0)
MCV: 94.1 fl (ref 78.0–100.0)
Monocytes Absolute: 0.4 10*3/uL (ref 0.1–1.0)
Monocytes Relative: 7.1 % (ref 3.0–12.0)
NEUTROS ABS: 2.3 10*3/uL (ref 1.4–7.7)
NEUTROS PCT: 41.6 % — AB (ref 43.0–77.0)
Platelets: 179 10*3/uL (ref 150.0–400.0)
RBC: 5.59 Mil/uL (ref 4.22–5.81)
RDW: 13.1 % (ref 11.5–15.5)
WBC: 5.5 10*3/uL (ref 4.0–10.5)

## 2014-02-12 LAB — D-DIMER, QUANTITATIVE: D-Dimer, Quant: 0.27 ug/mL-FEU (ref 0.00–0.48)

## 2014-02-12 NOTE — Progress Notes (Signed)
Pre visit review using our clinic review tool, if applicable. No additional management support is needed unless otherwise documented below in the visit note. 

## 2014-02-12 NOTE — Patient Instructions (Signed)

## 2014-02-13 ENCOUNTER — Telehealth: Payer: Self-pay | Admitting: Internal Medicine

## 2014-02-13 LAB — BASIC METABOLIC PANEL
BUN: 9 mg/dL (ref 6–23)
CO2: 30 mEq/L (ref 19–32)
Calcium: 9.9 mg/dL (ref 8.4–10.5)
Chloride: 100 mEq/L (ref 96–112)
Creatinine, Ser: 1.1 mg/dL (ref 0.4–1.5)
GFR: 92.38 mL/min (ref >60.00)
GLUCOSE: 81 mg/dL (ref 70–99)
POTASSIUM: 4.2 meq/L (ref 3.5–5.1)
Sodium: 138 mEq/L (ref 135–145)

## 2014-02-13 LAB — CARDIAC PANEL
CK-MB: 1.9 ng/mL (ref 0.3–4.0)
Relative Index: 1.1 calc (ref 0.0–2.5)
Total CK: 177 U/L (ref 7–232)

## 2014-02-13 NOTE — Telephone Encounter (Signed)
Relevant patient education mailed to patient.  

## 2014-02-15 DIAGNOSIS — R7989 Other specified abnormal findings of blood chemistry: Secondary | ICD-10-CM | POA: Insufficient documentation

## 2014-02-15 NOTE — Assessment & Plan Note (Signed)
I don't this is related to his s/s but I have asked him to RTC for further thyroid testing

## 2014-02-15 NOTE — Assessment & Plan Note (Signed)
His EKG shows NSR with RSR and LAE I will check a CXR today to look for lung pathology and will screen for PE and ischemia

## 2014-02-15 NOTE — Progress Notes (Signed)
Subjective:    Patient ID: Curtis Townsend, male    DOB: 16-Jan-1976, 38 y.o.   MRN: [id number]  HPI Comments: He returns and complains of a one month history of s/s that occur only when he is having sex - sharp left-sided pain around the shoulder blade, sob, palpitations, diffuse numbness, dizziness and light headedness. The s/s resolve spontaneously after sex.  Shortness of Breath Pertinent negatives include no abdominal pain, chest pain, fever, headaches, leg swelling, rash, vomiting or wheezing.      Review of Systems  Constitutional: Negative.  Negative for fever, chills, diaphoresis, activity change, appetite change, fatigue and unexpected weight change.  HENT: Negative.   Eyes: Negative.  Negative for visual disturbance.  Respiratory: Positive for shortness of breath. Negative for apnea, cough, choking, chest tightness, wheezing and stridor.   Cardiovascular: Negative.  Negative for chest pain, palpitations and leg swelling.  Gastrointestinal: Negative.  Negative for nausea, vomiting, abdominal pain, constipation and blood in stool.  Endocrine: Negative.   Genitourinary: Negative.   Musculoskeletal: Negative.  Negative for arthralgias, back pain and myalgias.  Skin: Negative.  Negative for rash.  Allergic/Immunologic: Negative.   Neurological: Positive for dizziness and light-headedness. Negative for facial asymmetry and headaches.  Hematological: Negative.  Negative for adenopathy. Does not bruise/bleed easily.  Psychiatric/Behavioral: Negative.        Objective:   Physical Exam  Vitals reviewed. Constitutional: He is oriented to person, place, and time. He appears well-developed and well-nourished. No distress.  HENT:  Head: Normocephalic and atraumatic.  Mouth/Throat: Oropharynx is clear and moist. No oropharyngeal exudate.  Eyes: Conjunctivae are normal. Right eye exhibits no discharge. Left eye exhibits no discharge. No scleral icterus.  Neck: Normal range of  motion. Neck supple. No JVD present. No tracheal deviation present. No thyromegaly present.  Cardiovascular: Normal rate, regular rhythm, normal heart sounds and intact distal pulses.  Exam reveals no gallop and no friction rub.   No murmur heard. Pulmonary/Chest: Effort normal and breath sounds normal. No accessory muscle usage or stridor. Not tachypneic. No respiratory distress. He has no decreased breath sounds. He has no wheezes. He has no rhonchi. He has no rales. He exhibits no mass, no tenderness, no edema, no deformity and no swelling. Right breast exhibits no inverted nipple, no mass, no nipple discharge, no skin change and no tenderness. Left breast exhibits no inverted nipple, no mass, no nipple discharge, no skin change and no tenderness. Breasts are symmetrical.  Abdominal: Soft. Bowel sounds are normal. He exhibits no distension and no mass. There is no tenderness. There is no rebound and no guarding.  Musculoskeletal: Normal range of motion. He exhibits no edema and no tenderness.  Lymphadenopathy:    He has no cervical adenopathy.  Neurological: He is oriented to person, place, and time.  Skin: Skin is warm and dry. No rash noted. He is not diaphoretic. No erythema. No pallor.  Psychiatric: His speech is normal and behavior is normal. Judgment and thought content normal. His mood appears not anxious. His affect is not angry, not blunt, not labile and not inappropriate. He is not agitated, not slowed, not withdrawn and not combative. Cognition and memory are normal. He does not exhibit a depressed mood. He expresses no homicidal and no suicidal ideation. He expresses no suicidal plans and no homicidal plans. He is attentive.      Lab Results  Component Value Date   WBC 5.5 02/12/2014   HGB 17.5* 02/12/2014   HCT 52.6*  02/12/2014   PLT 179.0 02/12/2014   GLUCOSE 81 02/12/2014   CHOL 177 09/02/2012   TRIG 49.0 09/02/2012   HDL 54.00 09/02/2012   LDLCALC 113* 09/02/2012   ALT 25 12/27/2013    AST 17 12/27/2013   NA 138 02/12/2014   K 4.2 02/12/2014   CL 100 02/12/2014   CREATININE 1.1 02/12/2014   BUN 9 02/12/2014   CO2 30 02/12/2014   TSH 0.132* 11/20/2013   HGBA1C 5.7 09/02/2012      Assessment & Plan:

## 2014-02-15 NOTE — Assessment & Plan Note (Signed)
CXR is normal, EKG is mildly abnormal but this is probably a normal variant His cardiac enzymes and d-dimer are normal I think this is related to anxiety and dose not require any further investigation, I offered him reassurance

## 2014-02-15 NOTE — Assessment & Plan Note (Signed)
Testing is all normal I think this is related to anxiety and THC abuse Will follow for now

## 2014-02-18 ENCOUNTER — Ambulatory Visit (HOSPITAL_COMMUNITY): Payer: Self-pay | Admitting: Psychiatry

## 2014-02-19 DIAGNOSIS — H16109 Unspecified superficial keratitis, unspecified eye: Secondary | ICD-10-CM | POA: Diagnosis not present

## 2014-02-20 ENCOUNTER — Telehealth (HOSPITAL_COMMUNITY): Payer: Self-pay

## 2014-02-22 ENCOUNTER — Other Ambulatory Visit (HOSPITAL_COMMUNITY): Payer: Self-pay | Admitting: Psychiatry

## 2014-02-26 ENCOUNTER — Telehealth (HOSPITAL_COMMUNITY): Payer: Self-pay

## 2014-02-27 ENCOUNTER — Ambulatory Visit (HOSPITAL_COMMUNITY): Payer: Self-pay | Admitting: Psychiatry

## 2014-03-02 ENCOUNTER — Ambulatory Visit (HOSPITAL_COMMUNITY): Payer: Self-pay | Admitting: Psychiatry

## 2014-03-02 NOTE — Telephone Encounter (Signed)
I returned patient's wife for phone call.  Patient is arrested because of felony charges.  He has not had sentenced yet.  I recommended to contact the mental health Court and if they need any records that have patient signed to released information.  Wife is unclear how long patient will remain in jail.  I suggested that we can provide a 30 day emergency prescription only and after that he has to be seen by psychiatrist for medication management.

## 2014-03-11 ENCOUNTER — Telehealth (HOSPITAL_COMMUNITY): Payer: Self-pay

## 2014-03-21 ENCOUNTER — Other Ambulatory Visit (HOSPITAL_COMMUNITY): Payer: Self-pay | Admitting: Psychiatry

## 2014-03-23 ENCOUNTER — Telehealth (HOSPITAL_COMMUNITY): Payer: Self-pay | Admitting: *Deleted

## 2014-03-23 DIAGNOSIS — F4310 Post-traumatic stress disorder, unspecified: Secondary | ICD-10-CM

## 2014-03-23 MED ORDER — PRAZOSIN HCL 1 MG PO CAPS: 1.0000 mg | ORAL_CAPSULE | Freq: Every day | ORAL | Status: DC

## 2014-03-23 NOTE — Telephone Encounter (Signed)
Spouse left VM::Needs 30 day supply of Minipress.Spoke to [name] a few weeks ago wehn husband first detained and he said he would give 30 day supply Contacted spouse.Per Ms. Minogue, pt not yet released, but has no more Minipress at home for when he is released (was taken to [place] and given to him there). She is hoping he is released any day now.States she will make appt as soon as she knows his release date. She wants him to have medicine available to resume taking at home as soon as he is released. Requests 30 day supply

## 2014-04-15 ENCOUNTER — Other Ambulatory Visit (INDEPENDENT_AMBULATORY_CARE_PROVIDER_SITE_OTHER): Payer: Federal, State, Local not specified - PPO

## 2014-04-15 DIAGNOSIS — R7989 Other specified abnormal findings of blood chemistry: Secondary | ICD-10-CM

## 2014-04-15 DIAGNOSIS — R946 Abnormal results of thyroid function studies: Secondary | ICD-10-CM

## 2014-04-15 LAB — TSH: TSH: 0.34 u[IU]/mL — ABNORMAL LOW (ref 0.35–4.50)

## 2014-04-15 LAB — T4: T4 TOTAL: 6.2 ug/dL (ref 4.5–12.0)

## 2014-04-15 LAB — T3, FREE: T3, Free: 2.4 pg/mL (ref 2.3–4.2)

## 2014-04-22 ENCOUNTER — Encounter: Payer: Self-pay | Admitting: Internal Medicine

## 2014-04-22 ENCOUNTER — Ambulatory Visit (INDEPENDENT_AMBULATORY_CARE_PROVIDER_SITE_OTHER): Payer: Federal, State, Local not specified - PPO | Admitting: Internal Medicine

## 2014-04-22 ENCOUNTER — Ambulatory Visit (INDEPENDENT_AMBULATORY_CARE_PROVIDER_SITE_OTHER)
Admission: RE | Admit: 2014-04-22 | Discharge: 2014-04-22 | Disposition: A | Discharge status: Home / Self Care | Payer: Federal, State, Local not specified - PPO | Source: Ambulatory Visit | Attending: Internal Medicine | Admitting: Internal Medicine

## 2014-04-22 VITALS — BP 108/64 | HR 81 | Temp 98.6°F | Resp 16 | Ht 73.0 in | Wt 173.0 lb

## 2014-04-22 DIAGNOSIS — G8929 Other chronic pain: Secondary | ICD-10-CM

## 2014-04-22 DIAGNOSIS — M25519 Pain in unspecified shoulder: Secondary | ICD-10-CM

## 2014-04-22 DIAGNOSIS — R946 Abnormal results of thyroid function studies: Secondary | ICD-10-CM | POA: Diagnosis not present

## 2014-04-22 DIAGNOSIS — M25512 Pain in left shoulder: Secondary | ICD-10-CM

## 2014-04-22 DIAGNOSIS — R7989 Other specified abnormal findings of blood chemistry: Secondary | ICD-10-CM

## 2014-04-22 DIAGNOSIS — R9431 Abnormal electrocardiogram [ECG] [EKG]: Secondary | ICD-10-CM | POA: Diagnosis not present

## 2014-04-22 MED ORDER — DICLOFENAC 18 MG PO CAPS: 1.0000 | ORAL_CAPSULE | Freq: Three times a day (TID) | ORAL | Status: DC | PRN

## 2014-04-22 NOTE — Progress Notes (Signed)
Pre visit review using our clinic review tool, if applicable. No additional management support is needed unless otherwise documented below in the visit note. 

## 2014-04-22 NOTE — Patient Instructions (Signed)
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
## 2014-04-24 NOTE — Assessment & Plan Note (Signed)
His recent TSH was slightly suppressed, but other TFT's were normal He appears to be euthyroid Will follow for now

## 2014-04-24 NOTE — Assessment & Plan Note (Signed)
Plain xray is normal I think he has a rotator cuff tendonitis, will start nsaids and consider PT if needed

## 2014-04-27 ENCOUNTER — Ambulatory Visit (INDEPENDENT_AMBULATORY_CARE_PROVIDER_SITE_OTHER): Payer: Federal, State, Local not specified - PPO | Admitting: Psychiatry

## 2014-04-27 ENCOUNTER — Encounter (HOSPITAL_COMMUNITY): Payer: Self-pay | Admitting: Psychiatry

## 2014-04-27 VITALS — BP 122/76 | HR 75 | Ht 73.0 in | Wt 174.0 lb

## 2014-04-27 DIAGNOSIS — F4310 Post-traumatic stress disorder, unspecified: Secondary | ICD-10-CM

## 2014-04-27 DIAGNOSIS — F3189 Other bipolar disorder: Secondary | ICD-10-CM

## 2014-04-27 DIAGNOSIS — F1210 Cannabis abuse, uncomplicated: Secondary | ICD-10-CM

## 2014-04-27 DIAGNOSIS — F3132 Bipolar disorder, current episode depressed, moderate: Secondary | ICD-10-CM

## 2014-04-27 DIAGNOSIS — F3163 Bipolar disorder, current episode mixed, severe, without psychotic features: Secondary | ICD-10-CM

## 2014-04-27 MED ORDER — RISPERIDONE 1 MG PO TABS: 1.0000 mg | ORAL_TABLET | Freq: Every day | ORAL | Status: DC

## 2014-04-27 MED ORDER — BENZTROPINE MESYLATE 1 MG PO TABS: 1.0000 mg | ORAL_TABLET | Freq: Every day | ORAL | Status: DC
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
## 2014-05-06 DIAGNOSIS — H40053 Ocular hypertension, bilateral: Secondary | ICD-10-CM | POA: Diagnosis not present

## 2014-05-06 DIAGNOSIS — E11329 Type 2 diabetes mellitus with mild nonproliferative diabetic retinopathy without macular edema: Secondary | ICD-10-CM | POA: Diagnosis not present

## 2014-05-06 DIAGNOSIS — H40013 Open angle with borderline findings, low risk, bilateral: Secondary | ICD-10-CM | POA: Diagnosis not present

## 2014-05-25 ENCOUNTER — Ambulatory Visit (INDEPENDENT_AMBULATORY_CARE_PROVIDER_SITE_OTHER): Payer: Federal, State, Local not specified - PPO | Admitting: Psychiatry

## 2014-05-25 ENCOUNTER — Encounter (HOSPITAL_COMMUNITY): Payer: Self-pay | Admitting: Psychiatry

## 2014-05-25 VITALS — BP 115/67 | HR 86 | Ht 73.0 in | Wt 182.6 lb

## 2014-05-25 DIAGNOSIS — F1290 Cannabis use, unspecified, uncomplicated: Secondary | ICD-10-CM

## 2014-05-25 DIAGNOSIS — F3132 Bipolar disorder, current episode depressed, moderate: Secondary | ICD-10-CM

## 2014-05-25 DIAGNOSIS — F3163 Bipolar disorder, current episode mixed, severe, without psychotic features: Secondary | ICD-10-CM

## 2014-05-25 DIAGNOSIS — F319 Bipolar disorder, unspecified: Secondary | ICD-10-CM

## 2014-05-25 DIAGNOSIS — F4310 Post-traumatic stress disorder, unspecified: Secondary | ICD-10-CM

## 2014-05-25 MED ORDER — RISPERIDONE 1 MG PO TABS: 1.0000 mg | ORAL_TABLET | Freq: Every day | ORAL | Status: DC

## 2014-05-25 MED ORDER — BENZTROPINE MESYLATE 1 MG PO TABS: 1.0000 mg | ORAL_TABLET | Freq: Every day | ORAL | Status: DC

## 2014-05-25 MED ORDER — DIVALPROEX SODIUM ER 500 MG PO TB24: 1000.0000 mg | ORAL_TABLET | Freq: Every day | ORAL | Status: DC

## 2014-05-25 MED ORDER — FLUOXETINE HCL 40 MG PO CAPS: 40.0000 mg | ORAL_CAPSULE | Freq: Every day | ORAL | Status: DC
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
## 2014-05-29 ENCOUNTER — Ambulatory Visit (INDEPENDENT_AMBULATORY_CARE_PROVIDER_SITE_OTHER): Payer: Federal, State, Local not specified - PPO | Admitting: Psychiatry

## 2014-05-29 DIAGNOSIS — F3163 Bipolar disorder, current episode mixed, severe, without psychotic features: Secondary | ICD-10-CM
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
## 2014-06-02 ENCOUNTER — Ambulatory Visit (INDEPENDENT_AMBULATORY_CARE_PROVIDER_SITE_OTHER): Payer: Federal, State, Local not specified - PPO | Admitting: Psychiatry

## 2014-06-02 DIAGNOSIS — F3163 Bipolar disorder, current episode mixed, severe, without psychotic features: Secondary | ICD-10-CM
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
## 2014-06-03 DIAGNOSIS — L600 Ingrowing nail: Secondary | ICD-10-CM | POA: Diagnosis not present

## 2014-06-03 DIAGNOSIS — B351 Tinea unguium: Secondary | ICD-10-CM | POA: Diagnosis not present

## 2014-06-11 ENCOUNTER — Ambulatory Visit (INDEPENDENT_AMBULATORY_CARE_PROVIDER_SITE_OTHER): Payer: Federal, State, Local not specified - PPO | Admitting: Psychiatry

## 2014-06-11 DIAGNOSIS — F3163 Bipolar disorder, current episode mixed, severe, without psychotic features: Secondary | ICD-10-CM
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
## 2014-06-23 LAB — HEMOGLOBIN A1C
HEMOGLOBIN A1C: 5.7 % — AB (ref <5.7)
MEAN PLASMA GLUCOSE: 117 mg/dL — AB (ref <117)

## 2014-06-23 LAB — VALPROIC ACID LEVEL: Valproic Acid Lvl: 84.3 ug/mL (ref 50.0–100.0)

## 2014-07-27 ENCOUNTER — Encounter (HOSPITAL_COMMUNITY): Payer: Self-pay | Admitting: Psychiatry

## 2014-07-27 ENCOUNTER — Ambulatory Visit (INDEPENDENT_AMBULATORY_CARE_PROVIDER_SITE_OTHER): Payer: Federal, State, Local not specified - PPO | Admitting: Psychiatry

## 2014-07-27 VITALS — BP 135/76 | HR 80 | Ht 68.0 in | Wt 189.0 lb

## 2014-07-27 DIAGNOSIS — F4310 Post-traumatic stress disorder, unspecified: Secondary | ICD-10-CM

## 2014-07-27 DIAGNOSIS — F3163 Bipolar disorder, current episode mixed, severe, without psychotic features: Secondary | ICD-10-CM

## 2014-07-27 DIAGNOSIS — F319 Bipolar disorder, unspecified: Secondary | ICD-10-CM

## 2014-07-27 DIAGNOSIS — F1210 Cannabis abuse, uncomplicated: Secondary | ICD-10-CM

## 2014-07-27 MED ORDER — DIVALPROEX SODIUM ER 500 MG PO TB24: 1000.0000 mg | ORAL_TABLET | Freq: Every day | ORAL | Status: DC

## 2014-07-27 MED ORDER — FLUOXETINE HCL 40 MG PO CAPS: 40.0000 mg | ORAL_CAPSULE | Freq: Every day | ORAL | Status: DC

## 2014-07-27 MED ORDER — RISPERIDONE 1 MG PO TABS: 1.0000 mg | ORAL_TABLET | Freq: Every day | ORAL | Status: DC
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
## 2014-08-14 ENCOUNTER — Encounter (HOSPITAL_COMMUNITY): Payer: Self-pay | Admitting: Emergency Medicine

## 2014-08-14 ENCOUNTER — Emergency Department (HOSPITAL_COMMUNITY)
Admission: EM | Admit: 2014-08-14 | Discharge: 2014-08-14 | Disposition: A | Discharge status: Home / Self Care | Payer: Federal, State, Local not specified - PPO | Attending: Emergency Medicine | Admitting: Emergency Medicine

## 2014-08-14 DIAGNOSIS — F319 Bipolar disorder, unspecified: Secondary | ICD-10-CM | POA: Insufficient documentation

## 2014-08-14 DIAGNOSIS — Y9289 Other specified places as the place of occurrence of the external cause: Secondary | ICD-10-CM | POA: Diagnosis not present

## 2014-08-14 DIAGNOSIS — Y288XXA Contact with other sharp object, undetermined intent, initial encounter: Secondary | ICD-10-CM | POA: Insufficient documentation

## 2014-08-14 DIAGNOSIS — Y9389 Activity, other specified: Secondary | ICD-10-CM | POA: Diagnosis not present

## 2014-08-14 DIAGNOSIS — Z87442 Personal history of urinary calculi: Secondary | ICD-10-CM | POA: Insufficient documentation

## 2014-08-14 DIAGNOSIS — Z720 Tobacco use: Secondary | ICD-10-CM | POA: Insufficient documentation

## 2014-08-14 DIAGNOSIS — Z79899 Other long term (current) drug therapy: Secondary | ICD-10-CM | POA: Insufficient documentation

## 2014-08-14 DIAGNOSIS — E119 Type 2 diabetes mellitus without complications: Secondary | ICD-10-CM | POA: Insufficient documentation

## 2014-08-14 DIAGNOSIS — E785 Hyperlipidemia, unspecified: Secondary | ICD-10-CM | POA: Insufficient documentation

## 2014-08-14 DIAGNOSIS — R42 Dizziness and giddiness: Secondary | ICD-10-CM | POA: Diagnosis not present

## 2014-08-14 DIAGNOSIS — F419 Anxiety disorder, unspecified: Secondary | ICD-10-CM | POA: Diagnosis not present

## 2014-08-14 DIAGNOSIS — S01311A Laceration without foreign body of right ear, initial encounter: Secondary | ICD-10-CM

## 2014-08-14 DIAGNOSIS — Z8719 Personal history of other diseases of the digestive system: Secondary | ICD-10-CM | POA: Diagnosis not present

## 2014-08-14 DIAGNOSIS — B351 Tinea unguium: Secondary | ICD-10-CM | POA: Diagnosis not present

## 2014-08-14 DIAGNOSIS — Y998 Other external cause status: Secondary | ICD-10-CM | POA: Insufficient documentation

## 2014-08-14 MED ORDER — LIDOCAINE HCL (PF) 1 % IJ SOLN
INTRAMUSCULAR | Status: AC
  Filled 2014-08-14: qty 5

## 2014-08-14 MED ORDER — LIDOCAINE HCL (PF) 1 % IJ SOLN
5.0000 mL | Freq: Once | INTRAMUSCULAR | Status: AC
  Administered 2014-08-14: 5 mL

## 2014-08-14 NOTE — Discharge Instructions (Signed)
Auricle Injuries You have an injury to your external ear (auricle). The ear has a layer of skin over cartilage. A cut or bruise to the ear can separate the skin from the cartilage underneath. This can cause problems with healing if blood gathers between the skin and the cartilage. Permanent damage to the ear may result if the excess blood is not drained within 1 to 2 days. Stitches, tape, or tissue glue may be used to close a cut. A pressure bandage may be used to keep blood from forming under the injured skin. If there is a lot of blood present (hematoma), a needle aspiration may be needed to remove it. You must have the ear checked within 1 to 2 days or as directed if you have had this type of injury. This is see if the blood has accumulated again. Call your caregiver for a follow-up exam as recommended.  SEEK IMMEDIATE MEDICAL CARE IF:  You develop severe pain.  You develop a fever or pus like drainage.  You have increased hearing loss or other problems. MAKE SURE YOU:   Understand these instructions.  Will watch your condition.  Will get help right away if you are not doing well or get worse. Document Released: 06/26/2005 Document Revised: 09/18/2011 Document Reviewed: 12/13/2006 [facility] Patient Information 2015 [place], [place]. This information is not intended to replace advice given to you by your health care provider. Make sure you discuss any questions you have with your health care provider.

## 2014-08-14 NOTE — ED Provider Notes (Signed)
CSN: [id number]     Arrival date & time 08/14/14  1531 History  This chart was scribed for non-physician practitioner Lorre Munroe, PA-C working with Richarda Blade, MD by Hilda Lias, ED Scribe. This patient was seen in room WTR6/WTR6 and the patient's care was started at 3:47 PM.    Chief Complaint  Patient presents with  . Ear Laceration   The history is provided by the patient. No language interpreter was used.     HPI Comments: Curtis Townsend is a 39 y.o. male who presents to the Emergency Department complaining of a right ear laceration that occurred earlier today after pt cut his ear on a piece of metal. Pt denies LOC, but states he felt a little lightheaded after he hit his head on the metal. Pt states he believes his last tetanus shot is UTD.      Past Medical History  Diagnosis Date  . Bipolar 1 disorder   . Hyperlipemia     no current med.  . Seasonal allergies   . History of kidney stones   . Wears partial dentures     upper  . Lipoma of back 05/2012    right  . Depression   . History of chicken pox   . Alcoholism   . Diabetes   . Depression   . Anxiety   . IBS (irritable bowel syndrome)    Past Surgical History  Procedure Laterality Date  . Lipoma excision  06/10/2012    Procedure: EXCISION LIPOMA;  Surgeon: Ralene Ok, MD;  Location: [facility];  Service: General;  Laterality: Right;  excision of back lipoma on right 3x3cm   Family History  Problem Relation Age of Onset  . Stroke Mother   . Hypertension Mother   . Diabetes Mother   . Hypertension Father   . Diabetes Father   . Cancer Paternal Uncle     Prostate Cancer   History  Substance Use Topics  . Smoking status: Current Every Day Smoker -- 0.25 packs/day for 16 years    Types: Cigarettes  . Smokeless tobacco: Never Used     Comment: 1-2 cig./month  . Alcohol Use: No     Comment: pt states daily vodka "till bottle empty"    Review of Systems  Skin: Positive for  wound.  Neurological: Positive for light-headedness.      Allergies  Review of patient's allergies indicates no known allergies.  Home Medications   Prior to Admission medications   Medication Sig Start Date End Date Taking? Authorizing Provider  benztropine (COGENTIN) 1 MG tablet Take 1 tablet (1 mg total) by mouth daily. 05/25/14 05/25/15  [name], MD  divalproex (DEPAKOTE ER) 500 MG 24 hr tablet Take 2 tablets (1,000 mg total) by mouth daily. 07/27/14 07/27/15  [name], MD  FLUoxetine (PROZAC) 40 MG capsule Take 1 capsule (40 mg total) by mouth daily. 07/27/14   [name], MD  risperiDONE (RISPERDAL) 1 MG tablet Take 1 tablet (1 mg total) by mouth at bedtime. 07/27/14 07/27/15  [name], MD  terbinafine (LAMISIL) 250 MG tablet Take 250 mg by mouth daily. 06/25/14   Historical Provider, MD   BP 124/74 mmHg  Pulse 90  Temp(Src) 97.8 F (36.6 C) (Oral)  Resp 16  SpO2 99% Physical Exam  Constitutional: He is oriented to person, place, and time. He appears well-developed and well-nourished.  HENT:  Head: Normocephalic and atraumatic.  1 cm laceration to  the right antitragus, with involvement of subcutaneous cartilage  Eyes: Conjunctivae and EOM are normal.  Neck: Normal range of motion.  Cardiovascular: Normal rate.   Pulmonary/Chest: Effort normal.  Abdominal: He exhibits no distension.  Musculoskeletal: Normal range of motion.  Neurological: He is alert and oriented to person, place, and time.  Skin: Skin is dry.  Psychiatric: He has a normal mood and affect. His behavior is normal. Judgment and thought content normal.  Nursing note and vitals reviewed.   ED Course  Procedures (including critical care time)  DIAGNOSTIC STUDIES: Oxygen Saturation is 99% on RA, normal by my interpretation.    COORDINATION OF CARE: 3:52 PM Discussed treatment plan with pt at bedside and pt agreed to plan.   Labs Review Labs Reviewed - No data to display  Imaging  Review No results found. LACERATION REPAIR Performed by: Montine Circle Authorized by: Montine Circle Consent: Verbal consent obtained. Risks and benefits: risks, benefits and alternatives were discussed Consent given by: patient Patient identity confirmed: provided demographic data Prepped and Draped in normal sterile fashion Wound explored  Laceration Location: right antitragus  Laceration Length: 1 cm  No Foreign Bodies seen or palpated  Anesthesia: local infiltration  Local anesthetic: lidocaine 1% without epinephrine  Anesthetic total: 1 ml  Irrigation method: syringe Amount of cleaning: standard  Skin closure: 5-0 vicryl rapide  Number of sutures: 5  Technique: interrupted  Patient tolerance: Patient tolerated the procedure well with no immediate complications.   EKG Interpretation None      MDM   Final diagnoses:  Ear lobe laceration, right, initial encounter    Patient with ear lobe laceration.  Repaired and copiously irrigated in the ED.  Seen by and discussed with Dr. Eulis Foster.  Recommend wound recheck in 2 days given risk of infection.  Tdap is up to date.  Patient understands and agrees with the plan.  I personally performed the services described in this documentation, which was scribed in my presence. The recorded information has been reviewed and is accurate.     Montine Circle, PA-C 08/14/14 1639  Richarda Blade, MD 08/15/14 [phone]

## 2014-08-14 NOTE — ED Notes (Addendum)
Pt hit head and cut ear on piece of metal this afternoon. 1 cm laceration noted to R ear, no bleeding at present time. Pt states he felt a little lightheaded after hitting head. No LOC.

## 2014-08-16 ENCOUNTER — Emergency Department (HOSPITAL_COMMUNITY)
Admission: EM | Admit: 2014-08-16 | Discharge: 2014-08-16 | Disposition: A | Discharge status: Home / Self Care | Payer: Federal, State, Local not specified - PPO | Attending: Emergency Medicine | Admitting: Emergency Medicine

## 2014-08-16 ENCOUNTER — Encounter (HOSPITAL_COMMUNITY): Payer: Self-pay | Admitting: *Deleted

## 2014-08-16 DIAGNOSIS — Z4801 Encounter for change or removal of surgical wound dressing: Secondary | ICD-10-CM | POA: Diagnosis not present

## 2014-08-16 DIAGNOSIS — E119 Type 2 diabetes mellitus without complications: Secondary | ICD-10-CM | POA: Insufficient documentation

## 2014-08-16 DIAGNOSIS — Z8619 Personal history of other infectious and parasitic diseases: Secondary | ICD-10-CM | POA: Insufficient documentation

## 2014-08-16 DIAGNOSIS — Z86018 Personal history of other benign neoplasm: Secondary | ICD-10-CM | POA: Insufficient documentation

## 2014-08-16 DIAGNOSIS — Z720 Tobacco use: Secondary | ICD-10-CM | POA: Diagnosis not present

## 2014-08-16 DIAGNOSIS — F419 Anxiety disorder, unspecified: Secondary | ICD-10-CM | POA: Diagnosis not present

## 2014-08-16 DIAGNOSIS — Z79899 Other long term (current) drug therapy: Secondary | ICD-10-CM | POA: Insufficient documentation

## 2014-08-16 DIAGNOSIS — Z5189 Encounter for other specified aftercare: Secondary | ICD-10-CM

## 2014-08-16 DIAGNOSIS — F319 Bipolar disorder, unspecified: Secondary | ICD-10-CM | POA: Insufficient documentation

## 2014-08-16 DIAGNOSIS — Z8709 Personal history of other diseases of the respiratory system: Secondary | ICD-10-CM | POA: Insufficient documentation

## 2014-08-16 DIAGNOSIS — Z8719 Personal history of other diseases of the digestive system: Secondary | ICD-10-CM | POA: Insufficient documentation

## 2014-08-16 DIAGNOSIS — Z87442 Personal history of urinary calculi: Secondary | ICD-10-CM | POA: Diagnosis not present

## 2014-08-16 NOTE — ED Notes (Signed)
Pt was seen and treated in ED on 2/5, had stitches place in right ear. Told to come back today for check up. Denies pain. No swelling or redness noted.

## 2014-08-16 NOTE — Discharge Instructions (Signed)
Please follow the directions provided. Be sure to follow-up with your primary care doctor if there are any concerns about your wound. Continue to keep the wound clean and dry and monitor for signs of infection which include redness, pain, swelling, tenderness or drainage. Don't hesitate to return to the emergency department if you have any new, worsening, or concerning symptoms.   SEEK IMMEDIATE MEDICAL CARE IF:  Your wound becomes red, swollen, hot, or tender.  You have increasing pain in the wound.  You have a red streak that extends from the wound.  There is pus coming from the wound.  You have a fever.  You have shaking chills.  There is a bad smell coming from the wound.  You have persistent bleeding from the wound

## 2014-08-16 NOTE — ED Provider Notes (Signed)
CSN: [id number]     Arrival date & time 08/16/14  1430 History  This chart was scribed for a non-physician practitioner, Britt Bottom, NP working with Malvin Johns, MD by Martinique Peace, ED Scribe. The patient was seen in WTR8/WTR8. The patient's care was started at 3:44 PM.    Chief Complaint  Patient presents with  . eval of stitches       The history is provided by the patient. No language interpreter was used.   HPI Comments: Curtis Townsend is a 39 y.o. male who presents to the Emergency Department seeking re-evaluation of sutures application on 10/13/6597. Pt had sutures applied to right ear due to laceration he sustained while working on his truck. Pt was told to come back today for check up. No complaints of pain. No swelling or redness noted. Pt is current everyday smoker.    Past Medical History  Diagnosis Date  . Bipolar 1 disorder   . Hyperlipemia     no current med.  . Seasonal allergies   . History of kidney stones   . Wears partial dentures     upper  . Lipoma of back 05/2012    right  . Depression   . History of chicken pox   . Alcoholism   . Diabetes   . Depression   . Anxiety   . IBS (irritable bowel syndrome)    Past Surgical History  Procedure Laterality Date  . Lipoma excision  06/10/2012    Procedure: EXCISION LIPOMA;  Surgeon: Ralene Ok, MD;  Location: [facility];  Service: General;  Laterality: Right;  excision of back lipoma on right 3x3cm   Family History  Problem Relation Age of Onset  . Stroke Mother   . Hypertension Mother   . Diabetes Mother   . Hypertension Father   . Diabetes Father   . Cancer Paternal Uncle     Prostate Cancer   History  Substance Use Topics  . Smoking status: Current Every Day Smoker -- 0.25 packs/day for 16 years    Types: Cigarettes  . Smokeless tobacco: Never Used     Comment: 1-2 cig./month  . Alcohol Use: No     Comment: pt states daily vodka "till bottle empty"    Review of  Systems  Constitutional: Negative for fever.  HENT: Negative for ear discharge and ear pain.        Dissolvable sutures applied to right ear, well healing. No redness, drainage, or swelling.   Gastrointestinal: Negative for nausea, vomiting and diarrhea.      Allergies  Review of patient's allergies indicates no known allergies.  Home Medications   Prior to Admission medications   Medication Sig Start Date End Date Taking? Authorizing Provider  benztropine (COGENTIN) 1 MG tablet Take 1 tablet (1 mg total) by mouth daily. 05/25/14 05/25/15  [name], MD  divalproex (DEPAKOTE ER) 500 MG 24 hr tablet Take 2 tablets (1,000 mg total) by mouth daily. 07/27/14 07/27/15  [name], MD  FLUoxetine (PROZAC) 40 MG capsule Take 1 capsule (40 mg total) by mouth daily. 07/27/14   [name], MD  risperiDONE (RISPERDAL) 1 MG tablet Take 1 tablet (1 mg total) by mouth at bedtime. 07/27/14 07/27/15  [name], MD  terbinafine (LAMISIL) 250 MG tablet Take 250 mg by mouth daily. 06/25/14   Historical Provider, MD   BP 118/68 mmHg  Pulse 91  Temp(Src) 98.5 F (36.9 C) (Oral)  Resp 16  SpO2 99% Physical Exam  Constitutional: He appears well-developed and well-nourished. No distress.  HENT:  Head: Normocephalic and atraumatic.  Ears:     Eyes: Conjunctivae are normal.  Neck: Neck supple.  Cardiovascular: Normal rate.   Pulmonary/Chest: Effort normal. No respiratory distress.  Neurological: He is alert.  Skin: Skin is warm and dry.  Psychiatric: He has a normal mood and affect.  Nursing note and vitals reviewed.   ED Course  Procedures (including critical care time) Labs Review Labs Reviewed - No data to display  Imaging Review No results found.   EKG Interpretation None     Medications - No data to display  3:46 PM- Treatment plan was discussed with patient who verbalizes understanding and agrees.   MDM   Final diagnoses:  Encounter for wound re-check   39 yo  with encounter for re-check of disolveable sutures and laceration repair.  Sutures in place with no redness, swelling or drainage. Discussed continued wound care and return precautions provided. Pt aware of plan and in agreement.     I personally performed the services described in this documentation, which was scribed in my presence. The recorded information has been reviewed and is accurate.  Filed Vitals:   08/16/14 1511 08/16/14 1557  BP: 118/68 111/72  Pulse: 91 87  Temp: 98.5 F (36.9 C)   TempSrc: Oral   Resp: 16 18  SpO2: 99% 100%      Britt Bottom, NP 08/17/14 [place], MD 08/26/14 [phone]

## 2014-08-21 ENCOUNTER — Other Ambulatory Visit (HOSPITAL_COMMUNITY): Payer: Self-pay | Admitting: Psychiatry

## 2014-08-24 NOTE — Telephone Encounter (Signed)
Refill denied as a new order was already e-scribed 07/27/14 at 11:28am.

## 2014-09-04 ENCOUNTER — Ambulatory Visit (INDEPENDENT_AMBULATORY_CARE_PROVIDER_SITE_OTHER): Payer: Federal, State, Local not specified - PPO | Admitting: Psychiatry

## 2014-09-04 DIAGNOSIS — F3163 Bipolar disorder, current episode mixed, severe, without psychotic features: Secondary | ICD-10-CM

## 2014-09-07 ENCOUNTER — Ambulatory Visit: Payer: Medicare Other | Admitting: Internal Medicine
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
## 2014-09-10 ENCOUNTER — Ambulatory Visit (INDEPENDENT_AMBULATORY_CARE_PROVIDER_SITE_OTHER): Payer: Federal, State, Local not specified - PPO | Admitting: Psychiatry

## 2014-09-10 DIAGNOSIS — F3163 Bipolar disorder, current episode mixed, severe, without psychotic features: Secondary | ICD-10-CM | POA: Diagnosis not present

## 2014-09-11 NOTE — Progress Notes (Signed)
   THERAPIST PROGRESS NOTE  Session Time: 3:00-4:00  Participation Level: Active  Behavioral Response: CasualAlertSad  Type of Therapy: Individual Therapy  Treatment Goals addressed: Anger/Irritability Coping  Interventions: CBT  Summary: Curtis Townsend is a 39 y.o. male who presents with Bipolar Disorder.   Suicidal/Homicidal: Nowithout intent/plan  Therapist Response: Pt. Has significantly different presentation as compared to last session. Pt. Reports that he went to his probation officer this morning and she would not permit trip out of state to visit his father at the end of the month. Pt. Reports that the trip is necessary for him to make peace with his father and belief that it will allow further emotional healing. Processed that officer indicated that the trip will be allowed after 60 successful days of probation and that this will be a temporary postponement of the trip. Pt. Continues to exhibit rigid attitude towards behavioral options. Pt. Reported that he would not visit in the summer or late fall because of the heat in [place]. Significant time was spent discussing lack of social interaction. Pt. Reported that he is happy with having his wife and mother and children as his social circle. Pt. Named several high profile celebrities ex. Roe Rutherford, Merry Proud, [place] who functioned in social isolation. Therapist pointed out that these individuals were likely not models for mental health but often struggled with bouts of paranoia and addiction. Pt. Discussed that he felt more safe alone and less vulnerable to situations that might get him in trouble. This thought pattern was normalized as part of pt.'s trauma history and suggested that we can work slowly to build trust for others by resisting the urge to engage in fortune telling by not assuming what might happen in the future without objective evidence for support.  Plan: Continue with CBT based therapy. Return again in 2-3  weeks.  Diagnosis:Axis I: Bipolar, mixed  Axis II: No diagnosis   Nancie Neas, [facility] 09/11/2014

## 2014-09-15 ENCOUNTER — Ambulatory Visit (HOSPITAL_COMMUNITY): Payer: Self-pay | Admitting: Psychiatry

## 2014-09-21 ENCOUNTER — Other Ambulatory Visit (HOSPITAL_COMMUNITY): Payer: Self-pay | Admitting: Psychiatry

## 2014-09-21 DIAGNOSIS — F3163 Bipolar disorder, current episode mixed, severe, without psychotic features: Secondary | ICD-10-CM

## 2014-09-21 NOTE — Telephone Encounter (Signed)
Patient to return 10/26/14 and was last seen 07/27/14 at which time his Cogentin was continued.  Patient did not receive a new order that date so one time refill authorized until patient returns for evaluation with [name] on 10/26/14.

## 2014-09-22 ENCOUNTER — Ambulatory Visit (HOSPITAL_COMMUNITY): Payer: Self-pay | Admitting: Psychiatry

## 2014-09-29 ENCOUNTER — Ambulatory Visit (HOSPITAL_COMMUNITY): Payer: Self-pay | Admitting: Psychiatry

## 2014-10-01 DIAGNOSIS — Z83511 Family history of glaucoma: Secondary | ICD-10-CM | POA: Diagnosis not present

## 2014-10-01 DIAGNOSIS — H40013 Open angle with borderline findings, low risk, bilateral: Secondary | ICD-10-CM | POA: Diagnosis not present

## 2014-10-01 DIAGNOSIS — H40053 Ocular hypertension, bilateral: Secondary | ICD-10-CM | POA: Diagnosis not present

## 2014-10-06 ENCOUNTER — Ambulatory Visit (HOSPITAL_COMMUNITY): Payer: Self-pay | Admitting: Psychiatry

## 2014-10-13 ENCOUNTER — Ambulatory Visit (HOSPITAL_COMMUNITY): Payer: Self-pay | Admitting: Psychiatry

## 2014-10-20 ENCOUNTER — Ambulatory Visit (HOSPITAL_COMMUNITY): Payer: Self-pay | Admitting: Psychiatry

## 2014-10-26 ENCOUNTER — Ambulatory Visit (HOSPITAL_COMMUNITY): Payer: Self-pay | Admitting: Psychiatry

## 2014-10-27 ENCOUNTER — Ambulatory Visit (HOSPITAL_COMMUNITY): Payer: Self-pay | Admitting: Psychiatry

## 2014-10-30 ENCOUNTER — Ambulatory Visit (INDEPENDENT_AMBULATORY_CARE_PROVIDER_SITE_OTHER): Payer: Federal, State, Local not specified - PPO | Admitting: Psychiatry

## 2014-10-30 ENCOUNTER — Encounter (HOSPITAL_COMMUNITY): Payer: Self-pay | Admitting: Psychiatry

## 2014-10-30 VITALS — BP 135/88 | HR 96 | Ht 73.0 in | Wt 206.4 lb

## 2014-10-30 DIAGNOSIS — F3163 Bipolar disorder, current episode mixed, severe, without psychotic features: Secondary | ICD-10-CM | POA: Diagnosis not present

## 2014-10-30 DIAGNOSIS — F1210 Cannabis abuse, uncomplicated: Secondary | ICD-10-CM | POA: Diagnosis not present

## 2014-10-30 DIAGNOSIS — F4310 Post-traumatic stress disorder, unspecified: Secondary | ICD-10-CM

## 2014-10-30 MED ORDER — FLUOXETINE HCL 40 MG PO CAPS: 40.0000 mg | ORAL_CAPSULE | Freq: Every day | ORAL | Status: DC

## 2014-10-30 MED ORDER — BENZTROPINE MESYLATE 1 MG PO TABS: ORAL_TABLET | ORAL | Status: DC

## 2014-10-30 MED ORDER — RISPERIDONE 1 MG PO TABS: 1.0000 mg | ORAL_TABLET | Freq: Every day | ORAL | Status: DC
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
## 2014-11-03 ENCOUNTER — Ambulatory Visit (HOSPITAL_COMMUNITY): Payer: Self-pay | Admitting: Psychiatry

## 2014-11-11 ENCOUNTER — Ambulatory Visit (INDEPENDENT_AMBULATORY_CARE_PROVIDER_SITE_OTHER): Payer: Federal, State, Local not specified - PPO | Admitting: Psychiatry

## 2014-11-11 DIAGNOSIS — F3163 Bipolar disorder, current episode mixed, severe, without psychotic features: Secondary | ICD-10-CM

## 2014-11-11 NOTE — Progress Notes (Signed)
   THERAPIST PROGRESS NOTE  Session Time: 1:40-2:35  Participation Level: Active  Behavioral Response: CasualAlertEuthymic  Type of Therapy: Individual Therapy  Treatment Goals addressed: Anger/Irritability Coping  Interventions: CBT  Summary: Curtis Townsend is a 39 y.o. male who presents with Bipolar Disorder.   Suicidal/Homicidal: Nowithout intent/plan  Therapist Response: Pt. Presented with euthymic mood, reported mild anxiety. Pt. Was talkative, made appropriate eye contact. Pt. Reported that wife is expecting third child. Pt. Attributed anxiety to wife and two young children went out of town to attend relative's graduation. Pt. Reports that trip caused financial strain and he was not able to go on the trip because of probation. Pt. Reports that he is developing positive relationship with probation officer and expects that he will earn privileges to go out of state because of consistent negative drug screen and general compliance with probation program. Pt. Reports plans to attend relative's funeral tomorrow with his mother. Pt. Reports that he does not want to go to the funeral because he was not close to his deceased aunt, but is going out of sense of obligation to his mother and remaining family. Pt. Attributes reluctance to go to family tradition of excessive drinking after funerals. Session focused on Pt. Strengths of public speaking and desire to help others. Pt. Reported "I don't do groups of people". Pt. Considered accessing mental health association and long term goal of peer group facilitation to use strengths and develop skill of connecting to others through altruism.   Plan: Continue with CBT based therapy. Return again in 1 week.  Diagnosis:Axis I: Bipolar, mixed  Axis II: No diagnosis   Nancie Neas, [facility] 11/11/2014

## 2014-11-18 ENCOUNTER — Ambulatory Visit (HOSPITAL_COMMUNITY): Payer: Self-pay | Admitting: Psychiatry

## 2014-11-25 ENCOUNTER — Ambulatory Visit (INDEPENDENT_AMBULATORY_CARE_PROVIDER_SITE_OTHER): Payer: Medicare Other | Admitting: Psychiatry

## 2014-11-25 DIAGNOSIS — F3160 Bipolar disorder, current episode mixed, unspecified: Secondary | ICD-10-CM | POA: Diagnosis not present

## 2014-11-25 DIAGNOSIS — F3163 Bipolar disorder, current episode mixed, severe, without psychotic features: Secondary | ICD-10-CM

## 2014-11-27 NOTE — Psych (Signed)
   THERAPIST PROGRESS NOTE  Session Time: 1:40-2:35  Participation Level: Active  Behavioral Response: CasualAlertEuthymic  Type of Therapy: Individual Therapy  Treatment Goals addressed: Anger/Irritability Coping  Interventions: CBT  Summary: Curtis Townsend is a 39 y.o. male who presents with Bipolar Disorder.   Suicidal/Homicidal: Nowithout intent/plan  Therapist Response: Pt. Continues to present with euthymic mood. Pt. Reports that his family returned from trip safely, which calmed his anxiety. Pt. Reports that he attended family funeral with his mother and several family members approached him about drinking which he was able to decline. Pt. Reported that he had to return to [place] the following week for another funeral. Pt. Reports that his wife observed family pattern of alcohol abuse and was an opportunity to reflect on his decision to decide not to drink anymore. Pt. Reported that he is excited about new baby, hopeful for a boy. Pt. Reports that he has noticed that anger has decreased and uses breathing exercises regularly to process his anger and exercise attitude of acceptance for current moment. Pt. Continues to process infidelity in marriage, but acknowledges that he has made progress in forgiveness of self. Pt. Finds emotional support and comfort in relationship with wife and demonstration of spirituality, desire to be positive model for young men and provide mentorship.   Plan: Continue with CBT based therapy. Return again in 1 week.  Diagnosis:Axis I: Bipolar, mixed  Axis II: No diagnosis    Nancie Neas, [facility] 11/27/2014

## 2014-11-30 ENCOUNTER — Other Ambulatory Visit (HOSPITAL_COMMUNITY): Payer: Self-pay | Admitting: Psychiatry

## 2014-11-30 NOTE — Telephone Encounter (Signed)
Patient was given a one time order of Cogentin at last evaluation and is in need of enough refills to last until her next appointment 02/01/15.

## 2014-12-02 ENCOUNTER — Ambulatory Visit (HOSPITAL_COMMUNITY): Payer: Self-pay | Admitting: Psychiatry

## 2014-12-09 ENCOUNTER — Ambulatory Visit (INDEPENDENT_AMBULATORY_CARE_PROVIDER_SITE_OTHER): Payer: Medicare Other | Admitting: Psychiatry

## 2014-12-09 DIAGNOSIS — F3163 Bipolar disorder, current episode mixed, severe, without psychotic features: Secondary | ICD-10-CM | POA: Diagnosis not present

## 2014-12-09 NOTE — Progress Notes (Signed)
   THERAPIST PROGRESS NOTE  Session Time: 1:35-2:35  Participation Level: Active  Behavioral Response: CasualAlertdysphoric  Type of Therapy: Individual Therapy  Treatment Goals addressed: Anger/Irritability Coping  Interventions: CBT  Summary: Curtis Townsend is a 39 y.o. male who presents with Bipolar Disorder.   Suicidal/Homicidal: Nowithout intent/plan  Therapist Response: Pt. Presents with dysphoric mood. Pt. Reports that he has been under considerable financial stress, expecting an new baby. Pt. Reports that his most significant stressor is his mother who has had a stroke and untreated depression. Pt. Discussed fears of having to put her in a nursing home. Pt. Reported that he is able to focus on his breathing when he starts to feel overwhelmed by stress. Pt. Reports that he finds hope in the love that he has for his children and desire to educate others about history. Pt. Continues to process life experiences that have developed expectations for pain and suffering.   Plan: Continue with CBT based therapy. Return again in 1 week.  Diagnosis:Axis I: Bipolar, mixed  Axis II: No diagnosis   Nancie Neas, [facility] 12/09/2014

## 2014-12-16 ENCOUNTER — Ambulatory Visit (INDEPENDENT_AMBULATORY_CARE_PROVIDER_SITE_OTHER): Payer: Medicare Other | Admitting: Psychiatry

## 2014-12-16 DIAGNOSIS — F3163 Bipolar disorder, current episode mixed, severe, without psychotic features: Secondary | ICD-10-CM | POA: Diagnosis not present
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
## 2014-12-23 ENCOUNTER — Ambulatory Visit (INDEPENDENT_AMBULATORY_CARE_PROVIDER_SITE_OTHER): Payer: Medicare Other | Admitting: Psychiatry

## 2014-12-23 VITALS — BP 128/82 | HR 88 | Ht 73.0 in | Wt 203.4 lb

## 2014-12-23 DIAGNOSIS — F3163 Bipolar disorder, current episode mixed, severe, without psychotic features: Secondary | ICD-10-CM
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
## 2014-12-30 ENCOUNTER — Ambulatory Visit (INDEPENDENT_AMBULATORY_CARE_PROVIDER_SITE_OTHER): Payer: Medicare Other | Admitting: Psychiatry

## 2014-12-30 DIAGNOSIS — F3163 Bipolar disorder, current episode mixed, severe, without psychotic features: Secondary | ICD-10-CM

## 2014-12-31 NOTE — Progress Notes (Signed)
   THERAPIST PROGRESS NOTE  Session Time: 1:35-2:35  Participation Level: Active  Behavioral Response: CasualAlertEuthymic  Type of Therapy: Individual Therapy  Treatment Goals addressed: Anger/Irritability/Coping  Interventions: CBT  Summary: Curtis Townsend is a 39 y.o. male who presents with Bipolar Disorder.   Suicidal/Homicidal: Nowithout intent/plan  Therapist Response: Pt. Continues to present with some stress related anxiety. Pt. Discussed long-term plans to move back to [place] with his family. Pt discussed tension in relationship with his father, discussed details of recent conversation with his father who compared him to his brother. Pt. Took offense to the comparison because father was focused on material wealth and not personal/spiritual growth. Pt. Reports ongoing progress of self-forgiveness. Pt. 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   Plan: Continue with CBT based therapy. Return again in 1 week.  Diagnosis:Axis I: Bipolar, mixed  Axis II: No diagnosis    Nancie Neas, [facility] 12/31/2014

## 2015-01-04 ENCOUNTER — Encounter: Payer: Self-pay | Admitting: Internal Medicine

## 2015-01-06 ENCOUNTER — Encounter: Payer: Federal, State, Local not specified - PPO | Admitting: Internal Medicine

## 2015-01-06 ENCOUNTER — Ambulatory Visit (HOSPITAL_COMMUNITY): Payer: Self-pay | Admitting: Psychiatry

## 2015-01-13 ENCOUNTER — Ambulatory Visit (INDEPENDENT_AMBULATORY_CARE_PROVIDER_SITE_OTHER): Payer: Federal, State, Local not specified - PPO | Admitting: Psychiatry

## 2015-01-13 DIAGNOSIS — F3163 Bipolar disorder, current episode mixed, severe, without psychotic features: Secondary | ICD-10-CM | POA: Diagnosis not present
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
## 2015-01-19 ENCOUNTER — Telehealth (HOSPITAL_COMMUNITY): Payer: Self-pay | Admitting: Psychiatry

## 2015-01-20 ENCOUNTER — Ambulatory Visit (HOSPITAL_COMMUNITY): Payer: Self-pay | Admitting: Psychiatry

## 2015-01-21 ENCOUNTER — Encounter: Payer: Federal, State, Local not specified - PPO | Admitting: Internal Medicine

## 2015-01-21 DIAGNOSIS — Z0289 Encounter for other administrative examinations: Secondary | ICD-10-CM

## 2015-01-27 ENCOUNTER — Encounter: Payer: Self-pay | Admitting: Internal Medicine

## 2015-01-27 ENCOUNTER — Ambulatory Visit (INDEPENDENT_AMBULATORY_CARE_PROVIDER_SITE_OTHER): Payer: Federal, State, Local not specified - PPO | Admitting: Internal Medicine

## 2015-01-27 ENCOUNTER — Other Ambulatory Visit (INDEPENDENT_AMBULATORY_CARE_PROVIDER_SITE_OTHER): Payer: Federal, State, Local not specified - PPO

## 2015-01-27 VITALS — BP 104/64 | HR 80 | Temp 98.7°F | Resp 16 | Ht 73.0 in | Wt 203.0 lb

## 2015-01-27 DIAGNOSIS — Z0000 Encounter for general adult medical examination without abnormal findings: Secondary | ICD-10-CM

## 2015-01-27 DIAGNOSIS — E785 Hyperlipidemia, unspecified: Secondary | ICD-10-CM

## 2015-01-27 LAB — CBC WITH DIFFERENTIAL/PLATELET
Basophils Absolute: 0 10*3/uL (ref 0.0–0.1)
Basophils Relative: 0.6 % (ref 0.0–3.0)
EOS ABS: 0.1 10*3/uL (ref 0.0–0.7)
EOS PCT: 1.3 % (ref 0.0–5.0)
HCT: 49 % (ref 39.0–52.0)
HEMOGLOBIN: 16.6 g/dL (ref 13.0–17.0)
LYMPHS PCT: 45.9 % (ref 12.0–46.0)
Lymphs Abs: 2.4 10*3/uL (ref 0.7–4.0)
MCHC: 33.8 g/dL (ref 30.0–36.0)
MCV: 92.3 fl (ref 78.0–100.0)
MONO ABS: 0.5 10*3/uL (ref 0.1–1.0)
Monocytes Relative: 8.8 % (ref 3.0–12.0)
Neutro Abs: 2.3 10*3/uL (ref 1.4–7.7)
Neutrophils Relative %: 43.4 % (ref 43.0–77.0)
PLATELETS: 186 10*3/uL (ref 150.0–400.0)
RBC: 5.3 Mil/uL (ref 4.22–5.81)
RDW: 12.7 % (ref 11.5–15.5)
WBC: 5.3 10*3/uL (ref 4.0–10.5)

## 2015-01-27 LAB — COMPREHENSIVE METABOLIC PANEL
ALK PHOS: 76 U/L (ref 39–117)
ALT: 13 U/L (ref 0–53)
AST: 17 U/L (ref 0–37)
Albumin: 4.1 g/dL (ref 3.5–5.2)
BUN: 10 mg/dL (ref 6–23)
CALCIUM: 9.6 mg/dL (ref 8.4–10.5)
CO2: 29 meq/L (ref 19–32)
Chloride: 106 mEq/L (ref 96–112)
Creatinine, Ser: 1.19 mg/dL (ref 0.40–1.50)
GFR: 87.48 mL/min (ref >60.00)
GLUCOSE: 94 mg/dL (ref 70–99)
Potassium: 3.9 mEq/L (ref 3.5–5.1)
SODIUM: 142 meq/L (ref 135–145)
Total Bilirubin: 0.9 mg/dL (ref 0.2–1.2)
Total Protein: 7.2 g/dL (ref 6.0–8.3)

## 2015-01-27 LAB — LIPID PANEL
CHOL/HDL RATIO: 5
CHOLESTEROL: 227 mg/dL — AB (ref 0–200)
HDL: 44.2 mg/dL (ref >39.00)
LDL Cholesterol: 159 mg/dL — ABNORMAL HIGH (ref 0–99)
NonHDL: 182.8
Triglycerides: 121 mg/dL (ref 0.0–149.0)
VLDL: 24.2 mg/dL (ref 0.0–40.0)

## 2015-01-27 LAB — TSH: TSH: 0.46 u[IU]/mL (ref 0.35–4.50)

## 2015-01-27 NOTE — Progress Notes (Signed)
Subjective:  Patient ID: Curtis Townsend, male    DOB: 02-25-1976  Age: 39 y.o. MRN: [id number]  CC: Annual Exam   HPI Curtis Townsend presents for a CPX - he feels well and offers no complaints.  Outpatient Prescriptions Prior to Visit  Medication Sig Dispense Refill  . benztropine (COGENTIN) 1 MG tablet TAKE 1 TABLET BY MOUTH DAILY. 30 tablet 1  . divalproex (DEPAKOTE ER) 500 MG 24 hr tablet Take 2 tablets (1,000 mg total) by mouth daily. 69 tablet 2  . FLUoxetine (PROZAC) 40 MG capsule Take 1 capsule (40 mg total) by mouth daily. 30 capsule 2  . risperiDONE (RISPERDAL) 1 MG tablet Take 1 tablet (1 mg total) by mouth at bedtime. 30 tablet 2  . terbinafine (LAMISIL) 250 MG tablet Take 250 mg by mouth daily.  0   No facility-administered medications prior to visit.    ROS Review of Systems  All other systems reviewed and are negative.   Objective:  BP 104/64 mmHg  Pulse 80  Temp(Src) 98.7 F (37.1 C) (Oral)  Resp 16  Ht 6\' 1"  (1.854 m)  Wt 203 lb (92.08 kg)  BMI 26.79 kg/m2  SpO2 98%  BP Readings from Last 3 Encounters:  01/27/15 104/64  12/23/14 128/82  10/30/14 135/88    Wt Readings from Last 3 Encounters:  01/27/15 203 lb (92.08 kg)  12/23/14 203 lb 6.4 oz (92.262 kg)  10/30/14 206 lb 6.4 oz (93.622 kg)    Physical Exam  Constitutional: He is oriented to person, place, and time. No distress.  HENT:  Mouth/Throat: Oropharynx is clear and moist. No oropharyngeal exudate.  Eyes: Conjunctivae are normal. Right eye exhibits no discharge. Left eye exhibits no discharge. No scleral icterus.  Neck: Normal range of motion. Neck supple. No JVD present. No tracheal deviation present. No thyromegaly present.  Cardiovascular: Normal rate, regular rhythm, normal heart sounds and intact distal pulses.  Exam reveals no gallop and no friction rub.   No murmur heard. Pulmonary/Chest: Effort normal and breath sounds normal. No stridor. No respiratory distress. He has no  wheezes. He has no rales. He exhibits no tenderness.  Abdominal: Soft. Bowel sounds are normal. He exhibits no distension and no mass. There is no tenderness. There is no rebound and no guarding. Hernia confirmed negative in the right inguinal area and confirmed negative in the left inguinal area.  Genitourinary: Testes normal and penis normal. Right testis shows no mass, no swelling and no tenderness. Right testis is descended. Left testis shows no mass, no swelling and no tenderness. Left testis is descended. Circumcised. No penile erythema or penile tenderness. No discharge found.  Musculoskeletal: Normal range of motion. He exhibits no edema or tenderness.  Lymphadenopathy:    He has no cervical adenopathy.       Right: No inguinal adenopathy present.       Left: No inguinal adenopathy present.  Neurological: He is oriented to person, place, and time.  Skin: Skin is warm and dry. No rash noted. He is not diaphoretic. No erythema. No pallor.  Psychiatric: He has a normal mood and affect. His behavior is normal. Judgment and thought content normal.  Vitals reviewed.   Lab Results  Component Value Date   WBC 5.3 01/27/2015   HGB 16.6 01/27/2015   HCT 49.0 01/27/2015   PLT 186.0 01/27/2015   GLUCOSE 94 01/27/2015   CHOL 227* 01/27/2015   TRIG 121.0 01/27/2015   HDL 44.20 01/27/2015  LDLCALC 159* 01/27/2015   ALT 13 01/27/2015   AST 17 01/27/2015   NA 142 01/27/2015   K 3.9 01/27/2015   CL 106 01/27/2015   CREATININE 1.19 01/27/2015   BUN 10 01/27/2015   CO2 29 01/27/2015   TSH 0.46 01/27/2015   HGBA1C 5.7* 06/22/2014    No results found.  Assessment & Plan:   Odyn was seen today for annual exam.  Diagnoses and all orders for this visit:  Routine general medical examination at a health care facility- exam done, labs reviewed, he has a mildly elevated LDL but I do not think it warrants treatment, vaccines were reviewed, he was given patient education  material. Orders: -     Lipid panel; Future -     Comprehensive metabolic panel; Future -     CBC with Differential/Platelet; Future -     TSH; Future   I have discontinued [name]'s terbinafine. I am also having him maintain his divalproex, risperiDONE, FLUoxetine, and benztropine.  No orders of the defined types were placed in this encounter.     Follow-up: Return if symptoms worsen or fail to improve.  Scarlette Calico, MD

## 2015-01-27 NOTE — Patient Instructions (Signed)

## 2015-01-27 NOTE — Progress Notes (Signed)
Pre visit review using our clinic review tool, if applicable. No additional management support is needed unless otherwise documented below in the visit note. 

## 2015-02-01 ENCOUNTER — Ambulatory Visit (HOSPITAL_COMMUNITY): Payer: Self-pay | Admitting: Psychiatry

## 2015-02-05 ENCOUNTER — Ambulatory Visit (INDEPENDENT_AMBULATORY_CARE_PROVIDER_SITE_OTHER): Payer: Federal, State, Local not specified - PPO | Admitting: Psychiatry

## 2015-02-05 ENCOUNTER — Encounter (HOSPITAL_COMMUNITY): Payer: Self-pay | Admitting: Psychiatry

## 2015-02-05 VITALS — BP 110/70 | HR 98 | Ht 73.0 in | Wt 201.0 lb

## 2015-02-05 DIAGNOSIS — F319 Bipolar disorder, unspecified: Secondary | ICD-10-CM

## 2015-02-05 DIAGNOSIS — F1221 Cannabis dependence, in remission: Secondary | ICD-10-CM | POA: Diagnosis not present

## 2015-02-05 DIAGNOSIS — F4310 Post-traumatic stress disorder, unspecified: Secondary | ICD-10-CM | POA: Diagnosis not present

## 2015-02-05 DIAGNOSIS — F3163 Bipolar disorder, current episode mixed, severe, without psychotic features: Secondary | ICD-10-CM

## 2015-02-05 MED ORDER — DIVALPROEX SODIUM ER 500 MG PO TB24: 1000.0000 mg | ORAL_TABLET | Freq: Every day | ORAL | Status: DC

## 2015-02-05 MED ORDER — RISPERIDONE 1 MG PO TABS: 1.0000 mg | ORAL_TABLET | Freq: Every day | ORAL | Status: DC

## 2015-02-05 MED ORDER — FLUOXETINE HCL 40 MG PO CAPS: 40.0000 mg | ORAL_CAPSULE | Freq: Every day | ORAL | Status: DC

## 2015-02-05 MED ORDER — BENZTROPINE MESYLATE 1 MG PO TABS: 1.0000 mg | ORAL_TABLET | Freq: Every day | ORAL | Status: DC
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
## 2015-02-25 DIAGNOSIS — H40013 Open angle with borderline findings, low risk, bilateral: Secondary | ICD-10-CM | POA: Diagnosis not present

## 2015-04-13 ENCOUNTER — Telehealth (HOSPITAL_COMMUNITY): Payer: Self-pay

## 2015-04-15 ENCOUNTER — Telehealth: Payer: Self-pay | Admitting: Family Medicine

## 2015-04-15 ENCOUNTER — Other Ambulatory Visit (INDEPENDENT_AMBULATORY_CARE_PROVIDER_SITE_OTHER): Payer: Medicare Other

## 2015-04-15 ENCOUNTER — Encounter: Payer: Self-pay | Admitting: Family Medicine

## 2015-04-15 ENCOUNTER — Ambulatory Visit (INDEPENDENT_AMBULATORY_CARE_PROVIDER_SITE_OTHER)
Admission: RE | Admit: 2015-04-15 | Discharge: 2015-04-15 | Disposition: A | Discharge status: Home / Self Care | Payer: Federal, State, Local not specified - PPO | Source: Ambulatory Visit | Attending: Family Medicine | Admitting: Family Medicine

## 2015-04-15 ENCOUNTER — Ambulatory Visit (INDEPENDENT_AMBULATORY_CARE_PROVIDER_SITE_OTHER): Payer: Federal, State, Local not specified - PPO | Admitting: Family Medicine

## 2015-04-15 VITALS — BP 106/70 | HR 95 | Temp 98.6°F | Ht 73.0 in | Wt 203.0 lb

## 2015-04-15 DIAGNOSIS — R319 Hematuria, unspecified: Secondary | ICD-10-CM

## 2015-04-15 LAB — POCT URINALYSIS DIPSTICK
Bilirubin, UA: 1
Blood, UA: 2
GLUCOSE UA: NEGATIVE
KETONES UA: 5
LEUKOCYTES UA: NEGATIVE
NITRITE UA: NEGATIVE
PH UA: 6
Protein, UA: 15
Spec Grav, UA: 1.025
Urobilinogen, UA: 0.2

## 2015-04-15 LAB — URINALYSIS, MICROSCOPIC ONLY

## 2015-04-15 MED ORDER — HYDROCODONE-ACETAMINOPHEN 10-325 MG PO TABS: 1.0000 | ORAL_TABLET | Freq: Three times a day (TID) | ORAL | Status: DC | PRN

## 2015-04-15 MED ORDER — TAMSULOSIN HCL 0.4 MG PO CAPS: 0.4000 mg | ORAL_CAPSULE | Freq: Every day | ORAL | Status: DC

## 2015-04-15 MED ORDER — HYDROCODONE-ACETAMINOPHEN 10-325 MG PO TABS: 0.5000 | ORAL_TABLET | Freq: Three times a day (TID) | ORAL | Status: DC | PRN

## 2015-04-15 NOTE — Patient Instructions (Addendum)
Nice to meet you. It appears that you have a kidney stone.  We will obtain a CT scan to evaluate for this stone or other causes.  We will start you on flomax to help the stone pass.  Vicodin for pain as prescribed.  We will send your urine for culture.  Please strain your urine to catch the stone.  If you develop fever, uncontrolled pain, nausea, vomiting, abdominal pain, blood in your urine, or back pain, numbness, weakness, or new symptoms please seek medical attention.

## 2015-04-15 NOTE — Telephone Encounter (Signed)
Attempted to call patient regarding CT scan. There was no answer so voicemail was left asking patient to call back to the office. Will await patients call back.   CT scan with possible stones in bilateral kidneys. No stones in ureters. No obstruction. It is possible that the patient has passed the stone that led to his blood in his urine. Also the potential stones could be causing his discomfort. He should continue to strain his urine. Can take the flomax to help expel the stones that could be present in kidneys. If not improved by early next week he should follow-up in clinic. He was given return precautions during the visit and should follow these to indicate when to seek medical attention.

## 2015-04-15 NOTE — Progress Notes (Signed)
Pre visit review using our clinic review tool, if applicable. No additional management support is needed unless otherwise documented below in the visit note. 

## 2015-04-15 NOTE — Telephone Encounter (Signed)
Patient called back. Spoke with patient and advised information in prior note. Advised of return precautions again.

## 2015-04-15 NOTE — Progress Notes (Signed)
[name], MD Phone: [phone]  Curtis Townsend is a 39 y.o. male who presents today for same day appointment.   Single episode of hematuria this morning. With mild right flank discomfort. Has had mild right flank discomfort for the past several days. No fevers. Mild dysuria with this. No urgency. No frequency. No abdominal pain.No penile discharge. No weakness, numbness, saddle anesthesia, incontinence, or history of cancer. Has history of kidney stone 4-5 years ago. Review of imaging from that time revealed a 3 mm obstructing stone with very mild right sided hydronephrosis. Notes he received flomax and pain medication for this and improved. Has not had any additional blood in his urine. Feels well.   PMH: smoker.   ROS see HPI  Objective  Physical Exam Filed Vitals:   04/15/15 1006  BP: 106/70  Pulse: 95  Temp: 98.6 F (37 C)    Physical Exam  Constitutional: He is well-developed, well-nourished, and in no distress.  Non toxic  HENT:  Head: Normocephalic and atraumatic.  Mouth/Throat: Oropharynx is clear and moist.  Eyes: Conjunctivae are normal. Pupils are equal, round, and reactive to light.  Cardiovascular: Normal rate, regular rhythm and normal heart sounds.  Exam reveals no gallop and no friction rub.   No murmur heard. Pulmonary/Chest: Breath sounds normal. No respiratory distress. He has no wheezes. He has no rales.  Abdominal: Soft. Bowel sounds are normal. He exhibits no distension. There is no tenderness. There is no rebound and no guarding.  Musculoskeletal:  No midline spine tenderness, mild left paraspinous muscle spasm and tenderness, minimal discomfort on CVA exam, no swelling or skin changes  Neurological: He is alert. Gait normal.  5/5 strength in bilateral quads, hamstrings, plantar and dorsiflexion, sensation to light touch intact in bilateral LE, normal gait, 2+ patellar reflexes  Skin: Skin is warm and dry. He is not diaphoretic.      Assessment/Plan: Please see individual problem list.  Hematuria Single episode of hematuria this morning with several days of right flank pain. UA with blood. History and UA findings consistent with kidney stone in patient with kidney stone history. No signs of infection on UA. Appears well and non-toxic. Benign abdominal exam. Neurologically intact with his back pain. Will treat with flomax and vicodin for pain. Initial prescription for vicodin 10-325 one tablet every 8 hours as needed. Noted this after patient left and called and spoke with patient advising him to take half a tablet every 8 hours as needed for pain and spoke with pharmacy to change instructions to vicodin 10-325 mg take half a tablet every 8 hours as needed for pain. They noted this and will change the prescription in the system for the patient. Will check CT renal stone protocol to evaluate for stone. Patient will strain urine. Will send urine for culture and micro. Given return precautions.     Orders Placed This Encounter  Procedures  . Urine Culture    Standing Status: Future     Number of Occurrences:      Standing Expiration Date: 04/14/2016  . CT RENAL STONE STUDY    Mary/LY X . BC federal and Medicare. NPR per [facility]    Standing Status: Future     Number of Occurrences: 1     Standing Expiration Date: 07/15/2016    Order Specific Question:  Reason for Exam (SYMPTOM  OR DIAGNOSIS REQUIRED)    Answer:  hematuria, flank pain    Order Specific Question:  Preferred imaging location?  Answer:  [facility]  . Urine Microscopic Only    Standing Status: Future     Number of Occurrences:      Standing Expiration Date: 04/14/2016  . POCT urinalysis dipstick    Meds ordered this encounter  Medications  . tamsulosin (FLOMAX) 0.4 MG CAPS capsule    Sig: Take 1 capsule (0.4 mg total) by mouth daily.    Dispense:  20 capsule    Refill:  0  . DISCONTD: HYDROcodone-acetaminophen (NORCO) 10-325 MG tablet     Sig: Take 1 tablet by mouth every 8 (eight) hours as needed.    Dispense:  10 tablet    Refill:  0  . DISCONTD: HYDROcodone-acetaminophen (NORCO) 10-325 MG tablet    Sig: Take 1 tablet by mouth every 8 (eight) hours as needed.    Dispense:  10 tablet    Refill:  0  . HYDROcodone-acetaminophen (NORCO) 10-325 MG tablet    Sig: Take 0.5 tablets by mouth every 8 (eight) hours as needed.    Dispense:  10 tablet    Refill:  0    [name]

## 2015-04-15 NOTE — Assessment & Plan Note (Addendum)
Single episode of hematuria this morning with several days of right flank pain. UA with blood. History and UA findings consistent with kidney stone in patient with kidney stone history. No signs of infection on UA. Appears well and non-toxic. Benign abdominal exam. Neurologically intact with his back pain. Will treat with flomax and vicodin for pain. Initial prescription for vicodin 10-325 one tablet every 8 hours as needed. Noted this after patient left and called and spoke with patient advising him to take half a tablet every 8 hours as needed for pain and spoke with pharmacy to change instructions to vicodin 10-325 mg take half a tablet every 8 hours as needed for pain. They noted this and will change the prescription in the system for the patient. Will check CT renal stone protocol to evaluate for stone. Patient will strain urine. Will send urine for culture and micro. Given return precautions.

## 2015-04-16 ENCOUNTER — Ambulatory Visit (HOSPITAL_COMMUNITY): Payer: Self-pay | Admitting: Psychiatry

## 2015-04-16 LAB — URINE CULTURE
Colony Count: NO GROWTH
ORGANISM ID, BACTERIA: NO GROWTH
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
## 2015-04-19 ENCOUNTER — Telehealth: Payer: Self-pay | Admitting: Family Medicine

## 2015-04-19 NOTE — Telephone Encounter (Signed)
Attempted to call the patient to advise of lab results. No answer. Left voicemail asking him to call back to the office.   When he calls back please inform him that his urine culture did not reveal any infection. Please enquire if he is still having back pain or if he is having any urinary symptoms or blood in his urine. If he is still having symptoms he should follow-up with his PCP for this. If he is not having symptoms he should stop the flomax and pain medication. If he has recurrent symptoms he should follow-up with his PCP. Thanks.

## 2015-04-21 NOTE — Telephone Encounter (Signed)
Attempted to call patient back to discuss message from [place]. Phone went straight to voicemail. Left message asking him to call back.

## 2015-04-21 NOTE — Telephone Encounter (Signed)
Patient stated that he is not having any blood in urine, but still having abdominal and back pain. Advised patient that he should call his PCP and schedule an appointment.

## 2015-04-21 NOTE — Telephone Encounter (Signed)
Patient has not called back. Could you call him regarding my previous note? Thanks.

## 2015-04-23 ENCOUNTER — Ambulatory Visit (HOSPITAL_COMMUNITY): Payer: Self-pay | Admitting: Psychiatry

## 2015-04-30 ENCOUNTER — Ambulatory Visit (HOSPITAL_COMMUNITY): Payer: Self-pay | Admitting: Psychiatry

## 2015-05-06 ENCOUNTER — Ambulatory Visit (INDEPENDENT_AMBULATORY_CARE_PROVIDER_SITE_OTHER): Payer: Federal, State, Local not specified - PPO | Admitting: Psychiatry

## 2015-05-06 DIAGNOSIS — F3163 Bipolar disorder, current episode mixed, severe, without psychotic features: Secondary | ICD-10-CM | POA: Diagnosis not present

## 2015-05-07 ENCOUNTER — Ambulatory Visit (INDEPENDENT_AMBULATORY_CARE_PROVIDER_SITE_OTHER): Payer: Federal, State, Local not specified - PPO | Admitting: Psychiatry

## 2015-05-07 ENCOUNTER — Encounter (HOSPITAL_COMMUNITY): Payer: Self-pay | Admitting: Psychiatry

## 2015-05-07 VITALS — BP 127/84 | HR 97 | Ht 73.0 in | Wt 201.6 lb

## 2015-05-07 DIAGNOSIS — F4310 Post-traumatic stress disorder, unspecified: Secondary | ICD-10-CM

## 2015-05-07 DIAGNOSIS — F3163 Bipolar disorder, current episode mixed, severe, without psychotic features: Secondary | ICD-10-CM

## 2015-05-07 DIAGNOSIS — F1221 Cannabis dependence, in remission: Secondary | ICD-10-CM | POA: Diagnosis not present

## 2015-05-07 MED ORDER — DIVALPROEX SODIUM ER 500 MG PO TB24: 1000.0000 mg | ORAL_TABLET | Freq: Every day | ORAL | Status: DC

## 2015-05-07 MED ORDER — BENZTROPINE MESYLATE 1 MG PO TABS: 1.0000 mg | ORAL_TABLET | Freq: Every day | ORAL | Status: DC

## 2015-05-07 MED ORDER — RISPERIDONE 1 MG PO TABS: 1.0000 mg | ORAL_TABLET | Freq: Every day | ORAL | Status: DC
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
## 2015-05-11 ENCOUNTER — Telehealth (HOSPITAL_COMMUNITY): Payer: Self-pay

## 2015-05-14 ENCOUNTER — Ambulatory Visit (HOSPITAL_COMMUNITY): Payer: Self-pay | Admitting: Psychiatry

## 2015-05-17 ENCOUNTER — Ambulatory Visit (INDEPENDENT_AMBULATORY_CARE_PROVIDER_SITE_OTHER): Payer: Medicare Other | Admitting: Psychiatry

## 2015-05-17 DIAGNOSIS — F3163 Bipolar disorder, current episode mixed, severe, without psychotic features: Secondary | ICD-10-CM
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
## 2015-05-27 ENCOUNTER — Ambulatory Visit (HOSPITAL_COMMUNITY): Payer: Self-pay | Admitting: Psychiatry

## 2015-06-10 ENCOUNTER — Ambulatory Visit (HOSPITAL_COMMUNITY): Payer: Self-pay | Admitting: Psychiatry

## 2015-06-24 ENCOUNTER — Ambulatory Visit (HOSPITAL_COMMUNITY): Payer: Self-pay | Admitting: Psychiatry

## 2015-06-30 ENCOUNTER — Ambulatory Visit (HOSPITAL_COMMUNITY): Payer: Self-pay | Admitting: Psychiatry

## 2015-07-01 ENCOUNTER — Ambulatory Visit (INDEPENDENT_AMBULATORY_CARE_PROVIDER_SITE_OTHER): Payer: Federal, State, Local not specified - PPO | Admitting: Psychiatry

## 2015-07-01 ENCOUNTER — Ambulatory Visit (HOSPITAL_COMMUNITY): Payer: Self-pay | Admitting: Psychiatry

## 2015-07-01 DIAGNOSIS — F3163 Bipolar disorder, current episode mixed, severe, without psychotic features: Secondary | ICD-10-CM

## 2015-07-02 NOTE — Progress Notes (Signed)
   THERAPIST PROGRESS NOTE  Session Time: 1:10-2:00  Participation Level: Active  Behavioral Response: CasualAlertDysthymic  Type of Therapy: Individual Therapy  Treatment Goals addressed: Anger/Irritability/Coping  Interventions: strength-based, person centered therapy  Summary: Curtis Townsend is a 39 y.o. male who presents with Bipolar Disorder.   Suicidal/Homicidal: Nowithout intent/plan  Therapist Response: Pt. Continues to present as "irritable" and "frustrated", resists characterization that he is angry. Pt. Reports that he is frustrated with election results and does not generally feel hopeful about the future of the country. Pt. Reports that his son was born two weeks ago and had some withdrawal symptoms because his wife was taking anti-depressants during end of pregnancy. Pt. Expressed his anger toward her health care providers for the pain that he has seen his son endure and not receiving warning of possible side effects of the medication on the child. Pt. Reports that he is able to see "hope" and "promise" in his daughters and "strength" in his son. Pt. Discussed wanting to raise his children with the ability to be discerning about the world and people. Counselor recommended support groups, but Pt. Continues to be opposed to groups indicating that he "does not trust people". Counselor expressed her understanding of his issues with trust and presented group as an opportunity for him to learn to be more trusting of others in a safe environment. Pt. Indicated that he would think about it, but was not sure that was something that he wanted to do. Pt. Reports problems with trusting family members, neighbors, and strangers and feels that people believe him to be "unstable". Significant time was spent in session discussion symptoms of his bipolar disorder, how he views himself and behaviors that might make him appear unstable to others such as anger and lack of trust in social  situations.  Plan: Continue with strength-based therapy. Return again in 2 weeks.  Diagnosis:Axis I: Bipolar, mixed  Axis II: No diagnosis   Nancie Neas, [facility] 07/02/2015

## 2015-07-29 ENCOUNTER — Ambulatory Visit (INDEPENDENT_AMBULATORY_CARE_PROVIDER_SITE_OTHER): Payer: Federal, State, Local not specified - PPO | Admitting: Psychiatry

## 2015-07-29 ENCOUNTER — Encounter (HOSPITAL_COMMUNITY): Payer: Self-pay | Admitting: Psychiatry

## 2015-07-29 VITALS — BP 108/74 | HR 86 | Ht 72.0 in | Wt 211.0 lb

## 2015-07-29 DIAGNOSIS — F3163 Bipolar disorder, current episode mixed, severe, without psychotic features: Secondary | ICD-10-CM

## 2015-07-30 NOTE — Progress Notes (Signed)
   THERAPIST PROGRESS NOTE Session Time: 1:10-2:00  Participation Level: Active  Behavioral Response: CasualAlertDysthymic  Type of Therapy: Individual Therapy  Treatment Goals addressed: Anger/Irritability/Coping  Interventions: strength-based, person centered therapy  Summary: ANTAWN TOSO is a 40 y.o. male who presents with Bipolar Disorder.   Suicidal/Homicidal: Nowithout intent/plan  Therapist Response: Pt. Continues to present as frustrated and attributes his frustration to the current political climate. Pt. Discussed his lack of hope regarding social, economic and political beliefs. Pt. Reports that he is in a good place with his wife and happy with his children. Pt. Reports that he is sleep deprived due to caregiving for infant son. Pt. Confronted counselor on previous session's suggestion that he seek support group and friends and family support for his wife. Pt. 's belief is that he take care of his problems on his own so that his wife is not affected. Significant time in session spent normalizing the need for social and community support to better understand the diagnosis and recovery process and introduced idea that his wife may need support even if he is doing all of the right things to take care of himself.  Plan: Continue with strength-based therapy. Return again in 1 weeks.  Diagnosis:Axis I: Bipolar, mixed  Axis II: No diagnosis    Nancie Neas, [facility] 07/30/2015

## 2015-08-05 ENCOUNTER — Ambulatory Visit (INDEPENDENT_AMBULATORY_CARE_PROVIDER_SITE_OTHER): Payer: Federal, State, Local not specified - PPO | Admitting: Psychiatry

## 2015-08-05 VITALS — BP 129/83 | HR 89 | Ht 73.0 in | Wt 209.6 lb

## 2015-08-05 DIAGNOSIS — F3163 Bipolar disorder, current episode mixed, severe, without psychotic features: Secondary | ICD-10-CM | POA: Diagnosis not present

## 2015-08-06 NOTE — Progress Notes (Signed)
   THERAPIST PROGRESS NOTE  Session Time: 1:10-2:00  Participation Level: Active  Behavioral Response: CasualAlertDysthymic  Type of Therapy: Individual Therapy  Treatment Goals addressed: Anger/Irritability/Coping  Interventions: strength-based, person centered therapy  Summary: Curtis Townsend is a 40 y.o. male who presents with Bipolar Disorder.   Suicidal/Homicidal: Nowithout intent/plan  Therapist Response: Pt. Presents with improved mood compared to last session, talkative, focused on political climate as source of frustration. Pt. Reports that he is happy and finding contentment in his family. Session focused on medication management. Pt. Reports that he takes his medication irregularly as he thinks he needs it. Pt. Was encouraged to take medication as prescribed and educated regarding not getting desired affect and potential to for destabilizing mood, but in the alternative to record how he was taking the medication so that he could discuss the schedule with [name]. Pt. Agreed to record his medication to that he can discuss with the doctor.  Plan: Continue with strength-based therapy. Return again in 1 weeks.  Diagnosis:Axis I: Bipolar, mixed  Axis II: No diagnosis   Nancie Neas, [facility] 08/06/2015

## 2015-08-09 ENCOUNTER — Ambulatory Visit (INDEPENDENT_AMBULATORY_CARE_PROVIDER_SITE_OTHER): Payer: Federal, State, Local not specified - PPO | Admitting: Psychiatry

## 2015-08-09 ENCOUNTER — Encounter (HOSPITAL_COMMUNITY): Payer: Self-pay | Admitting: Psychiatry

## 2015-08-09 VITALS — BP 125/76 | HR 87 | Ht 73.0 in | Wt 210.2 lb

## 2015-08-09 DIAGNOSIS — F4310 Post-traumatic stress disorder, unspecified: Secondary | ICD-10-CM

## 2015-08-09 DIAGNOSIS — F3163 Bipolar disorder, current episode mixed, severe, without psychotic features: Secondary | ICD-10-CM | POA: Diagnosis not present

## 2015-08-09 DIAGNOSIS — F1221 Cannabis dependence, in remission: Secondary | ICD-10-CM

## 2015-08-09 MED ORDER — DIVALPROEX SODIUM ER 500 MG PO TB24: 1000.0000 mg | ORAL_TABLET | Freq: Every day | ORAL | Status: DC

## 2015-08-09 MED ORDER — FLUOXETINE HCL 40 MG PO CAPS: 40.0000 mg | ORAL_CAPSULE | Freq: Every day | ORAL | Status: DC

## 2015-08-09 MED ORDER — BENZTROPINE MESYLATE 1 MG PO TABS: 1.0000 mg | ORAL_TABLET | Freq: Every day | ORAL | Status: DC

## 2015-08-09 MED ORDER — RISPERIDONE 1 MG PO TABS: 1.0000 mg | ORAL_TABLET | Freq: Every day | ORAL | Status: DC
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
## 2015-08-17 ENCOUNTER — Ambulatory Visit (HOSPITAL_COMMUNITY): Payer: Self-pay | Admitting: Psychiatry

## 2015-08-24 ENCOUNTER — Ambulatory Visit (INDEPENDENT_AMBULATORY_CARE_PROVIDER_SITE_OTHER): Payer: Federal, State, Local not specified - PPO | Admitting: Psychiatry

## 2015-08-24 DIAGNOSIS — F3163 Bipolar disorder, current episode mixed, severe, without psychotic features: Secondary | ICD-10-CM | POA: Diagnosis not present
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
## 2015-10-07 ENCOUNTER — Encounter (INDEPENDENT_AMBULATORY_CARE_PROVIDER_SITE_OTHER): Payer: Self-pay

## 2015-11-01 ENCOUNTER — Ambulatory Visit (INDEPENDENT_AMBULATORY_CARE_PROVIDER_SITE_OTHER): Payer: Federal, State, Local not specified - PPO | Admitting: Psychiatry

## 2015-11-01 DIAGNOSIS — F3163 Bipolar disorder, current episode mixed, severe, without psychotic features: Secondary | ICD-10-CM

## 2015-11-01 DIAGNOSIS — F4310 Post-traumatic stress disorder, unspecified: Secondary | ICD-10-CM
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
## 2015-11-08 ENCOUNTER — Ambulatory Visit (INDEPENDENT_AMBULATORY_CARE_PROVIDER_SITE_OTHER): Payer: Federal, State, Local not specified - PPO | Admitting: Psychiatry

## 2015-11-08 ENCOUNTER — Encounter (HOSPITAL_COMMUNITY): Payer: Self-pay | Admitting: Psychiatry

## 2015-11-08 VITALS — BP 104/66 | HR 98 | Ht 73.0 in | Wt 214.6 lb

## 2015-11-08 DIAGNOSIS — F3163 Bipolar disorder, current episode mixed, severe, without psychotic features: Secondary | ICD-10-CM | POA: Diagnosis not present

## 2015-11-08 DIAGNOSIS — F4310 Post-traumatic stress disorder, unspecified: Secondary | ICD-10-CM

## 2015-11-08 DIAGNOSIS — F1221 Cannabis dependence, in remission: Secondary | ICD-10-CM

## 2015-11-08 DIAGNOSIS — Z79899 Other long term (current) drug therapy: Secondary | ICD-10-CM

## 2015-11-08 MED ORDER — BENZTROPINE MESYLATE 1 MG PO TABS: 1.0000 mg | ORAL_TABLET | Freq: Every day | ORAL | Status: DC

## 2015-11-08 MED ORDER — RISPERIDONE 1 MG PO TABS
1.0000 mg | ORAL_TABLET | Freq: Every day | ORAL | Status: DC
Start: 2015-11-08 — End: 2016-07-31

## 2015-11-08 MED ORDER — FLUOXETINE HCL 40 MG PO CAPS: 40.0000 mg | ORAL_CAPSULE | Freq: Every day | ORAL | Status: DC

## 2015-11-08 MED ORDER — DIVALPROEX SODIUM ER 500 MG PO TB24: 1000.0000 mg | ORAL_TABLET | Freq: Every day | ORAL | Status: DC
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
## 2015-11-09 ENCOUNTER — Ambulatory Visit (HOSPITAL_COMMUNITY): Payer: Self-pay | Admitting: Psychiatry

## 2015-11-16 ENCOUNTER — Ambulatory Visit (HOSPITAL_COMMUNITY): Payer: Medicare Other | Admitting: Psychiatry

## 2015-11-16 DIAGNOSIS — F3163 Bipolar disorder, current episode mixed, severe, without psychotic features: Secondary | ICD-10-CM
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
## 2015-11-23 ENCOUNTER — Ambulatory Visit (INDEPENDENT_AMBULATORY_CARE_PROVIDER_SITE_OTHER): Payer: Medicare Other | Admitting: Psychiatry

## 2015-11-23 DIAGNOSIS — F3163 Bipolar disorder, current episode mixed, severe, without psychotic features: Secondary | ICD-10-CM | POA: Diagnosis not present

## 2015-11-23 NOTE — Progress Notes (Signed)
   THERAPIST PROGRESS NOTE  Session Time: 1:10-2:00  Participation Level: Active  Behavioral Response: CasualAlertDysthymic  Type of Therapy: Individual Therapy  Treatment Goals addressed: Anger/Irritability/Coping  Interventions: strength-based, person centered therapy  Summary: Curtis Townsend is a 40 y.o. male who presents with Bipolar Disorder.   Suicidal/Homicidal: Nowithout intent/plan  Therapist Response: Pt. Presented with significantly brightened affect compared to last week's session. Pt. Reported that he was able to complete medical exam for disability with minimal anxiety. Pt. Shared that he was sleep deprived because his mother went to [place] for vacation and he was responsible for more of caregiving for children. Pt. Brought 1 month old son to session. Pt. Completed session 1 of CPT. Counselor administered PHQ9 (scored 7 indicating mild depression) and the PCL-5 weekly (scored 28 indicating high PTSD).  Plan: Continue with CPT treatment for PTSD. Return again in 1 weeks.  Diagnosis:Axis I: Bipolar, mixed  Axis II: No diagnosis   Nancie Neas, [facility] 11/23/2015

## 2015-11-30 ENCOUNTER — Ambulatory Visit (INDEPENDENT_AMBULATORY_CARE_PROVIDER_SITE_OTHER): Payer: Medicare Other | Admitting: Psychiatry

## 2015-11-30 DIAGNOSIS — F3163 Bipolar disorder, current episode mixed, severe, without psychotic features: Secondary | ICD-10-CM

## 2015-12-01 NOTE — Progress Notes (Signed)
   THERAPIST PROGRESS NOTE  Session Time: 1:10-2:00  Participation Level: Active  Behavioral Response: CasualAlertDysthymic  Type of Therapy: Individual Therapy  Treatment Goals addressed: Anger/Irritability/Coping  Interventions: strength-based, person centered therapy  Summary: Curtis Townsend is a 40 y.o. male who presents with Bipolar Disorder.   Suicidal/Homicidal: Nowithout intent/plan  Therapist Response: Pt. Presented with significantly brightened affect. Pt. Completed session 2 of CPT with focus on stuck point log. Pt. Reported problems with lack of trust in his marriage and frustration with his wife's feelings of insecurity. Pt. Reported that he went to church with his family and was able to share with several members before and after the service, pt. Acknowledged need for social interaction and awareness that he developed tendency to isolate because it was modeled for him by his mother. Pt. Also shared that he spent time time with an older neighbor who is becoming a friend and Product manager. Pt. Reported awareness that this was a significant improvement and development of need of people and desire to trust others that was motivated for his desire to set a healthier example for his children.  Plan: Continue with CPT treatment for PTSD. Return again in 1 weeks.  Diagnosis:Axis I: Bipolar, mixed  Axis II: No diagnosis   Nancie Neas, [facility] 12/01/2015

## 2015-12-07 ENCOUNTER — Ambulatory Visit (INDEPENDENT_AMBULATORY_CARE_PROVIDER_SITE_OTHER): Payer: Medicare Other | Admitting: Psychiatry

## 2015-12-07 DIAGNOSIS — F3163 Bipolar disorder, current episode mixed, severe, without psychotic features: Secondary | ICD-10-CM | POA: Diagnosis not present
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
## 2015-12-13 DIAGNOSIS — K080 Exfoliation of teeth due to systemic causes: Secondary | ICD-10-CM | POA: Diagnosis not present

## 2015-12-21 DIAGNOSIS — J320 Chronic maxillary sinusitis: Secondary | ICD-10-CM | POA: Diagnosis not present

## 2015-12-28 ENCOUNTER — Ambulatory Visit (INDEPENDENT_AMBULATORY_CARE_PROVIDER_SITE_OTHER): Payer: Medicare Other | Admitting: Psychiatry

## 2015-12-28 DIAGNOSIS — F3163 Bipolar disorder, current episode mixed, severe, without psychotic features: Secondary | ICD-10-CM | POA: Diagnosis not present
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
## 2016-01-04 ENCOUNTER — Ambulatory Visit (HOSPITAL_COMMUNITY): Payer: Self-pay | Admitting: Psychiatry

## 2016-01-14 DIAGNOSIS — J320 Chronic maxillary sinusitis: Secondary | ICD-10-CM | POA: Diagnosis not present

## 2016-01-14 DIAGNOSIS — J3489 Other specified disorders of nose and nasal sinuses: Secondary | ICD-10-CM | POA: Diagnosis not present

## 2016-01-18 ENCOUNTER — Ambulatory Visit (INDEPENDENT_AMBULATORY_CARE_PROVIDER_SITE_OTHER): Payer: Medicare Other | Admitting: Psychiatry

## 2016-01-18 DIAGNOSIS — F3163 Bipolar disorder, current episode mixed, severe, without psychotic features: Secondary | ICD-10-CM
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
## 2016-01-25 ENCOUNTER — Ambulatory Visit (INDEPENDENT_AMBULATORY_CARE_PROVIDER_SITE_OTHER): Payer: Medicare Other | Admitting: Psychiatry

## 2016-01-25 DIAGNOSIS — F3163 Bipolar disorder, current episode mixed, severe, without psychotic features: Secondary | ICD-10-CM
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
## 2016-01-31 ENCOUNTER — Encounter: Payer: Medicare Other | Admitting: Internal Medicine

## 2016-02-01 ENCOUNTER — Ambulatory Visit (HOSPITAL_COMMUNITY): Payer: Self-pay | Admitting: Psychiatry

## 2016-02-08 ENCOUNTER — Ambulatory Visit (HOSPITAL_COMMUNITY): Payer: Self-pay | Admitting: Psychiatry

## 2016-02-09 ENCOUNTER — Encounter: Payer: Medicare Other | Admitting: Internal Medicine

## 2016-02-09 ENCOUNTER — Ambulatory Visit (HOSPITAL_COMMUNITY): Payer: Self-pay | Admitting: Psychiatry

## 2016-02-15 ENCOUNTER — Ambulatory Visit (HOSPITAL_COMMUNITY): Payer: Self-pay | Admitting: Psychiatry

## 2016-02-22 ENCOUNTER — Ambulatory Visit (INDEPENDENT_AMBULATORY_CARE_PROVIDER_SITE_OTHER): Payer: Medicare Other | Admitting: Internal Medicine

## 2016-02-22 ENCOUNTER — Encounter: Payer: Self-pay | Admitting: Internal Medicine

## 2016-02-22 ENCOUNTER — Ambulatory Visit (INDEPENDENT_AMBULATORY_CARE_PROVIDER_SITE_OTHER): Payer: Medicare Other | Admitting: Psychiatry

## 2016-02-22 ENCOUNTER — Other Ambulatory Visit (INDEPENDENT_AMBULATORY_CARE_PROVIDER_SITE_OTHER): Payer: Medicare Other

## 2016-02-22 VITALS — BP 118/70 | HR 75 | Temp 98.4°F | Resp 16 | Ht 73.0 in | Wt 212.2 lb

## 2016-02-22 DIAGNOSIS — R319 Hematuria, unspecified: Secondary | ICD-10-CM | POA: Diagnosis not present

## 2016-02-22 DIAGNOSIS — Z0000 Encounter for general adult medical examination without abnormal findings: Secondary | ICD-10-CM | POA: Diagnosis not present

## 2016-02-22 DIAGNOSIS — Z23 Encounter for immunization: Secondary | ICD-10-CM

## 2016-02-22 DIAGNOSIS — F3160 Bipolar disorder, current episode mixed, unspecified: Secondary | ICD-10-CM | POA: Diagnosis not present

## 2016-02-22 DIAGNOSIS — R739 Hyperglycemia, unspecified: Secondary | ICD-10-CM | POA: Diagnosis not present

## 2016-02-22 DIAGNOSIS — E785 Hyperlipidemia, unspecified: Secondary | ICD-10-CM | POA: Diagnosis not present

## 2016-02-22 DIAGNOSIS — F3163 Bipolar disorder, current episode mixed, severe, without psychotic features: Secondary | ICD-10-CM

## 2016-02-22 DIAGNOSIS — R946 Abnormal results of thyroid function studies: Secondary | ICD-10-CM

## 2016-02-22 DIAGNOSIS — R7989 Other specified abnormal findings of blood chemistry: Secondary | ICD-10-CM

## 2016-02-22 DIAGNOSIS — T888 Other specified complications of surgical and medical care, not elsewhere classified: Secondary | ICD-10-CM | POA: Diagnosis not present

## 2016-02-22 LAB — COMPREHENSIVE METABOLIC PANEL
ALBUMIN: 4 g/dL (ref 3.5–5.2)
ALK PHOS: 72 U/L (ref 39–117)
ALT: 27 U/L (ref 0–53)
AST: 20 U/L (ref 0–37)
BUN: 8 mg/dL (ref 6–23)
CALCIUM: 9.5 mg/dL (ref 8.4–10.5)
CHLORIDE: 105 meq/L (ref 96–112)
CO2: 27 mEq/L (ref 19–32)
CREATININE: 1.09 mg/dL (ref 0.40–1.50)
GFR: 96.27 mL/min (ref >60.00)
Glucose, Bld: 92 mg/dL (ref 70–99)
Potassium: 4.1 mEq/L (ref 3.5–5.1)
Sodium: 139 mEq/L (ref 135–145)
TOTAL PROTEIN: 6.8 g/dL (ref 6.0–8.3)
Total Bilirubin: 0.4 mg/dL (ref 0.2–1.2)

## 2016-02-22 LAB — LIPID PANEL
CHOLESTEROL: 197 mg/dL (ref 0–200)
HDL: 45 mg/dL (ref >39.00)
LDL CALC: 120 mg/dL — AB (ref 0–99)
NonHDL: 151.95
TRIGLYCERIDES: 160 mg/dL — AB (ref 0.0–149.0)
Total CHOL/HDL Ratio: 4
VLDL: 32 mg/dL (ref 0.0–40.0)

## 2016-02-22 LAB — CBC WITH DIFFERENTIAL/PLATELET
BASOS PCT: 1.2 % (ref 0.0–3.0)
Basophils Absolute: 0.1 10*3/uL (ref 0.0–0.1)
EOS PCT: 2 % (ref 0.0–5.0)
Eosinophils Absolute: 0.1 10*3/uL (ref 0.0–0.7)
HCT: 45.7 % (ref 39.0–52.0)
Hemoglobin: 15.4 g/dL (ref 13.0–17.0)
LYMPHS ABS: 2.6 10*3/uL (ref 0.7–4.0)
Lymphocytes Relative: 54.9 % — ABNORMAL HIGH (ref 12.0–46.0)
MCHC: 33.7 g/dL (ref 30.0–36.0)
MCV: 91.1 fl (ref 78.0–100.0)
MONO ABS: 0.4 10*3/uL (ref 0.1–1.0)
Monocytes Relative: 8.1 % (ref 3.0–12.0)
Neutro Abs: 1.6 10*3/uL (ref 1.4–7.7)
Neutrophils Relative %: 33.8 % — ABNORMAL LOW (ref 43.0–77.0)
Platelets: 170 10*3/uL (ref 150.0–400.0)
RBC: 5.01 Mil/uL (ref 4.22–5.81)
RDW: 13.5 % (ref 11.5–15.5)
WBC: 4.8 10*3/uL (ref 4.0–10.5)

## 2016-02-22 LAB — HEMOGLOBIN A1C: Hgb A1c MFr Bld: 5.8 % (ref 4.6–6.5)

## 2016-02-22 LAB — TSH: TSH: 0.44 u[IU]/mL (ref 0.35–4.50)

## 2016-02-22 NOTE — Patient Instructions (Signed)

## 2016-02-22 NOTE — Progress Notes (Signed)
Subjective:  Patient ID: Curtis Townsend, male    DOB: 1976/06/24  Age: 40 y.o. MRN: [id number]  CC: Annual Exam   HPI CORDON LARI presents for a CPX.  He feels well and offers no complaints.  Outpatient Medications Prior to Visit  Medication Sig Dispense Refill  . benztropine (COGENTIN) 1 MG tablet Take 1 tablet (1 mg total) by mouth daily. 30 tablet 2  . divalproex (DEPAKOTE ER) 500 MG 24 hr tablet Take 2 tablets (1,000 mg total) by mouth daily. 69 tablet 2  . FLUoxetine (PROZAC) 40 MG capsule Take 1 capsule (40 mg total) by mouth daily. 30 capsule 2  . risperiDONE (RISPERDAL) 1 MG tablet Take 1 tablet (1 mg total) by mouth at bedtime. 30 tablet 2   No facility-administered medications prior to visit.     ROS Review of Systems  Constitutional: Negative.  Negative for appetite change, fatigue and unexpected weight change.  HENT: Negative.   Eyes: Negative.   Respiratory: Negative.  Negative for cough, choking, shortness of breath and stridor.   Cardiovascular: Negative.  Negative for chest pain, palpitations and leg swelling.  Gastrointestinal: Negative.  Negative for abdominal pain, constipation, diarrhea, nausea and vomiting.  Endocrine: Negative.   Genitourinary: Negative.  Negative for difficulty urinating, discharge, genital sores, penile pain, penile swelling, scrotal swelling, testicular pain and urgency.  Musculoskeletal: Negative.   Skin: Negative.  Negative for rash.  Allergic/Immunologic: Negative.   Neurological: Negative.   Hematological: Negative.  Negative for adenopathy. Does not bruise/bleed easily.  Psychiatric/Behavioral: Negative.  Negative for decreased concentration, dysphoric mood and sleep disturbance. The patient is not nervous/anxious.     Objective:  BP 118/70 (BP Location: Left Arm, Patient Position: Sitting, Cuff Size: Normal)   Pulse 75   Temp 98.4 F (36.9 C) (Oral)   Resp 16   Ht 6\' 1"  (1.854 m)   Wt 212 lb 4 oz (96.3 kg)    SpO2 97%   BMI 28.00 kg/m   BP Readings from Last 3 Encounters:  02/22/16 118/70  04/15/15 106/70  01/27/15 104/64    Wt Readings from Last 3 Encounters:  02/22/16 212 lb 4 oz (96.3 kg)  04/15/15 203 lb (92.1 kg)  01/27/15 203 lb (92.1 kg)    Physical Exam  Constitutional: He is oriented to person, place, and time. No distress.  HENT:  Head: Normocephalic and atraumatic.  Mouth/Throat: Oropharynx is clear and moist. No oropharyngeal exudate.  Eyes: Conjunctivae are normal. Right eye exhibits no discharge. Left eye exhibits no discharge. No scleral icterus.  Neck: Normal range of motion. Neck supple. No JVD present. No tracheal deviation present. No thyromegaly present.  Cardiovascular: Normal rate, regular rhythm, normal heart sounds and intact distal pulses.  Exam reveals no gallop and no friction rub.   No murmur heard. Pulmonary/Chest: Effort normal and breath sounds normal. No stridor. No respiratory distress. He has no wheezes. He has no rales. He exhibits no tenderness.  Abdominal: Soft. Bowel sounds are normal. He exhibits no distension and no mass. There is no tenderness. There is no rebound and no guarding.  Musculoskeletal: Normal range of motion. He exhibits no edema, tenderness or deformity.  Lymphadenopathy:    He has no cervical adenopathy.  Neurological: He is oriented to person, place, and time.  Skin: Skin is warm and dry. No rash noted. He is not diaphoretic. No erythema. No pallor.  Psychiatric: He has a normal mood and affect. His behavior is normal. Judgment and  thought content normal.  Vitals reviewed.   Lab Results  Component Value Date   WBC 4.8 02/22/2016   HGB 15.4 02/22/2016   HCT 45.7 02/22/2016   PLT 170.0 02/22/2016   GLUCOSE 92 02/22/2016   CHOL 197 02/22/2016   TRIG 160.0 (H) 02/22/2016   HDL 45.00 02/22/2016   LDLCALC 120 (H) 02/22/2016   ALT 27 02/22/2016   AST 20 02/22/2016   NA 139 02/22/2016   K 4.1 02/22/2016   CL 105 02/22/2016    CREATININE 1.09 02/22/2016   BUN 8 02/22/2016   CO2 27 02/22/2016   TSH 0.44 02/22/2016   HGBA1C 5.8 02/22/2016    Ct Renal Stone Study  Result Date: 04/15/2015 CLINICAL DATA:  Right flank pain, hematuria, history of kidney stone EXAM: CT ABDOMEN AND PELVIS WITHOUT CONTRAST TECHNIQUE: Multidetector CT imaging of the abdomen and pelvis was performed following the standard protocol without IV contrast. COMPARISON:  CT abdomen pelvis of 06/30/2010 FINDINGS: The lung bases are clear. The liver is unremarkable in the unenhanced state. No calcified gallstones are seen. The pancreas is unremarkable and the pancreatic duct is not dilated. The adrenal glands and spleen are unremarkable. The stomach is decompressed. There may be tiny nonobstructing renal calculi bilaterally. These are only seen on the coronal images and are questionable. The proximal ureters are normal in caliber. The abdominal aorta is normal in caliber. The distal ureters are normal in caliber and no distal ureteral calculus is seen. The urinary bladder is not well distended but no abnormality is seen. The prostate is within normal limits in size for age. There is feces throughout the colon. The terminal ileum and the appendix are unremarkable. The lumbar vertebrae are in normal alignment with normal disc spaces. IMPRESSION: 1. There may be tiny nonobstructing renal calculi bilaterally but no hydronephrosis is seen. No ureteral calculi are noted. 2. The appendix and terminal ileum are unremarkable. Electronically Signed   By: Ivar Drape M.D.   On: 04/15/2015 14:14    Assessment & Plan:   Breton was seen today for annual exam.  Diagnoses and all orders for this visit:  Routine general medical examination at a health care facility  Low TSH level- he is euthyroid and his TSH is normal now -     CBC with Differential/Platelet; Future -     TSH; Future  Hematuria- this has resolved -     Comprehensive metabolic panel;  Future  Hyperglycemia- improvement noted -     Comprehensive metabolic panel; Future -     Hemoglobin A1c; Future  Hyperlipidemia with target LDL less than 160- statin therapy is not indicated -     Lipid panel; Future -     TSH; Future  Need for prophylactic vaccination with combined diphtheria-tetanus-pertussis (DTP) vaccine -     Tdap vaccine greater than or equal to 7yo IM   I am having Mr. Rudnik maintain his risperiDONE, FLUoxetine, divalproex, and benztropine.  No orders of the defined types were placed in this encounter.    Follow-up: Return if symptoms worsen or fail to improve.  Scarlette Calico, MD

## 2016-02-22 NOTE — Progress Notes (Signed)
Pre visit review using our clinic review tool, if applicable. No additional management support is needed unless otherwise documented below in the visit note. 

## 2016-02-22 NOTE — Progress Notes (Signed)
   THERAPIST PROGRESS NOTE    Session Time: 1:10-2:00  Participation Level: Active  Behavioral Response: CasualAlertEuthymic  Type of Therapy: Individual Therapy  Treatment Goals addressed: Anger/Irritability/Coping  Interventions: strength-based, person centered therapy, psychoeducation  Summary: Curtis Townsend is a 40 y.o. male who presents with Bipolar Disorder.   Suicidal/Homicidal: Nowithout intent/plan  Therapist Response: Pt. Continues to present with euthymic mood. Pt. Reports that things are going well in his life, happy with relationships with his children and his wife. Pt. Discussed recent conversation with eldest daughter who gives him feedback regarding his mood and helps him to manage his anger constructively. Pt. Discussed his mother's hoarding, poor hygiene, and history of depression. Significant part of session was spent on Pt.'s ability to cope with his mother's illness and resisting his tendency to want to try to control her. Pt. Discussed history of growing up very poor and how his poverty has triggered tendency to collect clothes, shoes, and hats and fears of not being prepared with clothing items. Pt. Related to his wife's experience of growing up and how it has affected her tendency to hoard food items because of her fears of not having enough for their children.  Plan: Continue with CBT. Return again in 1 week.  Diagnosis:Axis I: Bipolar, mixed  Axis II: No diagnosis  Nancie Neas, [facility] 02/22/2016

## 2016-02-24 NOTE — Assessment & Plan Note (Signed)

## 2016-02-29 ENCOUNTER — Ambulatory Visit (INDEPENDENT_AMBULATORY_CARE_PROVIDER_SITE_OTHER): Payer: Medicare Other | Admitting: Psychiatry

## 2016-02-29 DIAGNOSIS — F3163 Bipolar disorder, current episode mixed, severe, without psychotic features: Secondary | ICD-10-CM
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
## 2016-03-02 ENCOUNTER — Ambulatory Visit (HOSPITAL_COMMUNITY): Payer: Self-pay | Admitting: Psychiatry

## 2016-03-07 ENCOUNTER — Ambulatory Visit (INDEPENDENT_AMBULATORY_CARE_PROVIDER_SITE_OTHER): Payer: Medicare Other | Admitting: Psychiatry

## 2016-03-07 DIAGNOSIS — F3163 Bipolar disorder, current episode mixed, severe, without psychotic features: Secondary | ICD-10-CM | POA: Diagnosis not present
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
## 2016-03-21 ENCOUNTER — Ambulatory Visit (HOSPITAL_COMMUNITY): Payer: Self-pay | Admitting: Psychiatry

## 2016-03-24 ENCOUNTER — Other Ambulatory Visit: Payer: Self-pay | Admitting: Otolaryngology

## 2016-03-24 DIAGNOSIS — J321 Chronic frontal sinusitis: Secondary | ICD-10-CM | POA: Diagnosis not present

## 2016-03-24 DIAGNOSIS — J320 Chronic maxillary sinusitis: Secondary | ICD-10-CM | POA: Diagnosis not present

## 2016-03-24 DIAGNOSIS — J019 Acute sinusitis, unspecified: Secondary | ICD-10-CM | POA: Diagnosis not present

## 2016-03-24 DIAGNOSIS — J322 Chronic ethmoidal sinusitis: Secondary | ICD-10-CM | POA: Diagnosis not present

## 2016-03-29 ENCOUNTER — Ambulatory Visit (INDEPENDENT_AMBULATORY_CARE_PROVIDER_SITE_OTHER): Payer: Medicare Other | Admitting: Psychiatry

## 2016-03-29 DIAGNOSIS — F3163 Bipolar disorder, current episode mixed, severe, without psychotic features: Secondary | ICD-10-CM

## 2016-03-29 DIAGNOSIS — F3160 Bipolar disorder, current episode mixed, unspecified: Secondary | ICD-10-CM

## 2016-03-31 NOTE — Progress Notes (Signed)
    Daily Group Progress Note  Program: IOP  Session Time: 3:40-4:30  Participation Level:Active  Behavioral Response:CasualAlertEuthymic  Type of Therapy: Individual Therapy  Treatment Goals addressed: Anger/Irritability/Coping  Interventions:strength-based, person centered therapy, psychoeducation  Summary: Curtis Townsend is a 40 y.o. male who presents with Bipolar Disorder.   Suicidal/Homicidal:Nowithout intent/plan  Therapist Response: Pt. Continues to present as talkative, engaged in therapeutic process, mostly stable mood with mild irritability. Pt. Discussed recent experience with his landlord. Pt. Described recent events that caused him to be late with his rent due to family medical bills. Pt. Expressed his frustration and anger towards his landlord for not being more willing to work with him regarding his rent payments. Pt. Discussed his history as a good tenant and attempts to communicate with his landlord regarding his family's unexpected medical bills. Pt. Discussed his plans to move and eventually purchasing a home for his family. Pt. Discussed recent political events that are a trigger for his anger, and that over the past few weeks that he has consciously avoided conversations with others that would trigger an undesired response. Pt. Discussed theme of "respect for others" and processed his views that love of others is required of him biblically, but his personal belief is that he does not have to respect the opinions of others.   Plan: Continue with CBT.Return again in 2 weeks.  Diagnosis:Axis I:Bipolar, mixed  Axis II:No diagnosis  Nancie Neas, LPC

## 2016-04-03 ENCOUNTER — Ambulatory Visit (HOSPITAL_COMMUNITY): Payer: Self-pay | Admitting: Psychiatry

## 2016-04-11 ENCOUNTER — Ambulatory Visit (INDEPENDENT_AMBULATORY_CARE_PROVIDER_SITE_OTHER): Payer: Medicare Other | Admitting: Psychiatry

## 2016-04-11 DIAGNOSIS — F3163 Bipolar disorder, current episode mixed, severe, without psychotic features: Secondary | ICD-10-CM
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
## 2016-04-17 DIAGNOSIS — J324 Chronic pansinusitis: Secondary | ICD-10-CM | POA: Diagnosis not present

## 2016-04-17 DIAGNOSIS — J343 Hypertrophy of nasal turbinates: Secondary | ICD-10-CM | POA: Diagnosis not present

## 2016-04-17 DIAGNOSIS — F17210 Nicotine dependence, cigarettes, uncomplicated: Secondary | ICD-10-CM | POA: Diagnosis not present

## 2016-04-18 ENCOUNTER — Ambulatory Visit (INDEPENDENT_AMBULATORY_CARE_PROVIDER_SITE_OTHER): Payer: Medicare Other | Admitting: Psychiatry

## 2016-04-18 DIAGNOSIS — F3163 Bipolar disorder, current episode mixed, severe, without psychotic features: Secondary | ICD-10-CM | POA: Diagnosis not present

## 2016-04-19 NOTE — Progress Notes (Signed)
   THERAPIST PROGRESS NOTE  Session Time: 1:10-2:00  Participation Level:Active  Behavioral Response:CasualAlertEuthymic  Type of Therapy: Individual Therapy  Treatment Goals addressed: Anger/Irritability/Coping  Interventions:strength-based, person centered therapy, psychoeducation  Summary: PLEAS NEWLON is a 40 y.o. male who presents with Bipolar Disorder.   Suicidal/Homicidal:Nowithout intent/plan  Therapist Response: Pt. Continues to present as talkative, engaged in therapeutic process, mostly stable mood with mild irritability. Pt. Reports frustration with Counselor's statement from prior session with suggestion that he does not like to be challenged. Pt. Was challenged regarding belief "I am damaged". Counselor validated that patient has had numerous incidents in his childhood that changed his thinking about some aspects of his life, but disputed belief that he is damaged and used evidence related family life, education and work history of evidence of his agency and ability to make good choices in his life. Pt. Continues to state "I do not trust anyone" Pt. Worked to develop exceptions to his rule including ability to accept help from a police office for a flat tire in a parking lot. Pt. Was able to make progress as seeing trust as a relative concept depending on circumstance. Pt. Discussed belief that "I am owed an explanation for the bad things that have happened to me" which is at the root of his anger towards other. Pt. Was challenged about his belief that he is owed an explanation and that bad things happen to good people arbitrarily without explanation. Pt. Made some progress in seeing that an explanation may be optimal, but not necessarily realistic expectation in all situations.    Plan: Continue with CBT.Return again in 2weeks.  Diagnosis:Axis I:Bipolar, mixed  Axis II:No diagnosis    Nancie Neas,  [facility] 04/19/2016

## 2016-04-20 ENCOUNTER — Ambulatory Visit (INDEPENDENT_AMBULATORY_CARE_PROVIDER_SITE_OTHER): Payer: Federal, State, Local not specified - PPO | Admitting: Psychiatry

## 2016-04-20 ENCOUNTER — Encounter (HOSPITAL_COMMUNITY): Payer: Self-pay | Admitting: Psychiatry

## 2016-04-20 VITALS — BP 118/70 | HR 76 | Ht 73.0 in | Wt 201.4 lb

## 2016-04-20 DIAGNOSIS — F4310 Post-traumatic stress disorder, unspecified: Secondary | ICD-10-CM | POA: Diagnosis not present

## 2016-04-20 DIAGNOSIS — Z79899 Other long term (current) drug therapy: Secondary | ICD-10-CM

## 2016-04-20 DIAGNOSIS — F3163 Bipolar disorder, current episode mixed, severe, without psychotic features: Secondary | ICD-10-CM | POA: Diagnosis not present

## 2016-04-20 DIAGNOSIS — F1221 Cannabis dependence, in remission: Secondary | ICD-10-CM | POA: Diagnosis not present

## 2016-04-20 MED ORDER — DIVALPROEX SODIUM ER 500 MG PO TB24: 1000.0000 mg | ORAL_TABLET | Freq: Every day | ORAL | 2 refills | Status: DC

## 2016-04-20 MED ORDER — FLUOXETINE HCL 40 MG PO CAPS: 40.0000 mg | ORAL_CAPSULE | Freq: Every day | ORAL | 2 refills | Status: DC

## 2016-04-20 MED ORDER — BENZTROPINE MESYLATE 1 MG PO TABS: 1.0000 mg | ORAL_TABLET | Freq: Every day | ORAL | 2 refills | Status: DC
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
## 2016-04-25 ENCOUNTER — Ambulatory Visit (INDEPENDENT_AMBULATORY_CARE_PROVIDER_SITE_OTHER): Payer: Medicare Other | Admitting: Psychiatry

## 2016-04-25 DIAGNOSIS — F3163 Bipolar disorder, current episode mixed, severe, without psychotic features: Secondary | ICD-10-CM
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
## 2016-05-02 ENCOUNTER — Ambulatory Visit (HOSPITAL_COMMUNITY): Payer: Self-pay | Admitting: Psychiatry

## 2016-05-02 DIAGNOSIS — J320 Chronic maxillary sinusitis: Secondary | ICD-10-CM | POA: Diagnosis not present

## 2016-05-02 DIAGNOSIS — F17210 Nicotine dependence, cigarettes, uncomplicated: Secondary | ICD-10-CM | POA: Diagnosis not present

## 2016-05-02 DIAGNOSIS — J343 Hypertrophy of nasal turbinates: Secondary | ICD-10-CM | POA: Diagnosis not present

## 2016-05-09 ENCOUNTER — Ambulatory Visit (INDEPENDENT_AMBULATORY_CARE_PROVIDER_SITE_OTHER): Payer: Medicare Other | Admitting: Psychiatry

## 2016-05-09 DIAGNOSIS — Z23 Encounter for immunization: Secondary | ICD-10-CM | POA: Diagnosis not present

## 2016-05-09 DIAGNOSIS — L219 Seborrheic dermatitis, unspecified: Secondary | ICD-10-CM | POA: Diagnosis not present

## 2016-05-09 DIAGNOSIS — F3163 Bipolar disorder, current episode mixed, severe, without psychotic features: Secondary | ICD-10-CM

## 2016-05-09 DIAGNOSIS — F3160 Bipolar disorder, current episode mixed, unspecified: Secondary | ICD-10-CM | POA: Diagnosis not present

## 2016-05-15 NOTE — Progress Notes (Signed)
   THERAPIST PROGRESS NOTE  Session Time: 1:15-2:00  Participation Level:Active  Behavioral Response:CasualAlertEuthymic  Type of Therapy: Individual Therapy  Treatment Goals addressed: Anger/Irritability/Coping  Interventions:strength-based, person centered therapy, psychoeducation  Summary: Curtis Townsend is a 40 y.o. male who presents with Bipolar Disorder.   Suicidal/Homicidal:Nowithout intent/plan  Therapist Response: Pt. Continues to present as talkative, engaged in therapeutic process, mostly stable mood, energetic. Pt. Reports confidence in the changes that he is making in his life. Pt. Continues to report success of setting new boundaries in relationship with his mother and because of this he feels happier and more open. Pt. Reports as significant that he has stopped wearing hats, and does not feel need to cover his face. Pt. Reports that he has done significant work on shame and forgiving himself for his past. Pt. Reports that he feels that he is now ready to work on PTSD. Plan for next session to reassess for PTSD.  Plan: Continue with CBT.Return again in 2weeks.  Diagnosis:Axis I:Bipolar, mixed  Axis II:No diagnosis  Nancie Neas, [facility] 05/15/2016

## 2016-06-07 ENCOUNTER — Other Ambulatory Visit (INDEPENDENT_AMBULATORY_CARE_PROVIDER_SITE_OTHER): Payer: Federal, State, Local not specified - PPO

## 2016-06-07 ENCOUNTER — Ambulatory Visit (INDEPENDENT_AMBULATORY_CARE_PROVIDER_SITE_OTHER): Payer: Federal, State, Local not specified - PPO | Admitting: Internal Medicine

## 2016-06-07 ENCOUNTER — Encounter: Payer: Self-pay | Admitting: Internal Medicine

## 2016-06-07 ENCOUNTER — Ambulatory Visit (INDEPENDENT_AMBULATORY_CARE_PROVIDER_SITE_OTHER)
Admission: RE | Admit: 2016-06-07 | Discharge: 2016-06-07 | Disposition: A | Discharge status: Home / Self Care | Payer: Federal, State, Local not specified - PPO | Source: Ambulatory Visit | Attending: Internal Medicine | Admitting: Internal Medicine

## 2016-06-07 VITALS — BP 120/80 | HR 89 | Temp 98.5°F | Resp 16

## 2016-06-07 DIAGNOSIS — G63 Polyneuropathy in diseases classified elsewhere: Secondary | ICD-10-CM

## 2016-06-07 DIAGNOSIS — R0789 Other chest pain: Secondary | ICD-10-CM | POA: Diagnosis not present

## 2016-06-07 DIAGNOSIS — R61 Generalized hyperhidrosis: Secondary | ICD-10-CM

## 2016-06-07 DIAGNOSIS — R202 Paresthesia of skin: Secondary | ICD-10-CM | POA: Diagnosis not present

## 2016-06-07 DIAGNOSIS — R200 Anesthesia of skin: Secondary | ICD-10-CM | POA: Diagnosis not present

## 2016-06-07 DIAGNOSIS — E538 Deficiency of other specified B group vitamins: Secondary | ICD-10-CM | POA: Diagnosis not present

## 2016-06-07 DIAGNOSIS — R079 Chest pain, unspecified: Secondary | ICD-10-CM | POA: Diagnosis not present

## 2016-06-07 DIAGNOSIS — Z23 Encounter for immunization: Secondary | ICD-10-CM | POA: Diagnosis not present

## 2016-06-07 LAB — CBC WITH DIFFERENTIAL/PLATELET
Basophils Absolute: 0 10*3/uL (ref 0.0–0.1)
Basophils Relative: 0.7 % (ref 0.0–3.0)
EOS ABS: 0.1 10*3/uL (ref 0.0–0.7)
EOS PCT: 1.6 % (ref 0.0–5.0)
HEMATOCRIT: 48.3 % (ref 39.0–52.0)
HEMOGLOBIN: 16.3 g/dL (ref 13.0–17.0)
LYMPHS PCT: 43.3 % (ref 12.0–46.0)
Lymphs Abs: 2.9 10*3/uL (ref 0.7–4.0)
MCHC: 33.7 g/dL (ref 30.0–36.0)
MCV: 91.4 fl (ref 78.0–100.0)
MONO ABS: 0.6 10*3/uL (ref 0.1–1.0)
Monocytes Relative: 8.8 % (ref 3.0–12.0)
Neutro Abs: 3 10*3/uL (ref 1.4–7.7)
Neutrophils Relative %: 45.6 % (ref 43.0–77.0)
Platelets: 191 10*3/uL (ref 150.0–400.0)
RBC: 5.28 Mil/uL (ref 4.22–5.81)
RDW: 13.1 % (ref 11.5–15.5)
WBC: 6.6 10*3/uL (ref 4.0–10.5)

## 2016-06-07 LAB — URINALYSIS, ROUTINE W REFLEX MICROSCOPIC
BILIRUBIN URINE: NEGATIVE
HGB URINE DIPSTICK: NEGATIVE
KETONES UR: NEGATIVE
Leukocytes, UA: NEGATIVE
NITRITE: NEGATIVE
Specific Gravity, Urine: 1.02 (ref 1.000–1.030)
TOTAL PROTEIN, URINE-UPE24: NEGATIVE
URINE GLUCOSE: NEGATIVE
UROBILINOGEN UA: 0.2 (ref 0.0–1.0)
pH: 6 (ref 5.0–8.0)

## 2016-06-07 LAB — COMPREHENSIVE METABOLIC PANEL
ALBUMIN: 4.3 g/dL (ref 3.5–5.2)
ALT: 27 U/L (ref 0–53)
AST: 20 U/L (ref 0–37)
Alkaline Phosphatase: 88 U/L (ref 39–117)
BUN: 11 mg/dL (ref 6–23)
CALCIUM: 9.3 mg/dL (ref 8.4–10.5)
CHLORIDE: 103 meq/L (ref 96–112)
CO2: 30 meq/L (ref 19–32)
CREATININE: 1.06 mg/dL (ref 0.40–1.50)
GFR: 99.28 mL/min (ref >60.00)
Glucose, Bld: 87 mg/dL (ref 70–99)
Potassium: 3.9 mEq/L (ref 3.5–5.1)
Sodium: 139 mEq/L (ref 135–145)
Total Bilirubin: 0.5 mg/dL (ref 0.2–1.2)
Total Protein: 7.4 g/dL (ref 6.0–8.3)

## 2016-06-07 LAB — VITAMIN B12: Vitamin B-12: 144 pg/mL — ABNORMAL LOW (ref 211–911)

## 2016-06-07 LAB — TSH: TSH: 0.79 u[IU]/mL (ref 0.35–4.50)

## 2016-06-07 LAB — FOLATE: Folate: 15.1 ng/mL (ref >5.9)

## 2016-06-07 NOTE — Progress Notes (Signed)
Pre visit review using our clinic review tool, if applicable. No additional management support is needed unless otherwise documented below in the visit note. 

## 2016-06-07 NOTE — Progress Notes (Signed)
Subjective:  Patient ID: Curtis Townsend, male    DOB: 08/01/1975  Age: 40 y.o. MRN: [id number]  CC: Numbness   HPI Curtis Townsend presents for concerns about numbness and tingling in his hands and feet for about a month. He says it started a month ago in his feet and now has migrated to both hands symmetrically. He also complains of night sweats, fatigue and some weight loss. He denies cough, chest pain, nausea, vomiting, abdominal pain, or diarrhea. He complains of an intermittent stabbing and sharp pain in his left lateral and upper chest wall. He has not noticed any rash, lymphadenopathy, or swelling. He tells me he has quit smoking.  Outpatient Medications Prior to Visit  Medication Sig Dispense Refill  . benztropine (COGENTIN) 1 MG tablet Take 1 tablet (1 mg total) by mouth daily. 30 tablet 2  . divalproex (DEPAKOTE ER) 500 MG 24 hr tablet Take 2 tablets (1,000 mg total) by mouth daily. 69 tablet 2  . FLUoxetine (PROZAC) 40 MG capsule Take 1 capsule (40 mg total) by mouth daily. 30 capsule 2  . risperiDONE (RISPERDAL) 1 MG tablet Take 1 tablet (1 mg total) by mouth at bedtime. 30 tablet 2   No facility-administered medications prior to visit.     ROS Review of Systems  Constitutional: Positive for unexpected weight change. Negative for activity change, appetite change, chills, diaphoresis, fatigue and fever.  HENT: Negative.  Negative for trouble swallowing and voice change.   Eyes: Negative for photophobia and visual disturbance.  Respiratory: Negative for cough, choking, chest tightness, shortness of breath and stridor.   Cardiovascular: Negative.  Negative for chest pain, palpitations and leg swelling.  Gastrointestinal: Negative.  Negative for abdominal pain, constipation, diarrhea, nausea and vomiting.  Genitourinary: Negative.  Negative for difficulty urinating and dysuria.  Musculoskeletal: Negative.  Negative for back pain and myalgias.  Skin: Negative.  Negative for  pallor and wound.  Neurological: Positive for numbness. Negative for dizziness, weakness, light-headedness and headaches.  Hematological: Negative.  Negative for adenopathy. Does not bruise/bleed easily.  Psychiatric/Behavioral: Negative.     Objective:  BP 120/80 (BP Location: Left Arm, Patient Position: Sitting, Cuff Size: Normal)   Pulse 89   Temp 98.5 F (36.9 C) (Oral)   Resp 16   SpO2 98%   BP Readings from Last 3 Encounters:  06/07/16 120/80  02/22/16 118/70  04/15/15 106/70    Wt Readings from Last 3 Encounters:  02/22/16 212 lb 4 oz (96.3 kg)  04/15/15 203 lb (92.1 kg)  01/27/15 203 lb (92.1 kg)    Physical Exam  Constitutional: No distress.  HENT:  Mouth/Throat: Oropharynx is clear and moist. No oropharyngeal exudate.  Eyes: Conjunctivae are normal. Right eye exhibits no discharge. Left eye exhibits no discharge. No scleral icterus.  Neck: Normal range of motion. Neck supple. No JVD present. No tracheal deviation present. No thyromegaly present.  Cardiovascular: Normal rate, regular rhythm, normal heart sounds and intact distal pulses.  Exam reveals no gallop and no friction rub.   No murmur heard. Pulmonary/Chest: Effort normal and breath sounds normal. No stridor. No respiratory distress. He has no decreased breath sounds. He has no wheezes. He has no rhonchi. He has no rales. He exhibits no mass, no tenderness, no bony tenderness, no edema, no deformity, no swelling and no retraction.  Abdominal: Soft. Bowel sounds are normal. He exhibits no distension and no mass. There is no tenderness. There is no rebound and no guarding.  Musculoskeletal: Normal range of motion. He exhibits no edema or tenderness.  Lymphadenopathy:       Head (right side): No submandibular and no occipital adenopathy present.       Head (left side): No submandibular and no occipital adenopathy present.    He has no cervical adenopathy.    He has no axillary adenopathy.       Right: No  inguinal and no supraclavicular adenopathy present.       Left: No inguinal and no supraclavicular adenopathy present.  Neurological: He is alert. He displays no atrophy and no tremor. No cranial nerve deficit or sensory deficit. He exhibits normal muscle tone. He displays a negative Romberg sign. He displays no seizure activity. Coordination and gait normal.  Reflex Scores:      Tricep reflexes are 0 on the right side and 0 on the left side.      Bicep reflexes are 0 on the right side and 0 on the left side.      Brachioradialis reflexes are 0 on the right side and 0 on the left side.      Patellar reflexes are 0 on the right side and 0 on the left side.      Achilles reflexes are 0 on the right side and 0 on the left side. Skin: He is not diaphoretic.  Vitals reviewed.   Lab Results  Component Value Date   WBC 6.6 06/07/2016   HGB 16.3 06/07/2016   HCT 48.3 06/07/2016   PLT 191.0 06/07/2016   GLUCOSE 87 06/07/2016   CHOL 197 02/22/2016   TRIG 160.0 (H) 02/22/2016   HDL 45.00 02/22/2016   LDLCALC 120 (H) 02/22/2016   ALT 27 06/07/2016   AST 20 06/07/2016   NA 139 06/07/2016   K 3.9 06/07/2016   CL 103 06/07/2016   CREATININE 1.06 06/07/2016   BUN 11 06/07/2016   CO2 30 06/07/2016   TSH 0.79 06/07/2016   HGBA1C 5.8 02/22/2016    Ct Renal Stone Study  Result Date: 04/15/2015 CLINICAL DATA:  Right flank pain, hematuria, history of kidney stone EXAM: CT ABDOMEN AND PELVIS WITHOUT CONTRAST TECHNIQUE: Multidetector CT imaging of the abdomen and pelvis was performed following the standard protocol without IV contrast. COMPARISON:  CT abdomen pelvis of 06/30/2010 FINDINGS: The lung bases are clear. The liver is unremarkable in the unenhanced state. No calcified gallstones are seen. The pancreas is unremarkable and the pancreatic duct is not dilated. The adrenal glands and spleen are unremarkable. The stomach is decompressed. There may be tiny nonobstructing renal calculi bilaterally.  These are only seen on the coronal images and are questionable. The proximal ureters are normal in caliber. The abdominal aorta is normal in caliber. The distal ureters are normal in caliber and no distal ureteral calculus is seen. The urinary bladder is not well distended but no abnormality is seen. The prostate is within normal limits in size for age. There is feces throughout the colon. The terminal ileum and the appendix are unremarkable. The lumbar vertebrae are in normal alignment with normal disc spaces. IMPRESSION: 1. There may be tiny nonobstructing renal calculi bilaterally but no hydronephrosis is seen. No ureteral calculi are noted. 2. The appendix and terminal ileum are unremarkable. Electronically Signed   By: Ivar Drape M.D.   On: 04/15/2015 14:14    Assessment & Plan:   [name] was seen today for numbness.  Diagnoses and all orders for this visit:  Chest wall pain- his exam  and chest x-ray are normal, this is consistent with musculoskeletal chest wall pain. I recommended that he try over-the-counter symptom relief.- -     DG Chest 2 View; Future  Night sweats- chest x-ray and labs are all within normal limits. We'll monitor this in the future but at this time I don't see any metabolic or infectious causes to explain his night sweats. -     CBC with Differential/Platelet; Future -     HIV antibody; Future -     RPR; Future -     DG Chest 2 View; Future  Numbness and tingling in both hands- labs indicate that this is caused by a B12 deficiency. Will treat as below. -     Comprehensive metabolic panel; Future -     CBC with Differential/Platelet; Future -     Urinalysis, Routine w reflex microscopic (not at [facility]); Future -     TSH; Future -     Vitamin B12; Future -     Folate; Future  Vitamin B12 deficiency neuropathy ([place])- I've asked him to come in to start weekly B12 injections for the next 3 weeks. After that will continue monthly B12 injections.   I am having Mr.  Hoard maintain his risperiDONE, FLUoxetine, benztropine, divalproex, and desonide.  Meds ordered this encounter  Medications  . desonide (DESOWEN) 0.05 % ointment    Sig: APPLY 1 APPLICATION TO AFFECTED AREA ONCE DAILY AT BEDTIME EXTERNALLY 5 DAYS    Refill:  1     Follow-up: Return in about 4 weeks (around 07/05/2016).  Scarlette Calico, MD

## 2016-06-07 NOTE — Patient Instructions (Signed)
Paresthesia Introduction Paresthesia is an abnormal burning or prickling sensation. This sensation is generally felt in the hands, arms, legs, or feet. However, it may occur in any part of the body. Usually, it is not painful. The feeling may be described as:  Tingling or numbness.  Pins and needles.  Skin crawling.  Buzzing.  Limbs falling asleep.  Itching. Most people experience temporary (transient) paresthesia at some time in their lives. Paresthesia may occur when you breathe too quickly (hyperventilation). It can also occur without any apparent cause. Commonly, paresthesia occurs when pressure is placed on a nerve. The sensation quickly goes away after the pressure is removed. For some people, however, paresthesia is a long-lasting (chronic) condition that is caused by an underlying disorder. If you continue to have paresthesia, you may need further medical evaluation. Follow these instructions at home: Watch your condition for any changes. Taking the following actions may help to lessen any discomfort that you are feeling:  Avoid drinking alcohol.  Try acupuncture or massage to help relieve your symptoms.  Keep all follow-up visits as directed by your health care provider. This is important. Contact a health care provider if:  You continue to have episodes of paresthesia.  Your burning or prickling feeling gets worse when you walk.  You have pain, cramps, or dizziness.  You develop a rash. Get help right away if:  You feel weak.  You have trouble walking or moving.  You have problems with speech, understanding, or vision.  You feel confused.  You cannot control your bladder or bowel movements.  You have numbness after an injury.  You faint. This information is not intended to replace advice given to you by your health care provider. Make sure you discuss any questions you have with your health care provider. Document Released: 06/16/2002 Document Revised:  12/02/2015 Document Reviewed: 06/22/2014  2017 Elsevier

## 2016-06-08 ENCOUNTER — Encounter: Payer: Self-pay | Admitting: Internal Medicine

## 2016-06-08 DIAGNOSIS — G63 Polyneuropathy in diseases classified elsewhere: Secondary | ICD-10-CM

## 2016-06-08 DIAGNOSIS — E538 Deficiency of other specified B group vitamins: Secondary | ICD-10-CM | POA: Insufficient documentation

## 2016-06-08 LAB — RPR

## 2016-06-08 LAB — HIV ANTIBODY (ROUTINE TESTING W REFLEX): HIV 1&2 Ab, 4th Generation: NONREACTIVE

## 2016-06-12 ENCOUNTER — Ambulatory Visit (INDEPENDENT_AMBULATORY_CARE_PROVIDER_SITE_OTHER): Payer: Federal, State, Local not specified - PPO

## 2016-06-12 DIAGNOSIS — E538 Deficiency of other specified B group vitamins: Secondary | ICD-10-CM

## 2016-06-12 DIAGNOSIS — G63 Polyneuropathy in diseases classified elsewhere: Secondary | ICD-10-CM

## 2016-06-12 MED ORDER — CYANOCOBALAMIN 1000 MCG/ML IJ SOLN
1000.0000 ug | Freq: Once | INTRAMUSCULAR | Status: AC
  Administered 2016-06-12: 1000 ug via INTRAMUSCULAR

## 2016-06-12 NOTE — Addendum Note (Signed)
Addended by: Aviva Signs M on: 06/12/2016 09:05 AM   Modules accepted: Orders

## 2016-06-13 ENCOUNTER — Ambulatory Visit (INDEPENDENT_AMBULATORY_CARE_PROVIDER_SITE_OTHER): Payer: Federal, State, Local not specified - PPO | Admitting: Psychiatry

## 2016-06-13 DIAGNOSIS — F3163 Bipolar disorder, current episode mixed, severe, without psychotic features: Secondary | ICD-10-CM | POA: Diagnosis not present

## 2016-06-13 NOTE — Progress Notes (Signed)
   THERAPIST PROGRESS NOTE   Session Time: 1:10-2:00  Participation Level:Active  Behavioral Response:CasualAlertEuthymic  Type of Therapy: Individual Therapy  Treatment Goals addressed: Coping  Interventions:strength-based, person centered therapy, psychoeducation  Summary: Curtis Townsend is a 40 y.o. male who presents with Bipolar Disorder.   Suicidal/Homicidal:Nowithout intent/plan  Therapist Response: Pt. Continues to present as talkative, engaged in therapeutic process, mostly stable mood, energetic. Pt. Reports that since the last session he tragically lost another cousin this time to substance dependence. Pt. Discussed meaning of the death was that it has helped him to reflect on the importance of not wasting time in his life. Pt. Reports that he has had success with emotion regulation and has been able to think more about the consequences of his behavior and that he cannot control the behavior of others. Pt. Discussed that he has learned to use facebook as an outlet for his thoughts and feelings with distant family members. Pt. Reports that his brother visited briefly and allowed him to gather insight about the extent of his mother's illness I.e., he was able to observe her motivate for self-care and to help in cleaning of the house because she felt motivated to do so. Pt. Reports that he is actively seeking a job and is challenged by his criminal record in last two years. Pt. Was able to acknowledge that his record presents a significant challenge, but he is committed to continuing to look for a job because he understands the importance to his family. Pt. Expressed awareness that tendency to become defensive when he engages with others undermines his ability to make a good impression during interactions with potential employers.  Plan: Continue with CBT.Return again in 2weeks.  Diagnosis:Axis I:Bipolar, mixed  Axis II:No  diagnosis   Nancie Neas, [employer] 06/13/2016

## 2016-06-23 DIAGNOSIS — K080 Exfoliation of teeth due to systemic causes: Secondary | ICD-10-CM | POA: Diagnosis not present

## 2016-06-26 ENCOUNTER — Ambulatory Visit (INDEPENDENT_AMBULATORY_CARE_PROVIDER_SITE_OTHER): Payer: Federal, State, Local not specified - PPO | Admitting: Psychiatry

## 2016-06-26 DIAGNOSIS — F3163 Bipolar disorder, current episode mixed, severe, without psychotic features: Secondary | ICD-10-CM
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
## 2016-06-28 ENCOUNTER — Other Ambulatory Visit: Payer: Federal, State, Local not specified - PPO

## 2016-06-28 ENCOUNTER — Ambulatory Visit (INDEPENDENT_AMBULATORY_CARE_PROVIDER_SITE_OTHER): Payer: Federal, State, Local not specified - PPO | Admitting: Internal Medicine

## 2016-06-28 ENCOUNTER — Encounter: Payer: Self-pay | Admitting: Internal Medicine

## 2016-06-28 VITALS — BP 120/70 | HR 95 | Temp 98.4°F | Resp 16 | Ht 73.0 in | Wt 198.0 lb

## 2016-06-28 DIAGNOSIS — R197 Diarrhea, unspecified: Secondary | ICD-10-CM | POA: Diagnosis not present

## 2016-06-28 DIAGNOSIS — E538 Deficiency of other specified B group vitamins: Secondary | ICD-10-CM | POA: Insufficient documentation

## 2016-06-28 MED ORDER — CYANOCOBALAMIN 1000 MCG/ML IJ SOLN
1000.0000 ug | Freq: Once | INTRAMUSCULAR | Status: AC
  Administered 2016-06-28: 1000 ug via INTRAMUSCULAR

## 2016-06-28 NOTE — Progress Notes (Signed)
Subjective:  Patient ID: Curtis Townsend, male    DOB: 07/12/1975  Age: 40 y.o. MRN: [id number]  CC: Diarrhea   HPI Curtis Townsend presents for follow-up after a recent diagnosis of B12 deficiency with neuropathy. He is receiving weekly B12 injections. He now reports that he has had chronic, intermittent diarrhea for many years. He thought he was sensitive to lactose. He has loose bowel movements about 2 or 3 times a month. He denies abdominal pain, loss of appetite, weight loss, or rash.  Outpatient Medications Prior to Visit  Medication Sig Dispense Refill  . benztropine (COGENTIN) 1 MG tablet Take 1 tablet (1 mg total) by mouth daily. 30 tablet 2  . desonide (DESOWEN) 0.05 % ointment APPLY 1 APPLICATION TO AFFECTED AREA ONCE DAILY AT BEDTIME EXTERNALLY 5 DAYS  1  . divalproex (DEPAKOTE ER) 500 MG 24 hr tablet Take 2 tablets (1,000 mg total) by mouth daily. 69 tablet 2  . FLUoxetine (PROZAC) 40 MG capsule Take 1 capsule (40 mg total) by mouth daily. 30 capsule 2  . risperiDONE (RISPERDAL) 1 MG tablet Take 1 tablet (1 mg total) by mouth at bedtime. 30 tablet 2   No facility-administered medications prior to visit.     ROS Review of Systems  Constitutional: Negative for diaphoresis and fatigue.  HENT: Negative for trouble swallowing.   Eyes: Negative for visual disturbance.  Respiratory: Negative for cough, chest tightness, shortness of breath and stridor.   Cardiovascular: Negative for chest pain, palpitations and leg swelling.  Gastrointestinal: Positive for diarrhea. Negative for abdominal pain, anal bleeding, blood in stool, nausea and vomiting.  Endocrine: Negative.   Genitourinary: Negative.  Negative for difficulty urinating and dysuria.  Musculoskeletal: Negative.  Negative for back pain and neck pain.  Skin: Negative.  Negative for color change and rash.  Allergic/Immunologic: Negative.  Negative for immunocompromised state.  Neurological: Positive for numbness.    Hematological: Negative.  Negative for adenopathy. Does not bruise/bleed easily.  Psychiatric/Behavioral: Negative.     Objective:  BP 120/70 (BP Location: Right Arm, Patient Position: Sitting, Cuff Size: Normal)   Pulse 95   Temp 98.4 F (36.9 C) (Oral)   Resp 16   Ht 6\' 1"  (1.854 m)   Wt 198 lb (89.8 kg)   SpO2 97%   BMI 26.12 kg/m   BP Readings from Last 3 Encounters:  06/28/16 120/70  06/07/16 120/80  02/22/16 118/70    Wt Readings from Last 3 Encounters:  06/28/16 198 lb (89.8 kg)  02/22/16 212 lb 4 oz (96.3 kg)  04/15/15 203 lb (92.1 kg)    Physical Exam  Constitutional: He is oriented to person, place, and time. No distress.  HENT:  Mouth/Throat: Oropharynx is clear and moist. No oropharyngeal exudate.  Eyes: Conjunctivae are normal. Right eye exhibits no discharge. Left eye exhibits no discharge. No scleral icterus.  Neck: Normal range of motion. Neck supple. No JVD present. No tracheal deviation present. No thyromegaly present.  Cardiovascular: Normal rate, regular rhythm, normal heart sounds and intact distal pulses.  Exam reveals no gallop and no friction rub.   No murmur heard. Pulmonary/Chest: Effort normal and breath sounds normal. No stridor. No respiratory distress. He has no wheezes. He has no rales. He exhibits no tenderness.  Abdominal: Soft. Bowel sounds are normal. He exhibits no distension and no mass. There is no tenderness. There is no rebound and no guarding.  Musculoskeletal: Normal range of motion. He exhibits no edema, tenderness or deformity.  Lymphadenopathy:    He has no cervical adenopathy.  Neurological: He is oriented to person, place, and time.  Skin: Skin is warm and dry. No rash noted. He is not diaphoretic. No erythema. No pallor.  Vitals reviewed.   Lab Results  Component Value Date   WBC 6.6 06/07/2016   HGB 16.3 06/07/2016   HCT 48.3 06/07/2016   PLT 191.0 06/07/2016   GLUCOSE 87 06/07/2016   CHOL 197 02/22/2016   TRIG  160.0 (H) 02/22/2016   HDL 45.00 02/22/2016   LDLCALC 120 (H) 02/22/2016   ALT 27 06/07/2016   AST 20 06/07/2016   NA 139 06/07/2016   K 3.9 06/07/2016   CL 103 06/07/2016   CREATININE 1.06 06/07/2016   BUN 11 06/07/2016   CO2 30 06/07/2016   TSH 0.79 06/07/2016   HGBA1C 5.8 02/22/2016    Dg Chest 2 View  Result Date: 06/07/2016 CLINICAL DATA:  Left upper chest pain, night sweats, fatigue, former smoking history EXAM: CHEST  2 VIEW COMPARISON:  Chest x-ray of 02/12/2014 FINDINGS: No active infiltrate or effusion is seen. Mediastinal and hilar contours are unremarkable. The heart is within normal limits in size. No bony abnormality is seen. IMPRESSION: No active cardiopulmonary disease. Electronically Signed   By: Ivar Drape M.D.   On: 06/07/2016 16:55    Assessment & Plan:   Daiyan was seen today for diarrhea.  Diagnoses and all orders for this visit:  B12 deficiency- I will screen her for celiac disease. He will receive weekly B12 injections for 3 weeks and then B12 injections monthly. I told him the paresthesias will gradually improve over the next year. -     cyanocobalamin ((VITAMIN B-12)) injection 1,000 mcg; Inject 1 mL (1,000 mcg total) into the muscle once. -     Gliadin antibodies, serum; Future -     Tissue transglutaminase, IgA; Future -     Reticulin Antibody, IgA w reflex titer; Future  Diarrhea, unspecified type- I will screen him for celiac disease. -     Gliadin antibodies, serum; Future -     Tissue transglutaminase, IgA; Future -     Reticulin Antibody, IgA w reflex titer; Future   I am having Mr. Spaur maintain his risperiDONE, FLUoxetine, benztropine, divalproex, and desonide. We administered cyanocobalamin.  Meds ordered this encounter  Medications  . cyanocobalamin ((VITAMIN B-12)) injection 1,000 mcg     Follow-up: Return in about 4 months (around 10/27/2016).  Scarlette Calico, MD

## 2016-06-28 NOTE — Patient Instructions (Signed)
Chronic Diarrhea Diarrhea is frequent loose and watery bowel movements. It can cause you to feel weak and dehydrated. Dehydration can cause you to become tired and thirsty and to have a dry mouth, decreased urination, and dark yellow urine. Diarrhea is a sign of another problem, most often an infection that will not last long. In most cases, diarrhea lasts 2-3 days. Diarrhea that lasts longer than 4 weeks is called long-lasting (chronic) diarrhea. It is important to treat your diarrhea as directed by your health care provider to lessen or prevent future episodes of diarrhea.  CAUSES  There are many causes of chronic diarrhea. The following are some possible causes:   Gastrointestinal infections caused by viruses, bacteria, or parasites.   Food poisoning or food allergies.   Certain medicines, such as antibiotics, chemotherapy, and laxatives.   Artificial sweeteners and fructose.   Digestive disorders, such as celiac disease and inflammatory bowel diseases.   Irritable bowel syndrome.  Some disorders of the pancreas.  Disorders of the thyroid.  Reduced blood flow to the intestines.  Cancer. Sometimes the cause of chronic diarrhea is unknown. RISK FACTORS  Having a severely weakened immune system, such as from HIV or AIDS.   Taking certain types of cancer-fighting drugs (such as with chemotherapy) or other medicines.   Having had a recent organ transplant.   Having a portion of the stomach or small bowel removed.   Traveling to countries where food and water supplies are often contaminated.  SYMPTOMS  In addition to frequent, loose stools, diarrhea may cause:   Cramping.   Abdominal pain.   Nausea.   Fever.  Fatigue.  Urgent need to use the bathroom.  Loss of bowel control. DIAGNOSIS  Your health care provider must take a careful history and perform a physical exam. Tests given are based on your symptoms and history. Tests may include:   Blood or  stool tests. Three or more stool samples may be examined. Stool cultures may be used to test for bacteria or parasites.   X-rays.   A procedure in which a thin tube is inserted into the mouth or rectum (endoscopy). This allows the health care provider to look inside the intestine.  TREATMENT   Treatment is aimed at correcting the cause of the diarrhea when possible.  Diarrhea caused by an infection can often be treated with antibiotic medicines.  Diarrhea not caused by an infection may require you to take long-term medicine or have surgery. Specific treatment should be discussed with your health care provider.  If the cause cannot be determined, treatment aims to relieve symptoms and prevent dehydration. Serious health problems can occur if you do not maintain proper fluid levels. Treatment may include:  Taking an oral rehydration solution (ORS).  Not drinking beverages that contain caffeine (such as tea, coffee, and soft drinks).  Not drinking alcohol.  Maintaining well-balanced nutrition to help you recover faster. HOME CARE INSTRUCTIONS   Drink enough fluids to keep urine clear or pale yellow. Drink 1 cup (8 oz) of fluid for each diarrhea episode. Avoid fluids that contain simple sugars, fruit juices, whole milk products, and sodas. Hydrate with an ORS. You may purchase the ORS or prepare it at home by mixing the following ingredients together:  ?-? tsp (1.7-3 ? mL) table salt.   tsp (3  mL) baking soda.  ? tsp (1.7 mL) salt substitute containing potassium chloride.  1 ? tbsp (20 mL) sugar.  4.2 c (1 L) of water.  Certain foods and beverages may increase the speed at which food moves through the gastrointestinal (GI) tract. These foods and beverages should be avoided. They include:  Caffeinated and alcoholic beverages.  High-fiber foods, such as raw fruits and vegetables, nuts, seeds, and whole grain breads and cereals.  Foods and beverages sweetened with sugar  alcohols, such as xylitol, sorbitol, and mannitol.   Some foods may be well tolerated and may help thicken stool. These include:  Starchy foods, such as rice, toast, pasta, low-sugar cereal, oatmeal, grits, baked potatoes, crackers, and bagels.  Bananas.  Applesauce.  Add probiotic-rich foods to help increase healthy bacteria in the GI tract. These include yogurt and fermented milk products.  Wash your hands well after each diarrhea episode.  Only take over-the-counter or prescription medicines as directed by your health care provider.  Take a warm bath to relieve any burning or pain from frequent diarrhea episodes. SEEK MEDICAL CARE IF:   You are not urinating as often.  Your urine is a dark color.  You become very tired or dizzy.  You have severe pain in the abdomen or rectum.  Your have blood or pus in your stools.  Your stools look black and tarry. SEEK IMMEDIATE MEDICAL CARE IF:   You are unable to keep fluids down.  You have persistent vomiting.  You have blood in your stool.  Your stools are black and tarry.  You do not urinate in 6-8 hours, or there is only a small amount of very dark urine.  You have abdominal pain that increases or localizes.  You have weakness, dizziness, confusion, or lightheadedness.  You have a severe headache.  Your diarrhea gets worse or does not get better.  You have a fever or persistent symptoms for more than 2-3 days.  You have a fever and your symptoms suddenly get worse. MAKE SURE YOU:   Understand these instructions.  Will watch your condition.  Will get help right away if you are not doing well or get worse. This information is not intended to replace advice given to you by your health care provider. Make sure you discuss any questions you have with your health care provider. Document Released: 09/16/2003 Document Revised: 07/01/2013 Document Reviewed: 12/19/2012 Elsevier Interactive Patient Education  2017  [employer].

## 2016-06-28 NOTE — Progress Notes (Signed)
Pre visit review using our clinic review tool, if applicable. No additional management support is needed unless otherwise documented below in the visit note. 

## 2016-06-29 ENCOUNTER — Encounter: Payer: Self-pay | Admitting: Internal Medicine

## 2016-06-29 LAB — TISSUE TRANSGLUTAMINASE, IGA: TISSUE TRANSGLUTAMINASE AB, IGA: 1 U/mL (ref <4)

## 2016-06-29 LAB — GLIADIN ANTIBODIES, SERUM
GLIADIN IGA: 9 U (ref <20)
GLIADIN IGG: 2 U (ref <20)

## 2016-07-01 LAB — RETICULIN ANTIBODIES, IGA W TITER: RETICULIN AB, IGA: NEGATIVE

## 2016-07-04 ENCOUNTER — Encounter: Payer: Self-pay | Admitting: Internal Medicine

## 2016-07-05 ENCOUNTER — Ambulatory Visit: Payer: Self-pay

## 2016-07-06 ENCOUNTER — Ambulatory Visit (INDEPENDENT_AMBULATORY_CARE_PROVIDER_SITE_OTHER): Payer: Federal, State, Local not specified - PPO | Admitting: General Practice

## 2016-07-06 DIAGNOSIS — E538 Deficiency of other specified B group vitamins: Secondary | ICD-10-CM | POA: Diagnosis not present

## 2016-07-06 MED ORDER — CYANOCOBALAMIN 1000 MCG/ML IJ SOLN
1000.0000 ug | Freq: Once | INTRAMUSCULAR | Status: AC
  Administered 2016-07-06: 1000 ug via INTRAMUSCULAR

## 2016-07-12 ENCOUNTER — Ambulatory Visit (HOSPITAL_COMMUNITY): Payer: Self-pay | Admitting: Psychiatry

## 2016-07-14 DIAGNOSIS — L219 Seborrheic dermatitis, unspecified: Secondary | ICD-10-CM | POA: Diagnosis not present

## 2016-07-14 DIAGNOSIS — Z23 Encounter for immunization: Secondary | ICD-10-CM | POA: Diagnosis not present

## 2016-07-14 DIAGNOSIS — L649 Androgenic alopecia, unspecified: Secondary | ICD-10-CM | POA: Diagnosis not present

## 2016-07-16 ENCOUNTER — Emergency Department (HOSPITAL_COMMUNITY)
Admission: EM | Admit: 2016-07-16 | Discharge: 2016-07-16 | Disposition: A | Discharge status: Home / Self Care | Payer: Federal, State, Local not specified - PPO | Attending: Emergency Medicine | Admitting: Emergency Medicine

## 2016-07-16 ENCOUNTER — Encounter (HOSPITAL_COMMUNITY): Payer: Self-pay

## 2016-07-16 DIAGNOSIS — E119 Type 2 diabetes mellitus without complications: Secondary | ICD-10-CM | POA: Diagnosis not present

## 2016-07-16 DIAGNOSIS — R1031 Right lower quadrant pain: Secondary | ICD-10-CM | POA: Diagnosis not present

## 2016-07-16 DIAGNOSIS — F17210 Nicotine dependence, cigarettes, uncomplicated: Secondary | ICD-10-CM | POA: Insufficient documentation

## 2016-07-16 DIAGNOSIS — R109 Unspecified abdominal pain: Secondary | ICD-10-CM

## 2016-07-16 LAB — CBC WITH DIFFERENTIAL/PLATELET
BASOS ABS: 0 10*3/uL (ref 0.0–0.1)
BASOS PCT: 0 %
EOS ABS: 0.2 10*3/uL (ref 0.0–0.7)
EOS PCT: 2 %
HCT: 46.3 % (ref 39.0–52.0)
Hemoglobin: 16.3 g/dL (ref 13.0–17.0)
Lymphocytes Relative: 57 %
Lymphs Abs: 4.3 10*3/uL — ABNORMAL HIGH (ref 0.7–4.0)
MCH: 31.1 pg (ref 26.0–34.0)
MCHC: 35.2 g/dL (ref 30.0–36.0)
MCV: 88.4 fL (ref 78.0–100.0)
MONO ABS: 0.5 10*3/uL (ref 0.1–1.0)
MONOS PCT: 7 %
Neutro Abs: 2.6 10*3/uL (ref 1.7–7.7)
Neutrophils Relative %: 34 %
PLATELETS: 194 10*3/uL (ref 150–400)
RBC: 5.24 MIL/uL (ref 4.22–5.81)
RDW: 12.4 % (ref 11.5–15.5)
WBC: 7.6 10*3/uL (ref 4.0–10.5)

## 2016-07-16 LAB — URINALYSIS, ROUTINE W REFLEX MICROSCOPIC
BILIRUBIN URINE: NEGATIVE
Glucose, UA: NEGATIVE mg/dL
Hgb urine dipstick: NEGATIVE
KETONES UR: NEGATIVE mg/dL
Leukocytes, UA: NEGATIVE
NITRITE: NEGATIVE
Protein, ur: NEGATIVE mg/dL
Specific Gravity, Urine: 1.025 (ref 1.005–1.030)
pH: 6 (ref 5.0–8.0)

## 2016-07-16 LAB — COMPREHENSIVE METABOLIC PANEL
ALBUMIN: 4.3 g/dL (ref 3.5–5.0)
ALK PHOS: 83 U/L (ref 38–126)
ALT: 32 U/L (ref 17–63)
ANION GAP: 9 (ref 5–15)
AST: 25 U/L (ref 15–41)
BILIRUBIN TOTAL: 0.7 mg/dL (ref 0.3–1.2)
BUN: 13 mg/dL (ref 6–20)
CALCIUM: 9.1 mg/dL (ref 8.9–10.3)
CO2: 24 mmol/L (ref 22–32)
CREATININE: 1.03 mg/dL (ref 0.61–1.24)
Chloride: 101 mmol/L (ref 101–111)
GFR calc Af Amer: >60 mL/min (ref >60)
GFR calc non Af Amer: >60 mL/min (ref >60)
GLUCOSE: 84 mg/dL (ref 65–99)
Potassium: 4.2 mmol/L (ref 3.5–5.1)
SODIUM: 134 mmol/L — AB (ref 135–145)
TOTAL PROTEIN: 7.6 g/dL (ref 6.5–8.1)

## 2016-07-16 MED ORDER — HYDROCODONE-ACETAMINOPHEN 5-325 MG PO TABS: 1.0000 | ORAL_TABLET | Freq: Four times a day (QID) | ORAL | 0 refills | Status: DC | PRN

## 2016-07-16 MED ORDER — OXYCODONE-ACETAMINOPHEN 5-325 MG PO TABS
1.0000 | ORAL_TABLET | ORAL | Status: DC | PRN
  Administered 2016-07-16: 1 via ORAL
  Filled 2016-07-16: qty 1

## 2016-07-16 MED ORDER — SODIUM CHLORIDE 0.9 % IV BOLUS (SEPSIS)
1000.0000 mL | Freq: Once | INTRAVENOUS | Status: AC
  Administered 2016-07-16: 1000 mL via INTRAVENOUS

## 2016-07-16 MED ORDER — KETOROLAC TROMETHAMINE 15 MG/ML IJ SOLN
15.0000 mg | Freq: Once | INTRAMUSCULAR | Status: AC
  Administered 2016-07-16: 15 mg via INTRAVENOUS
  Filled 2016-07-16: qty 1

## 2016-07-16 NOTE — ED Provider Notes (Signed)
[facility] DEPT Provider Note   CSN: [id number] Arrival date & time: 07/16/16  1757     History   Chief Complaint Chief Complaint  Patient presents with  . Flank Pain    HPI Curtis Townsend is a 41 y.o. male.  The history is provided by the patient. No language interpreter was used.  Flank Pain    Curtis Townsend is a 41 y.o. male who presents to the Emergency Department complaining of flank pain.  Reports 1 day of intermittent right flank pain with associated dysuria and burning with urination. Pain in his flank is severe and intermittent in nature. It is similar to prior episodes of kidney stones. No fevers, vomiting. He does have nausea at times. Last kidney stone was several years ago and did not need intervention to pass. Past Medical History:  Diagnosis Date  . Alcoholism ([place])   . Anxiety   . Bipolar 1 disorder ([place])   . Depression   . Depression   . Diabetes ([place])   . History of chicken pox   . History of kidney stones   . Hyperlipemia    no current med.  . IBS (irritable bowel syndrome)   . Lipoma of back 05/2012   right  . Seasonal allergies   . Wears partial dentures    upper    Patient Active Problem List   Diagnosis Date Noted  . B12 deficiency 06/28/2016  . Diarrhea 06/28/2016  . Vitamin B12 deficiency neuropathy ([place]) 06/08/2016  . Hyperglycemia 02/22/2016  . Routine general medical examination at a health care facility 01/27/2015  . Chronic shoulder pain 04/22/2014  . Bipolar I disorder ([place]) 12/29/2013  . Suicidal ideation 12/29/2013  . Neurosis, posttraumatic 12/29/2013  . Bipolar affective disorder, depressed ([place]) 08/04/2013    Past Surgical History:  Procedure Laterality Date  . LIPOMA EXCISION  06/10/2012   Procedure: EXCISION LIPOMA;  Surgeon: Ralene Ok, MD;  Location: [facility];  Service: General;  Laterality: Right;  excision of back lipoma on right 3x3cm       Home Medications    Prior to  Admission medications   Medication Sig Start Date End Date Taking? Authorizing Provider  B Complex Vitamins (VITAMIN B-COMPLEX 100) INJ Inject 1 each as directed every 28 (twenty-eight) days.   Yes Historical Provider, MD  benztropine (COGENTIN) 1 MG tablet Take 1 tablet (1 mg total) by mouth daily. 04/20/16  Yes [name], MD  desonide (DESOWEN) 0.05 % ointment APPLY 1 APPLICATION TO AFFECTED AREA ONCE DAILY AT BEDTIME EXTERNALLY 5 DAYS 05/09/16  Yes Historical Provider, MD  divalproex (DEPAKOTE ER) 500 MG 24 hr tablet Take 2 tablets (1,000 mg total) by mouth daily. 04/20/16 04/20/17 Yes [name], MD  FLUoxetine (PROZAC) 40 MG capsule Take 1 capsule (40 mg total) by mouth daily. 04/20/16  Yes [name], MD  risperiDONE (RISPERDAL) 1 MG tablet Take 1 tablet (1 mg total) by mouth at bedtime. 11/08/15 11/07/16 Yes [name], MD  HYDROcodone-acetaminophen (NORCO/VICODIN) 5-325 MG tablet Take 1 tablet by mouth every 6 (six) hours as needed. 07/16/16   Quintella Reichert, MD    Family History Family History  Problem Relation Age of Onset  . Stroke Mother   . Hypertension Mother   . Diabetes Mother   . Hypertension Father   . Diabetes Father   . Cancer Paternal Uncle     Prostate Cancer    Social History Social History  Substance Use  Topics  . Smoking status: Current Every Day Smoker    Packs/day: 1.00    Years: 16.00    Types: Cigarettes  . Smokeless tobacco: Never Used     Comment: 1-2 cig./month  . Alcohol use No     Comment: pt states daily vodka "till bottle empty"     Allergies   Patient has no known allergies.   Review of Systems Review of Systems  Genitourinary: Positive for flank pain.  All other systems reviewed and are negative.    Physical Exam Updated Vital Signs BP 118/79   Pulse 62   Temp 97.6 F (36.4 C) (Oral)   Resp 18   Wt 207 lb (93.9 kg)   SpO2 100%   BMI 27.31 kg/m   Physical Exam  Constitutional: He is oriented to person, place, and  time. He appears well-developed and well-nourished.  HENT:  Head: Normocephalic and atraumatic.  Cardiovascular: Normal rate and regular rhythm.   No murmur heard. Pulmonary/Chest: Effort normal and breath sounds normal. No respiratory distress.  Abdominal: Soft. There is no rebound and no guarding.  Mild right lower quadrant and CVA tenderness  Musculoskeletal: He exhibits no edema or tenderness.  Neurological: He is alert and oriented to person, place, and time.  Skin: Skin is warm and dry.  Psychiatric: He has a normal mood and affect. His behavior is normal.  Nursing note and vitals reviewed.    ED Treatments / Results  Labs (all labs ordered are listed, but only abnormal results are displayed) Labs Reviewed  COMPREHENSIVE METABOLIC PANEL - Abnormal; Notable for the following:       Result Value   Sodium 134 (*)    All other components within normal limits  CBC WITH DIFFERENTIAL/PLATELET - Abnormal; Notable for the following:    Lymphs Abs 4.3 (*)    All other components within normal limits  URINALYSIS, ROUTINE W REFLEX MICROSCOPIC    EKG  EKG Interpretation None       Radiology No results found.  Procedures Procedures (including critical care time)  Medications Ordered in ED Medications  oxyCODONE-acetaminophen (PERCOCET/ROXICET) 5-325 MG per tablet 1 tablet (1 tablet Oral Given 07/16/16 1820)  ketorolac (TORADOL) 15 MG/ML injection 15 mg (15 mg Intravenous Given 07/16/16 2157)  sodium chloride 0.9 % bolus 1,000 mL (0 mLs Intravenous Stopped 07/16/16 2316)     Initial Impression / Assessment and Plan / ED Course  I have reviewed the triage vital signs and the nursing notes.  Pertinent labs & imaging results that were available during my care of the patient were reviewed by me and considered in my medical decision making (see chart for details).  Clinical Course   Patient here for evaluation of dysuria and right flank pain. His pain resolved in the emergency  department after Toradol administration. UA not consistent with UTI. Will treat for possible renal colic with oral hydration and, pain control, outpatient follow-up. Current clinical picture is not consistent with appendicitis or serious bacterial infection. Discussed with patient important of close return precautions.  Final Clinical Impressions(s) / ED Diagnoses   Final diagnoses:  Right flank pain    New Prescriptions Discharge Medication List as of 07/16/2016 11:09 PM    START taking these medications   Details  HYDROcodone-acetaminophen (NORCO/VICODIN) 5-325 MG tablet Take 1 tablet by mouth every 6 (six) hours as needed., Starting Sun 07/16/2016, Print         Quintella Reichert, MD 07/17/16 [phone]

## 2016-07-16 NOTE — ED Triage Notes (Signed)
Pt has hx of kidney stones. Pt started having rt flank pain yesterday. Sudden strong pain an hour ago.  Burning with urination.

## 2016-07-20 ENCOUNTER — Ambulatory Visit (HOSPITAL_COMMUNITY): Payer: Self-pay | Admitting: Psychiatry

## 2016-07-31 ENCOUNTER — Ambulatory Visit (INDEPENDENT_AMBULATORY_CARE_PROVIDER_SITE_OTHER): Payer: Federal, State, Local not specified - PPO | Admitting: Psychiatry

## 2016-07-31 ENCOUNTER — Encounter (HOSPITAL_COMMUNITY): Payer: Self-pay | Admitting: Psychiatry

## 2016-07-31 VITALS — BP 122/76 | HR 99 | Ht 73.0 in | Wt 208.0 lb

## 2016-07-31 DIAGNOSIS — F17210 Nicotine dependence, cigarettes, uncomplicated: Secondary | ICD-10-CM

## 2016-07-31 DIAGNOSIS — Z8249 Family history of ischemic heart disease and other diseases of the circulatory system: Secondary | ICD-10-CM

## 2016-07-31 DIAGNOSIS — Z823 Family history of stroke: Secondary | ICD-10-CM

## 2016-07-31 DIAGNOSIS — F3163 Bipolar disorder, current episode mixed, severe, without psychotic features: Secondary | ICD-10-CM

## 2016-07-31 DIAGNOSIS — Z79899 Other long term (current) drug therapy: Secondary | ICD-10-CM

## 2016-07-31 DIAGNOSIS — Z833 Family history of diabetes mellitus: Secondary | ICD-10-CM | POA: Diagnosis not present

## 2016-07-31 MED ORDER — DIVALPROEX SODIUM ER 500 MG PO TB24: 1000.0000 mg | ORAL_TABLET | Freq: Every day | ORAL | 2 refills | Status: DC

## 2016-07-31 MED ORDER — RISPERIDONE 1 MG PO TABS: 1.0000 mg | ORAL_TABLET | Freq: Every day | ORAL | 2 refills | Status: DC

## 2016-07-31 MED ORDER — BENZTROPINE MESYLATE 1 MG PO TABS: 1.0000 mg | ORAL_TABLET | Freq: Every day | ORAL | 2 refills | Status: DC

## 2016-07-31 MED ORDER — FLUOXETINE HCL 40 MG PO CAPS: 40.0000 mg | ORAL_CAPSULE | Freq: Every day | ORAL | 2 refills | Status: DC
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
## 2016-08-07 ENCOUNTER — Telehealth: Payer: Self-pay

## 2016-08-07 ENCOUNTER — Ambulatory Visit (INDEPENDENT_AMBULATORY_CARE_PROVIDER_SITE_OTHER): Payer: Federal, State, Local not specified - PPO | Admitting: Psychiatry

## 2016-08-07 ENCOUNTER — Ambulatory Visit (INDEPENDENT_AMBULATORY_CARE_PROVIDER_SITE_OTHER): Payer: Federal, State, Local not specified - PPO

## 2016-08-07 DIAGNOSIS — Z79899 Other long term (current) drug therapy: Secondary | ICD-10-CM

## 2016-08-07 DIAGNOSIS — F3160 Bipolar disorder, current episode mixed, unspecified: Secondary | ICD-10-CM | POA: Diagnosis not present

## 2016-08-07 DIAGNOSIS — E538 Deficiency of other specified B group vitamins: Secondary | ICD-10-CM

## 2016-08-07 MED ORDER — CYANOCOBALAMIN 1000 MCG/ML IJ SOLN
1000.0000 ug | Freq: Once | INTRAMUSCULAR | Status: AC
  Administered 2016-08-07: 1000 ug via INTRAMUSCULAR

## 2016-08-07 NOTE — Telephone Encounter (Signed)
error 

## 2016-08-07 NOTE — Telephone Encounter (Signed)
Per dr Ronnald Ramp, patient needs to have b12 lab to decide on further b12 injection instructions and to see if patient needs to have nerve test done----b12 lab ordered entered, patient advised to have lab done, we will call patient back with results and further instructions
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
## 2016-08-14 ENCOUNTER — Ambulatory Visit (HOSPITAL_COMMUNITY): Payer: Self-pay | Admitting: Psychiatry

## 2016-08-21 ENCOUNTER — Telehealth (HOSPITAL_COMMUNITY): Payer: Self-pay | Admitting: Psychiatry

## 2016-08-21 ENCOUNTER — Ambulatory Visit (INDEPENDENT_AMBULATORY_CARE_PROVIDER_SITE_OTHER): Payer: Federal, State, Local not specified - PPO | Admitting: Psychiatry

## 2016-08-21 DIAGNOSIS — F3163 Bipolar disorder, current episode mixed, severe, without psychotic features: Secondary | ICD-10-CM | POA: Diagnosis not present

## 2016-08-25 NOTE — Progress Notes (Signed)
   THERAPIST PROGRESS NOTE  Session Time: 1:10-2:00  Participation Level:Active  Behavioral Response:CasualAlertEuthymic  Type of Therapy: Individual Therapy  Treatment Goals addressed: Coping; AngerManagement  Interventions:strength-based, person centered therapy, psychoeducation  Summary: Curtis Townsend is a 41 y.o. male who presents with Bipolar Disorder.   Suicidal/Homicidal:Nowithout intent/plan  Therapist Response: Pt. Continues to present with improved mood. Pt. Presented with his infant son. Pt. Shared that he is becoming more secure with his role of caregiver and is beginning to feel pride as opposed to shame with the role. Pt. Discussed recent interactions with his brother who praised him for being a good father and observed that his children were happy. Prior to this conversation Pt. Had felt in competition with his brother and at times inferior because his brother works in a professional capacity. Pt. Was able to recognize and verbalize  the strengths in his roles as husband and father. Pt. Discussed that his mother will have surgery soon and he has belief that his mother will always be with him and is unsure that she will ever be able to live independently. Pt. Discussed what that means for him and whether or not it is possible to exercise more appropriate/healthier boundaries with her. Significant part of the session involved assessing pt.'s values regarding work and family, and what healthy boundaries with his mother would look like.  Plan: Continue with CBT.Return again in 2weeks.  Diagnosis:Axis I:Bipolar, mixed  Axis II:No diagnosis   Nancie Neas, [facility] 08/25/2016

## 2016-08-28 ENCOUNTER — Ambulatory Visit (HOSPITAL_COMMUNITY): Payer: Self-pay | Admitting: Psychiatry

## 2016-09-04 ENCOUNTER — Ambulatory Visit (INDEPENDENT_AMBULATORY_CARE_PROVIDER_SITE_OTHER): Payer: Federal, State, Local not specified - PPO | Admitting: Psychiatry

## 2016-09-04 DIAGNOSIS — F314 Bipolar disorder, current episode depressed, severe, without psychotic features: Secondary | ICD-10-CM

## 2016-09-06 NOTE — Progress Notes (Signed)
   THERAPIST PROGRESS NOTE  Session Time: 1:10-2:00  Participation Level:Active  Behavioral Response:CasualAlertEuthymic  Type of Therapy: Individual Therapy  Treatment Goals addressed: Coping; AngerManagement  Interventions:strength-based, person centered therapy, psychoeducation  Summary: Curtis Townsend is a 41 y.o. male who presents with Bipolar Disorder.   Suicidal/Homicidal:Nowithout intent/plan  Therapist Response: Pt. Presents as talkative, engaged in the therapeutic process. Pt. Discussed patterns of relationships with his wife's family that cause him to become angry and withdraw. Pt. Discussed that his wife's family have never liked him or accepted him and that he feels tired of seeking acceptance from them. Session focused on developing core sense of self so that Pt. Will be better able to deflect his in-laws comments toward him and not cause frustration and anger. Pt. Discussed that significant consequence of the interactions is that it interferes in relationship with his wife who wants him to get along with her family. Session focused on developing Pt.'s self-awareness of how his behaviors contribute to relational dynamic and strengthening understanding of core strengths and values independent of his in-laws opinions of him.  Plan: Continue with CBT.Return again in 2weeks.  Diagnosis:Axis I:Bipolar, mixed  Axis II:No diagnosis    Nancie Neas, [facility] 09/06/2016

## 2016-09-26 ENCOUNTER — Ambulatory Visit (HOSPITAL_COMMUNITY): Payer: Self-pay | Admitting: Psychiatry

## 2016-10-02 ENCOUNTER — Ambulatory Visit (HOSPITAL_COMMUNITY): Payer: Federal, State, Local not specified - PPO | Admitting: Psychiatry

## 2016-10-02 DIAGNOSIS — F314 Bipolar disorder, current episode depressed, severe, without psychotic features: Secondary | ICD-10-CM
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
## 2016-10-09 ENCOUNTER — Emergency Department (HOSPITAL_COMMUNITY)
Admission: EM | Admit: 2016-10-09 | Discharge: 2016-10-09 | Disposition: A | Discharge status: Home / Self Care | Payer: Federal, State, Local not specified - PPO | Attending: Emergency Medicine | Admitting: Emergency Medicine

## 2016-10-09 ENCOUNTER — Encounter (HOSPITAL_COMMUNITY): Payer: Self-pay | Admitting: Emergency Medicine

## 2016-10-09 ENCOUNTER — Emergency Department (HOSPITAL_COMMUNITY): Payer: Federal, State, Local not specified - PPO

## 2016-10-09 DIAGNOSIS — Y9389 Activity, other specified: Secondary | ICD-10-CM | POA: Insufficient documentation

## 2016-10-09 DIAGNOSIS — R072 Precordial pain: Secondary | ICD-10-CM | POA: Diagnosis not present

## 2016-10-09 DIAGNOSIS — Y999 Unspecified external cause status: Secondary | ICD-10-CM | POA: Diagnosis not present

## 2016-10-09 DIAGNOSIS — E119 Type 2 diabetes mellitus without complications: Secondary | ICD-10-CM | POA: Diagnosis not present

## 2016-10-09 DIAGNOSIS — Y929 Unspecified place or not applicable: Secondary | ICD-10-CM | POA: Insufficient documentation

## 2016-10-09 DIAGNOSIS — S299XXA Unspecified injury of thorax, initial encounter: Secondary | ICD-10-CM | POA: Diagnosis not present

## 2016-10-09 DIAGNOSIS — S279XXA Injury of unspecified intrathoracic organ, initial encounter: Secondary | ICD-10-CM | POA: Diagnosis not present

## 2016-10-09 DIAGNOSIS — F17210 Nicotine dependence, cigarettes, uncomplicated: Secondary | ICD-10-CM | POA: Diagnosis not present

## 2016-10-09 DIAGNOSIS — R0789 Other chest pain: Secondary | ICD-10-CM

## 2016-10-09 DIAGNOSIS — T7411XA Adult physical abuse, confirmed, initial encounter: Secondary | ICD-10-CM | POA: Diagnosis not present

## 2016-10-09 MED ORDER — ACETAMINOPHEN 500 MG PO TABS
1000.0000 mg | ORAL_TABLET | Freq: Once | ORAL | Status: AC
  Administered 2016-10-09: 1000 mg via ORAL
  Filled 2016-10-09: qty 2

## 2016-10-09 MED ORDER — KETOROLAC TROMETHAMINE 30 MG/ML IJ SOLN
30.0000 mg | Freq: Once | INTRAMUSCULAR | Status: AC
  Administered 2016-10-09: 30 mg via INTRAMUSCULAR
  Filled 2016-10-09: qty 1
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
## 2016-10-10 ENCOUNTER — Ambulatory Visit (HOSPITAL_COMMUNITY): Payer: Self-pay | Admitting: Psychiatry

## 2016-10-17 ENCOUNTER — Ambulatory Visit (HOSPITAL_COMMUNITY): Payer: Self-pay | Admitting: Psychiatry

## 2016-10-23 ENCOUNTER — Ambulatory Visit (HOSPITAL_COMMUNITY): Payer: Self-pay | Admitting: Psychiatry

## 2016-10-30 ENCOUNTER — Ambulatory Visit (HOSPITAL_COMMUNITY): Payer: Self-pay | Admitting: Psychiatry

## 2016-10-31 ENCOUNTER — Ambulatory Visit (HOSPITAL_COMMUNITY): Payer: Self-pay | Admitting: Psychiatry

## 2016-11-02 ENCOUNTER — Ambulatory Visit (HOSPITAL_COMMUNITY): Payer: Self-pay | Admitting: Psychiatry

## 2016-11-05 ENCOUNTER — Other Ambulatory Visit (HOSPITAL_COMMUNITY): Payer: Self-pay | Admitting: Psychiatry

## 2016-11-05 DIAGNOSIS — F3163 Bipolar disorder, current episode mixed, severe, without psychotic features: Secondary | ICD-10-CM

## 2016-11-06 ENCOUNTER — Ambulatory Visit (HOSPITAL_COMMUNITY): Payer: Self-pay | Admitting: Psychiatry

## 2016-11-09 ENCOUNTER — Other Ambulatory Visit (HOSPITAL_COMMUNITY): Payer: Self-pay

## 2016-11-09 NOTE — Telephone Encounter (Signed)
Medication refill request - Fax received from patient's [facility] on [address]. for Risperdal.  Patient no showed 11/02/16 and canceled 10/30/16 with no current appointment scheduled.

## 2016-11-13 ENCOUNTER — Ambulatory Visit (INDEPENDENT_AMBULATORY_CARE_PROVIDER_SITE_OTHER): Payer: Federal, State, Local not specified - PPO | Admitting: Psychiatry

## 2016-11-13 DIAGNOSIS — F314 Bipolar disorder, current episode depressed, severe, without psychotic features: Secondary | ICD-10-CM | POA: Diagnosis not present

## 2016-11-14 NOTE — Telephone Encounter (Signed)
I denied this request last week, I called patient and lvm for him to call back and schedule an appointment

## 2016-11-15 NOTE — Telephone Encounter (Signed)
Medication refill - Met with [name] who authorized a one time refill of patient's requested Risperdal after pt. canceled appt. 10/30/16 and then no showed 11/02/16 with no current appt. scheduled.  New order with instruction evaluation required for any further refills e-scribed to patient's [facility] on [facility] [address] as instructed by Dr.Arfeen.
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
## 2016-11-27 ENCOUNTER — Ambulatory Visit (INDEPENDENT_AMBULATORY_CARE_PROVIDER_SITE_OTHER): Payer: Federal, State, Local not specified - PPO | Admitting: Psychiatry

## 2016-11-27 DIAGNOSIS — F314 Bipolar disorder, current episode depressed, severe, without psychotic features: Secondary | ICD-10-CM | POA: Diagnosis not present
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
## 2016-12-11 ENCOUNTER — Ambulatory Visit (INDEPENDENT_AMBULATORY_CARE_PROVIDER_SITE_OTHER): Payer: Federal, State, Local not specified - PPO | Admitting: Psychiatry

## 2016-12-11 DIAGNOSIS — F3160 Bipolar disorder, current episode mixed, unspecified: Secondary | ICD-10-CM

## 2016-12-11 DIAGNOSIS — F314 Bipolar disorder, current episode depressed, severe, without psychotic features: Secondary | ICD-10-CM
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
## 2016-12-25 ENCOUNTER — Ambulatory Visit (HOSPITAL_COMMUNITY): Payer: Self-pay | Admitting: Psychiatry

## 2016-12-26 ENCOUNTER — Ambulatory Visit (INDEPENDENT_AMBULATORY_CARE_PROVIDER_SITE_OTHER): Payer: Federal, State, Local not specified - PPO | Admitting: Family

## 2016-12-26 ENCOUNTER — Encounter: Payer: Self-pay | Admitting: Family

## 2016-12-26 VITALS — BP 112/80 | HR 97 | Temp 99.0°F | Resp 16 | Ht 73.0 in | Wt 202.0 lb

## 2016-12-26 DIAGNOSIS — R059 Cough, unspecified: Secondary | ICD-10-CM

## 2016-12-26 DIAGNOSIS — R05 Cough: Secondary | ICD-10-CM | POA: Diagnosis not present

## 2016-12-26 MED ORDER — HYDROCODONE-HOMATROPINE 5-1.5 MG/5ML PO SYRP: 5.0000 mL | ORAL_SOLUTION | Freq: Three times a day (TID) | ORAL | 0 refills | Status: DC | PRN

## 2016-12-26 MED ORDER — AZITHROMYCIN 250 MG PO TABS: ORAL_TABLET | ORAL | 0 refills | Status: DC

## 2016-12-26 NOTE — Patient Instructions (Signed)
Thank you for choosing [facility].  SUMMARY AND INSTRUCTIONS:  Medication:  Your prescription(s) have been submitted to your pharmacy or been printed and provided for you. Please take as directed and contact our office if you believe you are having problem(s) with the medication(s) or have any questions.  Follow up:  If your symptoms worsen or fail to improve, please contact our office for further instruction, or in case of emergency go directly to the emergency room at the closest medical facility.    General Recommendations:    Please drink plenty of fluids.  Get plenty of rest   Sleep in humidified air  Use saline nasal sprays  Netti pot   OTC Medications:  Decongestants - helps relieve congestion   Flonase (generic fluticasone) or Nasacort (generic triamcinolone) - please make sure to use the "cross-over" technique at a 45 degree angle towards the opposite eye as opposed to straight up the nasal passageway.   Sudafed (generic pseudoephedrine - Note this is the one that is available behind the pharmacy counter); Products with phenylephrine (-PE) may also be used but is often not as effective as pseudoephedrine.   If you have HIGH BLOOD PRESSURE - Coricidin HBP; AVOID any product that is -D as this contains pseudoephedrine which may increase your blood pressure.  Afrin (oxymetazoline) every 6-8 hours for up to 3 days.   Allergies - helps relieve runny nose, itchy eyes and sneezing   Claritin (generic loratidine), Allegra (fexofenidine), or Zyrtec (generic cyrterizine) for runny nose. These medications should not cause drowsiness.  Note - Benadryl (generic diphenhydramine) may be used however may cause drowsiness  Cough -   Delsym or Robitussin (generic dextromethorphan)  Expectorants - helps loosen mucus to ease removal   Mucinex (generic guaifenesin) as directed on the package.  Headaches / General Aches   Tylenol (generic acetaminophen) - DO NOT  EXCEED 3 grams (3,000 mg) in a 24 hour time period  Advil/Motrin (generic ibuprofen)   Sore Throat -   Salt water gargle   Chloraseptic (generic benzocaine) spray or lozenges / Sucrets (generic dyclonine)     

## 2016-12-26 NOTE — Assessment & Plan Note (Signed)
New onset symptoms with in office influenza test negative. Symptoms and exam are consistent with acute upper respiratory infection with concern for developing pneumonia. Start azithromycin. Start Hycodan as needed for cough and sleep. Follow up if symptoms worsen or do not improve.

## 2016-12-26 NOTE — Progress Notes (Signed)
Subjective:    Patient ID: Curtis Townsend, male    DOB: 10-Dec-1975, 41 y.o.   MRN: [id number]  Chief Complaint  Patient presents with  . Sore Throat    productive cough, sore throat, fatigue, chills, not sure of fever, x2 days    HPI:  Curtis Townsend is a 41 y.o. male who  has a past medical history of Alcoholism ([place]); Anxiety; Bipolar 1 disorder ([place]); Depression; Depression; Diabetes ([place]); History of chicken pox; History of kidney stones; Hyperlipemia; IBS (irritable bowel syndrome); Lipoma of back (05/2012); Seasonal allergies; and Wears partial dentures. and presents today for an acute office visit.  This is a new problem. Associated symptoms of sore through, productive cough, fatigue and chills has ben going on for about 2 days. Modifying factors include fluids which has not helped. Notes a subjective fever. Symptoms have stayed about the same since initial onset. His son was recently diagnosed with strep throat.   No Known Allergies    Outpatient Medications Prior to Visit  Medication Sig Dispense Refill  . B Complex Vitamins (VITAMIN B-COMPLEX 100) INJ Inject 1 each as directed every 28 (twenty-eight) days.    . benztropine (COGENTIN) 1 MG tablet Take 1 tablet (1 mg total) by mouth daily. 30 tablet 2  . desonide (DESOWEN) 0.05 % ointment APPLY 1 APPLICATION TO AFFECTED AREA ONCE DAILY AT BEDTIME EXTERNALLY 5 DAYS  1  . divalproex (DEPAKOTE ER) 500 MG 24 hr tablet Take 2 tablets (1,000 mg total) by mouth daily. 69 tablet 2  . FLUoxetine (PROZAC) 40 MG capsule Take 1 capsule (40 mg total) by mouth daily. 30 capsule 2  . HYDROcodone-acetaminophen (NORCO/VICODIN) 5-325 MG tablet Take 1 tablet by mouth every 6 (six) hours as needed. 10 tablet 0  . risperiDONE (RISPERDAL) 1 MG tablet TAKE 1 TABLET BY MOUTH AT BEDTIME 30 tablet 0   No facility-administered medications prior to visit.       Review of Systems  Constitutional: Positive for chills and fatigue.  HENT:  Positive for congestion and sore throat. Negative for ear pain, sinus pain and sinus pressure.   Respiratory: Positive for cough.   Gastrointestinal: Negative for abdominal pain.  Neurological: Negative for headaches.      Objective:    BP 112/80 (BP Location: Left Arm, Patient Position: Sitting, Cuff Size: Large)   Pulse 97   Temp 99 F (37.2 C) (Oral)   Resp 16   Ht 6\' 1"  (1.854 m)   Wt 202 lb (91.6 kg)   SpO2 98%   BMI 26.65 kg/m  Nursing note and vital signs reviewed.  Physical Exam  Constitutional: He is oriented to person, place, and time. He appears well-developed and well-nourished. No distress.  HENT:  Right Ear: Hearing, tympanic membrane, external ear and ear canal normal.  Left Ear: Hearing, tympanic membrane, external ear and ear canal normal.  Nose: Nose normal. Right sinus exhibits no maxillary sinus tenderness and no frontal sinus tenderness. Left sinus exhibits no maxillary sinus tenderness and no frontal sinus tenderness.  Mouth/Throat: Uvula is midline, oropharynx is clear and moist and mucous membranes are normal.  Cardiovascular: Normal rate, regular rhythm, normal heart sounds and intact distal pulses.   Pulmonary/Chest: Effort normal and breath sounds normal.  Neurological: He is alert and oriented to person, place, and time.  Skin: Skin is warm and dry.  Psychiatric: He has a normal mood and affect. His behavior is normal. Judgment and thought content normal.  Assessment & Plan:   Problem List Items Addressed This Visit      Other   Cough - Primary    New onset symptoms with in office influenza test negative. Symptoms and exam are consistent with acute upper respiratory infection with concern for developing pneumonia. Start azithromycin. Start Hycodan as needed for cough and sleep. Follow up if symptoms worsen or do not improve.           I am having Mr. Tardif start on azithromycin and HYDROcodone-homatropine. I am also having him  maintain his desonide, VITAMIN B-COMPLEX 100, HYDROcodone-acetaminophen, FLUoxetine, divalproex, benztropine, and risperiDONE.   Meds ordered this encounter  Medications  . azithromycin (ZITHROMAX) 250 MG tablet    Sig: Take 2 tablets by mouth for 1 day and then 1 tablet by mouth daily for 4 days.    Dispense:  6 tablet    Refill:  0    Order Specific Question:   Supervising Provider    Answer:   Pricilla Holm A [[id number]]  . HYDROcodone-homatropine (HYCODAN) 5-1.5 MG/5ML syrup    Sig: Take 5 mLs by mouth every 8 (eight) hours as needed for cough.    Dispense:  180 mL    Refill:  0    Order Specific Question:   Supervising Provider    Answer:   Pricilla Holm A [[id number]]     Follow-up: Return if symptoms worsen or fail to improve.  Mauricio Po, FNP

## 2017-01-15 ENCOUNTER — Ambulatory Visit (INDEPENDENT_AMBULATORY_CARE_PROVIDER_SITE_OTHER): Payer: Federal, State, Local not specified - PPO | Admitting: Psychiatry

## 2017-01-15 DIAGNOSIS — F314 Bipolar disorder, current episode depressed, severe, without psychotic features: Secondary | ICD-10-CM | POA: Diagnosis not present

## 2017-01-18 NOTE — Progress Notes (Signed)
   THERAPIST PROGRESS NOTE   Session Time: 1:05-2:00  Participation Level:Active  Behavioral Response:CasualAlertStressed  Type of Therapy: Individual Therapy  Treatment Goals addressed: Coping; Emotion Regulation  Interventions:strength-based, person centered therapy, psychoeducation  Summary: Curtis Townsend is a 41 y.o. male who presents with Bipolar Disorder.   Suicidal/Homicidal:Nowithout intent/plan  Therapist Response: Pt. Presents as aggravated, stressed, defensive. Pt. Discussed recurring problem with anger towards his mother. Pt. Was angered by his mother's desire to see his granddaughter's baby, an interest Pt. Did not notice when his children or other grandchildren were born. Pt. Expressed anger toward a pattern of behavior that he perceives as selfish. Pt. Also expressed anger towards his mother's lack of self-care, the fact that she lives with him becomes possessive over household items. Several months ago Pt. Had decided to set boundaries with his mother and explore finding her an apartment, then later determined that it was his duty to take care of her and allow her to live with him despite his mother's acknowledgement. Counselor brought to patient's attention the need to reassert boundaries with his mother, an issue that Pt. Is generally reluctant to do despite the stress that the absence of boundaries causes for him.  Plan: Continue with CBT.Return again in 2weeks.  Diagnosis:Axis I:Bipolar, mixed  Axis II:No diagnosis  Nancie Neas, [facility] 01/18/2017

## 2017-01-29 ENCOUNTER — Ambulatory Visit (INDEPENDENT_AMBULATORY_CARE_PROVIDER_SITE_OTHER): Payer: Federal, State, Local not specified - PPO | Admitting: Psychiatry

## 2017-01-29 DIAGNOSIS — F314 Bipolar disorder, current episode depressed, severe, without psychotic features: Secondary | ICD-10-CM | POA: Diagnosis not present
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
## 2017-02-05 DIAGNOSIS — H40013 Open angle with borderline findings, low risk, bilateral: Secondary | ICD-10-CM | POA: Diagnosis not present

## 2017-02-12 ENCOUNTER — Ambulatory Visit (HOSPITAL_COMMUNITY): Payer: Federal, State, Local not specified - PPO | Admitting: Psychiatry

## 2017-02-13 ENCOUNTER — Ambulatory Visit (HOSPITAL_COMMUNITY): Payer: Self-pay | Admitting: Psychiatry

## 2017-02-19 ENCOUNTER — Ambulatory Visit (INDEPENDENT_AMBULATORY_CARE_PROVIDER_SITE_OTHER): Payer: Federal, State, Local not specified - PPO | Admitting: Psychiatry

## 2017-02-19 ENCOUNTER — Encounter (HOSPITAL_COMMUNITY): Payer: Self-pay | Admitting: Psychiatry

## 2017-02-19 VITALS — BP 120/70 | HR 100 | Ht 73.0 in | Wt 209.0 lb

## 2017-02-19 DIAGNOSIS — Z62819 Personal history of unspecified abuse in childhood: Secondary | ICD-10-CM

## 2017-02-19 DIAGNOSIS — F3163 Bipolar disorder, current episode mixed, severe, without psychotic features: Secondary | ICD-10-CM | POA: Diagnosis not present

## 2017-02-19 DIAGNOSIS — F4310 Post-traumatic stress disorder, unspecified: Secondary | ICD-10-CM

## 2017-02-19 DIAGNOSIS — F17210 Nicotine dependence, cigarettes, uncomplicated: Secondary | ICD-10-CM | POA: Diagnosis not present

## 2017-02-19 DIAGNOSIS — Z79899 Other long term (current) drug therapy: Secondary | ICD-10-CM

## 2017-02-19 MED ORDER — FLUOXETINE HCL 40 MG PO CAPS: 40.0000 mg | ORAL_CAPSULE | Freq: Every day | ORAL | 0 refills | Status: DC

## 2017-02-19 MED ORDER — BENZTROPINE MESYLATE 1 MG PO TABS: 1.0000 mg | ORAL_TABLET | Freq: Every day | ORAL | 0 refills | Status: DC

## 2017-02-19 MED ORDER — RISPERIDONE 1 MG PO TABS: 1.0000 mg | ORAL_TABLET | Freq: Every day | ORAL | 0 refills | Status: DC

## 2017-02-19 MED ORDER — DIVALPROEX SODIUM ER 500 MG PO TB24: 1000.0000 mg | ORAL_TABLET | Freq: Every day | ORAL | 0 refills | Status: DC
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
## 2017-02-22 ENCOUNTER — Encounter: Payer: Self-pay | Admitting: Internal Medicine

## 2017-03-01 ENCOUNTER — Encounter: Payer: Self-pay | Admitting: Internal Medicine

## 2017-03-05 ENCOUNTER — Ambulatory Visit (HOSPITAL_COMMUNITY): Payer: Federal, State, Local not specified - PPO | Admitting: Psychiatry

## 2017-03-06 ENCOUNTER — Encounter: Payer: Self-pay | Admitting: Internal Medicine

## 2017-03-09 DIAGNOSIS — J343 Hypertrophy of nasal turbinates: Secondary | ICD-10-CM | POA: Insufficient documentation

## 2017-03-14 ENCOUNTER — Ambulatory Visit (INDEPENDENT_AMBULATORY_CARE_PROVIDER_SITE_OTHER): Payer: Federal, State, Local not specified - PPO | Admitting: Internal Medicine

## 2017-03-14 ENCOUNTER — Encounter: Payer: Self-pay | Admitting: Internal Medicine

## 2017-03-14 VITALS — BP 120/80 | HR 84 | Temp 99.1°F | Resp 16 | Ht 73.0 in | Wt 210.0 lb

## 2017-03-14 DIAGNOSIS — R0609 Other forms of dyspnea: Secondary | ICD-10-CM

## 2017-03-14 DIAGNOSIS — G63 Polyneuropathy in diseases classified elsewhere: Secondary | ICD-10-CM

## 2017-03-14 DIAGNOSIS — Z800 Family history of malignant neoplasm of digestive organs: Secondary | ICD-10-CM | POA: Diagnosis not present

## 2017-03-14 DIAGNOSIS — E7800 Pure hypercholesterolemia, unspecified: Secondary | ICD-10-CM | POA: Diagnosis not present

## 2017-03-14 DIAGNOSIS — E538 Deficiency of other specified B group vitamins: Secondary | ICD-10-CM | POA: Diagnosis not present

## 2017-03-14 DIAGNOSIS — E781 Pure hyperglyceridemia: Secondary | ICD-10-CM | POA: Insufficient documentation

## 2017-03-14 DIAGNOSIS — Z0000 Encounter for general adult medical examination without abnormal findings: Secondary | ICD-10-CM

## 2017-03-14 NOTE — Patient Instructions (Signed)

## 2017-03-14 NOTE — Progress Notes (Signed)
Subjective:  Patient ID: Curtis Townsend, male    DOB: Aug 25, 1975  Age: 41 y.o. MRN: [id number]  CC: Annual Exam and Hyperlipidemia   HPI Curtis Townsend presents for a CPX. He is concerned about his risk for colon cancer. He tells me that both his uncle and his father have been diagnosed with colon cancer. He also complains that about 3 weeks ago he had an episode of DOE and SOB that was associated with severe anxiety. The symptoms have not returned since then.  Outpatient Medications Prior to Visit  Medication Sig Dispense Refill  . B Complex Vitamins (VITAMIN B-COMPLEX 100) INJ Inject 1 each as directed every 28 (twenty-eight) days.    . benztropine (COGENTIN) 1 MG tablet Take 1 tablet (1 mg total) by mouth daily. 90 tablet 0  . desonide (DESOWEN) 0.05 % ointment APPLY 1 APPLICATION TO AFFECTED AREA ONCE DAILY AT BEDTIME EXTERNALLY 5 DAYS  1  . divalproex (DEPAKOTE ER) 500 MG 24 hr tablet Take 2 tablets (1,000 mg total) by mouth daily. 180 tablet 0  . FLUoxetine (PROZAC) 40 MG capsule Take 1 capsule (40 mg total) by mouth daily. 90 capsule 0  . risperiDONE (RISPERDAL) 1 MG tablet Take 1 tablet (1 mg total) by mouth at bedtime. 90 tablet 0  . azithromycin (ZITHROMAX) 250 MG tablet Take 2 tablets by mouth for 1 day and then 1 tablet by mouth daily for 4 days. 6 tablet 0  . HYDROcodone-acetaminophen (NORCO/VICODIN) 5-325 MG tablet Take 1 tablet by mouth every 6 (six) hours as needed. 10 tablet 0  . HYDROcodone-homatropine (HYCODAN) 5-1.5 MG/5ML syrup Take 5 mLs by mouth every 8 (eight) hours as needed for cough. 180 mL 0   No facility-administered medications prior to visit.     ROS Review of Systems  Constitutional: Negative.  Negative for appetite change, chills, diaphoresis, fatigue and unexpected weight change.  HENT: Negative.  Negative for sinus pressure and trouble swallowing.   Eyes: Negative.   Respiratory: Negative.  Negative for cough, chest tightness, shortness of  breath and wheezing.   Cardiovascular: Negative for chest pain, palpitations and leg swelling.  Gastrointestinal: Negative for abdominal pain, constipation, diarrhea, nausea and vomiting.  Endocrine: Negative.   Genitourinary: Negative.  Negative for difficulty urinating, dysuria, penile swelling, scrotal swelling, testicular pain and urgency.  Musculoskeletal: Negative.  Negative for back pain and myalgias.  Skin: Negative.  Negative for color change and rash.  Allergic/Immunologic: Negative.   Neurological: Negative.  Negative for dizziness, weakness and headaches.  Hematological: Negative for adenopathy. Does not bruise/bleed easily.  Psychiatric/Behavioral: Negative.     Objective:  BP 120/80 (BP Location: Left Arm, Patient Position: Sitting, Cuff Size: Normal)   Pulse 84   Temp 99.1 F (37.3 C) (Oral)   Resp 16   Ht 6\' 1"  (1.854 m)   Wt 210 lb (95.3 kg)   SpO2 100%   BMI 27.71 kg/m   BP Readings from Last 3 Encounters:  03/14/17 120/80  12/26/16 112/80  10/09/16 129/80    Wt Readings from Last 3 Encounters:  03/14/17 210 lb (95.3 kg)  12/26/16 202 lb (91.6 kg)  07/16/16 207 lb (93.9 kg)    Physical Exam  Constitutional: He is oriented to person, place, and time. No distress.  HENT:  Mouth/Throat: Oropharynx is clear and moist. No oropharyngeal exudate.  Eyes: Conjunctivae are normal. Right eye exhibits no discharge. Left eye exhibits no discharge. No scleral icterus.  Neck: Normal range of motion.  Neck supple. No JVD present. No thyromegaly present.  Cardiovascular: Normal rate, regular rhythm and intact distal pulses.  Exam reveals no gallop and no friction rub.   No murmur heard. EKG - Sinus  Rhythm  WITHIN NORMAL LIMITS- no change from prior EKG   Pulmonary/Chest: Effort normal and breath sounds normal. No respiratory distress. He has no wheezes. He has no rales. He exhibits no tenderness.  Abdominal: Soft. Bowel sounds are normal. He exhibits no distension  and no mass. There is no tenderness. There is no rebound and no guarding. Hernia confirmed negative in the right inguinal area and confirmed negative in the left inguinal area.  Genitourinary: Testes normal and penis normal. Right testis shows no mass, no swelling and no tenderness. Right testis is descended. Left testis shows no mass, no swelling and no tenderness. Left testis is descended. Circumcised. No penile erythema or penile tenderness. No discharge found.  Musculoskeletal: Normal range of motion. He exhibits no edema, tenderness or deformity.  Lymphadenopathy:    He has no cervical adenopathy.       Right: No inguinal adenopathy present.       Left: No inguinal adenopathy present.  Neurological: He is alert and oriented to person, place, and time.  Skin: Skin is warm and dry. No rash noted. He is not diaphoretic. No erythema. No pallor.  Vitals reviewed.   Lab Results  Component Value Date   WBC 7.6 07/16/2016   HGB 16.3 07/16/2016   HCT 46.3 07/16/2016   PLT 194 07/16/2016   GLUCOSE 84 07/16/2016   CHOL 197 02/22/2016   TRIG 160.0 (H) 02/22/2016   HDL 45.00 02/22/2016   LDLCALC 120 (H) 02/22/2016   ALT 32 07/16/2016   AST 25 07/16/2016   NA 134 (L) 07/16/2016   K 4.2 07/16/2016   CL 101 07/16/2016   CREATININE 1.03 07/16/2016   BUN 13 07/16/2016   CO2 24 07/16/2016   TSH 0.79 06/07/2016   HGBA1C 5.8 02/22/2016    Dg Chest 2 View  Result Date: 10/09/2016 CLINICAL DATA:  Altercation pain in the left anterior upper chest EXAM: CHEST  2 VIEW COMPARISON:  06/07/2016 FINDINGS: The heart size and mediastinal contours are within normal limits. Both lungs are clear. The visualized skeletal structures are unremarkable. IMPRESSION: No active cardiopulmonary disease. Electronically Signed   By: Donavan Foil M.D.   On: 10/09/2016 22:36    Assessment & Plan:   Curtis Townsend was seen today for annual exam and hyperlipidemia.  Diagnoses and all orders for this visit:  Vitamin B12  deficiency neuropathy ([place])- will monitor his H/H and will cont B12 replacement tx -     CBC with Differential/Platelet; Future  Routine general medical examination at a health care facility- exam completed, labs ordered, he refused a flu vaccine, patient education material was given. -     PSA; Future  Pure hypercholesterolemia- will recheck his fasting lipid panel and will treat if indicated. -     Lipid panel; Future  Pure hyperglyceridemia- will recheck his triglyceride level and will treat if indicated. -     Lipid panel; Future  DOE (dyspnea on exertion)- he had a brief episode of DOE with anxiety, his EKG is normal and the symptoms have returned. Will check a d-dimer to screen for pulmonary embolus. His EKG is negative for LVH or ischemia. Will also check his labs to see if there is concern for secondary causes of shortness of breath such as anemia or electrolyte abnormality. -  Comprehensive metabolic panel; Future -     CBC with Differential/Platelet; Future -     D-dimer, quantitative (not at [facility]); Future -     EKG 12-Lead  Family history of colon cancer in father -     Ambulatory referral to Gastroenterology   I have discontinued Mr. Zenor's HYDROcodone-acetaminophen, azithromycin, and HYDROcodone-homatropine. I am also having him maintain his desonide, VITAMIN B-COMPLEX 100, risperiDONE, FLUoxetine, divalproex, and benztropine.  No orders of the defined types were placed in this encounter.    Follow-up: Return in about 6 months (around 09/11/2017).  Scarlette Calico, MD

## 2017-03-19 ENCOUNTER — Ambulatory Visit (INDEPENDENT_AMBULATORY_CARE_PROVIDER_SITE_OTHER): Payer: Federal, State, Local not specified - PPO | Admitting: Psychiatry

## 2017-03-19 DIAGNOSIS — F3163 Bipolar disorder, current episode mixed, severe, without psychotic features: Secondary | ICD-10-CM | POA: Diagnosis not present
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
## 2017-03-27 DIAGNOSIS — K080 Exfoliation of teeth due to systemic causes: Secondary | ICD-10-CM | POA: Diagnosis not present

## 2017-04-02 ENCOUNTER — Ambulatory Visit (HOSPITAL_COMMUNITY): Payer: Self-pay | Admitting: Psychiatry

## 2017-04-03 ENCOUNTER — Encounter: Payer: Self-pay | Admitting: Internal Medicine

## 2017-04-16 ENCOUNTER — Ambulatory Visit (HOSPITAL_COMMUNITY): Payer: Federal, State, Local not specified - PPO | Admitting: Psychiatry

## 2017-04-30 ENCOUNTER — Ambulatory Visit (HOSPITAL_COMMUNITY): Payer: Self-pay | Admitting: Psychiatry

## 2017-05-07 ENCOUNTER — Ambulatory Visit (HOSPITAL_COMMUNITY): Payer: Self-pay | Admitting: Licensed Clinical Social Worker

## 2017-05-14 ENCOUNTER — Ambulatory Visit (HOSPITAL_COMMUNITY): Payer: Self-pay | Admitting: Licensed Clinical Social Worker

## 2017-05-21 ENCOUNTER — Ambulatory Visit (HOSPITAL_COMMUNITY): Payer: Self-pay | Admitting: Psychiatry

## 2017-05-28 ENCOUNTER — Encounter (HOSPITAL_COMMUNITY): Payer: Self-pay | Admitting: Licensed Clinical Social Worker

## 2017-05-28 ENCOUNTER — Ambulatory Visit (INDEPENDENT_AMBULATORY_CARE_PROVIDER_SITE_OTHER): Payer: Federal, State, Local not specified - PPO | Admitting: Licensed Clinical Social Worker

## 2017-05-28 DIAGNOSIS — F4310 Post-traumatic stress disorder, unspecified: Secondary | ICD-10-CM | POA: Diagnosis not present

## 2017-05-28 DIAGNOSIS — F3160 Bipolar disorder, current episode mixed, unspecified: Secondary | ICD-10-CM
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
## 2017-05-30 ENCOUNTER — Ambulatory Visit (HOSPITAL_COMMUNITY): Payer: Self-pay | Admitting: Psychiatry

## 2017-06-04 ENCOUNTER — Ambulatory Visit (INDEPENDENT_AMBULATORY_CARE_PROVIDER_SITE_OTHER): Payer: Federal, State, Local not specified - PPO | Admitting: Psychiatry

## 2017-06-04 ENCOUNTER — Other Ambulatory Visit (HOSPITAL_COMMUNITY): Payer: Self-pay

## 2017-06-04 ENCOUNTER — Encounter (HOSPITAL_COMMUNITY): Payer: Self-pay | Admitting: Psychiatry

## 2017-06-04 DIAGNOSIS — F319 Bipolar disorder, unspecified: Secondary | ICD-10-CM

## 2017-06-04 DIAGNOSIS — Z62810 Personal history of physical and sexual abuse in childhood: Secondary | ICD-10-CM

## 2017-06-04 DIAGNOSIS — Z634 Disappearance and death of family member: Secondary | ICD-10-CM | POA: Diagnosis not present

## 2017-06-04 DIAGNOSIS — F3163 Bipolar disorder, current episode mixed, severe, without psychotic features: Secondary | ICD-10-CM | POA: Diagnosis not present

## 2017-06-04 DIAGNOSIS — F17210 Nicotine dependence, cigarettes, uncomplicated: Secondary | ICD-10-CM | POA: Diagnosis not present

## 2017-06-04 DIAGNOSIS — Z62811 Personal history of psychological abuse in childhood: Secondary | ICD-10-CM | POA: Diagnosis not present

## 2017-06-04 DIAGNOSIS — Z79899 Other long term (current) drug therapy: Secondary | ICD-10-CM

## 2017-06-04 DIAGNOSIS — Z915 Personal history of self-harm: Secondary | ICD-10-CM | POA: Diagnosis not present

## 2017-06-04 MED ORDER — BENZTROPINE MESYLATE 1 MG PO TABS: 1.0000 mg | ORAL_TABLET | Freq: Every day | ORAL | 0 refills | Status: DC

## 2017-06-04 MED ORDER — DIVALPROEX SODIUM ER 500 MG PO TB24: 1000.0000 mg | ORAL_TABLET | Freq: Every day | ORAL | 0 refills | Status: DC

## 2017-06-04 MED ORDER — FLUOXETINE HCL 40 MG PO CAPS: 40.0000 mg | ORAL_CAPSULE | Freq: Every day | ORAL | 0 refills | Status: DC

## 2017-06-04 MED ORDER — RISPERIDONE 1 MG PO TABS: 1.0000 mg | ORAL_TABLET | Freq: Every day | ORAL | 0 refills | Status: DC
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
## 2017-06-05 LAB — COMPREHENSIVE METABOLIC PANEL
A/G RATIO: 1.7 (ref 1.2–2.2)
ALT: 47 IU/L — AB (ref 0–44)
AST: 25 IU/L (ref 0–40)
Albumin: 4.6 g/dL (ref 3.5–5.5)
Alkaline Phosphatase: 102 IU/L (ref 39–117)
BILIRUBIN TOTAL: 0.6 mg/dL (ref 0.0–1.2)
BUN/Creatinine Ratio: 8 — ABNORMAL LOW (ref 9–20)
BUN: 10 mg/dL (ref 6–24)
CALCIUM: 9.9 mg/dL (ref 8.7–10.2)
CHLORIDE: 101 mmol/L (ref 96–106)
CO2: 25 mmol/L (ref 20–29)
Creatinine, Ser: 1.32 mg/dL — ABNORMAL HIGH (ref 0.76–1.27)
GFR calc Af Amer: 77 mL/min/{1.73_m2} (ref >59)
GFR, EST NON AFRICAN AMERICAN: 67 mL/min/{1.73_m2} (ref >59)
GLOBULIN, TOTAL: 2.7 g/dL (ref 1.5–4.5)
Glucose: 93 mg/dL (ref 65–99)
POTASSIUM: 4.8 mmol/L (ref 3.5–5.2)
SODIUM: 141 mmol/L (ref 134–144)
TOTAL PROTEIN: 7.3 g/dL (ref 6.0–8.5)

## 2017-06-05 LAB — HEMOGLOBIN A1C
Est. average glucose Bld gHb Est-mCnc: 123 mg/dL
Hgb A1c MFr Bld: 5.9 % — ABNORMAL HIGH (ref 4.8–5.6)

## 2017-06-05 LAB — CBC WITH DIFFERENTIAL/PLATELET
BASOS ABS: 0 10*3/uL (ref 0.0–0.2)
BASOS: 0 %
EOS (ABSOLUTE): 0.1 10*3/uL (ref 0.0–0.4)
Eos: 1 %
Hematocrit: 51.9 % — ABNORMAL HIGH (ref 37.5–51.0)
Hemoglobin: 18 g/dL — ABNORMAL HIGH (ref 13.0–17.7)
IMMATURE GRANULOCYTES: 0 %
Immature Grans (Abs): 0 10*3/uL (ref 0.0–0.1)
Lymphocytes Absolute: 1.9 10*3/uL (ref 0.7–3.1)
Lymphs: 36 %
MCH: 31.7 pg (ref 26.6–33.0)
MCHC: 34.7 g/dL (ref 31.5–35.7)
MCV: 92 fL (ref 79–97)
MONOS ABS: 0.4 10*3/uL (ref 0.1–0.9)
Monocytes: 7 %
NEUTROS ABS: 3 10*3/uL (ref 1.4–7.0)
Neutrophils: 56 %
PLATELETS: 173 10*3/uL (ref 150–379)
RBC: 5.67 x10E6/uL (ref 4.14–5.80)
RDW: 13.7 % (ref 12.3–15.4)
WBC: 5.4 10*3/uL (ref 3.4–10.8)

## 2017-06-05 LAB — VALPROIC ACID LEVEL: Valproic Acid Lvl: <4 ug/mL — ABNORMAL LOW (ref 50–100)

## 2017-06-11 ENCOUNTER — Ambulatory Visit (HOSPITAL_COMMUNITY): Payer: Self-pay | Admitting: Licensed Clinical Social Worker

## 2017-06-25 ENCOUNTER — Encounter (HOSPITAL_COMMUNITY): Payer: Self-pay | Admitting: Licensed Clinical Social Worker

## 2017-06-25 ENCOUNTER — Ambulatory Visit (INDEPENDENT_AMBULATORY_CARE_PROVIDER_SITE_OTHER): Payer: Federal, State, Local not specified - PPO | Admitting: Licensed Clinical Social Worker

## 2017-06-25 DIAGNOSIS — F4310 Post-traumatic stress disorder, unspecified: Secondary | ICD-10-CM | POA: Diagnosis not present

## 2017-06-25 DIAGNOSIS — F3163 Bipolar disorder, current episode mixed, severe, without psychotic features: Secondary | ICD-10-CM
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
## 2017-07-09 ENCOUNTER — Ambulatory Visit (HOSPITAL_COMMUNITY): Payer: Federal, State, Local not specified - PPO | Admitting: Licensed Clinical Social Worker

## 2017-07-09 NOTE — Progress Notes (Unsigned)
   THERAPIST PROGRESS NOTE  Session Time: ***  Participation Level: {BHH PARTICIPATION LEVEL:22264}  Behavioral Response: {Appearance:22683}{BHH LEVEL OF CONSCIOUSNESS:22305}{BHH MOOD:22306}  Type of Therapy: Individual Therapy  Treatment Goals addressed: Improve psychiatric symptoms, elevate mood (decreased irritability, increased enjoyment of activities), Improve unhelpful thought patterns, emotional regulation skills (reduce temper outburst), Interpersonal relationship skills  Interventions: Motivational Interviewing and Other: Grounding and Mindfulness techniques  Summary: Curtis Townsend is a 41 y.o. male who presents with PTSD and Bipolar I Disorder, mixed, severe  Suicidal/Homicidal: No without intent/plan  Therapist Response:  Curtis Townsend met with clinician for individual therapy. Curtis Townsend discussed his psychiatric symptoms and current life events. Curtis Townsend shared   Plan: Return again in 1-2 weeks.  Diagnosis:     Axis I: PTSD and Bipolar I Disorder, mixed, severe   [name] 07/09/2017

## 2017-07-23 ENCOUNTER — Encounter (HOSPITAL_COMMUNITY): Payer: Self-pay | Admitting: Licensed Clinical Social Worker

## 2017-07-23 ENCOUNTER — Ambulatory Visit (INDEPENDENT_AMBULATORY_CARE_PROVIDER_SITE_OTHER): Payer: Federal, State, Local not specified - PPO | Admitting: Licensed Clinical Social Worker

## 2017-07-23 DIAGNOSIS — F4310 Post-traumatic stress disorder, unspecified: Secondary | ICD-10-CM | POA: Diagnosis not present
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
## 2017-07-27 ENCOUNTER — Ambulatory Visit: Payer: Self-pay | Admitting: *Deleted

## 2017-07-27 ENCOUNTER — Ambulatory Visit (INDEPENDENT_AMBULATORY_CARE_PROVIDER_SITE_OTHER): Payer: Federal, State, Local not specified - PPO | Admitting: Family

## 2017-07-27 ENCOUNTER — Encounter: Payer: Self-pay | Admitting: Family

## 2017-07-27 VITALS — BP 118/80 | HR 73 | Temp 98.5°F | Ht 73.0 in | Wt 209.0 lb

## 2017-07-27 DIAGNOSIS — N5089 Other specified disorders of the male genital organs: Secondary | ICD-10-CM | POA: Diagnosis not present

## 2017-07-27 DIAGNOSIS — N451 Epididymitis: Secondary | ICD-10-CM | POA: Diagnosis not present

## 2017-07-27 MED ORDER — DOXYCYCLINE HYCLATE 100 MG PO TABS: 100.0000 mg | ORAL_TABLET | Freq: Two times a day (BID) | ORAL | 0 refills | Status: DC

## 2017-07-27 NOTE — Telephone Encounter (Signed)
Pt c/o swollen, painful left testicle. Pt stated he had left some heavy furniture this week.  Appointment made with Mindi Slicker NP for this afternoon.  Reason for Disposition . [1] Pain comes and goes (intermittent) AND [2] present > 24 hours  Answer Assessment - Initial Assessment Questions 1. SCROTAL SWELLING: "What does the scrotum look like?" "How swollen is it?" (mild, moderate severe; compare to other side)     The size of a nicle 2. LOCATION: "Where is the swelling located?"     Let groin 3. ONSET: "When did the swelling start?"     Tuesday night 4. PATTERN: "Does it come and go, or has it been constant since it started?"     Constant size 5. SCROTAL PAIN: "Is there any pain?" If so, ask: "How bad is it?"  (Scale 1-10; or mild, moderate, severe)     Yes pain, 3 6. HERNIA: "Has a doctor ever told you that you have a hernia?"     no 7. OTHER SYMPTOMS: "Do you have any other symptoms?" (e.g., fever, abdominal pain, vomiting, difficulty passing urine)     Some pain on the left side, feels nauseous at times  Protocols used: SCROTAL PAIN-A-AH, SCROTUM [facility]

## 2017-07-27 NOTE — Progress Notes (Signed)
Curtis Townsend is a 42 y.o. male with the following history as recorded in EpicCare:  Patient Active Problem List   Diagnosis Date Noted  . Pure hypercholesterolemia 03/14/2017  . Pure hyperglyceridemia 03/14/2017  . Family history of colon cancer in father 03/14/2017  . B12 deficiency 06/28/2016  . Vitamin B12 deficiency neuropathy ([place]) 06/08/2016  . Routine general medical examination at a health care facility 01/27/2015  . Chronic shoulder pain 04/22/2014  . DOE (dyspnea on exertion) 02/12/2014  . Bipolar I disorder ([place]) 12/29/2013  . Neurosis, posttraumatic 12/29/2013  . Bipolar affective disorder, depressed ([place]) 08/04/2013    Current Outpatient Medications  Medication Sig Dispense Refill  . B Complex Vitamins (VITAMIN B-COMPLEX 100) INJ Inject 1 each as directed every 28 (twenty-eight) days.    . benztropine (COGENTIN) 1 MG tablet Take 1 tablet (1 mg total) by mouth daily. 90 tablet 0  . desonide (DESOWEN) 0.05 % ointment APPLY 1 APPLICATION TO AFFECTED AREA ONCE DAILY AT BEDTIME EXTERNALLY 5 DAYS  1  . divalproex (DEPAKOTE ER) 500 MG 24 hr tablet Take 2 tablets (1,000 mg total) by mouth daily. 180 tablet 0  . FLUoxetine (PROZAC) 40 MG capsule Take 1 capsule (40 mg total) by mouth daily. 90 capsule 0  . risperiDONE (RISPERDAL) 1 MG tablet Take 1 tablet (1 mg total) by mouth at bedtime. 90 tablet 0  . doxycycline (VIBRA-TABS) 100 MG tablet Take 1 tablet (100 mg total) by mouth 2 (two) times daily. 20 tablet 0   No current facility-administered medications for this visit.     Allergies: Patient has no known allergies.  Past Medical History:  Diagnosis Date  . Alcoholism ([place])   . Anxiety   . Bipolar 1 disorder ([place])   . Depression   . Depression   . Diabetes ([place])   . History of chicken pox   . History of kidney stones   . Hyperlipemia    no current med.  . IBS (irritable bowel syndrome)   . Lipoma of back 05/2012   right  . Seasonal allergies   . Wears partial  dentures    upper    Past Surgical History:  Procedure Laterality Date  . LIPOMA EXCISION  06/10/2012   Procedure: EXCISION LIPOMA;  Surgeon: Ralene Ok, MD;  Location: [place];  Service: General;  Laterality: Right;  excision of back lipoma on right 3x3cm    Family History  Problem Relation Age of Onset  . Stroke Mother   . Hypertension Mother   . Diabetes Mother   . Hypertension Father   . Diabetes Father   . Cancer Paternal Uncle        Prostate Cancer    Social History   Tobacco Use  . Smoking status: Current Some Day Smoker    Packs/day: 0.10    Years: 16.00    Pack years: 1.60    Types: Cigarettes  . Smokeless tobacco: Never Used  . Tobacco comment: 1-2 cig./month  Substance Use Topics  . Alcohol use: Yes    Alcohol/week: 0.0 oz    Comment: Occasional use    Subjective:  Left groin pain x 1 week; noted "lump" on left testicle on Tuesday; no urinary symptoms; symptoms started after heavy lifting with garbage can; wife accompanies to visit today; both deny any concerns for STD exposure; no fever;   Objective:  Vitals:   07/27/17 1540  BP: 118/80  Pulse: 73  Temp: 98.5 F (36.9 C)  TempSrc: Oral  SpO2: 99%  Weight: 209 lb (94.8 kg)  Height: 6\' 1"  (1.854 m)    General: Well developed, well nourished, in no acute distress  Skin : Warm and dry.  Head: Normocephalic and atraumatic  Lungs: Respirations unlabored; clear to auscultation bilaterally without wheeze, rales, rhonchi  Abdomen: Soft; nontender; nondistended; normoactive bowel sounds; no masses or hepatosplenomegaly  Musculoskeletal: No deformities; no active joint inflammation  Extremities: No edema, cyanosis, clubbing  Vessels: Symmetric bilaterally  Neurologic: Alert and oriented; speech intact; face symmetrical; moves all extremities well; CNII-XII intact without focal deficit  Left testicle tender to palpation; tenderness noted in left groin region; no pain relief with  elevating the testicle;  Assessment:  1. Testicular swelling, left   2. Acute epididymitis     Plan:  Low risk for STD-will hold testing today; suspect related to moving garbage can up the hill; Rx for Doxycycline 100 mg bid x 10 days; rest, ice to affected area; literature provided; order placed for ultrasound; follow-up worse, no better.   No Follow-up on file.  Orders Placed This Encounter  Procedures  . US Scrotum    Standing Status:   Future    Standing Expiration Date:   09/25/2018    Order Specific Question:   Reason for Exam (SYMPTOM  OR DIAGNOSIS REQUIRED)    Answer:   left testicular swelling/ pain    Order Specific Question:   Preferred imaging location?    Answer:   [facility]    Requested Prescriptions   Signed Prescriptions Disp Refills  . doxycycline (VIBRA-TABS) 100 MG tablet 20 tablet 0    Sig: Take 1 tablet (100 mg total) by mouth 2 (two) times daily.

## 2017-07-27 NOTE — Patient Instructions (Signed)
Epididymitis Epididymitis is swelling (inflammation) of the epididymis. The epididymis is a cord-like structure that is located along the top and back part of the testicle. It collects and stores sperm from the testicle. This condition can also cause pain and swelling of the testicle and scrotum. Symptoms usually start suddenly (acute epididymitis). Sometimes epididymitis starts gradually and lasts for a while (chronic epididymitis). This type may be harder to treat. What are the causes? In men 35 and younger, this condition is usually caused by a bacterial infection or sexually transmitted disease (STD), such as:  Gonorrhea.  Chlamydia.  In men 35 and older who do not have anal sex, this condition is usually caused by bacteria from a blockage or abnormalities in the urinary system. These can result from:  Having a tube placed into the bladder (urinary catheter).  Having an enlarged or inflamed prostate gland.  Having recent urinary tract surgery.  In men who have a condition that weakens the body's defense system (immune system), such as HIV, this condition can be caused by:  Other bacteria, including tuberculosis and syphilis.  Viruses.  Fungi.  Sometimes this condition occurs without infection. That may happen if urine flows backward into the epididymis after heavy lifting or straining. What increases the risk? This condition is more likely to develop in men:  Who have unprotected sex with more than one partner.  Who have anal sex.  Who have recently had surgery.  Who have a urinary catheter.  Who have urinary problems.  Who have a suppressed immune system.  What are the signs or symptoms? This condition usually begins suddenly with chills, fever, and pain behind the scrotum and in the testicle. Other symptoms include:  Swelling of the scrotum, testicle, or both.  Pain whenejaculatingor urinating.  Pain in the back or belly.  Nausea.  Itching and discharge  from the penis.  Frequent need to pass urine.  Redness and tenderness of the scrotum.  How is this diagnosed? Your health care provider can diagnose this condition based on your symptoms and medical history. Your health care provider will also do a physical exam to ask about your symptoms and check your scrotum and testicle for swelling, pain, and redness. You may also have other tests, including:  Examination of discharge from the penis.  Urine tests for infections, such as STDs.  Your health care provider may test you for other STDs, including HIV. How is this treated? Treatment for this condition depends on the cause. If your condition is caused by a bacterial infection, oral antibiotic medicine may be prescribed. If the bacterial infection has spread to your blood, you may need to receive IV antibiotics. Nonbacterial epididymitis is treated with home care that includes bed rest and elevation of the scrotum. Surgery may be needed to treat:  Bacterial epididymitis that causes pus to build up in the scrotum (abscess).  Chronic epididymitis that has not responded to other treatments.  Follow these instructions at home: Medicines  Take over-the-counter and prescription medicines only as told by your health care provider.  If you were prescribed an antibiotic medicine, take it as told by your health care provider. Do not stop taking the antibiotic even if your condition improves. Sexual Activity  If your epididymitis was caused by an STD, avoid sexual activity until your treatment is complete.  Inform your sexual partner or partners if you test positive for an STD. They may need to be treated.Do not engage in sexual activity with your partner or   partners until their treatment is completed. General instructions  Return to your normal activities as told by your health care provider. Ask your health care provider what activities are safe for you.  Keep your scrotum elevated and  supported while resting. Ask your health care provider if you should wear a scrotal support, such as a jockstrap. Wear it as told by your health care provider.  If directed, apply ice to the affected area: ? Put ice in a plastic bag. ? Place a towel between your skin and the bag. ? Leave the ice on for 20 minutes, 2-3 times per day.  Try taking a sitz bath to help with discomfort. This is a warm water bath that is taken while you are sitting down. The water should only come up to your hips and should cover your buttocks. Do this 3-4 times per day or as told by your health care provider.  Keep all follow-up visits as told by your health care provider. This is important. Contact a health care provider if:  You have a fever.  Your pain medicine is not helping.  Your pain is getting worse.  Your symptoms do not improve within three days. This information is not intended to replace advice given to you by your health care provider. Make sure you discuss any questions you have with your health care provider. Document Released: 06/23/2000 Document Revised: 12/02/2015 Document Reviewed: 11/11/2014 Elsevier Interactive Patient Education  2018 [employer]  

## 2017-07-28 ENCOUNTER — Telehealth: Payer: Self-pay | Admitting: Surgical

## 2017-07-28 ENCOUNTER — Telehealth: Payer: Self-pay | Admitting: Family Medicine

## 2017-07-28 MED ORDER — SULFAMETHOXAZOLE-TRIMETHOPRIM 800-160 MG PO TABS: 1.0000 | ORAL_TABLET | Freq: Two times a day (BID) | ORAL | 0 refills | Status: DC

## 2017-07-28 NOTE — Telephone Encounter (Signed)
Spoke with Butch Penny for nurse triage. She stated that patient was seen yesterday by Dr. Ronnald Ramp. Patient was given Doxycycline and is causing vomiting. Requesting new prescription to be sent in. After speaking with  Dr. Anitra Lauth he will send in Bactrim to the patients pharmacy. Verbal given to the nurse that new medication would be sent to the pharmacy.

## 2017-07-28 NOTE — Telephone Encounter (Signed)
Pt called stating he was recently rx'd with epididymitis and was rx'd doxy. He states he started vomiting after taking the doxy  --I reviewed Dr. Ronnald Ramp' note.  Low suspicion of STD. I sent in bactrim ds, 1 bid x 10d for pt to take. Stop doxy.

## 2017-07-28 NOTE — Telephone Encounter (Signed)
Agree.  See telephone note.

## 2017-08-06 ENCOUNTER — Ambulatory Visit (HOSPITAL_COMMUNITY): Payer: Self-pay | Admitting: Licensed Clinical Social Worker

## 2017-08-09 NOTE — Addendum Note (Signed)
Addended by: Karle Barr on: 08/09/2017 08:48 AM   Modules accepted: Orders

## 2017-08-20 ENCOUNTER — Ambulatory Visit (HOSPITAL_COMMUNITY): Payer: Self-pay | Admitting: Licensed Clinical Social Worker

## 2017-08-24 ENCOUNTER — Encounter: Payer: Self-pay | Admitting: Family

## 2017-09-03 ENCOUNTER — Encounter (HOSPITAL_COMMUNITY): Payer: Self-pay | Admitting: Emergency Medicine

## 2017-09-03 ENCOUNTER — Emergency Department (HOSPITAL_COMMUNITY)
Admission: EM | Admit: 2017-09-03 | Discharge: 2017-09-03 | Disposition: A | Discharge status: Home / Self Care | Payer: Federal, State, Local not specified - PPO | Attending: Emergency Medicine | Admitting: Emergency Medicine

## 2017-09-03 ENCOUNTER — Ambulatory Visit (HOSPITAL_COMMUNITY): Payer: Self-pay | Admitting: Licensed Clinical Social Worker

## 2017-09-03 DIAGNOSIS — R197 Diarrhea, unspecified: Secondary | ICD-10-CM | POA: Diagnosis not present

## 2017-09-03 DIAGNOSIS — Z5321 Procedure and treatment not carried out due to patient leaving prior to being seen by health care provider: Secondary | ICD-10-CM | POA: Diagnosis not present

## 2017-09-03 LAB — COMPREHENSIVE METABOLIC PANEL
ALT: 50 U/L (ref 17–63)
AST: 33 U/L (ref 15–41)
Albumin: 4.2 g/dL (ref 3.5–5.0)
Alkaline Phosphatase: 81 U/L (ref 38–126)
Anion gap: 10 (ref 5–15)
BUN: 10 mg/dL (ref 6–20)
CHLORIDE: 105 mmol/L (ref 101–111)
CO2: 27 mmol/L (ref 22–32)
Calcium: 9.2 mg/dL (ref 8.9–10.3)
Creatinine, Ser: 1.03 mg/dL (ref 0.61–1.24)
Glucose, Bld: 95 mg/dL (ref 65–99)
POTASSIUM: 4.3 mmol/L (ref 3.5–5.1)
Sodium: 142 mmol/L (ref 135–145)
Total Bilirubin: 0.7 mg/dL (ref 0.3–1.2)
Total Protein: 7.4 g/dL (ref 6.5–8.1)

## 2017-09-03 LAB — CBC
HEMATOCRIT: 48.2 % (ref 39.0–52.0)
Hemoglobin: 17.1 g/dL — ABNORMAL HIGH (ref 13.0–17.0)
MCH: 32.1 pg (ref 26.0–34.0)
MCHC: 35.5 g/dL (ref 30.0–36.0)
MCV: 90.6 fL (ref 78.0–100.0)
Platelets: 151 10*3/uL (ref 150–400)
RBC: 5.32 MIL/uL (ref 4.22–5.81)
RDW: 12.8 % (ref 11.5–15.5)
WBC: 5.7 10*3/uL (ref 4.0–10.5)

## 2017-09-03 LAB — URINALYSIS, ROUTINE W REFLEX MICROSCOPIC
Bilirubin Urine: NEGATIVE
GLUCOSE, UA: NEGATIVE mg/dL
Hgb urine dipstick: NEGATIVE
Ketones, ur: NEGATIVE mg/dL
LEUKOCYTES UA: NEGATIVE
Nitrite: NEGATIVE
PH: 8 (ref 5.0–8.0)
PROTEIN: NEGATIVE mg/dL
Specific Gravity, Urine: 1.023 (ref 1.005–1.030)

## 2017-09-03 LAB — LIPASE, BLOOD: LIPASE: 59 U/L — AB (ref 11–51)

## 2017-09-03 MED ORDER — ONDANSETRON 4 MG PO TBDP
4.0000 mg | ORAL_TABLET | Freq: Once | ORAL | Status: AC | PRN
  Administered 2017-09-03: 4 mg via ORAL
  Filled 2017-09-03: qty 1

## 2017-09-03 NOTE — ED Triage Notes (Signed)
Patient reports waking up at 10am with n/v/d, cold chills, cough with phlegm.

## 2017-09-04 ENCOUNTER — Ambulatory Visit (HOSPITAL_COMMUNITY): Payer: Self-pay | Admitting: Psychiatry

## 2017-09-05 NOTE — Progress Notes (Unsigned)
[facility]

## 2017-09-10 ENCOUNTER — Ambulatory Visit (HOSPITAL_COMMUNITY): Payer: Self-pay | Admitting: Psychiatry

## 2017-10-04 ENCOUNTER — Ambulatory Visit (HOSPITAL_COMMUNITY): Payer: Medicare Other | Admitting: Psychiatry

## 2017-10-04 DIAGNOSIS — K080 Exfoliation of teeth due to systemic causes: Secondary | ICD-10-CM | POA: Diagnosis not present

## 2017-10-15 ENCOUNTER — Ambulatory Visit (INDEPENDENT_AMBULATORY_CARE_PROVIDER_SITE_OTHER): Payer: Federal, State, Local not specified - PPO | Admitting: Licensed Clinical Social Worker

## 2017-10-15 ENCOUNTER — Encounter (HOSPITAL_COMMUNITY): Payer: Self-pay | Admitting: Licensed Clinical Social Worker

## 2017-10-15 DIAGNOSIS — F4310 Post-traumatic stress disorder, unspecified: Secondary | ICD-10-CM

## 2017-10-15 DIAGNOSIS — F3160 Bipolar disorder, current episode mixed, unspecified: Secondary | ICD-10-CM | POA: Diagnosis not present

## 2017-10-15 NOTE — Progress Notes (Signed)
   THERAPIST PROGRESS NOTE  Session Time: 1:30pm-2:30pm  Participation Level: Active  Behavioral Response: Well GroomedAlertEuthymic  Type of Therapy: Individual Therapy  Treatment Goals addressed: Improve psychiatric symptoms, elevate mood (decreased irritability, increased enjoyment of activities), Improve unhelpful thought patterns, emotional regulation skills (reduce temper outburst), Interpersonal relationship skills  Interventions: Motivational Interviewing and Other: Grounding and Mindfulness techniques  Summary: Curtis Townsend is a 42 y.o. male who presents with PTSD and Bipolar I Disorder, mixed, moderate  Suicidal/Homicidal: No without intent/plan  Therapist Response:  Curtis Townsend met with clinician for individual therapy. Curtis Townsend discussed his psychiatric symptoms and current life events. Curtis Townsend shared that he has been doing pretty well over the past few months. 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 Clinician explored events leading up to this decision and identified the importance of maintaining his composure during stressful times, especially in order to preserve his reputation. Curtis Townsend identified several incidents where he was able to pause and make different decisions or walk away, rather than engage in confrontations with customers. Curtis Townsend reports he is starting to understand the choices he can make in his life to make things better or worse for himself. Curtis Townsend also identified external sources of anger, especially friends, who want to manipulate him and lead him to make bad choices. Clinician reminded Curtis Townsend that he can stay away from things that are "bad for" him and encouraged Curtis Townsend to focus on his family and personal affairs before pledging assistance to others.   Plan: Return again in 3-4 weeks.  Diagnosis:     Axis I: PTSD and Bipolar I Disorder, mixed, moderate  [name],  LCSW 10/15/2017

## 2017-11-03 ENCOUNTER — Emergency Department (HOSPITAL_COMMUNITY)
Admission: EM | Admit: 2017-11-03 | Discharge: 2017-11-03 | Disposition: A | Discharge status: Home / Self Care | Payer: Federal, State, Local not specified - PPO | Attending: Emergency Medicine | Admitting: Emergency Medicine

## 2017-11-03 ENCOUNTER — Encounter (HOSPITAL_COMMUNITY): Payer: Self-pay

## 2017-11-03 ENCOUNTER — Emergency Department (HOSPITAL_COMMUNITY): Payer: Federal, State, Local not specified - PPO

## 2017-11-03 ENCOUNTER — Other Ambulatory Visit: Payer: Self-pay

## 2017-11-03 DIAGNOSIS — Y999 Unspecified external cause status: Secondary | ICD-10-CM | POA: Diagnosis not present

## 2017-11-03 DIAGNOSIS — W109XXA Fall (on) (from) unspecified stairs and steps, initial encounter: Secondary | ICD-10-CM | POA: Diagnosis not present

## 2017-11-03 DIAGNOSIS — M79605 Pain in left leg: Secondary | ICD-10-CM | POA: Diagnosis not present

## 2017-11-03 DIAGNOSIS — M79652 Pain in left thigh: Secondary | ICD-10-CM | POA: Diagnosis not present

## 2017-11-03 DIAGNOSIS — S59911A Unspecified injury of right forearm, initial encounter: Secondary | ICD-10-CM | POA: Diagnosis not present

## 2017-11-03 DIAGNOSIS — Y929 Unspecified place or not applicable: Secondary | ICD-10-CM | POA: Diagnosis not present

## 2017-11-03 DIAGNOSIS — M795 Residual foreign body in soft tissue: Secondary | ICD-10-CM

## 2017-11-03 DIAGNOSIS — E119 Type 2 diabetes mellitus without complications: Secondary | ICD-10-CM | POA: Insufficient documentation

## 2017-11-03 DIAGNOSIS — R1030 Lower abdominal pain, unspecified: Secondary | ICD-10-CM | POA: Diagnosis not present

## 2017-11-03 DIAGNOSIS — S3992XA Unspecified injury of lower back, initial encounter: Secondary | ICD-10-CM | POA: Diagnosis not present

## 2017-11-03 DIAGNOSIS — S99922A Unspecified injury of left foot, initial encounter: Secondary | ICD-10-CM | POA: Diagnosis not present

## 2017-11-03 DIAGNOSIS — Y9301 Activity, walking, marching and hiking: Secondary | ICD-10-CM | POA: Insufficient documentation

## 2017-11-03 DIAGNOSIS — M25531 Pain in right wrist: Secondary | ICD-10-CM | POA: Diagnosis not present

## 2017-11-03 DIAGNOSIS — M79601 Pain in right arm: Secondary | ICD-10-CM

## 2017-11-03 DIAGNOSIS — R109 Unspecified abdominal pain: Secondary | ICD-10-CM | POA: Diagnosis not present

## 2017-11-03 DIAGNOSIS — S79912A Unspecified injury of left hip, initial encounter: Secondary | ICD-10-CM | POA: Diagnosis not present

## 2017-11-03 DIAGNOSIS — R1032 Left lower quadrant pain: Secondary | ICD-10-CM | POA: Diagnosis not present

## 2017-11-03 DIAGNOSIS — F17210 Nicotine dependence, cigarettes, uncomplicated: Secondary | ICD-10-CM | POA: Insufficient documentation

## 2017-11-03 DIAGNOSIS — M79672 Pain in left foot: Secondary | ICD-10-CM | POA: Diagnosis not present

## 2017-11-03 DIAGNOSIS — M79662 Pain in left lower leg: Secondary | ICD-10-CM | POA: Diagnosis not present

## 2017-11-03 DIAGNOSIS — S3991XA Unspecified injury of abdomen, initial encounter: Secondary | ICD-10-CM | POA: Diagnosis not present

## 2017-11-03 DIAGNOSIS — W19XXXA Unspecified fall, initial encounter: Secondary | ICD-10-CM

## 2017-11-03 DIAGNOSIS — M549 Dorsalgia, unspecified: Secondary | ICD-10-CM | POA: Diagnosis not present

## 2017-11-03 DIAGNOSIS — M79631 Pain in right forearm: Secondary | ICD-10-CM | POA: Diagnosis not present

## 2017-11-03 DIAGNOSIS — S8992XA Unspecified injury of left lower leg, initial encounter: Secondary | ICD-10-CM | POA: Diagnosis not present

## 2017-11-03 LAB — CBC
HCT: 47.4 % (ref 39.0–52.0)
Hemoglobin: 16.6 g/dL (ref 13.0–17.0)
MCH: 32.2 pg (ref 26.0–34.0)
MCHC: 35 g/dL (ref 30.0–36.0)
MCV: 92 fL (ref 78.0–100.0)
Platelets: 181 10*3/uL (ref 150–400)
RBC: 5.15 MIL/uL (ref 4.22–5.81)
RDW: 12.9 % (ref 11.5–15.5)
WBC: 5.6 10*3/uL (ref 4.0–10.5)

## 2017-11-03 LAB — COMPREHENSIVE METABOLIC PANEL
ALBUMIN: 4.3 g/dL (ref 3.5–5.0)
ALK PHOS: 69 U/L (ref 38–126)
ALT: 28 U/L (ref 17–63)
ANION GAP: 12 (ref 5–15)
AST: 30 U/L (ref 15–41)
BILIRUBIN TOTAL: 1.4 mg/dL — AB (ref 0.3–1.2)
BUN: 15 mg/dL (ref 6–20)
CALCIUM: 9.6 mg/dL (ref 8.9–10.3)
CO2: 23 mmol/L (ref 22–32)
CREATININE: 1.08 mg/dL (ref 0.61–1.24)
Chloride: 106 mmol/L (ref 101–111)
GFR calc Af Amer: >60 mL/min (ref >60)
GFR calc non Af Amer: >60 mL/min (ref >60)
GLUCOSE: 87 mg/dL (ref 65–99)
Potassium: 3.8 mmol/L (ref 3.5–5.1)
Sodium: 141 mmol/L (ref 135–145)
TOTAL PROTEIN: 7.4 g/dL (ref 6.5–8.1)

## 2017-11-03 LAB — URINALYSIS, ROUTINE W REFLEX MICROSCOPIC
BILIRUBIN URINE: NEGATIVE
Bacteria, UA: NONE SEEN
Glucose, UA: NEGATIVE mg/dL
HGB URINE DIPSTICK: NEGATIVE
Ketones, ur: 20 mg/dL — AB
LEUKOCYTES UA: NEGATIVE
NITRITE: NEGATIVE
PH: 6 (ref 5.0–8.0)
Protein, ur: NEGATIVE mg/dL
Specific Gravity, Urine: 1.031 — ABNORMAL HIGH (ref 1.005–1.030)

## 2017-11-03 MED ORDER — IOPAMIDOL (ISOVUE-300) INJECTION 61%
INTRAVENOUS | Status: AC
  Filled 2017-11-03: qty 100

## 2017-11-03 MED ORDER — MORPHINE SULFATE (PF) 4 MG/ML IV SOLN
4.0000 mg | Freq: Once | INTRAVENOUS | Status: AC
  Administered 2017-11-03: 4 mg via INTRAVENOUS
  Filled 2017-11-03: qty 1

## 2017-11-03 MED ORDER — IOPAMIDOL (ISOVUE-300) INJECTION 61%
100.0000 mL | Freq: Once | INTRAVENOUS | Status: AC | PRN
  Administered 2017-11-03: 100 mL via INTRAVENOUS

## 2017-11-03 MED ORDER — METHOCARBAMOL 750 MG PO TABS: 750.0000 mg | ORAL_TABLET | Freq: Three times a day (TID) | ORAL | 0 refills | Status: DC | PRN

## 2017-11-03 MED ORDER — SODIUM CHLORIDE 0.9 % IV BOLUS
1000.0000 mL | Freq: Once | INTRAVENOUS | Status: AC
  Administered 2017-11-03: 1000 mL via INTRAVENOUS

## 2017-11-03 MED ORDER — SODIUM CHLORIDE 0.9 % IJ SOLN
INTRAMUSCULAR | Status: AC
  Administered 2017-11-03: 16:00:00
  Filled 2017-11-03: qty 50

## 2017-11-03 NOTE — Discharge Instructions (Signed)
Please take Ibuprofen (Advil, motrin) and Tylenol (acetaminophen) to relieve your pain.  You may take up to 600 MG (3 pills) of normal strength ibuprofen every 8 hours as needed.  In between doses of ibuprofen you make take tylenol, up to 1,000 mg (two extra strength pills).  Do not take more than 3,000 mg tylenol in a 24 hour period.  Please check all medication labels as many medications such as pain and cold medications may contain tylenol.  Do not drink alcohol while taking these medications.  Do not take other NSAID'S while taking ibuprofen (such as aleve or naproxen).  Please take ibuprofen with food to decrease stomach upset.  Today you received medications that may make you sleepy or impair your ability to make decisions.  For the next 24 hours please do not drive, operate heavy machinery, care for a small child with out another adult present, or perform any activities that may cause harm to you or someone else if you were to fall asleep or be impaired.   You are being prescribed a medication which may make you sleepy. Please follow up of listed precautions for at least 24 hours after taking one dose.  If you develop headaches with blurry vision, vomiting, confusion, any weakness, numbness, or tingling or have any concerns then please seek additional medical care and evaluation.

## 2017-11-03 NOTE — ED Notes (Signed)
Bed: [facility] Expected date:  Expected time:  Means of arrival:  Comments: 

## 2017-11-03 NOTE — ED Triage Notes (Signed)
Pt comes from home. Pt arrived via [facility]. Pt was carying tools down stairs and ended up sliding down six steps with feet above head. Pt stated he did hit head but no LOC. Pt has is AOx4 and ambulatory. Pt was not placed in [facility] due to pt getting up once [facility] arrived on scene.

## 2017-11-03 NOTE — ED Notes (Signed)
Pyxis is not allowing RN to take out Morphine. I have informed Pharmacy Tech and they are now calling main pharmacy for assistance.

## 2017-11-03 NOTE — ED Notes (Signed)
Patient transported to X-ray 
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
## 2017-11-12 ENCOUNTER — Encounter (HOSPITAL_COMMUNITY): Payer: Self-pay | Admitting: Licensed Clinical Social Worker

## 2017-11-12 ENCOUNTER — Ambulatory Visit (INDEPENDENT_AMBULATORY_CARE_PROVIDER_SITE_OTHER): Payer: Federal, State, Local not specified - PPO | Admitting: Licensed Clinical Social Worker

## 2017-11-12 DIAGNOSIS — F3163 Bipolar disorder, current episode mixed, severe, without psychotic features: Secondary | ICD-10-CM

## 2017-11-12 DIAGNOSIS — F4310 Post-traumatic stress disorder, unspecified: Secondary | ICD-10-CM
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
## 2017-11-22 ENCOUNTER — Encounter (HOSPITAL_COMMUNITY): Payer: Self-pay | Admitting: Psychiatry

## 2017-11-22 ENCOUNTER — Ambulatory Visit (INDEPENDENT_AMBULATORY_CARE_PROVIDER_SITE_OTHER): Payer: Federal, State, Local not specified - PPO | Admitting: Psychiatry

## 2017-11-22 DIAGNOSIS — Z6379 Other stressful life events affecting family and household: Secondary | ICD-10-CM | POA: Diagnosis not present

## 2017-11-22 DIAGNOSIS — M549 Dorsalgia, unspecified: Secondary | ICD-10-CM

## 2017-11-22 DIAGNOSIS — F3163 Bipolar disorder, current episode mixed, severe, without psychotic features: Secondary | ICD-10-CM | POA: Diagnosis not present

## 2017-11-22 DIAGNOSIS — Z9114 Patient's other noncompliance with medication regimen: Secondary | ICD-10-CM | POA: Diagnosis not present

## 2017-11-22 DIAGNOSIS — Z62811 Personal history of psychological abuse in childhood: Secondary | ICD-10-CM

## 2017-11-22 DIAGNOSIS — Z915 Personal history of self-harm: Secondary | ICD-10-CM | POA: Diagnosis not present

## 2017-11-22 DIAGNOSIS — G4700 Insomnia, unspecified: Secondary | ICD-10-CM | POA: Diagnosis not present

## 2017-11-22 DIAGNOSIS — F4310 Post-traumatic stress disorder, unspecified: Secondary | ICD-10-CM | POA: Diagnosis not present

## 2017-11-22 DIAGNOSIS — F17210 Nicotine dependence, cigarettes, uncomplicated: Secondary | ICD-10-CM

## 2017-11-22 DIAGNOSIS — Z62810 Personal history of physical and sexual abuse in childhood: Secondary | ICD-10-CM

## 2017-11-22 MED ORDER — FLUOXETINE HCL 40 MG PO CAPS: 40.0000 mg | ORAL_CAPSULE | Freq: Every day | ORAL | 0 refills | Status: DC

## 2017-11-22 MED ORDER — DIVALPROEX SODIUM ER 500 MG PO TB24: 500.0000 mg | ORAL_TABLET | Freq: Every day | ORAL | 0 refills | Status: DC
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
## 2017-11-26 ENCOUNTER — Ambulatory Visit (HOSPITAL_COMMUNITY): Payer: Self-pay | Admitting: Licensed Clinical Social Worker

## 2017-12-10 ENCOUNTER — Ambulatory Visit (INDEPENDENT_AMBULATORY_CARE_PROVIDER_SITE_OTHER): Payer: Federal, State, Local not specified - PPO | Admitting: Licensed Clinical Social Worker

## 2017-12-10 ENCOUNTER — Encounter (HOSPITAL_COMMUNITY): Payer: Self-pay | Admitting: Licensed Clinical Social Worker

## 2017-12-10 DIAGNOSIS — F4310 Post-traumatic stress disorder, unspecified: Secondary | ICD-10-CM | POA: Diagnosis not present

## 2017-12-10 DIAGNOSIS — F3163 Bipolar disorder, current episode mixed, severe, without psychotic features: Secondary | ICD-10-CM
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
## 2017-12-13 ENCOUNTER — Ambulatory Visit (HOSPITAL_COMMUNITY): Payer: Medicare Other | Admitting: Psychiatry

## 2017-12-24 ENCOUNTER — Encounter (HOSPITAL_COMMUNITY): Payer: Self-pay | Admitting: Licensed Clinical Social Worker

## 2017-12-24 ENCOUNTER — Ambulatory Visit (INDEPENDENT_AMBULATORY_CARE_PROVIDER_SITE_OTHER): Payer: Federal, State, Local not specified - PPO | Admitting: Licensed Clinical Social Worker

## 2017-12-24 DIAGNOSIS — F3163 Bipolar disorder, current episode mixed, severe, without psychotic features: Secondary | ICD-10-CM

## 2017-12-24 DIAGNOSIS — F4310 Post-traumatic stress disorder, unspecified: Secondary | ICD-10-CM
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
## 2017-12-26 ENCOUNTER — Ambulatory Visit: Payer: Medicare Other | Admitting: Internal Medicine

## 2017-12-31 ENCOUNTER — Encounter (HOSPITAL_COMMUNITY): Payer: Self-pay | Admitting: Emergency Medicine

## 2017-12-31 ENCOUNTER — Emergency Department (HOSPITAL_COMMUNITY)
Admission: EM | Admit: 2017-12-31 | Discharge: 2017-12-31 | Disposition: A | Discharge status: Home / Self Care | Payer: Federal, State, Local not specified - PPO | Attending: Emergency Medicine | Admitting: Emergency Medicine

## 2017-12-31 ENCOUNTER — Other Ambulatory Visit: Payer: Self-pay

## 2017-12-31 ENCOUNTER — Ambulatory Visit: Payer: Medicare Other | Admitting: Internal Medicine

## 2017-12-31 ENCOUNTER — Emergency Department (HOSPITAL_COMMUNITY): Payer: Federal, State, Local not specified - PPO

## 2017-12-31 DIAGNOSIS — E119 Type 2 diabetes mellitus without complications: Secondary | ICD-10-CM | POA: Diagnosis not present

## 2017-12-31 DIAGNOSIS — R112 Nausea with vomiting, unspecified: Secondary | ICD-10-CM | POA: Diagnosis not present

## 2017-12-31 DIAGNOSIS — R42 Dizziness and giddiness: Secondary | ICD-10-CM | POA: Diagnosis not present

## 2017-12-31 DIAGNOSIS — Z79899 Other long term (current) drug therapy: Secondary | ICD-10-CM | POA: Diagnosis not present

## 2017-12-31 DIAGNOSIS — F17210 Nicotine dependence, cigarettes, uncomplicated: Secondary | ICD-10-CM | POA: Diagnosis not present

## 2017-12-31 DIAGNOSIS — R0789 Other chest pain: Secondary | ICD-10-CM | POA: Diagnosis not present

## 2017-12-31 DIAGNOSIS — R1111 Vomiting without nausea: Secondary | ICD-10-CM | POA: Diagnosis not present

## 2017-12-31 DIAGNOSIS — R002 Palpitations: Secondary | ICD-10-CM | POA: Diagnosis not present

## 2017-12-31 DIAGNOSIS — R079 Chest pain, unspecified: Secondary | ICD-10-CM | POA: Diagnosis not present

## 2017-12-31 LAB — URINALYSIS, ROUTINE W REFLEX MICROSCOPIC
Bilirubin Urine: NEGATIVE
GLUCOSE, UA: NEGATIVE mg/dL
Hgb urine dipstick: NEGATIVE
KETONES UR: 5 mg/dL — AB
LEUKOCYTES UA: NEGATIVE
Nitrite: NEGATIVE
PH: 5 (ref 5.0–8.0)
Protein, ur: NEGATIVE mg/dL
SPECIFIC GRAVITY, URINE: 1.029 (ref 1.005–1.030)

## 2017-12-31 LAB — CBC
HCT: 48.5 % (ref 39.0–52.0)
HEMOGLOBIN: 16.2 g/dL (ref 13.0–17.0)
MCH: 31 pg (ref 26.0–34.0)
MCHC: 33.4 g/dL (ref 30.0–36.0)
MCV: 92.7 fL (ref 78.0–100.0)
PLATELETS: 189 10*3/uL (ref 150–400)
RBC: 5.23 MIL/uL (ref 4.22–5.81)
RDW: 12 % (ref 11.5–15.5)
WBC: 4.6 10*3/uL (ref 4.0–10.5)

## 2017-12-31 LAB — RAPID URINE DRUG SCREEN, HOSP PERFORMED
Amphetamines: NOT DETECTED
BENZODIAZEPINES: NOT DETECTED
COCAINE: NOT DETECTED
Opiates: NOT DETECTED
TETRAHYDROCANNABINOL: POSITIVE — AB

## 2017-12-31 LAB — BASIC METABOLIC PANEL
Anion gap: 7 (ref 5–15)
BUN: 13 mg/dL (ref 6–20)
CALCIUM: 9.5 mg/dL (ref 8.9–10.3)
CO2: 26 mmol/L (ref 22–32)
Chloride: 106 mmol/L (ref 101–111)
Creatinine, Ser: 1.14 mg/dL (ref 0.61–1.24)
GFR calc Af Amer: >60 mL/min (ref >60)
GFR calc non Af Amer: >60 mL/min (ref >60)
GLUCOSE: 82 mg/dL (ref 65–99)
Potassium: 5.8 mmol/L — ABNORMAL HIGH (ref 3.5–5.1)
Sodium: 139 mmol/L (ref 135–145)

## 2017-12-31 LAB — I-STAT TROPONIN, ED: TROPONIN I, POC: 0 ng/mL (ref 0.00–0.08)

## 2017-12-31 LAB — VALPROIC ACID LEVEL: Valproic Acid Lvl: <10 ug/mL — ABNORMAL LOW (ref 50.0–100.0)

## 2017-12-31 MED ORDER — SODIUM CHLORIDE 0.9 % IV BOLUS
1000.0000 mL | Freq: Once | INTRAVENOUS | Status: AC
  Administered 2017-12-31: 1000 mL via INTRAVENOUS

## 2017-12-31 MED ORDER — KETOROLAC TROMETHAMINE 30 MG/ML IJ SOLN
30.0000 mg | Freq: Once | INTRAMUSCULAR | Status: AC
  Administered 2017-12-31: 30 mg via INTRAVENOUS
  Filled 2017-12-31: qty 1

## 2017-12-31 MED ORDER — KETOROLAC TROMETHAMINE 10 MG PO TABS: 10.0000 mg | ORAL_TABLET | Freq: Four times a day (QID) | ORAL | 0 refills | Status: DC | PRN

## 2017-12-31 MED ORDER — LORAZEPAM 2 MG/ML IJ SOLN
1.0000 mg | Freq: Once | INTRAMUSCULAR | Status: AC
  Administered 2017-12-31: 1 mg via INTRAVENOUS
  Filled 2017-12-31: qty 1

## 2017-12-31 NOTE — ED Notes (Signed)
Patient verbalizes understanding of discharge instructions. Opportunity for questioning and answers were provided. Armband removed by staff, pt discharged from ED in wheelchair.  

## 2017-12-31 NOTE — ED Provider Notes (Signed)
[facility] EMERGENCY DEPARTMENT Provider Note   CSN: [id number] Arrival date & time: 12/31/17  1525     History   Chief Complaint Chief Complaint  Patient presents with  . Chest Pain    HPI Curtis Townsend is a 42 y.o. male.  Pt presents to the ED today with cp.  The pt said it started on 6/21.  It hurts to move and to take a deep breath.  It started after smoking MJ.  He is not sure if anything else was in it.  He does not knowingly do other drugs.  Pt also c/o palpitations.  He smokes cigarettes and drinks a lot of coffee.  He feels angry all the time, and when he feels angry, his heart does more palpitations.     Past Medical History:  Diagnosis Date  . Alcoholism ([place])   . Anxiety   . Bipolar 1 disorder ([place])   . Depression   . Depression   . Diabetes ([place])   . History of chicken pox   . History of kidney stones   . Hyperlipemia    no current med.  . IBS (irritable bowel syndrome)   . Lipoma of back 05/2012   right  . Seasonal allergies   . Wears partial dentures    upper    Patient Active Problem List   Diagnosis Date Noted  . Foreign body (FB) in soft tissue 11/03/2017  . Pure hypercholesterolemia 03/14/2017  . Pure hyperglyceridemia 03/14/2017  . Family history of colon cancer in father 03/14/2017  . B12 deficiency 06/28/2016  . Vitamin B12 deficiency neuropathy ([place]) 06/08/2016  . Routine general medical examination at a health care facility 01/27/2015  . Chronic shoulder pain 04/22/2014  . DOE (dyspnea on exertion) 02/12/2014  . Bipolar I disorder ([place]) 12/29/2013  . Neurosis, posttraumatic 12/29/2013  . Bipolar affective disorder, depressed ([place]) 08/04/2013    Past Surgical History:  Procedure Laterality Date  . LIPOMA EXCISION  06/10/2012   Procedure: EXCISION LIPOMA;  Surgeon: Ralene Ok, MD;  Location: [facility];  Service: General;  Laterality: Right;  excision of back lipoma on right 3x3cm         Home Medications    Prior to Admission medications   Medication Sig Start Date End Date Taking? Authorizing Provider  divalproex (DEPAKOTE ER) 500 MG 24 hr tablet Take 1 tablet (500 mg total) by mouth daily. 11/22/17 11/22/18  Arfeen, Arlyce Harman, MD  FLUoxetine (PROZAC) 40 MG capsule Take 1 capsule (40 mg total) by mouth daily. 11/22/17   Arfeen, Arlyce Harman, MD  ibuprofen (ADVIL,MOTRIN) 200 MG tablet Take 200 mg by mouth daily as needed (pain).    [provider]  ketorolac (TORADOL) 10 MG tablet Take 1 tablet (10 mg total) by mouth every 6 (six) hours as needed. 12/31/17   Isla Pence, MD  methocarbamol (ROBAXIN) 750 MG tablet Take 1-2 tablets (750-1,500 mg total) by mouth 3 (three) times daily as needed for muscle spasms. 11/03/17   Lorin Glass, PA-C    Family History Family History  Problem Relation Age of Onset  . Stroke Mother   . Hypertension Mother   . Diabetes Mother   . Hypertension Father   . Diabetes Father   . Cancer Paternal Uncle        Prostate Cancer    Social History Social History   Tobacco Use  . Smoking status: Current Some Day Smoker    Packs/day: 0.10  Years: 16.00    Pack years: 1.60    Types: Cigarettes  . Smokeless tobacco: Never Used  . Tobacco comment: 1-2 cig./month  Substance Use Topics  . Alcohol use: Yes    Alcohol/week: 0.0 oz    Comment: Occasional use  . Drug use: No    Comment: states daily THC     Allergies   Patient has no known allergies.   Review of Systems Review of Systems  Cardiovascular: Positive for chest pain.  All other systems reviewed and are negative.    Physical Exam Updated Vital Signs BP 112/75   Pulse 66   Temp 98 F (36.7 C) (Oral)   Resp 20   Ht 6\' 1"  (1.854 m)   Wt 87.5 kg (193 lb)   SpO2 98%   BMI 25.46 kg/m   Physical Exam  Constitutional: He appears well-developed and well-nourished.  HENT:  Head: Normocephalic and atraumatic.  Right Ear: External ear normal.   Left Ear: External ear normal.  Nose: Nose normal.  Mouth/Throat: Oropharynx is clear and moist.  Eyes: Pupils are equal, round, and reactive to light. Conjunctivae and EOM are normal.  Neck: Normal range of motion. Neck supple.  Cardiovascular: Normal rate, regular rhythm, normal heart sounds and intact distal pulses.  Pulmonary/Chest: Effort normal and breath sounds normal.        Abdominal: Soft. Bowel sounds are normal.  Musculoskeletal: Normal range of motion.  Neurological: He is alert.  Skin: Skin is warm. Capillary refill takes less than 2 seconds.  Psychiatric: He has a normal mood and affect. His behavior is normal. Judgment and thought content normal.  Nursing note and vitals reviewed.    ED Treatments / Results  Labs (all labs ordered are listed, but only abnormal results are displayed) Labs Reviewed  BASIC METABOLIC PANEL - Abnormal; Notable for the following components:      Result Value   Potassium 5.8 (*)    All other components within normal limits  URINALYSIS, ROUTINE W REFLEX MICROSCOPIC - Abnormal; Notable for the following components:   Ketones, ur 5 (*)    All other components within normal limits  RAPID URINE DRUG SCREEN, HOSP PERFORMED - Abnormal; Notable for the following components:   Tetrahydrocannabinol POSITIVE (*)    Barbiturates   (*)    Value: Result not available. Reagent lot number recalled by manufacturer.   All other components within normal limits  VALPROIC ACID LEVEL - Abnormal; Notable for the following components:   Valproic Acid Lvl <10 (*)    All other components within normal limits  CBC  HIV ANTIBODY (ROUTINE TESTING)  I-STAT TROPONIN, ED    EKG EKG Interpretation  Date/Time:  Monday December 31 2017 15:30:15 EDT Ventricular Rate:  73 PR Interval:    QRS Duration: 80 QT Interval:  377 QTC Calculation: 416 R Axis:   67 Text Interpretation:  Sinus rhythm Confirmed by Isla Pence [phone]) on 12/31/2017 3:34:56  PM   Radiology Dg Chest 2 View  Result Date: 12/31/2017 CLINICAL DATA:  Chest pain. EXAM: CHEST - 2 VIEW COMPARISON:  Radiographs of October 09, 2016. FINDINGS: The heart size and mediastinal contours are within normal limits. Both lungs are clear. No pneumothorax or pleural effusion is noted. The visualized skeletal structures are unremarkable. IMPRESSION: No active cardiopulmonary disease. Electronically Signed   By: Marijo Conception, M.D.   On: 12/31/2017 16:03    Procedures Procedures (including critical care time)  Medications Ordered in ED Medications  sodium chloride 0.9 % bolus 1,000 mL (0 mLs Intravenous Stopped 12/31/17 1749)  ketorolac (TORADOL) 30 MG/ML injection 30 mg (30 mg Intravenous Given 12/31/17 1615)  LORazepam (ATIVAN) injection 1 mg (1 mg Intravenous Given 12/31/17 1616)     Initial Impression / Assessment and Plan / ED Course  I have reviewed the triage vital signs and the nursing notes.  Pertinent labs & imaging results that were available during my care of the patient were reviewed by me and considered in my medical decision making (see chart for details).     Pt is feeling much better.  I have low suspicion for CAD.  Neg trop.  Neg ekg.  Pain with palpation.  Pt instructed to return if worse and f/u with pcp.  Final Clinical Impressions(s) / ED Diagnoses   Final diagnoses:  Atypical chest pain    ED Discharge Orders        Ordered    ketorolac (TORADOL) 10 MG tablet  Every 6 hours PRN     12/31/17 1852       Isla Pence, MD 12/31/17 1853

## 2017-12-31 NOTE — ED Triage Notes (Signed)
Pt arrives to ED from [facility] with complaints of intermittent chest pain since friday. EMS reports pt started having chest pain Friday after drinking alcohol. Pt reports he was also "smoking weed" pt is very anxious and states he has had many stressors in his life lately. EMS gave 4mg  Zofran and 162mg  Aspirin. Pt took 162mg  aspirin prior to EMS arrival. Pt reports chest pain 5/10. Pt placed in position of comfort with bed locked and lowered, call bell in reach.

## 2018-01-01 LAB — HIV ANTIBODY (ROUTINE TESTING W REFLEX): HIV Screen 4th Generation wRfx: NONREACTIVE

## 2018-01-02 ENCOUNTER — Other Ambulatory Visit: Payer: Self-pay

## 2018-01-02 ENCOUNTER — Emergency Department (HOSPITAL_COMMUNITY): Payer: Federal, State, Local not specified - PPO

## 2018-01-02 ENCOUNTER — Encounter (HOSPITAL_COMMUNITY): Payer: Self-pay | Admitting: Emergency Medicine

## 2018-01-02 ENCOUNTER — Emergency Department (HOSPITAL_COMMUNITY)
Admission: EM | Admit: 2018-01-02 | Discharge: 2018-01-03 | Disposition: A | Discharge status: Home / Self Care | Payer: Federal, State, Local not specified - PPO | Attending: Emergency Medicine | Admitting: Emergency Medicine

## 2018-01-02 DIAGNOSIS — Z79899 Other long term (current) drug therapy: Secondary | ICD-10-CM | POA: Diagnosis not present

## 2018-01-02 DIAGNOSIS — E119 Type 2 diabetes mellitus without complications: Secondary | ICD-10-CM | POA: Insufficient documentation

## 2018-01-02 DIAGNOSIS — R42 Dizziness and giddiness: Secondary | ICD-10-CM | POA: Diagnosis not present

## 2018-01-02 DIAGNOSIS — R4701 Aphasia: Secondary | ICD-10-CM | POA: Insufficient documentation

## 2018-01-02 DIAGNOSIS — R531 Weakness: Secondary | ICD-10-CM | POA: Diagnosis not present

## 2018-01-02 DIAGNOSIS — Z7982 Long term (current) use of aspirin: Secondary | ICD-10-CM | POA: Diagnosis not present

## 2018-01-02 DIAGNOSIS — F17210 Nicotine dependence, cigarettes, uncomplicated: Secondary | ICD-10-CM | POA: Insufficient documentation

## 2018-01-02 DIAGNOSIS — R202 Paresthesia of skin: Secondary | ICD-10-CM | POA: Diagnosis not present

## 2018-01-02 LAB — CBC
HCT: 48.1 % (ref 39.0–52.0)
HEMOGLOBIN: 16.6 g/dL (ref 13.0–17.0)
MCH: 31.5 pg (ref 26.0–34.0)
MCHC: 34.5 g/dL (ref 30.0–36.0)
MCV: 91.3 fL (ref 78.0–100.0)
Platelets: 180 10*3/uL (ref 150–400)
RBC: 5.27 MIL/uL (ref 4.22–5.81)
RDW: 12.3 % (ref 11.5–15.5)
WBC: 6 10*3/uL (ref 4.0–10.5)

## 2018-01-02 LAB — BASIC METABOLIC PANEL
Anion gap: 7 (ref 5–15)
BUN: 14 mg/dL (ref 6–20)
CALCIUM: 9.2 mg/dL (ref 8.9–10.3)
CO2: 24 mmol/L (ref 22–32)
CREATININE: 1.12 mg/dL (ref 0.61–1.24)
Chloride: 110 mmol/L (ref 98–111)
GFR calc Af Amer: >60 mL/min (ref >60)
GLUCOSE: 144 mg/dL — AB (ref 70–99)
Potassium: 3.9 mmol/L (ref 3.5–5.1)
Sodium: 141 mmol/L (ref 135–145)

## 2018-01-02 LAB — URINALYSIS, ROUTINE W REFLEX MICROSCOPIC
BILIRUBIN URINE: NEGATIVE
Glucose, UA: NEGATIVE mg/dL
HGB URINE DIPSTICK: NEGATIVE
Ketones, ur: NEGATIVE mg/dL
Leukocytes, UA: NEGATIVE
NITRITE: NEGATIVE
Protein, ur: NEGATIVE mg/dL
SPECIFIC GRAVITY, URINE: 1.025 (ref 1.005–1.030)
pH: 5 (ref 5.0–8.0)

## 2018-01-02 LAB — CBG MONITORING, ED: GLUCOSE-CAPILLARY: 107 mg/dL — AB (ref 70–99)

## 2018-01-02 LAB — SALICYLATE LEVEL

## 2018-01-02 NOTE — ED Triage Notes (Addendum)
Pt from home with c/o weakness in left extremities. Pt reports tingling with palpation but no other deficits are noted. Pt is able to ambulate with no alteration in gait and is able to use left arm. Pt states he was seen on Monday for same symptoms and all acute conditions were ruled out. Pt states symptoms began Friday

## 2018-01-03 NOTE — Progress Notes (Deleted)
Subjective:    Patient ID: Curtis Townsend, male    DOB: 19-Jan-1976, 42 y.o.   MRN: [id number]  HPI The patient is here for an acute visit.   He was in the Ed two days ago for weakness and aphasia.  He has history of alcoholism, bipolar, depression and IBS.  He has a tingling sensation in his upper extremities and left leg.  He has subjective weakness in his left arm and leg.  He feels like he favors his left side when he walks.  He states feeling dizzy and foggy.  This is worse with position changes.  His neurological exam was nonfocal in the ED.  His blood work was stable.  His Ct of the head was normal.  He was ambulatory in the ED with a steady gait.  His symptoms wer not thought to be related to a TIA.  Neurology consult was recommended as an outpatient.   He wsa discharged home.     Medications and allergies reviewed with patient and updated if appropriate.  Patient Active Problem List   Diagnosis Date Noted  . Foreign body (FB) in soft tissue 11/03/2017  . Pure hypercholesterolemia 03/14/2017  . Pure hyperglyceridemia 03/14/2017  . Family history of colon cancer in father 03/14/2017  . B12 deficiency 06/28/2016  . Vitamin B12 deficiency neuropathy ([place]) 06/08/2016  . Routine general medical examination at a health care facility 01/27/2015  . Chronic shoulder pain 04/22/2014  . DOE (dyspnea on exertion) 02/12/2014  . Bipolar I disorder ([place]) 12/29/2013  . Neurosis, posttraumatic 12/29/2013  . Bipolar affective disorder, depressed ([place]) 08/04/2013    Current Outpatient Medications on File Prior to Visit  Medication Sig Dispense Refill  . aspirin 81 MG chewable tablet Chew 81 mg by mouth as needed for mild pain.    . divalproex (DEPAKOTE ER) 500 MG 24 hr tablet Take 1 tablet (500 mg total) by mouth daily. 30 tablet 0  . FLUoxetine (PROZAC) 40 MG capsule Take 1 capsule (40 mg total) by mouth daily. 90 capsule 0  . ibuprofen (ADVIL,MOTRIN) 200 MG tablet Take 200 mg by mouth  daily as needed (pain).    Marland Kitchen ketorolac (TORADOL) 10 MG tablet Take 1 tablet (10 mg total) by mouth every 6 (six) hours as needed. (Patient not taking: Reported on 01/02/2018) 10 tablet 0  . methocarbamol (ROBAXIN) 750 MG tablet Take 1-2 tablets (750-1,500 mg total) by mouth 3 (three) times daily as needed for muscle spasms. (Patient not taking: Reported on 01/02/2018) 18 tablet 0   No current facility-administered medications on file prior to visit.     Past Medical History:  Diagnosis Date  . Alcoholism ([place])   . Anxiety   . Bipolar 1 disorder ([place])   . Depression   . Depression   . Diabetes ([place])   . History of chicken pox   . History of kidney stones   . Hyperlipemia    no current med.  . IBS (irritable bowel syndrome)   . Lipoma of back 05/2012   right  . Seasonal allergies   . Wears partial dentures    upper    Past Surgical History:  Procedure Laterality Date  . LIPOMA EXCISION  06/10/2012   Procedure: EXCISION LIPOMA;  Surgeon: Ralene Ok, MD;  Location: [facility];  Service: General;  Laterality: Right;  excision of back lipoma on right 3x3cm    Social History   Socioeconomic History  . Marital status: Married  Spouse name: Not on file  . Number of children: 1  . Years of education: 33  . Highest education level: Not on file  Occupational History  . Occupation: Radiographer, therapeutic: [employer]  . Financial resource strain: Not on file  . Food insecurity:    Worry: Not on file    Inability: Not on file  . Transportation needs:    Medical: Not on file    Non-medical: Not on file  Tobacco Use  . Smoking status: Current Some Day Smoker    Packs/day: 0.10    Years: 16.00    Pack years: 1.60    Types: Cigarettes  . Smokeless tobacco: Never Used  . Tobacco comment: 1-2 cig./month  Substance and Sexual Activity  . Alcohol use: Yes    Alcohol/week: 0.0 oz    Comment: Occasional use  . Drug use: No     Comment: states daily THC  . Sexual activity: Yes    Partners: Female    Birth control/protection: None  Lifestyle  . Physical activity:    Days per week: Not on file    Minutes per session: Not on file  . Stress: Not on file  Relationships  . Social connections:    Talks on phone: Not on file    Gets together: Not on file    Attends religious service: Not on file    Active member of club or organization: Not on file    Attends meetings of clubs or organizations: Not on file    Relationship status: Not on file  Other Topics Concern  . Not on file  Social History Narrative  . Not on file    Family History  Problem Relation Age of Onset  . Stroke Mother   . Hypertension Mother   . Diabetes Mother   . Hypertension Father   . Diabetes Father   . Cancer Paternal Uncle        Prostate Cancer    Review of Systems     Objective:  There were no vitals filed for this visit. BP Readings from Last 3 Encounters:  01/03/18 104/74  12/31/17 124/83  11/03/17 111/62   Wt Readings from Last 3 Encounters:  12/31/17 193 lb (87.5 kg)  11/03/17 210 lb (95.3 kg)  09/03/17 209 lb (94.8 kg)   There is no height or weight on file to calculate BMI.   Physical Exam       CT Head Wo Contrast CLINICAL DATA:  Dizziness and left-sided weakness for 1 week.  EXAM: CT HEAD WITHOUT CONTRAST  TECHNIQUE: Contiguous axial images were obtained from the base of the skull through the vertex without intravenous contrast.  COMPARISON:  Head CT 06/28/2012  FINDINGS: Brain: There is no mass, hemorrhage or extra-axial collection. The size and configuration of the ventricles and extra-axial CSF spaces are normal. There is no acute or chronic infarction. The brain parenchyma is normal.  Vascular: No abnormal hyperdensity of the major intracranial arteries or dural venous sinuses. No intracranial atherosclerosis.  Skull: The visualized skull base, calvarium and extracranial soft tissues  are normal.  Sinuses/Orbits: No fluid levels or advanced mucosal thickening of the visualized paranasal sinuses. No mastoid or middle ear effusion. The orbits are normal.  IMPRESSION: Normal head CT.  Electronically Signed   By: Ulyses Jarred M.D.   On: 01/02/2018 23:01   Assessment & Plan:    See Problem List for Assessment and Plan of  chronic medical problems.

## 2018-01-03 NOTE — ED Provider Notes (Signed)
[facility] DEPT Provider Note   CSN: [id number] Arrival date & time: 01/02/18  1717    History   Chief Complaint Chief Complaint  Patient presents with  . Weakness  . Aphasia    HPI Curtis Townsend is a 42 y.o. male.  42 year old male with a history of alcoholism, bipolar 1 disorder, depression, IBS presents to the ED for multiple complaints.  He reports onset of chest pain 5 days ago which persisted through Monday.  Patient was evaluated in the ED at this time with a reassuring cardiac work-up.  He states that his chest pain is largely subsided, but he has continued to have paresthesias in his extremities.  He reports a tingling sensation in his bilateral upper extremities as well as his left leg.  He has subjective weakness in his left arm and left leg as well.  He feels like when he walks he is "favoring the left" side.  The patient has further been experiencing dizziness.  He characterizes this dizziness as "feeling foggy".  This is aggravated by position changes.  He denies any medication changes or recent fever.  No bowel or bladder incontinence, complete vision loss, inability to walk.  No back pain.  The patient has not followed up with his primary care doctor since last ED visit.  No history of neurology follow-up.     Past Medical History:  Diagnosis Date  . Alcoholism ([place])   . Anxiety   . Bipolar 1 disorder ([place])   . Depression   . Depression   . Diabetes ([place])   . History of chicken pox   . History of kidney stones   . Hyperlipemia    no current med.  . IBS (irritable bowel syndrome)   . Lipoma of back 05/2012   right  . Seasonal allergies   . Wears partial dentures    upper    Patient Active Problem List   Diagnosis Date Noted  . Foreign body (FB) in soft tissue 11/03/2017  . Pure hypercholesterolemia 03/14/2017  . Pure hyperglyceridemia 03/14/2017  . Family history of colon cancer in father 03/14/2017  . B12 deficiency  06/28/2016  . Vitamin B12 deficiency neuropathy ([place]) 06/08/2016  . Routine general medical examination at a health care facility 01/27/2015  . Chronic shoulder pain 04/22/2014  . DOE (dyspnea on exertion) 02/12/2014  . Bipolar I disorder ([place]) 12/29/2013  . Neurosis, posttraumatic 12/29/2013  . Bipolar affective disorder, depressed ([place]) 08/04/2013    Past Surgical History:  Procedure Laterality Date  . LIPOMA EXCISION  06/10/2012   Procedure: EXCISION LIPOMA;  Surgeon: Ralene Ok, MD;  Location: [facility];  Service: General;  Laterality: Right;  excision of back lipoma on right 3x3cm        Home Medications    Prior to Admission medications   Medication Sig Start Date End Date Taking? Authorizing Provider  aspirin 81 MG chewable tablet Chew 81 mg by mouth as needed for mild pain.   Yes [provider]  divalproex (DEPAKOTE ER) 500 MG 24 hr tablet Take 1 tablet (500 mg total) by mouth daily. 11/22/17 11/22/18 Yes Arfeen, Arlyce Harman, MD  FLUoxetine (PROZAC) 40 MG capsule Take 1 capsule (40 mg total) by mouth daily. 11/22/17  Yes Arfeen, Arlyce Harman, MD  ibuprofen (ADVIL,MOTRIN) 200 MG tablet Take 200 mg by mouth daily as needed (pain).   Yes [provider]  ketorolac (TORADOL) 10 MG tablet Take 1 tablet (10 mg total)  by mouth every 6 (six) hours as needed. Patient not taking: Reported on 01/02/2018 12/31/17   Isla Pence, MD  methocarbamol (ROBAXIN) 750 MG tablet Take 1-2 tablets (750-1,500 mg total) by mouth 3 (three) times daily as needed for muscle spasms. Patient not taking: Reported on 01/02/2018 11/03/17   Lorin Glass, PA-C    Family History Family History  Problem Relation Age of Onset  . Stroke Mother   . Hypertension Mother   . Diabetes Mother   . Hypertension Father   . Diabetes Father   . Cancer Paternal Uncle        Prostate Cancer    Social History Social History   Tobacco Use  . Smoking status: Current Some Day Smoker      Packs/day: 0.10    Years: 16.00    Pack years: 1.60    Types: Cigarettes  . Smokeless tobacco: Never Used  . Tobacco comment: 1-2 cig./month  Substance Use Topics  . Alcohol use: Yes    Alcohol/week: 0.0 oz    Comment: Occasional use  . Drug use: No    Comment: states daily THC     Allergies   Patient has no known allergies.   Review of Systems Review of Systems Ten systems reviewed and are negative for acute change, except as noted in the HPI.    Physical Exam Updated Vital Signs BP 104/74   Pulse 61   Temp 98.7 F (37.1 C) (Oral)   Resp 11   SpO2 98%   Physical Exam  Constitutional: He is oriented to person, place, and time. He appears well-developed and well-nourished. No distress.  Nontoxic appearing and in NAD  HENT:  Head: Normocephalic and atraumatic.  Mouth/Throat: Oropharynx is clear and moist.  Eyes: Conjunctivae and EOM are normal. No scleral icterus.  Neck: Normal range of motion.  No meningismus  Cardiovascular: Normal rate, regular rhythm and intact distal pulses.  Pulmonary/Chest: Effort normal. No stridor. No respiratory distress. He has no wheezes.  Respirations even and unlabored. Lungs CTAB.  Musculoskeletal: Normal range of motion.  Neurological: He is alert and oriented to person, place, and time. No cranial nerve deficit. He exhibits normal muscle tone. Coordination normal.  GCS 15. Speech is goal oriented. No cranial nerve deficits appreciated; symmetric eyebrow raise, no facial drooping, tongue midline. Patient has equal grip strength bilaterally. He has 4+/5 strength in his LLE against resistance (favored to be 2/2 poor effort) with 5/5 strength against resistance in all remaining major muscle groups bilaterally. Sensation to light touch intact. Patient moves extremities without ataxia. Patient ambulatory with steady gait.  Skin: Skin is warm and dry. No rash noted. He is not diaphoretic. No erythema. No pallor.  Psychiatric: He has a  normal mood and affect. His behavior is normal.  Nursing note and vitals reviewed.    ED Treatments / Results  Labs (all labs ordered are listed, but only abnormal results are displayed) Labs Reviewed  BASIC METABOLIC PANEL - Abnormal; Notable for the following components:      Result Value   Glucose, Bld 144 (*)    All other components within normal limits  CBG MONITORING, ED - Abnormal; Notable for the following components:   Glucose-Capillary 107 (*)    All other components within normal limits  CBC  URINALYSIS, ROUTINE W REFLEX MICROSCOPIC  SALICYLATE LEVEL    EKG None  Radiology Ct Head Wo Contrast  Result Date: 01/02/2018 CLINICAL DATA:  Dizziness and left-sided weakness for 1  week. EXAM: CT HEAD WITHOUT CONTRAST TECHNIQUE: Contiguous axial images were obtained from the base of the skull through the vertex without intravenous contrast. COMPARISON:  Head CT 06/28/2012 FINDINGS: Brain: There is no mass, hemorrhage or extra-axial collection. The size and configuration of the ventricles and extra-axial CSF spaces are normal. There is no acute or chronic infarction. The brain parenchyma is normal. Vascular: No abnormal hyperdensity of the major intracranial arteries or dural venous sinuses. No intracranial atherosclerosis. Skull: The visualized skull base, calvarium and extracranial soft tissues are normal. Sinuses/Orbits: No fluid levels or advanced mucosal thickening of the visualized paranasal sinuses. No mastoid or middle ear effusion. The orbits are normal. IMPRESSION: Normal head CT. Electronically Signed   By: Ulyses Jarred M.D.   On: 01/02/2018 23:01    Procedures Procedures (including critical care time)  Medications Ordered in ED Medications - No data to display   Initial Impression / Assessment and Plan / ED Course  I have reviewed the triage vital signs and the nursing notes.  Pertinent labs & imaging results that were available during my care of the patient were  reviewed by me and considered in my medical decision making (see chart for details).     42 year old male presents to the emergency department for multiple complaints.  He is mostly fixated on paresthesias in his upper extremities and left leg.  This has been associated with some subjective weakness in the left lower extremity.  These paresthesias with weakness have been persistent over the past 3 days.  Patient has a nonfocal neurologic exam.  He is afebrile with stable vital signs.  Laboratory work-up is reassuring and stable compared with evaluation 2 days ago.  The patient did undergo a head CT given paresthesias, but this shows no evidence of stroke, hemorrhage, hydrocephalus, mass/lesion.  The patient has been ambulatory while in the ED with stable, steady gait.  I do not see indication for further emergent work-up at this time.  His symptoms are inconsistent with TIA and patient with low risk ABCD2 score.  He is documented to have a history of diabetes as well as dyslipidemia; however, patient states that he actually has a history of hypoglycemia and he is not prescribed to take a statin.  I do believe the patient would benefit from further outpatient neurology follow-up.  Will provide referral for further evaluation of symptoms.  Patient very agitated and frustrated with our inability to provide an answer for his symptoms.  I have tried to provide reassurance to the patient that it does not appear that an emergent process is responsible for his paresthesias today.  Have also continue to encourage primary care follow-up.  Return precautions discussed and provided. Patient discharged in stable condition with no unaddressed concerns.   Final Clinical Impressions(s) / ED Diagnoses   Final diagnoses:  Paresthesias    ED Discharge Orders    None       Antonietta Breach, PA-C 01/03/18 [id number]    Tegeler, Gwenyth Allegra, MD 01/03/18 1046

## 2018-01-03 NOTE — ED Notes (Signed)
Pt left and went to car leaving his wife in the room to get his discharge papers. Explained to wife that the pt needs to be present so nurse can review discharge paperwork/instructions and the pt would need to sign himself out as well. Wife attempted to call pt to come back into building for discharge instructions and pt did not answer. She went out to get pt and did not return. Pt discharge papers remain at nurses station.

## 2018-01-03 NOTE — Discharge Instructions (Signed)
Your work-up in the emergency department was reassuring.  Continue your daily medications.  We recommend that you follow-up with a neurologist for further evaluation of your symptoms.  Also follow-up with your primary care doctor regarding your ED visits.  You may return for new or concerning symptoms.

## 2018-01-04 ENCOUNTER — Encounter: Payer: Self-pay | Admitting: Internal Medicine

## 2018-01-04 ENCOUNTER — Ambulatory Visit (INDEPENDENT_AMBULATORY_CARE_PROVIDER_SITE_OTHER): Payer: Federal, State, Local not specified - PPO | Admitting: Internal Medicine

## 2018-01-04 ENCOUNTER — Ambulatory Visit: Payer: Self-pay | Admitting: Internal Medicine

## 2018-01-04 VITALS — BP 102/70 | HR 99 | Temp 98.1°F | Resp 16 | Wt 187.0 lb

## 2018-01-04 DIAGNOSIS — R202 Paresthesia of skin: Secondary | ICD-10-CM | POA: Diagnosis not present

## 2018-01-04 DIAGNOSIS — R42 Dizziness and giddiness: Secondary | ICD-10-CM | POA: Diagnosis not present

## 2018-01-04 NOTE — Progress Notes (Signed)
Subjective:    Patient ID: Curtis Townsend, male    DOB: 08/06/1975, 42 y.o.   MRN: [id number]  HPI The patient is here for an acute visit.   He was in the ED two days ago for weakness and aphasia.  He has history of alcoholism, bipolar, depression and IBS.  He has having a tingling sensation in his upper extremities and left leg.  He has subjective weakness in his left arm and leg.  He feels like he favors his left side when he walks.  He states feeling dizzy and foggy.  This is worse with position changes.  His neurological exam was nonfocal in the ED.  His blood work was stable.  His Ct of the head was normal.  He was ambulatory in the ED with a steady gait.  His symptoms were not thought to be related to a TIA.  Neurology consult was recommended as an outpatient.   He was discharged home.    He is still having dizziness, fatigue and he feels drained.  If he walks he feels like he leans to the left and feels unbalanced.  When asked initially he stated everything started Friday night. He was out and had a drink.  It was one drink and then his heart started racing and he started sweating.  He had a hard time walking to his car.  He drove home. Since then he felt weak.    He has been stressed.  He feels both emotionally and physically drained.  He is not taking his psych meds because they make him feel too doped up.  He is following with psychiatry and a therapist.     He does not sleep well.  He has a lot of pressure on him from everyone.  He is on disability because of his bipolar.  He takes care of his 3 kids and his mom who lives with him.  He takes care of his house and yard.  His wife works.  He does not feel that anyone understands everything that he does and he never gets a break.  His neighbors and his wife's family do not think he is a good person and have spread rumors about him and thinks he has not done, such as selling drugs.  He repeated multiple times that he just feels tired  and drained.  He had an extremely difficult childhood and has had a lot of losses in his life.  He sometimes drinks alcohol to escape.  Medications and allergies reviewed with patient and updated if appropriate.  Patient Active Problem List   Diagnosis Date Noted  . Foreign body (FB) in soft tissue 11/03/2017  . Pure hypercholesterolemia 03/14/2017  . Pure hyperglyceridemia 03/14/2017  . Family history of colon cancer in father 03/14/2017  . B12 deficiency 06/28/2016  . Vitamin B12 deficiency neuropathy ([place]) 06/08/2016  . Routine general medical examination at a health care facility 01/27/2015  . Chronic shoulder pain 04/22/2014  . DOE (dyspnea on exertion) 02/12/2014  . Bipolar I disorder ([place]) 12/29/2013  . Neurosis, posttraumatic 12/29/2013  . Bipolar affective disorder, depressed ([place]) 08/04/2013    Current Outpatient Medications on File Prior to Visit  Medication Sig Dispense Refill  . aspirin 81 MG chewable tablet Chew 81 mg by mouth as needed for mild pain.    . divalproex (DEPAKOTE ER) 500 MG 24 hr tablet Take 1 tablet (500 mg total) by mouth daily. 30 tablet 0  . FLUoxetine (PROZAC) 40  MG capsule Take 1 capsule (40 mg total) by mouth daily. 90 capsule 0  . ibuprofen (ADVIL,MOTRIN) 200 MG tablet Take 200 mg by mouth daily as needed (pain).    Marland Kitchen ketorolac (TORADOL) 10 MG tablet Take 1 tablet (10 mg total) by mouth every 6 (six) hours as needed. 10 tablet 0  . methocarbamol (ROBAXIN) 750 MG tablet Take 1-2 tablets (750-1,500 mg total) by mouth 3 (three) times daily as needed for muscle spasms. 18 tablet 0   No current facility-administered medications on file prior to visit.     Past Medical History:  Diagnosis Date  . Alcoholism ([place])   . Anxiety   . Bipolar 1 disorder ([place])   . Depression   . Depression   . Diabetes ([place])   . History of chicken pox   . History of kidney stones   . Hyperlipemia    no current med.  . IBS (irritable bowel syndrome)   . Lipoma of  back 05/2012   right  . Seasonal allergies   . Wears partial dentures    upper    Past Surgical History:  Procedure Laterality Date  . LIPOMA EXCISION  06/10/2012   Procedure: EXCISION LIPOMA;  Surgeon: Ralene Ok, MD;  Location: [facility];  Service: General;  Laterality: Right;  excision of back lipoma on right 3x3cm    Social History   Socioeconomic History  . Marital status: Married    Spouse name: Not on file  . Number of children: 1  . Years of education: 9  . Highest education level: Not on file  Occupational History  . Occupation: Radiographer, therapeutic: [employer]  . Financial resource strain: Not on file  . Food insecurity:    Worry: Not on file    Inability: Not on file  . Transportation needs:    Medical: Not on file    Non-medical: Not on file  Tobacco Use  . Smoking status: Current Some Day Smoker    Packs/day: 0.10    Years: 16.00    Pack years: 1.60    Types: Cigarettes  . Smokeless tobacco: Never Used  . Tobacco comment: 1-2 cig./month  Substance and Sexual Activity  . Alcohol use: Yes    Alcohol/week: 0.0 oz    Comment: Occasional use  . Drug use: No    Comment: states daily THC  . Sexual activity: Yes    Partners: Female    Birth control/protection: None  Lifestyle  . Physical activity:    Days per week: Not on file    Minutes per session: Not on file  . Stress: Not on file  Relationships  . Social connections:    Talks on phone: Not on file    Gets together: Not on file    Attends religious service: Not on file    Active member of club or organization: Not on file    Attends meetings of clubs or organizations: Not on file    Relationship status: Not on file  Other Topics Concern  . Not on file  Social History Narrative  . Not on file    Family History  Problem Relation Age of Onset  . Stroke Mother   . Hypertension Mother   . Diabetes Mother   . Hypertension Father   . Diabetes  Father   . Cancer Paternal Uncle        Prostate Cancer    Review of Systems  Constitutional: Positive for fatigue. Negative for fever.  Respiratory: Negative for cough, shortness of breath and wheezing.   Cardiovascular: Positive for chest pain (None currently-was evaluated in the ED for this) and palpitations.  Gastrointestinal: Negative for abdominal pain.  Neurological: Positive for dizziness and headaches (Sometimes). Negative for weakness and numbness ([place]).  Psychiatric/Behavioral: Positive for dysphoric mood and sleep disturbance. The patient is nervous/anxious.        Objective:   Vitals:   01/04/18 1123  BP: 102/70  Pulse: 99  Resp: 16  Temp: 98.1 F (36.7 C)  SpO2: 99%   BP Readings from Last 3 Encounters:  01/04/18 102/70  01/03/18 104/74  12/31/17 124/83   Wt Readings from Last 3 Encounters:  01/04/18 187 lb (84.8 kg)  12/31/17 193 lb (87.5 kg)  11/03/17 210 lb (95.3 kg)   Body mass index is 24.67 kg/m.   Physical Exam  Constitutional: He is oriented to person, place, and time. He appears well-developed and well-nourished. No distress.  HENT:  Head: Normocephalic and atraumatic.  Cardiovascular: Normal rate and regular rhythm.  No murmur heard. Pulmonary/Chest: Effort normal. No respiratory distress. He has no wheezes. He has no rales.  Musculoskeletal: He exhibits no edema.  Neurological: He is alert and oriented to person, place, and time. No sensory deficit. He exhibits normal muscle tone. Coordination normal.  Skin: Skin is warm and dry. He is not diaphoretic.         CT Head Wo Contrast CLINICAL DATA:  Dizziness and left-sided weakness for 1 week.  EXAM: CT HEAD WITHOUT CONTRAST  TECHNIQUE: Contiguous axial images were obtained from the base of the skull through the vertex without intravenous contrast.  COMPARISON:  Head CT 06/28/2012  FINDINGS: Brain: There is no mass, hemorrhage or extra-axial collection. The size and  configuration of the ventricles and extra-axial CSF spaces are normal. There is no acute or chronic infarction. The brain parenchyma is normal.  Vascular: No abnormal hyperdensity of the major intracranial arteries or dural venous sinuses. No intracranial atherosclerosis.  Skull: The visualized skull base, calvarium and extracranial soft tissues are normal.  Sinuses/Orbits: No fluid levels or advanced mucosal thickening of the visualized paranasal sinuses. No mastoid or middle ear effusion. The orbits are normal.  IMPRESSION: Normal head CT.  Electronically Signed   By: Ulyses Jarred M.D.   On: 01/02/2018 23:01   Assessment & Plan:   Over the past month he has experienced atypical chest pain, paresthesias, subjective weakness.  Work-up in the emergency room for these complaints has been negative.  His visit today was very lengthy and it was primarily spent discussing his stresses at home and the stresses from his childhood.  He is following with psychiatry and a therapist.  He is experiencing significant fatigue and feeling drained, which is both emotional and physical.  This could be causing many of his symptoms.  Further neurological evaluation is necessary-I will refer to neurology.  He is not taking his psychiatric meds because they make him feel too drowsy.  I recommended close follow up with his therapist and psychiatrist.     Recommended follow up with his pcp to avoid going to the ED.

## 2018-01-05 DIAGNOSIS — R42 Dizziness and giddiness: Secondary | ICD-10-CM | POA: Insufficient documentation

## 2018-01-05 DIAGNOSIS — R202 Paresthesia of skin: Secondary | ICD-10-CM | POA: Insufficient documentation

## 2018-01-05 NOTE — Patient Instructions (Signed)
A referral was ordered for neurology.    Follow up with your therapist, psychiatrist and pcp.

## 2018-01-07 ENCOUNTER — Ambulatory Visit (HOSPITAL_COMMUNITY): Payer: Medicare Other | Admitting: Licensed Clinical Social Worker

## 2018-01-20 ENCOUNTER — Other Ambulatory Visit (HOSPITAL_COMMUNITY): Payer: Self-pay | Admitting: Psychiatry

## 2018-01-20 DIAGNOSIS — F3163 Bipolar disorder, current episode mixed, severe, without psychotic features: Secondary | ICD-10-CM

## 2018-01-21 ENCOUNTER — Ambulatory Visit (HOSPITAL_COMMUNITY): Payer: Self-pay | Admitting: Licensed Clinical Social Worker

## 2018-01-22 ENCOUNTER — Ambulatory Visit: Payer: Self-pay | Admitting: Cardiology

## 2018-01-23 ENCOUNTER — Encounter: Payer: Self-pay | Admitting: Neurology

## 2018-01-30 ENCOUNTER — Other Ambulatory Visit (HOSPITAL_COMMUNITY): Payer: Self-pay

## 2018-01-30 DIAGNOSIS — F3163 Bipolar disorder, current episode mixed, severe, without psychotic features: Secondary | ICD-10-CM

## 2018-01-30 MED ORDER — DIVALPROEX SODIUM ER 500 MG PO TB24: 500.0000 mg | ORAL_TABLET | Freq: Every day | ORAL | 0 refills | Status: DC

## 2018-02-04 ENCOUNTER — Ambulatory Visit (INDEPENDENT_AMBULATORY_CARE_PROVIDER_SITE_OTHER): Payer: Federal, State, Local not specified - PPO | Admitting: Licensed Clinical Social Worker

## 2018-02-04 ENCOUNTER — Encounter (HOSPITAL_COMMUNITY): Payer: Self-pay | Admitting: Licensed Clinical Social Worker

## 2018-02-04 DIAGNOSIS — F3163 Bipolar disorder, current episode mixed, severe, without psychotic features: Secondary | ICD-10-CM

## 2018-02-04 DIAGNOSIS — F4310 Post-traumatic stress disorder, unspecified: Secondary | ICD-10-CM

## 2018-02-04 NOTE — Progress Notes (Signed)
   THERAPIST PROGRESS NOTE  Session Time: 12:30pm-1:30pm  Participation Level: Active  Behavioral Response: Well GroomedAlertEuthymic  Type of Therapy: Individual Therapy  Treatment Goals addressed: Improve psychiatric symptoms, elevate mood (decreased irritability, increased enjoyment of activities), Improve unhelpful thought patterns, emotional regulation skills (reduce temper outburst), Interpersonal relationship skills  Interventions: Motivational Interviewing and Other: Grounding and Mindfulness techniques  Summary: Curtis Townsend is a 42 y.o. male who presents with PTSD and Bipolar I Disorder, mixed, severe  Suicidal/Homicidal: No without intent/plan  Therapist Response:  Nels met with clinician for individual therapy. Daishaun discussed his psychiatric symptoms and current life events. Jeray shared that he had a "heart attack", which has changed his entire outlook on life. He shared the story of the incident and reported that although it was not a full-fledged heart attack, it was enough of a scare to change his perspective. Clinician utilized MI OARS to reflect and summarize thoughts and feelings. Clinician noted the new found freedom from comparison with others, as well as from the expectations of others. Clinician processed how this has changed his interactions with wife, children, and mother. Clinician also encouraged Elver to continue this positive path of self-discovery and self-care.   Plan: Return again in 2-3 weeks.  Diagnosis:     Axis I: PTSD and Bipolar I Disorder, mixed, severe  [name], LCSW 02/04/2018

## 2018-02-06 ENCOUNTER — Encounter: Payer: Self-pay | Admitting: Cardiovascular Disease

## 2018-02-06 ENCOUNTER — Encounter: Payer: Self-pay | Admitting: *Deleted

## 2018-02-06 ENCOUNTER — Ambulatory Visit (INDEPENDENT_AMBULATORY_CARE_PROVIDER_SITE_OTHER): Payer: Federal, State, Local not specified - PPO | Admitting: Cardiovascular Disease

## 2018-02-06 DIAGNOSIS — R002 Palpitations: Secondary | ICD-10-CM | POA: Diagnosis not present

## 2018-02-06 DIAGNOSIS — R0789 Other chest pain: Secondary | ICD-10-CM

## 2018-02-06 NOTE — Patient Instructions (Signed)
Medication Instructions:  Your physician recommends that you continue on your current medications as directed. Please refer to the Current Medication list given to you today.   Labwork: NONE  Testing/Procedures: Your physician has requested that you have an exercise tolerance test. For further information please visit [email or web address]. Please also follow instruction sheet, as given.  Your physician has recommended that you wear an 30 DAY event monitor. Event monitors are medical devices that record the heart's electrical activity. Doctors most often [name] these monitors to diagnose arrhythmias. Arrhythmias are problems with the speed or rhythm of the heartbeat. The monitor is a small, portable device. You can wear one while you do your normal daily activities. This is usually used to diagnose what is causing palpitations/syncope (passing out).    Follow-Up: We request that you follow-up in: 6 MONTHS with an extender and in 12 MONTHS with Dr Andria Rhein will receive a reminder letter in the mail two months in advance. If you don't receive a letter, please call our office to schedule the follow-up appointment.    Any Other Special Instructions Will Be Listed Below (If Applicable).     If you need a refill on your cardiac medications before your next appointment, please call your pharmacy.

## 2018-02-06 NOTE — Progress Notes (Signed)
02/06/2018 Curtis Townsend Townsend   [birth year]  [id number]  Primary Physician Curtis Townsend Lima, MD Primary Cardiologist: Curtis Townsend Harp MD Curtis Townsend Townsend, [place]  HPI:  Curtis Townsend Townsend is a 42 y.o. thin appearing married African-American male father of 24 young children who is currently disabled because of bipolar disorder and PTSD.  He stays at home with his children and apparently his wife Curtis Townsend Townsend works.  He is referred by the ER for atypical chest pain and palpitations.  His primary care provider is Dr. Scarlette Townsend.  He has no cardiac risk factors.  Never had a heart attack or stroke.  He is noticed chest pain with exertion as well as palpitations over the last year.  He does smoke marijuana but does not use other illicit drugs.  He drinks alcohol on occasion.   Current Meds  Medication Sig  . aspirin 81 MG chewable tablet Chew 81 mg by mouth as needed for mild pain.  . divalproex (DEPAKOTE ER) 500 MG 24 hr tablet Take 1 tablet (500 mg total) by mouth daily.  Marland Kitchen FLUoxetine (PROZAC) 40 MG capsule Take 1 capsule (40 mg total) by mouth daily.  Marland Kitchen ibuprofen (ADVIL,MOTRIN) 200 MG tablet Take 200 mg by mouth daily as needed (pain).  Marland Kitchen ketorolac (TORADOL) 10 MG tablet Take 1 tablet (10 mg total) by mouth every 6 (six) hours as needed.  . methocarbamol (ROBAXIN) 750 MG tablet Take 1-2 tablets (750-1,500 mg total) by mouth 3 (three) times daily as needed for muscle spasms.     No Known Allergies  Social History   Socioeconomic History  . Marital status: Married    Spouse name: Not on file  . Number of children: 1  . Years of education: 76  . Highest education level: Not on file  Occupational History  . Occupation: Radiographer, therapeutic: Curtis Townsend Townsend  . Financial resource strain: Not on file  . Food insecurity:    Worry: Not on file    Inability: Not on file  . Transportation needs:    Medical: Not on file    Non-medical: Not on file  Tobacco Use  .  Smoking status: Current Some Day Smoker    Packs/day: 0.10    Years: 16.00    Pack years: 1.60    Types: Cigarettes  . Smokeless tobacco: Never Used  . Tobacco comment: 1-2 cig./month  Substance and Sexual Activity  . Alcohol use: Yes    Alcohol/week: 0.0 oz    Comment: Occasional use  . Drug use: No    Comment: states daily THC  . Sexual activity: Yes    Partners: Female    Birth control/protection: None  Lifestyle  . Physical activity:    Days per week: Not on file    Minutes per session: Not on file  . Stress: Not on file  Relationships  . Social connections:    Talks on phone: Not on file    Gets together: Not on file    Attends religious service: Not on file    Active member of club or organization: Not on file    Attends meetings of clubs or organizations: Not on file    Relationship status: Not on file  . Intimate partner violence:    Fear of current or ex partner: Not on file    Emotionally abused: Not on file    Physically abused: Not on file    Forced sexual  activity: Not on file  Other Topics Concern  . Not on file  Social History Narrative  . Not on file     Review of Systems: General: negative for chills, fever, night sweats or weight changes.  Cardiovascular: negative for chest pain, dyspnea on exertion, edema, orthopnea, palpitations, paroxysmal nocturnal dyspnea or shortness of breath Dermatological: negative for rash Respiratory: negative for cough or wheezing Urologic: negative for hematuria Abdominal: negative for nausea, vomiting, diarrhea, bright red blood per rectum, melena, or hematemesis Neurologic: negative for visual changes, syncope, or dizziness All other systems reviewed and are otherwise negative except as noted above.    Blood pressure 133/76, pulse 71, height 6\' 1"  (1.854 m), weight 189 lb 12.8 oz (86.1 kg).  General appearance: alert and no distress Neck: no adenopathy, no carotid bruit, no JVD, supple, symmetrical, trachea  midline and thyroid not enlarged, symmetric, no tenderness/mass/nodules Lungs: clear to auscultation bilaterally Heart: regular rate and rhythm, S1, S2 Curtis Townsend, no murmur, click, rub or gallop Extremities: extremities Curtis Townsend, atraumatic, no cyanosis or edema Pulses: 2+ and symmetric Skin: Skin color, texture, turgor Curtis Townsend. No rashes or lesions Neurologic: Alert and oriented X 3, Curtis Townsend strength and tone. Curtis Townsend symmetric reflexes. Curtis Townsend coordination and gait  EKG not performed today  ASSESSMENT AND PLAN:   Atypical chest pain Curtis Townsend Townsend complains of atypical chest pain mostly with exertion over the last year.  He has no cardiac risk factors.  I am going to get a routine GXT to further evaluate.  Palpitations History of palpitations that occur randomly associated with some chest discomfort.  We will obtain a 30-day event monitor to further evaluate.      Curtis Townsend Harp MD FACP,FACC,FAHA, [facility] 02/06/2018 12:09 PM

## 2018-02-06 NOTE — Assessment & Plan Note (Signed)
Mr. Erman complains of atypical chest pain mostly with exertion over the last year.  He has no cardiac risk factors.  I am going to get a routine GXT to further evaluate.

## 2018-02-06 NOTE — Assessment & Plan Note (Signed)
History of palpitations that occur randomly associated with some chest discomfort.  We will obtain a 30-day event monitor to further evaluate.

## 2018-02-07 ENCOUNTER — Telehealth (HOSPITAL_COMMUNITY): Payer: Self-pay

## 2018-02-07 ENCOUNTER — Ambulatory Visit (INDEPENDENT_AMBULATORY_CARE_PROVIDER_SITE_OTHER): Payer: Federal, State, Local not specified - PPO

## 2018-02-07 DIAGNOSIS — R0789 Other chest pain: Secondary | ICD-10-CM | POA: Diagnosis not present

## 2018-02-07 DIAGNOSIS — R002 Palpitations: Secondary | ICD-10-CM

## 2018-02-07 NOTE — Telephone Encounter (Signed)
Encounter complete. 

## 2018-02-08 ENCOUNTER — Telehealth (HOSPITAL_COMMUNITY): Payer: Self-pay

## 2018-02-08 NOTE — Telephone Encounter (Signed)
Encounter complete. 

## 2018-02-11 ENCOUNTER — Ambulatory Visit (HOSPITAL_COMMUNITY): Payer: Self-pay | Admitting: Psychiatry

## 2018-02-12 ENCOUNTER — Ambulatory Visit (HOSPITAL_COMMUNITY)
Admission: RE | Admit: 2018-02-12 | Discharge: 2018-02-12 | Disposition: A | Discharge status: Home / Self Care | Payer: Federal, State, Local not specified - PPO | Source: Ambulatory Visit | Attending: Cardiology | Admitting: Cardiology

## 2018-02-12 ENCOUNTER — Encounter (HOSPITAL_COMMUNITY): Payer: Self-pay | Admitting: *Deleted

## 2018-02-12 DIAGNOSIS — R002 Palpitations: Secondary | ICD-10-CM | POA: Diagnosis not present

## 2018-02-12 DIAGNOSIS — R0789 Other chest pain: Secondary | ICD-10-CM | POA: Insufficient documentation

## 2018-02-12 NOTE — Progress Notes (Unsigned)
Slightly Abnormal ETT was reviewed by Dr. Gwenlyn Found. Patient was given the ok to be discharged.

## 2018-02-13 LAB — EXERCISE TOLERANCE TEST
CHL CUP MPHR: 178 {beats}/min
CHL CUP RESTING HR STRESS: 95 {beats}/min
CSEPPHR: 157 {beats}/min
Estimated workload: 10.1 METS
Exercise duration (min): 8 min
Exercise duration (sec): 0 s
Percent HR: 88 %
RPE: 19

## 2018-02-18 ENCOUNTER — Encounter (HOSPITAL_COMMUNITY): Payer: Self-pay | Admitting: Licensed Clinical Social Worker

## 2018-02-18 ENCOUNTER — Ambulatory Visit (INDEPENDENT_AMBULATORY_CARE_PROVIDER_SITE_OTHER): Payer: Federal, State, Local not specified - PPO | Admitting: Licensed Clinical Social Worker

## 2018-02-18 DIAGNOSIS — F3163 Bipolar disorder, current episode mixed, severe, without psychotic features: Secondary | ICD-10-CM

## 2018-02-18 DIAGNOSIS — F4310 Post-traumatic stress disorder, unspecified: Secondary | ICD-10-CM | POA: Diagnosis not present

## 2018-02-18 NOTE — Progress Notes (Signed)
   THERAPIST PROGRESS NOTE  Session Time: 12:40-1:30pm  Participation Level: Active  Behavioral Response: Well GroomedAlertAngry  Type of Therapy: Individual Therapy  Treatment Goals addressed: Improve psychiatric symptoms, elevate mood (decreased irritability, increased enjoyment of activities), Improve unhelpful thought patterns, emotional regulation skills (reduce temper outburst), Interpersonal relationship skills  Interventions: Motivational Interviewing and Other: Grounding and Mindfulness techniques  Summary: Curtis Townsend is a 42 y.o. male who presents with PTSD and Bipolar I Disorder, mixed, severe  Suicidal/Homicidal: No without intent/plan  Therapist Response:  Curtis Townsend met with clinician for individual therapy. Kathy discussed his psychiatric symptoms and current life events. Curtis Townsend shared that he has been feeling very upset, frustrated and angry about his interactions with his neighbors. Clinician utilized MI OARS to process thoughts and feelings, as well as tease apart any paranoid thoughts from reality. Clinician discussed interactions and identified concerns about racial discord. [name] processed concerns for the state of the world for his family. Clinician validated concerns and encouraged positivity and realistic thought processes in order to improve feelings of anxiety, frustration and depression.   Plan: Return again in 2 weeks.  Diagnosis:     Axis I: PTSD and Bipolar I Disorder, mixed, severe  [name], LCSW 02/18/2018

## 2018-02-28 ENCOUNTER — Ambulatory Visit (HOSPITAL_COMMUNITY): Payer: Self-pay | Admitting: Psychiatry

## 2018-03-04 ENCOUNTER — Other Ambulatory Visit (HOSPITAL_COMMUNITY): Payer: Self-pay | Admitting: Psychiatry

## 2018-03-04 ENCOUNTER — Ambulatory Visit (HOSPITAL_COMMUNITY): Payer: Federal, State, Local not specified - PPO | Admitting: Licensed Clinical Social Worker

## 2018-03-04 DIAGNOSIS — F3163 Bipolar disorder, current episode mixed, severe, without psychotic features: Secondary | ICD-10-CM

## 2018-03-14 ENCOUNTER — Other Ambulatory Visit (HOSPITAL_COMMUNITY): Payer: Self-pay | Admitting: Psychiatry

## 2018-03-14 DIAGNOSIS — F3163 Bipolar disorder, current episode mixed, severe, without psychotic features: Secondary | ICD-10-CM

## 2018-03-18 ENCOUNTER — Encounter (HOSPITAL_COMMUNITY): Payer: Self-pay | Admitting: Licensed Clinical Social Worker

## 2018-03-18 ENCOUNTER — Ambulatory Visit (INDEPENDENT_AMBULATORY_CARE_PROVIDER_SITE_OTHER): Payer: Federal, State, Local not specified - PPO | Admitting: Licensed Clinical Social Worker

## 2018-03-18 DIAGNOSIS — F3163 Bipolar disorder, current episode mixed, severe, without psychotic features: Secondary | ICD-10-CM | POA: Diagnosis not present

## 2018-03-18 DIAGNOSIS — F4310 Post-traumatic stress disorder, unspecified: Secondary | ICD-10-CM | POA: Diagnosis not present

## 2018-03-18 NOTE — Progress Notes (Signed)
   THERAPIST PROGRESS NOTE  Session Time: 12:40pm-1:50pm  Participation Level: Active  Behavioral Response: NeatAlertDepressed  Type of Therapy: Individual Therapy  Treatment Goals addressed: Improve psychiatric symptoms, elevate mood (decreased irritability, increased enjoyment of activities), Improve unhelpful thought patterns, emotional regulation skills (reduce temper outburst), Interpersonal relationship skills  Interventions: Motivational Interviewing and Other: Grounding and Mindfulness techniques  Summary: Curtis Townsend is a 42 y.o. male who presents with PTSD and Bipolar I Disorder, mixed, severe  Suicidal/Homicidal: No without intent/plan  Therapist Response:  Curtis Townsend met with clinician for individual therapy. Curtis Townsend discussed his psychiatric symptoms and current life events. Curtis Townsend shared extreme sadness and anger over his wife who overdosed on her Prozac and is currently in the hospital. Clinician explored the incident details and noted that Curtis Townsend is blaming himself and his anger for causing her this pain. Curtis Townsend spent most of session speaking about how his anger has caused nothing but heartache and pain to everyone around him, starting from birth until today. Clinician provided time and space for Curtis Townsend to process his thoughts and feelings. Clinician offered options for different thoughts, including control over anger, choosing love, and not seeing evil in everyone. Curtis Townsend denied ability to see outside of his anger at this time. Curtis Townsend denied suicidal thoughts, but reported he felt it should be him and not his wife who was in the hospital.   Plan: Return again in 1-2 weeks.  Diagnosis:     Axis I: PTSD and Bipolar I Disorder, mixed, severe  [name], LCSW 03/18/2018

## 2018-03-19 ENCOUNTER — Other Ambulatory Visit (HOSPITAL_COMMUNITY): Payer: Self-pay

## 2018-03-19 DIAGNOSIS — F3163 Bipolar disorder, current episode mixed, severe, without psychotic features: Secondary | ICD-10-CM

## 2018-03-19 MED ORDER — FLUOXETINE HCL 40 MG PO CAPS: 40.0000 mg | ORAL_CAPSULE | Freq: Every day | ORAL | 0 refills | Status: DC
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
## 2018-03-22 ENCOUNTER — Ambulatory Visit (INDEPENDENT_AMBULATORY_CARE_PROVIDER_SITE_OTHER): Payer: Federal, State, Local not specified - PPO | Admitting: Neurology

## 2018-03-22 ENCOUNTER — Other Ambulatory Visit (INDEPENDENT_AMBULATORY_CARE_PROVIDER_SITE_OTHER): Payer: Federal, State, Local not specified - PPO

## 2018-03-22 ENCOUNTER — Encounter: Payer: Self-pay | Admitting: Neurology

## 2018-03-22 VITALS — BP 110/68 | HR 75 | Ht 73.0 in | Wt 184.0 lb

## 2018-03-22 DIAGNOSIS — R209 Unspecified disturbances of skin sensation: Secondary | ICD-10-CM

## 2018-03-22 DIAGNOSIS — R6889 Other general symptoms and signs: Secondary | ICD-10-CM | POA: Diagnosis not present

## 2018-03-22 DIAGNOSIS — IMO0001 Reserved for inherently not codable concepts without codable children: Secondary | ICD-10-CM

## 2018-03-22 DIAGNOSIS — R531 Weakness: Secondary | ICD-10-CM

## 2018-03-22 LAB — VITAMIN B12: Vitamin B-12: 225 pg/mL (ref 211–911)

## 2018-03-22 NOTE — Patient Instructions (Addendum)
Exam looks okay  We will check B12.  Further recommendations pending results.  Your provider has requested that you have labwork completed today. Please go to [facility] ([id number]) on the second floor of this building before leaving the office today. You do not need to check in. If you are not called within 15 minutes please check with the front desk.

## 2018-03-23 ENCOUNTER — Encounter: Payer: Self-pay | Admitting: Internal Medicine

## 2018-03-24 ENCOUNTER — Encounter (HOSPITAL_COMMUNITY): Payer: Self-pay

## 2018-03-24 ENCOUNTER — Emergency Department (HOSPITAL_COMMUNITY)
Admission: EM | Admit: 2018-03-24 | Discharge: 2018-03-24 | Disposition: A | Discharge status: Other Acute Inpatient Hospital | Payer: Federal, State, Local not specified - PPO | Attending: Emergency Medicine | Admitting: Emergency Medicine

## 2018-03-24 ENCOUNTER — Inpatient Hospital Stay (HOSPITAL_COMMUNITY)
Admission: AD | Admit: 2018-03-24 | Discharge: 2018-03-28 | DRG: 885 | Disposition: A | Discharge status: Home / Self Care | Payer: Federal, State, Local not specified - PPO | Attending: Psychiatry | Admitting: Psychiatry

## 2018-03-24 ENCOUNTER — Other Ambulatory Visit: Payer: Self-pay

## 2018-03-24 DIAGNOSIS — Z87442 Personal history of urinary calculi: Secondary | ICD-10-CM

## 2018-03-24 DIAGNOSIS — F258 Other schizoaffective disorders: Secondary | ICD-10-CM | POA: Diagnosis not present

## 2018-03-24 DIAGNOSIS — R45851 Suicidal ideations: Secondary | ICD-10-CM | POA: Insufficient documentation

## 2018-03-24 DIAGNOSIS — K589 Irritable bowel syndrome without diarrhea: Secondary | ICD-10-CM | POA: Diagnosis not present

## 2018-03-24 DIAGNOSIS — F99 Mental disorder, not otherwise specified: Secondary | ICD-10-CM | POA: Diagnosis not present

## 2018-03-24 DIAGNOSIS — Z62810 Personal history of physical and sexual abuse in childhood: Secondary | ICD-10-CM | POA: Diagnosis present

## 2018-03-24 DIAGNOSIS — F250 Schizoaffective disorder, bipolar type: Secondary | ICD-10-CM

## 2018-03-24 DIAGNOSIS — F3130 Bipolar disorder, current episode depressed, mild or moderate severity, unspecified: Secondary | ICD-10-CM | POA: Diagnosis present

## 2018-03-24 DIAGNOSIS — F314 Bipolar disorder, current episode depressed, severe, without psychotic features: Secondary | ICD-10-CM | POA: Diagnosis not present

## 2018-03-24 DIAGNOSIS — F17210 Nicotine dependence, cigarettes, uncomplicated: Secondary | ICD-10-CM | POA: Insufficient documentation

## 2018-03-24 DIAGNOSIS — Z79899 Other long term (current) drug therapy: Secondary | ICD-10-CM | POA: Diagnosis not present

## 2018-03-24 DIAGNOSIS — E785 Hyperlipidemia, unspecified: Secondary | ICD-10-CM | POA: Diagnosis not present

## 2018-03-24 DIAGNOSIS — F4310 Post-traumatic stress disorder, unspecified: Secondary | ICD-10-CM | POA: Diagnosis present

## 2018-03-24 DIAGNOSIS — J302 Other seasonal allergic rhinitis: Secondary | ICD-10-CM | POA: Diagnosis present

## 2018-03-24 DIAGNOSIS — F309 Manic episode, unspecified: Secondary | ICD-10-CM | POA: Diagnosis not present

## 2018-03-24 DIAGNOSIS — Z833 Family history of diabetes mellitus: Secondary | ICD-10-CM | POA: Diagnosis not present

## 2018-03-24 DIAGNOSIS — F319 Bipolar disorder, unspecified: Secondary | ICD-10-CM | POA: Insufficient documentation

## 2018-03-24 DIAGNOSIS — E119 Type 2 diabetes mellitus without complications: Secondary | ICD-10-CM | POA: Diagnosis not present

## 2018-03-24 DIAGNOSIS — F419 Anxiety disorder, unspecified: Secondary | ICD-10-CM | POA: Diagnosis not present

## 2018-03-24 LAB — RAPID URINE DRUG SCREEN, HOSP PERFORMED
AMPHETAMINES: NOT DETECTED
BENZODIAZEPINES: NOT DETECTED
Barbiturates: NOT DETECTED
Cocaine: NOT DETECTED
OPIATES: NOT DETECTED
Tetrahydrocannabinol: POSITIVE — AB

## 2018-03-24 LAB — CBC
HCT: 44.3 % (ref 39.0–52.0)
Hemoglobin: 15.7 g/dL (ref 13.0–17.0)
MCH: 32.4 pg (ref 26.0–34.0)
MCHC: 35.4 g/dL (ref 30.0–36.0)
MCV: 91.3 fL (ref 78.0–100.0)
PLATELETS: 177 10*3/uL (ref 150–400)
RBC: 4.85 MIL/uL (ref 4.22–5.81)
RDW: 12.5 % (ref 11.5–15.5)
WBC: 6.6 10*3/uL (ref 4.0–10.5)

## 2018-03-24 LAB — COMPREHENSIVE METABOLIC PANEL
ALBUMIN: 4.4 g/dL (ref 3.5–5.0)
ALT: 25 U/L (ref 0–44)
AST: 27 U/L (ref 15–41)
Alkaline Phosphatase: 78 U/L (ref 38–126)
Anion gap: 10 (ref 5–15)
BUN: 16 mg/dL (ref 6–20)
CHLORIDE: 110 mmol/L (ref 98–111)
CO2: 26 mmol/L (ref 22–32)
Calcium: 10 mg/dL (ref 8.9–10.3)
Creatinine, Ser: 1.38 mg/dL — ABNORMAL HIGH (ref 0.61–1.24)
GFR calc Af Amer: >60 mL/min (ref >60)
Glucose, Bld: 123 mg/dL — ABNORMAL HIGH (ref 70–99)
POTASSIUM: 3.7 mmol/L (ref 3.5–5.1)
SODIUM: 146 mmol/L — AB (ref 135–145)
Total Bilirubin: 0.9 mg/dL (ref 0.3–1.2)
Total Protein: 7.7 g/dL (ref 6.5–8.1)

## 2018-03-24 LAB — ACETAMINOPHEN LEVEL: Acetaminophen (Tylenol), Serum: <10 ug/mL — ABNORMAL LOW (ref 10–30)

## 2018-03-24 LAB — ETHANOL

## 2018-03-24 LAB — SALICYLATE LEVEL: Salicylate Lvl: <7 mg/dL (ref 2.8–30.0)

## 2018-03-24 MED ORDER — GUAIFENESIN ER 600 MG PO TB12
600.0000 mg | ORAL_TABLET | Freq: Two times a day (BID) | ORAL | Status: DC
  Administered 2018-03-24 (×2): 600 mg via ORAL
  Filled 2018-03-24 (×2): qty 1

## 2018-03-24 MED ORDER — ACETAMINOPHEN 325 MG PO TABS
650.0000 mg | ORAL_TABLET | Freq: Four times a day (QID) | ORAL | Status: DC | PRN
  Filled 2018-03-24: qty 2

## 2018-03-24 MED ORDER — DIVALPROEX SODIUM ER 500 MG PO TB24
500.0000 mg | ORAL_TABLET | Freq: Two times a day (BID) | ORAL | Status: DC
Start: 2018-03-24 — End: 2018-03-24
  Administered 2018-03-24 (×2): 500 mg via ORAL
  Filled 2018-03-24 (×2): qty 1

## 2018-03-24 MED ORDER — FLUOXETINE HCL 20 MG PO CAPS
40.0000 mg | ORAL_CAPSULE | Freq: Every day | ORAL | Status: DC
Start: 2018-03-24 — End: 2018-03-24
  Administered 2018-03-24: 40 mg via ORAL
  Filled 2018-03-24: qty 2

## 2018-03-24 MED ORDER — FLUOXETINE HCL 20 MG PO CAPS
40.0000 mg | ORAL_CAPSULE | Freq: Every day | ORAL | Status: DC
  Administered 2018-03-25: 40 mg via ORAL
  Filled 2018-03-24 (×4): qty 2

## 2018-03-24 MED ORDER — ALUM & MAG HYDROXIDE-SIMETH 200-200-20 MG/5ML PO SUSP: 30.0000 mL | ORAL | Status: DC | PRN

## 2018-03-24 MED ORDER — HYDROXYZINE HCL 25 MG PO TABS: 25.0000 mg | ORAL_TABLET | Freq: Three times a day (TID) | ORAL | Status: DC | PRN

## 2018-03-24 MED ORDER — ZIPRASIDONE MESYLATE 20 MG IM SOLR
20.0000 mg | Freq: Two times a day (BID) | INTRAMUSCULAR | Status: DC | PRN
  Administered 2018-03-24: 20 mg via INTRAMUSCULAR
  Filled 2018-03-24: qty 20

## 2018-03-24 MED ORDER — STERILE WATER FOR INJECTION IJ SOLN
INTRAMUSCULAR | Status: AC
  Filled 2018-03-24: qty 10

## 2018-03-24 MED ORDER — GUAIFENESIN ER 600 MG PO TB12
600.0000 mg | ORAL_TABLET | Freq: Two times a day (BID) | ORAL | Status: DC
  Administered 2018-03-25 – 2018-03-27 (×6): 600 mg via ORAL
  Filled 2018-03-24 (×11): qty 1

## 2018-03-24 MED ORDER — DIVALPROEX SODIUM ER 500 MG PO TB24
500.0000 mg | ORAL_TABLET | Freq: Two times a day (BID) | ORAL | Status: DC
  Administered 2018-03-25: 500 mg via ORAL
  Filled 2018-03-24 (×6): qty 1

## 2018-03-24 MED ORDER — ACETAMINOPHEN 325 MG PO TABS
650.0000 mg | ORAL_TABLET | Freq: Four times a day (QID) | ORAL | Status: DC | PRN
  Administered 2018-03-24: 650 mg via ORAL
  Filled 2018-03-24: qty 2

## 2018-03-24 MED ORDER — ACETAMINOPHEN 325 MG PO TABS
650.0000 mg | ORAL_TABLET | Freq: Four times a day (QID) | ORAL | Status: DC | PRN
  Administered 2018-03-25: 650 mg via ORAL

## 2018-03-24 MED ORDER — MAGNESIUM HYDROXIDE 400 MG/5ML PO SUSP: 30.0000 mL | Freq: Every day | ORAL | Status: DC | PRN

## 2018-03-24 MED ORDER — ZIPRASIDONE MESYLATE 20 MG IM SOLR: 20.0000 mg | Freq: Two times a day (BID) | INTRAMUSCULAR | Status: DC | PRN

## 2018-03-24 MED ORDER — TRAZODONE HCL 50 MG PO TABS
50.0000 mg | ORAL_TABLET | Freq: Every evening | ORAL | Status: DC | PRN
  Administered 2018-03-25 – 2018-03-28 (×2): 50 mg via ORAL
  Filled 2018-03-24: qty 7
  Filled 2018-03-24 (×2): qty 1

## 2018-03-24 NOTE — ED Notes (Addendum)
PT REFUSING VITALS. HE IS NOT IVC'D AT THIS TIME.

## 2018-03-24 NOTE — ED Notes (Signed)
Tennis shoes, black t shirt, black denim jeans

## 2018-03-24 NOTE — Progress Notes (Signed)
Patient accepted to [facility].  Call for report: [place], [place] Worker [phone]

## 2018-03-24 NOTE — ED Notes (Signed)
Report called to [facility].  Pt to 500 hall.  Pt meds given.  Transport called.  Pt denies Pain or Discomfort. Pt denies SI, HI and AVH and contracts for safety

## 2018-03-24 NOTE — ED Notes (Signed)
Pt resting in bed. Affect flat, mood depressed, behavior appropriate. Speech excessively soft. C/o cold symptoms with mucus that caused him to vomit.

## 2018-03-24 NOTE — ED Notes (Signed)
Pt's wife's name is [name] and her number is [phone].

## 2018-03-24 NOTE — ED Provider Notes (Signed)
[facility] DEPT Provider Note   CSN: [id number] Arrival date & time: 03/24/18  0046     History   Chief Complaint Chief Complaint  Patient presents with  . Suicidal    HPI Curtis Townsend is a 42 y.o. male.  Patient presents to the emergency department by [employer] for suicidal behavior..  [employer] he had a knife to himself, [employer] had to draw their firearm to get him to drop it.  He attempted to grab it again, but ended up not harming himself.  He states that he is manic.  He reports that his sister committed suicide.  He does have a history of bipolar and mania.  He denies taking anything for his symptoms.  Denies any drug or alcohol use.  The PD reports that he that he was going to shoot himself.  He is under IVC at this time filed by [employer].  The history is provided by the patient. No language interpreter was used.    Past Medical History:  Diagnosis Date  . Alcoholism ([place])   . Anxiety   . Atypical chest pain   . Bipolar 1 disorder ([place])   . Depression   . Depression   . Diabetes ([place])   . History of chicken pox   . History of kidney stones   . Hyperlipemia    no current med.  . IBS (irritable bowel syndrome)   . Lipoma of back 05/2012   right  . Seasonal allergies   . Wears partial dentures    upper    Patient Active Problem List   Diagnosis Date Noted  . Atypical chest pain 02/06/2018  . Pure hypercholesterolemia 03/14/2017  . Pure hyperglyceridemia 03/14/2017  . Family history of colon cancer in father 03/14/2017  . B12 deficiency 06/28/2016  . Vitamin B12 deficiency neuropathy ([place]) 06/08/2016  . Routine general medical examination at a health care facility 01/27/2015  . Bipolar I disorder ([place]) 12/29/2013  . Neurosis, posttraumatic 12/29/2013  . Bipolar affective disorder, depressed ([place]) 08/04/2013    Past Surgical History:  Procedure Laterality Date  . LIPOMA EXCISION  06/10/2012   Procedure: EXCISION LIPOMA;  Surgeon: Ralene Ok, MD;  Location: [facility];  Service: General;  Laterality: Right;  excision of back lipoma on right 3x3cm        Home Medications    Prior to Admission medications   Medication Sig Start Date End Date Taking? Authorizing Provider  aspirin 81 MG chewable tablet Chew 81 mg by mouth as needed for mild pain.    [provider]  divalproex (DEPAKOTE ER) 500 MG 24 hr tablet TAKE 1 TABLET BY MOUTH EVERY DAY 02/19/18   Arfeen, Arlyce Harman, MD  divalproex (DEPAKOTE ER) 500 MG 24 hr tablet Take 1 tablet (500 mg total) by mouth daily. 01/30/18 01/30/19  Arfeen, Arlyce Harman, MD  FLUoxetine (PROZAC) 40 MG capsule Take 1 capsule (40 mg total) by mouth daily. 03/19/18   Hampton Abbot, MD  ibuprofen (ADVIL,MOTRIN) 200 MG tablet Take 200 mg by mouth daily as needed (pain).    [provider]  ketorolac (TORADOL) 10 MG tablet Take 1 tablet (10 mg total) by mouth every 6 (six) hours as needed. Patient not taking: Reported on 03/22/2018 12/31/17   Isla Pence, MD  methocarbamol (ROBAXIN) 750 MG tablet Take 1-2 tablets (750-1,500 mg total) by mouth 3 (three) times daily as needed for muscle spasms. Patient not taking: Reported on 03/22/2018 11/03/17  Lorin Glass, PA-C    Family History Family History  Problem Relation Age of Onset  . Stroke Mother   . Hypertension Mother   . Diabetes Mother   . Hyperlipidemia Mother   . Hypertension Father   . Diabetes Father   . Schizophrenia Father   . Hypertension Sister   . Hypertension Brother   . Hyperlipidemia Brother   . Cancer Paternal Uncle        Prostate Cancer  . Stroke Maternal Grandmother   . Heart attack Maternal Grandmother   . Diabetes Paternal Grandmother   . High Cholesterol Paternal Grandmother   . Hypertension Brother   . Hyperlipidemia Brother   . Cancer Sister   . High blood pressure Sister   . Hyperlipidemia Sister     Social History Social History   Tobacco Use  . Smoking status: Current  Some Day Smoker    Packs/day: 0.10    Years: 16.00    Pack years: 1.60    Types: Cigarettes  . Smokeless tobacco: Never Used  . Tobacco comment: 1-2 cig./month  Substance Use Topics  . Alcohol use: Yes    Alcohol/week: 0.0 standard drinks    Comment: Occasional use  . Drug use: Yes    Comment: states daily THC     Allergies   Patient has no known allergies.   Review of Systems Review of Systems  All other systems reviewed and are negative.    Physical Exam Updated Vital Signs There were no vitals taken for this visit.  Physical Exam  Constitutional: He is oriented to person, place, and time. He appears well-developed and well-nourished.  HENT:  Head: Normocephalic and atraumatic.  Eyes: Pupils are equal, round, and reactive to light. Conjunctivae and EOM are normal. Right eye exhibits no discharge. Left eye exhibits no discharge. No scleral icterus.  Neck: Normal range of motion. Neck supple. No JVD present.  Cardiovascular: Normal rate, regular rhythm and normal heart sounds. Exam reveals no gallop and no friction rub.  No murmur heard. Pulmonary/Chest: Effort normal and breath sounds normal. No respiratory distress. He has no wheezes. He has no rales. He exhibits no tenderness.  Abdominal: Soft. He exhibits no distension and no mass. There is no tenderness. There is no rebound and no guarding.  Musculoskeletal: Normal range of motion. He exhibits no edema or tenderness.  Neurological: He is alert and oriented to person, place, and time.  Skin: Skin is warm and dry.  Psychiatric:  Manic  Nursing note and vitals reviewed.    ED Treatments / Results  Labs (all labs ordered are listed, but only abnormal results are displayed) Labs Reviewed  COMPREHENSIVE METABOLIC PANEL - Abnormal; Notable for the following components:      Result Value   Sodium 146 (*)    Glucose, Bld 123 (*)    Creatinine, Ser 1.38 (*)    All other components within normal limits    ACETAMINOPHEN LEVEL - Abnormal; Notable for the following components:   Acetaminophen (Tylenol), Serum <10 (*)    All other components within normal limits  RAPID URINE DRUG SCREEN, HOSP PERFORMED - Abnormal; Notable for the following components:   Tetrahydrocannabinol POSITIVE (*)    All other components within normal limits  ETHANOL  SALICYLATE LEVEL  CBC    EKG None  Radiology No results found.  Procedures Procedures (including critical care time)  Medications Ordered in ED Medications  ziprasidone (GEODON) injection 20 mg (has no administration in time  range)  sterile water (preservative free) injection (has no administration in time range)     Initial Impression / Assessment and Plan / ED Course  I have reviewed the triage vital signs and the nursing notes.  Pertinent labs & imaging results that were available during my care of the patient were reviewed by me and considered in my medical decision making (see chart for details).     Patient under IVC.  Told [employer] that he was going to kill himself by shooting himself.  He also had a knife when [employer] picked him up, and was holding the knife to himself.  Patient IVC by [employer].  Sent is in handcuffs on the stretcher due to concern for patient and staff safety.  Will give chemical restraint in hopes of preserving safety for staff and patient, but hopefully enabling [name] to remove the physical restraints.  TTS consult pending.  Recommendation is for re-evaluation in AM.  Final Clinical Impressions(s) / ED Diagnoses   Final diagnoses:  Mania [facility])    ED Discharge Orders    None       Montine Circle, PA-C 03/24/18 [id number]    Rolland Porter, MD 03/24/18 [phone]
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
## 2018-03-24 NOTE — Progress Notes (Signed)
Pt picked up by [employer] and escorted off unit. Pt denies pain or discomfort Pt denies SI, HI and AVH Pt verbally contracts for safety.

## 2018-03-24 NOTE — Progress Notes (Signed)
This patient continues to meet inpatient criteria. CSW faxed information to the following facilities:   [facility] [place], [place] Social Worker [phone]

## 2018-03-24 NOTE — ED Notes (Signed)
Pt complains of nasal congestion and this causes him to gag resulting in vomiting. According to pt he has a sensitive gag reflex. Congestion has been treated with Mucinex.  Pt continues to be calm and cooperative.
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
## 2018-03-24 NOTE — ED Notes (Signed)
Pt is calm and cooperative.  Pt understands he will transfer tonight to [facility].  Pt sts he "has a lot of stuff to work out".  Pt has been to [facility] before and understands the program at [facility].  Pt sts he will be compliant with treatment.  Pt is soft spoken, and sts he is ready for treatment.  Pt denies SI, HI and AVH.  Pt contracts for safety, verbally.  Pt denies pain or discomfort. Pt is in room watching TV Pt remains safe on unit

## 2018-03-24 NOTE — ED Notes (Signed)
Bed: WA20 Expected date:  Expected time:  Means of arrival:  Comments: The 3rd IVC

## 2018-03-24 NOTE — BHH Counselor (Signed)
Patient accepted to [facility]; patient can arrive after 9:30pm

## 2018-03-25 ENCOUNTER — Other Ambulatory Visit: Payer: Self-pay

## 2018-03-25 DIAGNOSIS — F314 Bipolar disorder, current episode depressed, severe, without psychotic features: Principal | ICD-10-CM

## 2018-03-25 MED ORDER — ARIPIPRAZOLE 10 MG PO TABS
10.0000 mg | ORAL_TABLET | Freq: Every day | ORAL | Status: DC
  Administered 2018-03-25 – 2018-03-28 (×4): 10 mg via ORAL
  Filled 2018-03-25 (×6): qty 1

## 2018-03-25 MED ORDER — INFLUENZA VAC SPLIT QUAD 0.5 ML IM SUSY
0.5000 mL | PREFILLED_SYRINGE | INTRAMUSCULAR | Status: DC
  Filled 2018-03-25: qty 0.5

## 2018-03-25 MED ORDER — HYDROXYZINE HCL 50 MG PO TABS
50.0000 mg | ORAL_TABLET | Freq: Four times a day (QID) | ORAL | Status: DC | PRN
  Administered 2018-03-25: 50 mg via ORAL
  Filled 2018-03-25: qty 1
  Filled 2018-03-25: qty 10
  Filled 2018-03-25: qty 1

## 2018-03-25 MED ORDER — ONDANSETRON 4 MG PO TBDP
4.0000 mg | ORAL_TABLET | Freq: Three times a day (TID) | ORAL | Status: DC | PRN
  Administered 2018-03-25 – 2018-03-27 (×2): 4 mg via ORAL
  Filled 2018-03-25 (×2): qty 1

## 2018-03-25 NOTE — Tx Team (Signed)
Initial Treatment Plan 03/25/2018 2:54 AM Helyn Numbers [id number]    PATIENT STRESSORS: Financial difficulties Medication change or noncompliance Substance abuse   PATIENT STRENGTHS: Communication skills Physical Health Supportive family/friends   PATIENT IDENTIFIED PROBLEMS: "My wife just know how to get to me"  "I have not been taking my medicine"  Risk for Suicide  Depression  Anxiety  Substance Abuse           DISCHARGE CRITERIA:  Ability to meet basic life and health needs Adequate post-discharge living arrangements Motivation to continue treatment in a less acute level of care Verbal commitment to aftercare and medication compliance  PRELIMINARY DISCHARGE PLAN: Return to previous living arrangement  PATIENT/FAMILY INVOLVEMENT: This treatment plan has been presented to and reviewed with the patient, Curtis Townsend, and/or family member. The patient and family have been given the opportunity to ask questions and make suggestions.  Leia Alf, RN 03/25/2018, 2:54 AM

## 2018-03-25 NOTE — Progress Notes (Signed)
Patient ID: RANE BLITCH, male   DOB: [birth year], 42 y.o.   MRN: [id number] Per State regulations 482.30 this chart was reviewed for medical necessity with respect to the patient's admission/duration of stay.    Next review date: 03/28/18  Debarah Crape, BSN, RN-BC  Case Manager

## 2018-03-25 NOTE — Progress Notes (Signed)
Recreation Therapy Notes  INPATIENT RECREATION THERAPY ASSESSMENT  Patient Details Name: Curtis Townsend MRN: [id number] DOB: [birth year] Today's Date: 03/25/2018       Information Obtained From: Patient  Able to Participate in Assessment/Interview: Yes  Patient Presentation: Alert  Reason for Admission (Per Patient): Suicide Attempt, Other (Comments)(Anger)  Patient Stressors: Relationship(Pt stated his marriage)  Coping Skills:   (Pt stated he needs some coping skills)  Leisure Interests (2+):  Individual - Reading, Joretta Bachelor care  Frequency of Recreation/Participation: Weekly  Awareness of Community Resources:  Yes  Community Resources:  [place], [place]  Current Use: Yes  If no, Barriers?:    Expressed Interest in [place]: No  [employer] of Residence:  Investment banker, corporate  Patient Main Form of Transportation: Musician  Patient Strengths:  Good listener; Help others  Patient Identified Areas of Improvement:  Anger; Learn to let go  Patient Goal for Hospitalization:  "Manage anger"  Current SI (including self-harm):  No  Current HI:  No  Current AVH: No  Staff Intervention Plan: Group Attendance, Collaborate with Interdisciplinary Treatment Team  Consent to Intern Participation: N/A    Victorino Sparrow, LRT/CTRS  Victorino Sparrow A 03/25/2018, 2:14 PM

## 2018-03-25 NOTE — Progress Notes (Signed)
Recreation Therapy Notes  Date: 9.16.19 Time: 1000 Location: [facility] Dayroom  Group Topic: Wellness  Goal Area(s) Addresses:  Patient will define components of whole wellness. Patient will verbalize benefit of whole wellness.  Behavioral Response: Engaged  Intervention:  Exercise, Music  Activity: Exercise.  LRT lead the group through a series of stretches.  Each group member would then lead the group in an exercise of their choice.  Patients and LRT went through a number of exercises and dance moves to get the heart pumping and blood flowing.  Education: Wellness, Dentist.   Education Outcome: Acknowledges education/In group clarification offered/Needs additional education.   Clinical Observations/Feedback:  Pt was very engaged and in tune to the activity.  Pt was appropriate but left early after stating he did one move to hard and tweaked his back.    Victorino Sparrow, LRT/CTRS     Victorino Sparrow A 03/25/2018 12:25 PM
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
## 2018-03-25 NOTE — BHH Group Notes (Signed)
LCSW Group Therapy Note   03/25/2018 1:15pm   Type of Therapy and Topic:  Group Therapy:  Overcoming Obstacles   Participation Level:  Did Not Attend   Description of Group:    In this group patients will be encouraged to explore what they see as obstacles to their own wellness and recovery. They will be guided to discuss their thoughts, feelings, and behaviors related to these obstacles. The group will process together ways to cope with barriers, with attention given to specific choices patients can make. Each patient will be challenged to identify changes they are motivated to make in order to overcome their obstacles. This group will be process-oriented, with patients participating in exploration of their own experiences as well as giving and receiving support and challenge from other group members.   Therapeutic Goals: 1. Patient will identify personal and current obstacles as they relate to admission. 2. Patient will identify barriers that currently interfere with their wellness or overcoming obstacles.  3. Patient will identify feelings, thought process and behaviors related to these barriers. 4. Patient will identify two changes they are willing to make to overcome these obstacles:      Summary of Patient Progress      Therapeutic Modalities:   Cognitive Behavioral Therapy Solution Focused Therapy Motivational Interviewing Relapse Prevention [place], [place] 03/25/2018 2:59 PM

## 2018-03-25 NOTE — Progress Notes (Signed)
Patient ID: Curtis Townsend, male   DOB: 1976/03/23, 42 y.o.   MRN: [id number] Admission Note Pt is a 42 y/o male admitted onto the [facility] adult unit on an involuntary basis. On admission, Pt appear to be anxious however; cooperative. "I not happy to be here; my family don't have the right to bring me here." Pt admitted that he has not been compliant with his medications and has been feeling increasingly depressed, sad and anxious for about some weeks. Pt endorsed moderate anxiety, depression and worrying. Pt admitted that was feeling suicidal earlier today but denied SI/HI at admission; "I wasn't myself earlier but I have had time to get myself together." Pt also endorse mild right side neck pain-see MAR. Pt denied AVH at this time. Pt endorsed THC use (UDS positive for THC) however, Pt denied tobacco or recent alcohol use. Support, encouragement, and safe environment provided.  15-minute safety checks initiated and continued. Pt remained cooperative through the admission process. Pt remained alert and oriented through the admission process. Pt searched and no contrabands found.
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
## 2018-03-25 NOTE — Progress Notes (Signed)
Recreation Therapy Notes  INPATIENT RECREATION THERAPY ASSESSMENT  Patient Details Name: Curtis Townsend MRN: [id number] DOB: [birth year] Today's Date: 03/25/2018       Information Obtained From: Patient  Able to Participate in Assessment/Interview: Yes  Patient Presentation: Alert  Reason for Admission (Per Patient): Suicide Attempt, Other (Comments)(Anger)  Patient Stressors: Relationship(Pt stated his marriage)  Coping Skills:   (Pt stated he needs some coping skills)  Leisure Interests (2+):  Individual - Reading, Joretta Bachelor care  Frequency of Recreation/Participation: Weekly  Awareness of Community Resources:  Yes  Community Resources:  [place], [place]  Current Use: Yes  If no, Barriers?:    Expressed Interest in [place]: No  [employer] of Residence:  Investment banker, corporate  Patient Main Form of Transportation: Musician  Patient Strengths:  Good listener; Help others  Patient Identified Areas of Improvement:  Anger; Learn to let go  Patient Goal for Hospitalization:  "Manage anger"  Current SI (including self-harm):  No  Current HI:  No  Current AVH: No  Staff Intervention Plan: Group Attendance, Collaborate with Interdisciplinary Treatment Team  Consent to Intern Participation: N/A    Victorino Sparrow, LRT/CTRS   Ria Comment, Ileanna Gemmill A 03/25/2018, 2:03 PM
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
## 2018-03-25 NOTE — Tx Team (Signed)
Interdisciplinary Treatment and Diagnostic Plan Update  03/25/2018 Time of Session: 4:39 PM  Curtis Townsend MRN: [id number]  Principal Diagnosis: Bipolar I disorder, most recent episode depressed, severe without psychotic features ([place])  Secondary Diagnoses: Principal Problem:   Bipolar I disorder, most recent episode depressed, severe without psychotic features ([place])   Current Medications:  Current Facility-Administered Medications  Medication Dose Route Frequency Provider Last Rate Last Dose  . acetaminophen (TYLENOL) tablet 650 mg  650 mg Oral Q6H PRN Ethelene Hal, NP   650 mg at 03/25/18 1018  . alum & mag hydroxide-simeth (MAALOX/MYLANTA) 200-200-20 MG/5ML suspension 30 mL  30 mL Oral Q4H PRN Ethelene Hal, NP      . ARIPiprazole (ABILIFY) tablet 10 mg  10 mg Oral Daily Pennelope Bracken, MD      . guaiFENesin (MUCINEX) 12 hr tablet 600 mg  600 mg Oral BID Ethelene Hal, NP   600 mg at 03/25/18 0756  . hydrOXYzine (ATARAX/VISTARIL) tablet 50 mg  50 mg Oral Q6H PRN Pennelope Bracken, MD      . Derrill Memo ON 03/26/2018] Influenza vac split quadrivalent PF (FLUARIX) injection 0.5 mL  0.5 mL Intramuscular Tomorrow-1000 Maris Berger T, MD      . magnesium hydroxide (MILK OF MAGNESIA) suspension 30 mL  30 mL Oral Daily PRN Ethelene Hal, NP      . traZODone (DESYREL) tablet 50 mg  50 mg Oral QHS PRN Ethelene Hal, NP      . ziprasidone (GEODON) injection 20 mg  20 mg Intramuscular Q12H PRN Ethelene Hal, NP        PTA Medications: Medications Prior to Admission  Medication Sig Dispense Refill Last Dose  . divalproex (DEPAKOTE ER) 500 MG 24 hr tablet Take 1 tablet (500 mg total) by mouth daily. (Patient not taking: Reported on 03/24/2018) 30 tablet 0 Not Taking at Unknown time  . FLUoxetine (PROZAC) 40 MG capsule Take 1 capsule (40 mg total) by mouth daily. (Patient not taking: Reported on 03/24/2018) 90 capsule 0 Not  Taking at Unknown time  . ketorolac (TORADOL) 10 MG tablet Take 1 tablet (10 mg total) by mouth every 6 (six) hours as needed. (Patient not taking: Reported on 03/22/2018) 10 tablet 0 Not Taking at Unknown time  . methocarbamol (ROBAXIN) 750 MG tablet Take 1-2 tablets (750-1,500 mg total) by mouth 3 (three) times daily as needed for muscle spasms. (Patient not taking: Reported on 03/22/2018) 18 tablet 0 Not Taking at Unknown time  . naproxen sodium (ALEVE) 220 MG tablet Take 440 mg by mouth daily as needed (for pain or headache).   03/23/2018 at Unknown time    Patient Stressors: Financial difficulties Medication change or noncompliance Substance abuse  Patient Strengths: Armed forces logistics/support/administrative officer Physical Health Supportive family/friends  Treatment Modalities: Medication Management, Group therapy, Case management,  1 to 1 session with clinician, Psychoeducation, Recreational therapy.   Physician Treatment Plan for Primary Diagnosis: Bipolar I disorder, most recent episode depressed, severe without psychotic features ([place]) Long Term Goal(s): Improvement in symptoms so as ready for discharge  Short Term Goals: Ability to identify changes in lifestyle to reduce recurrence of condition will improve Ability to identify and develop effective coping behaviors will improve Ability to identify triggers associated with substance abuse/mental health issues will improve Ability to identify changes in lifestyle to reduce recurrence of condition will improve Ability to identify and develop effective coping behaviors will improve Ability to identify triggers associated with substance  abuse/mental health issues will improve  Medication Management: Evaluate patient's response, side effects, and tolerance of medication regimen.  Therapeutic Interventions: 1 to 1 sessions, Unit Group sessions and Medication administration.  Evaluation of Outcomes: Progressing  Physician Treatment Plan for Secondary  Diagnosis: Principal Problem:   Bipolar I disorder, most recent episode depressed, severe without psychotic features ([place])   Long Term Goal(s): Improvement in symptoms so as ready for discharge  Short Term Goals: Ability to identify changes in lifestyle to reduce recurrence of condition will improve Ability to identify and develop effective coping behaviors will improve Ability to identify triggers associated with substance abuse/mental health issues will improve Ability to identify changes in lifestyle to reduce recurrence of condition will improve Ability to identify and develop effective coping behaviors will improve Ability to identify triggers associated with substance abuse/mental health issues will improve  Medication Management: Evaluate patient's response, side effects, and tolerance of medication regimen.  Therapeutic Interventions: 1 to 1 sessions, Unit Group sessions and Medication administration.  Evaluation of Outcomes: Progressing   RN Treatment Plan for Primary Diagnosis: Bipolar I disorder, most recent episode depressed, severe without psychotic features ([place]) Long Term Goal(s): Knowledge of disease and therapeutic regimen to maintain health will improve  Short Term Goals: Ability to identify and develop effective coping behaviors will improve and Compliance with prescribed medications will improve  Medication Management: RN will administer medications as ordered by provider, will assess and evaluate patient's response and provide education to patient for prescribed medication. RN will report any adverse and/or side effects to prescribing provider.  Therapeutic Interventions: 1 on 1 counseling sessions, Psychoeducation, Medication administration, Evaluate responses to treatment, Monitor vital signs and CBGs as ordered, Perform/monitor CIWA, COWS, AIMS and Fall Risk screenings as ordered, Perform wound care treatments as ordered.  Evaluation of Outcomes:  Progressing   LCSW Treatment Plan for Primary Diagnosis: Bipolar I disorder, most recent episode depressed, severe without psychotic features ([place]) Long Term Goal(s): Safe transition to appropriate next level of care at discharge, Engage patient in therapeutic group addressing interpersonal concerns.  Short Term Goals: Engage patient in aftercare planning with referrals and resources  Therapeutic Interventions: Assess for all discharge needs, 1 to 1 time with Social worker, Explore available resources and support systems, Assess for adequacy in community support network, Educate family and significant other(s) on suicide prevention, Complete Psychosocial Assessment, Interpersonal group therapy.  Evaluation of Outcomes: Met  Return home, follow up outpt   Progress in Treatment: Attending groups: Yes Participating in groups: Yes Taking medication as prescribed: Yes Toleration medication: Yes, no side effects reported at this time Family/Significant other contact made: No Patient understands diagnosis: Yes AEB asking for help with mental health symptoms Discussing patient identified problems/goals with staff: Yes Medical problems stabilized or resolved: Yes Denies suicidal/homicidal ideation: Yes Issues/concerns per patient self-inventory: None Other: N/A  New problem(s) identified: None identified at this time.   New Short Term/Long Term Goal(s): "I want help with managing my anger."  Discharge Plan or Barriers:   Reason for Continuation of Hospitalization: Mood instability Depression Medication stabilization SI  Estimated Length of Stay: 9/20  Attendees: Patient: Curtis Townsend 03/25/2018  4:39 PM  Physician: Maris Berger, MD 03/25/2018  4:39 PM  Nursing: Sena Hitch, RN 03/25/2018  4:39 PM  RN Care Manager: Lars Pinks, RN 03/25/2018  4:39 PM  Social Worker: Ripley Fraise 03/25/2018  4:39 PM  Recreational Therapist: Winfield Cunas 03/25/2018  4:39 PM   Other: Norberto Sorenson 03/25/2018  4:39 PM  Other:  03/25/2018  4:39 PM    Scribe for Treatment Team:  Roque Lias LCSW 03/25/2018 4:39 PM

## 2018-03-26 MED ORDER — BENZTROPINE MESYLATE 1 MG PO TABS
1.0000 mg | ORAL_TABLET | Freq: Two times a day (BID) | ORAL | Status: DC | PRN
  Filled 2018-03-26: qty 14

## 2018-03-26 NOTE — Progress Notes (Signed)
DAR Note: Pt remained in his room for most of the night. Pt at assessment complained of moderate anxiety and depression. Pt also complained of NV-see MAR. Pt did not look to be in any acute distress. Support, encouragement, and safe environment provide. Will continue to monitor for safety. Pt did not attend PM group.

## 2018-03-26 NOTE — Plan of Care (Signed)
  Problem: Education: Goal: Emotional status will improve Outcome: Progressing   Problem: Safety: Goal: Periods of time without injury will increase Outcome: Progressing   Problem: Physical Regulation: Goal: Complications related to the disease process, condition or treatment will be avoided or minimized Outcome: Progressing DAR NOTE: Patient presents with calm affect and depressed mood.  Denies suicidal thoughts, pain, auditory and visual hallucinations.  Rates depression at 2, hopelessness at2, and anxiety at 1.  Maintained on routine safety checks.  Medications given as prescribed.  Support and encouragement offered as needed.  Attended group and participated.  States goal for today is "stay calm."  Patient observed socializing with peers in the dayroom.  Offered no complaint.

## 2018-03-26 NOTE — Progress Notes (Signed)
[facility] MD Progress Note  03/26/2018 12:04 PM Curtis Townsend  MRN:  [id number]   Subjective:  Curtis Townsend is a 42 y/o male with history oftreatment for bipolar disorder type 1 and PTSDwho [facility] initiated by policewith suicidal ideation and threatening self harm. He states that he and his wife got into a fight about her recent hospitalization. He got every angry at her and grabbed a knife which he held up to his throat and threatened to kill himself. At that point, his daughter called the police and he was taken to the ED.He was given Geodon in the ED due to continued agitation. He was deemed medically stable in the ED and was recommended for inpatient psychiatric care. During this admission, his home fluoxetine and depakote was discontinued, and he was started on a trial of Abilify 10mg  po qDay.  Today he states his mood feels much better, but he has had a few bouts of emesis last night and this morning. He attributes this emesis to his head cold and the food he ate last night. However, he still has an appetite. He did not sleep well which he attributes to his cold. He further complains of some jaw pain and restlessness yesterday after taking his first dose of Abilify, but states that has resolved today. He denies SI/HI/AH/VH. We discussed trial of propanolol versus Cogentin for extrapyramidal symptoms, with the added benefit of Cogentin to assist with the vomiting. He was in agreement to a trial of Congentin q12 hours PRN for symptoms of restlessness or continued vomiting.   Principal Problem: Bipolar I disorder, most recent episode depressed, severe without psychotic features ([place]) Diagnosis:   Patient Active Problem List   Diagnosis Date Noted  . Schizoaffective disorder, mixed type ([place]) [F25.0] 03/24/2018  . Bipolar I disorder, most recent episode depressed, severe without psychotic features ([place]) [F31.4] 03/24/2018  . Atypical chest pain [R07.89] 02/06/2018  .  Pure hypercholesterolemia [E78.00] 03/14/2017  . Pure hyperglyceridemia [E78.1] 03/14/2017  . Family history of colon cancer in father [Z80.0] 03/14/2017  . B12 deficiency [E53.8] 06/28/2016  . Vitamin B12 deficiency neuropathy ([place]) [E53.8, G63] 06/08/2016  . Routine general medical examination at a health care facility [Z00.00] 01/27/2015  . Bipolar I disorder ([place]) [F31.9] 12/29/2013  . Neurosis, posttraumatic [F43.10] 12/29/2013  . Bipolar affective disorder, depressed ([place]) [F31.30] 08/04/2013   Total Time spent with patient: 30 minutes  Past Psychiatric History: See H&P  Past Medical History:  Past Medical History:  Diagnosis Date  . Alcoholism ([place])   . Anxiety   . Atypical chest pain   . Bipolar 1 disorder ([place])   . Depression   . Depression   . Diabetes ([place])   . History of chicken pox   . History of kidney stones   . Hyperlipemia    no current med.  . IBS (irritable bowel syndrome)   . Lipoma of back 05/2012   right  . Seasonal allergies   . Wears partial dentures    upper    Past Surgical History:  Procedure Laterality Date  . LIPOMA EXCISION  06/10/2012   Procedure: EXCISION LIPOMA;  Surgeon: Ralene Ok, MD;  Location: [facility];  Service: General;  Laterality: Right;  excision of back lipoma on right 3x3cm   Family History:  Family History  Problem Relation Age of Onset  . Stroke Mother   . Hypertension Mother   . Diabetes Mother   . Hyperlipidemia Mother   .  Hypertension Father   . Diabetes Father   . Schizophrenia Father   . Hypertension Sister   . Hypertension Brother   . Hyperlipidemia Brother   . Cancer Paternal Uncle        Prostate Cancer  . Stroke Maternal Grandmother   . Heart attack Maternal Grandmother   . Diabetes Paternal Grandmother   . High Cholesterol Paternal Grandmother   . Hypertension Brother   . Hyperlipidemia Brother   . Cancer Sister   . High blood pressure Sister   . Hyperlipidemia Sister     Family Psychiatric  History: See H&P  Social History:  Social History   Substance and Sexual Activity  Alcohol Use Yes  . Alcohol/week: 0.0 standard drinks   Comment: Occasional use     Social History   Substance and Sexual Activity  Drug Use Yes   Comment: states daily THC    Social History   Socioeconomic History  . Marital status: Married    Spouse name: Chrystal  . Number of children: 1  . Years of education: 60  . Highest education level: Associate degree: academic program  Occupational History  . Occupation: disabled  Social Needs  . Financial resource strain: Not on file  . Food insecurity:    Worry: Not on file    Inability: Not on file  . Transportation needs:    Medical: Not on file    Non-medical: Not on file  Tobacco Use  . Smoking status: Never Smoker  . Smokeless tobacco: Never Used  Substance and Sexual Activity  . Alcohol use: Yes    Alcohol/week: 0.0 standard drinks    Comment: Occasional use  . Drug use: Yes    Comment: states daily THC  . Sexual activity: Yes    Partners: Female    Birth control/protection: None  Lifestyle  . Physical activity:    Days per week: Not on file    Minutes per session: Not on file  . Stress: Not on file  Relationships  . Social connections:    Talks on phone: Not on file    Gets together: Not on file    Attends religious service: Not on file    Active member of club or organization: Not on file    Attends meetings of clubs or organizations: Not on file    Relationship status: Not on file  Other Topics Concern  . Not on file  Social History Narrative   Patient is right-handed. He lives with his wife and small son in a 2 story house. He only occasionally drinks coffee, tea or soda. He is active, though does not exercise.   Additional Social History:    Pain Medications: see MAR Prescriptions: see MAR Over the Counter: see MAR History of alcohol / drug use?: Yes Longest period of sobriety (when/how  long): unsure Negative Consequences of Use: Personal relationships, Work / Youth worker Name of Substance 1: marijuana 1 - Age of First Use: 22 1 - Amount (size/oz): 1 blunt 1 - Frequency: 1x week 1 - Last Use / Amount: yesterday, 1 blunt Name of Substance 2: alcohol 2 - Age of First Use: 21 2 - Amount (size/oz): 1 beer 2 - Frequency: 1-2x month 2 - Last Use / Amount: 9/8                Sleep: Poor  Appetite:  Good  Current Medications: Current Facility-Administered Medications  Medication Dose Route Frequency Provider Last Rate Last Dose  . acetaminophen (  TYLENOL) tablet 650 mg  650 mg Oral Q6H PRN Ethelene Hal, NP   650 mg at 03/25/18 1018  . alum & mag hydroxide-simeth (MAALOX/MYLANTA) 200-200-20 MG/5ML suspension 30 mL  30 mL Oral Q4H PRN Ethelene Hal, NP      . ARIPiprazole (ABILIFY) tablet 10 mg  10 mg Oral Daily Pennelope Bracken, MD   10 mg at 03/26/18 0810  . benztropine (COGENTIN) tablet 1 mg  1 mg Oral Q12H PRN Pennelope Bracken, MD      . guaiFENesin (MUCINEX) 12 hr tablet 600 mg  600 mg Oral BID Ethelene Hal, NP   600 mg at 03/26/18 0810  . hydrOXYzine (ATARAX/VISTARIL) tablet 50 mg  50 mg Oral Q6H PRN Pennelope Bracken, MD   50 mg at 03/25/18 2336  . Influenza vac split quadrivalent PF (FLUARIX) injection 0.5 mL  0.5 mL Intramuscular Tomorrow-1000 Maris Berger T, MD      . magnesium hydroxide (MILK OF MAGNESIA) suspension 30 mL  30 mL Oral Daily PRN Ethelene Hal, NP      . ondansetron (ZOFRAN-ODT) disintegrating tablet 4 mg  4 mg Oral Q8H PRN Lindon Romp A, NP   4 mg at 03/25/18 2336  . traZODone (DESYREL) tablet 50 mg  50 mg Oral QHS PRN Ethelene Hal, NP   50 mg at 03/25/18 2336  . ziprasidone (GEODON) injection 20 mg  20 mg Intramuscular Q12H PRN Ethelene Hal, NP        Lab Results: No results found for this or any previous visit (from the past 56 hour(s)).  Blood Alcohol level:   Lab Results  Component Value Date   ETH <10 03/24/2018   ETH <11 77/41/2878    Metabolic Disorder Labs: Lab Results  Component Value Date   HGBA1C 5.9 (H) 06/04/2017   MPG 117 (H) 06/22/2014   No results found for: PROLACTIN Lab Results  Component Value Date   CHOL 197 02/22/2016   TRIG 160.0 (H) 02/22/2016   HDL 45.00 02/22/2016   CHOLHDL 4 02/22/2016   VLDL 32.0 02/22/2016   LDLCALC 120 (H) 02/22/2016   LDLCALC 159 (H) 01/27/2015    Physical Findings: AIMS: Facial and Oral Movements Muscles of Facial Expression: None, normal Lips and Perioral Area: None, normal Jaw: None, normal Tongue: None, normal,Extremity Movements Upper (arms, wrists, hands, fingers): None, normal Lower (legs, knees, ankles, toes): None, normal, Trunk Movements Neck, shoulders, hips: None, normal, Overall Severity Severity of abnormal movements (highest score from questions above): None, normal Incapacitation due to abnormal movements: None, normal Patient's awareness of abnormal movements (rate only patient's report): No Awareness, Dental Status Current problems with teeth and/or dentures?: No Does patient usually wear dentures?: No  CIWA:    COWS:     Musculoskeletal: Strength & Muscle Tone: within normal limits Gait & Station: normal Patient leans: N/A  Psychiatric Specialty Exam: Physical Exam  Nursing note and vitals reviewed.   Review of Systems  Constitutional: Negative for chills and fever.  Respiratory: Negative for cough and shortness of breath.   Cardiovascular: Negative for chest pain.  Gastrointestinal: Negative for abdominal pain, heartburn, nausea and vomiting.  Psychiatric/Behavioral: Negative for depression, hallucinations and suicidal ideas. The patient is not nervous/anxious and does not have insomnia.     Blood pressure 110/69, pulse 78, temperature 98.6 F (37 C), temperature source Oral, resp. rate 20, height 6\' 1"  (1.854 m), weight 81.6 kg.Body mass index is  23.75 kg/m.  General  Appearance: Casual and Well Groomed in no apparent distress  Eye Contact:  Good  Speech:  Clear and Coherent and Normal Rate  Volume:  Normal  Mood:  Euthymic  Affect:  Congruent and Constricted  Thought Process:  Coherent, Goal Directed and Linear  Orientation:  Full (Time, Place, and Person)  Thought Content:  Logical  Suicidal Thoughts:  No  Homicidal Thoughts:  No  Memory:  Immediate;   Good Recent;   Good  Judgement:  Good  Insight:  Fair  Psychomotor Activity:  Normal  Concentration:  Concentration: Good and Attention Span: Good  Recall:  Good  Fund of Knowledge:  Good  Language:  Good  Akathisia:  No  Handed:    AIMS (if indicated):     Assets:  Communication Skills Desire for Improvement Housing Social Support  ADL's:  Intact  Cognition:  WNL  Sleep:  Number of Hours: 5.75     Treatment Plan Summary: Daily contact with patient to assess and evaluate symptoms and progress in treatment and Medication management   -Continue inpatient hospitalization.  -Will continue today9/17/2019plan as below except where it is noted.  -Bipolar I, current episode depressed, severe, without psychotic features -Continueabilify 10 mg daily  -Nasal Congestin -Continue Mucinex 600mg  po q12h prn nasal congestion  -EPS -Start cogentin 1mg  po q12h prn EPS  -Nausea/emesis  -Continue Zofran 4 mg po q 88h prn nausea/vomiting  See MAR for agitation protocol  Encourage participation in groups and therapeutic milieu  -disposition planning will be ongoing  Charlott Rakes, Medical Student 03/26/2018, 12:04 PM   -A medical student assisted in the documentation of this patient. I have seen the patient myself, and my assessment and plan is above.

## 2018-03-26 NOTE — Progress Notes (Signed)
Patient states that he slept for three hours today . He states that he is feeling better since he is feeling less angry. His goal for tomorrow is to feel good and to work on his anger.

## 2018-03-26 NOTE — [facility] Group Notes (Signed)
Patient attended orientation and goals group.

## 2018-03-26 NOTE — Progress Notes (Signed)
Recreation Therapy Notes  Date: 9.17.19 Time: 1000 Location: 500 [address] Dayroom  Group Topic: Leisure Education  Goal Area(s) Addresses:  Patient will identify positive leisure activities.  Patient will identify one positive benefit of participation in leisure activities.   Behavioral Response: Engaged  Intervention: [employer], dry erase markers, various words  Activity: Leisure Freight forwarder.  One patient would come to the board and get a slip of paper from the container.  The patient would draw the activity named on the paper onto the white board.  The patient that makes the correct guess, gets the next turn.  SN:  If the group is large enough, the group can be broken up into teams.  Education:  Leisure Education, Dentist  Education Outcome: Acknowledges education/In group clarification offered/Needs additional education  Clinical Observations/Feedback: Pt expressed that leisure was taking time for yourself.  Pt was very interactive during group session.  Pt worked well with his peers.  Pt also cheered on his peers as they drew.    Victorino Sparrow, LRT/CTRS     Victorino Sparrow A 03/26/2018 11:38 AM

## 2018-03-26 NOTE — [facility] Group Notes (Signed)
LCSW Group Therapy Note   03/26/2018 1:15pm   Type of Therapy and Topic:  Group Therapy:  Positive Affirmations   Participation Level:  Active  Description of Group: This group addressed positive affirmation toward self and others. Patients went around the room and identified two positive things about themselves and two positive things about a peer in the room. Patients reflected on how it felt to share something positive with others, to identify positive things about themselves, and to hear positive things from others. Patients were encouraged to have a daily reflection of positive characteristics or circumstances.  Therapeutic Goals 1. Patient will verbalize two of their positive qualities 2. Patient will demonstrate empathy for others by stating two positive qualities about a peer in the group 3. Patient will verbalize their feelings when voicing positive self affirmations and when voicing positive affirmations of others 4. Patients will discuss the potential positive impact on their wellness/recovery of focusing on positive traits of self and others. Summary of Patient Progress:  Stayed the entire time, engaged throughout.  "I don't want to rush this time here.  I want to make sure I'm doing OK.  I am benefiting from hearing other people's stories."  Went on to talk about how he was able to make it through college with the support and challenge of his wife, and how much that means to him.  "It was the hardest thing I have ever done, and I could not have done it without her help."  Therapeutic Modalities Cognitive [name], LCSW 03/26/2018 3:00 PM

## 2018-03-27 NOTE — Progress Notes (Signed)
[facility] MD Progress Note  03/27/2018 9:16 AM [name] EMBLETON  MRN:  [id number]   Subjective:  Curtis Townsend is a 42 y/o male with history oftreatment for bipolar disorder type 1 and PTSDwho [facility] initiated by policewith suicidal ideation and threatening self harm. He states that he and his wife got into a fight about her recent hospitalization. He got every angry at her and grabbed a knife which he held up to his throat and threatened to kill himself. At that point, his daughter called the police and he was taken to the ED.He was given Geodon in the ED due to continued agitation. He was deemed medically stable in the ED and was recommended for inpatient psychiatric care. During this admission, his home fluoxetine and depakote was discontinued, and he was started on a trial of Abilify 10mg  po qDay.  Today he states his mood is good, but he is still feeling physically ill due to nausea. He continues to have emesis that he attributes to his head cold. His sleep has been interrupted by abdominal pain and bouts of nausea. He took one dose of congentin yesterday, which he said helped. He denies SI/HI/AH/VH. He is overall happy with his current medications and feels ready to go home as soon as tomorrow. He had no further questions, comments, or concerns.  Principal Problem: Bipolar I disorder, most recent episode depressed, severe without psychotic features ([place]) Diagnosis:   Patient Active Problem List   Diagnosis Date Noted  . Schizoaffective disorder, mixed type ([place]) [F25.0] 03/24/2018  . Bipolar I disorder, most recent episode depressed, severe without psychotic features ([place]) [F31.4] 03/24/2018  . Atypical chest pain [R07.89] 02/06/2018  . Pure hypercholesterolemia [E78.00] 03/14/2017  . Pure hyperglyceridemia [E78.1] 03/14/2017  . Family history of colon cancer in father [Z80.0] 03/14/2017  . B12 deficiency [E53.8] 06/28/2016  . Vitamin B12 deficiency neuropathy ([place])  [E53.8, G63] 06/08/2016  . Routine general medical examination at a health care facility [Z00.00] 01/27/2015  . Bipolar I disorder ([place]) [F31.9] 12/29/2013  . Neurosis, posttraumatic [F43.10] 12/29/2013  . Bipolar affective disorder, depressed ([place]) [F31.30] 08/04/2013   Total Time spent with patient: 30 minutes  Past Psychiatric History: See H&P  Past Medical History:  Past Medical History:  Diagnosis Date  . Alcoholism ([place])   . Anxiety   . Atypical chest pain   . Bipolar 1 disorder ([place])   . Depression   . Depression   . Diabetes ([place])   . History of chicken pox   . History of kidney stones   . Hyperlipemia    no current med.  . IBS (irritable bowel syndrome)   . Lipoma of back 05/2012   right  . Seasonal allergies   . Wears partial dentures    upper    Past Surgical History:  Procedure Laterality Date  . LIPOMA EXCISION  06/10/2012   Procedure: EXCISION LIPOMA;  Surgeon: Ralene Ok, MD;  Location: [facility];  Service: General;  Laterality: Right;  excision of back lipoma on right 3x3cm   Family History:  Family History  Problem Relation Age of Onset  . Stroke Mother   . Hypertension Mother   . Diabetes Mother   . Hyperlipidemia Mother   . Hypertension Father   . Diabetes Father   . Schizophrenia Father   . Hypertension Sister   . Hypertension Brother   . Hyperlipidemia Brother   . Cancer Paternal Uncle  Prostate Cancer  . Stroke Maternal Grandmother   . Heart attack Maternal Grandmother   . Diabetes Paternal Grandmother   . High Cholesterol Paternal Grandmother   . Hypertension Brother   . Hyperlipidemia Brother   . Cancer Sister   . High blood pressure Sister   . Hyperlipidemia Sister    Family Psychiatric  History: See H&P  Social History:  Social History   Substance and Sexual Activity  Alcohol Use Yes  . Alcohol/week: 0.0 standard drinks   Comment: Occasional use     Social History   Substance and Sexual  Activity  Drug Use Yes   Comment: states daily THC    Social History   Socioeconomic History  . Marital status: Married    Spouse name: Chrystal  . Number of children: 1  . Years of education: 51  . Highest education level: Associate degree: academic program  Occupational History  . Occupation: disabled  Social Needs  . Financial resource strain: Not on file  . Food insecurity:    Worry: Not on file    Inability: Not on file  . Transportation needs:    Medical: Not on file    Non-medical: Not on file  Tobacco Use  . Smoking status: Never Smoker  . Smokeless tobacco: Never Used  Substance and Sexual Activity  . Alcohol use: Yes    Alcohol/week: 0.0 standard drinks    Comment: Occasional use  . Drug use: Yes    Comment: states daily THC  . Sexual activity: Yes    Partners: Female    Birth control/protection: None  Lifestyle  . Physical activity:    Days per week: Not on file    Minutes per session: Not on file  . Stress: Not on file  Relationships  . Social connections:    Talks on phone: Not on file    Gets together: Not on file    Attends religious service: Not on file    Active member of club or organization: Not on file    Attends meetings of clubs or organizations: Not on file    Relationship status: Not on file  Other Topics Concern  . Not on file  Social History Narrative   Patient is right-handed. He lives with his wife and small son in a 2 story house. He only occasionally drinks coffee, tea or soda. He is active, though does not exercise.   Additional Social History:    Pain Medications: see MAR Prescriptions: see MAR Over the Counter: see MAR History of alcohol / drug use?: Yes Longest period of sobriety (when/how long): unsure Negative Consequences of Use: Personal relationships, Work / Youth worker Name of Substance 1: marijuana 1 - Age of First Use: 22 1 - Amount (size/oz): 1 blunt 1 - Frequency: 1x week 1 - Last Use / Amount: yesterday, 1  blunt Name of Substance 2: alcohol 2 - Age of First Use: 21 2 - Amount (size/oz): 1 beer 2 - Frequency: 1-2x month 2 - Last Use / Amount: 9/8                Sleep: Poor  Appetite:  Good  Current Medications: Current Facility-Administered Medications  Medication Dose Route Frequency Provider Last Rate Last Dose  . acetaminophen (TYLENOL) tablet 650 mg  650 mg Oral Q6H PRN Ethelene Hal, NP   650 mg at 03/25/18 1018  . alum & mag hydroxide-simeth (MAALOX/MYLANTA) 200-200-20 MG/5ML suspension 30 mL  30 mL Oral Q4H PRN Romilda Garret,  Billey Chang, NP      . ARIPiprazole (ABILIFY) tablet 10 mg  10 mg Oral Daily Pennelope Bracken, MD   10 mg at 03/27/18 0728  . benztropine (COGENTIN) tablet 1 mg  1 mg Oral Q12H PRN Pennelope Bracken, MD      . guaiFENesin (MUCINEX) 12 hr tablet 600 mg  600 mg Oral BID Ethelene Hal, NP   600 mg at 03/27/18 5277  . hydrOXYzine (ATARAX/VISTARIL) tablet 50 mg  50 mg Oral Q6H PRN Pennelope Bracken, MD   50 mg at 03/25/18 2336  . Influenza vac split quadrivalent PF (FLUARIX) injection 0.5 mL  0.5 mL Intramuscular Tomorrow-1000 Maris Berger T, MD      . magnesium hydroxide (MILK OF MAGNESIA) suspension 30 mL  30 mL Oral Daily PRN Ethelene Hal, NP      . ondansetron (ZOFRAN-ODT) disintegrating tablet 4 mg  4 mg Oral Q8H PRN Lindon Romp A, NP   4 mg at 03/27/18 0639  . traZODone (DESYREL) tablet 50 mg  50 mg Oral QHS PRN Ethelene Hal, NP   50 mg at 03/25/18 2336  . ziprasidone (GEODON) injection 20 mg  20 mg Intramuscular Q12H PRN Ethelene Hal, NP        Lab Results: No results found for this or any previous visit (from the past 70 hour(s)).  Blood Alcohol level:  Lab Results  Component Value Date   ETH <10 03/24/2018   ETH <11 82/42/3536    Metabolic Disorder Labs: Lab Results  Component Value Date   HGBA1C 5.9 (H) 06/04/2017   MPG 117 (H) 06/22/2014   No results found for:  PROLACTIN Lab Results  Component Value Date   CHOL 197 02/22/2016   TRIG 160.0 (H) 02/22/2016   HDL 45.00 02/22/2016   CHOLHDL 4 02/22/2016   VLDL 32.0 02/22/2016   LDLCALC 120 (H) 02/22/2016   LDLCALC 159 (H) 01/27/2015    Physical Findings: AIMS: Facial and Oral Movements Muscles of Facial Expression: None, normal Lips and Perioral Area: None, normal Jaw: None, normal Tongue: None, normal,Extremity Movements Upper (arms, wrists, hands, fingers): None, normal Lower (legs, knees, ankles, toes): None, normal, Trunk Movements Neck, shoulders, hips: None, normal, Overall Severity Severity of abnormal movements (highest score from questions above): None, normal Incapacitation due to abnormal movements: None, normal Patient's awareness of abnormal movements (rate only patient's report): No Awareness, Dental Status Current problems with teeth and/or dentures?: No Does patient usually wear dentures?: No  CIWA:    COWS:     Musculoskeletal: Strength & Muscle Tone: within normal limits Gait & Station: normal Patient leans: N/A  Psychiatric Specialty Exam: Physical Exam  Nursing note and vitals reviewed.   Review of Systems  Constitutional: Negative for chills and fever.  Respiratory: Negative for cough and shortness of breath.   Cardiovascular: Negative for chest pain.  Gastrointestinal: Positive for nausea and vomiting. Negative for abdominal pain and heartburn.  Psychiatric/Behavioral: Negative for depression, hallucinations and suicidal ideas. The patient is not nervous/anxious and does not have insomnia.     Blood pressure 109/64, pulse 91, temperature 98.7 F (37.1 C), temperature source Oral, resp. rate 20, height 6\' 1"  (1.443 m), weight 81.6 kg.Body mass index is 23.75 kg/m.  General Appearance: Casual and Well Groomed in no apparent distress  Eye Contact:  Good  Speech:  Clear and Coherent and Normal Rate  Volume:  Normal  Mood:  Euthymic  Affect:  Congruent and  Constricted  Thought Process:  Coherent, Goal Directed and Linear  Orientation:  Full (Time, Place, and Person)  Thought Content:  Logical  Suicidal Thoughts:  No  Homicidal Thoughts:  No  Memory:  Immediate;   Good Recent;   Good  Judgement:  Good  Insight:  Fair  Psychomotor Activity:  Normal  Concentration:  Concentration: Good and Attention Span: Good  Recall:  Good  Fund of Knowledge:  Good  Language:  Good  Akathisia:  No  Handed:    AIMS (if indicated):     Assets:  Communication Skills Desire for Improvement Housing Social Support  ADL's:  Intact  Cognition:  WNL  Sleep:  Number of Hours: 6.5     Treatment Plan Summary: Daily contact with patient to assess and evaluate symptoms and progress in treatment and Medication management   -Continue inpatient hospitalization.  -Will continue today9/18/2019plan as below except where it is noted.  -Bipolar I, current episode depressed, severe, without psychotic features -Continueabilify 10 mg daily  -Nasal Congestin -Continue Mucinex 600mg  po q12h prn nasal congestion  -EPS -Continue cogentin 1mg  po q12h prn EPS  -Nausea/emesis  -Continue Zofran 4 mg po q 88h prn nausea/vomiting  See MAR for agitation protocol  Encourage participation in groups and therapeutic milieu  -disposition planning will be ongoing  -pt agrees to sign in voluntarily.  Charlott Rakes, Medical Student 03/27/2018, 9:16 AM   The medical student above assisted in the documentation of this note. I evaluated the patient myself and developed the current assessment and treatment plan above.

## 2018-03-27 NOTE — Progress Notes (Signed)
D:  Curtis Townsend has been up and visible on the unit.  He denied SI/HI or A/V hallucination.  He attended evening wrap up group.  He stated he had a good day and is possibly going to go home tomorrow.  "I need to get home because I have some things to do and my son misses me."  He continued to report difficulty with nausea and vomiting after eating.  His wife reminded him that he has acid reflux when drinking sodas, "I am going to stop drinking sodas to see if it will get better."  He denied any pain and appeared to be in no physical distress.  He declined needing anything for sleep tonight but encouraged him to seek out staff if needed. A:  1:1 with RN for support and encouragement.  Medications as ordered.  Q 15 minute checks maintained for safety.  Encouraged participation in group and unit activities.   R:  Curtis Townsend remains safe on the unit.  We will continue to monitor the progress towards his goals.

## 2018-03-27 NOTE — Progress Notes (Signed)
D:  Curtis Townsend has been up and visible on the unit.  He attended evening wrap up group.  He denied SI/HI or A/V hallucinations.  He denied any nausea today but did state that "I didn't try to eat too much, trying to take it easy."  He denied any pain or discomfort and appeared to be in no physical distress.  He declined taking medication to help him sleep but did encourage him to seek out staff if needed.  He is currently resting with his eyes closed and appeared to be asleep. A:  1:1 with RN for support and encouragement.  Medications as ordered.  Q 15 minute checks maintained for safety.  Encouraged participation in group and unit activities.   R:  Tyke remains safe on the unit.  We will continue to monitor the progress towards his goals.

## 2018-03-27 NOTE — [facility] Group Notes (Signed)
LCSW Group Therapy Note  03/27/2018 1:15pm  Type of Therapy and Topic:  Group Therapy:  Feelings around Relapse and Recovery  Participation Level:  Active   Description of Group:    Patients in this group will discuss emotions they experience before and after a relapse. They will process how experiencing these feelings, or avoidance of experiencing them, relates to having a relapse. Facilitator will guide patients to explore emotions they have related to recovery. Patients will be encouraged to process which emotions are more powerful. They will be guided to discuss the emotional reaction significant others in their lives may have to their relapse or recovery. Patients will be assisted in exploring ways to respond to the emotions of others without this contributing to a relapse.  Therapeutic Goals: 1. Patient will identify two or more emotions that lead to a relapse for them 2. Patient will identify two emotions that result when they relapse 3. Patient will identify two emotions related to recovery 4. Patient will demonstrate ability to communicate their needs through discussion and/or role plays   Summary of Patient Progress:  Jantzen attended session. Relapse is an angry outburst leading to depression. He reported that it was important to have honest feedback from trusted friends.  Therapeutic Modalities:   Cognitive Behavioral Therapy Solution-Focused Therapy Assertiveness Training Relapse Prevention Therapy   [name], LCSW 03/27/2018 1:18 PM

## 2018-03-27 NOTE — Progress Notes (Signed)
Adult Psychoeducational Group Note  Date:  03/27/2018 Time:  9:18 PM  Group Topic/Focus:  Wrap-Up Group:   The focus of this group is to help patients review their daily goal of treatment and discuss progress on daily workbooks.  Participation Level:  Active  Participation Quality:  Appropriate  Affect:  Appropriate  Cognitive:  Appropriate  Insight: Appropriate  Engagement in Group:  Engaged  Modes of Intervention:  Discussion  Additional Comments:  Patient attended group and said that his day was a 9.5. His coping skills for today were socializing and playing basketball .   Annaelle Kasel W Kenyah Luba 7/00/1749, 9:18 PM

## 2018-03-27 NOTE — Plan of Care (Signed)
  Problem: Coping: Goal: Ability to verbalize frustrations and anger appropriately will improve Outcome: Progressing Note:  Pt reported during group that he is able to handle his anger better.

## 2018-03-27 NOTE — Progress Notes (Signed)
Recreation Therapy Notes  Date: 9.16.19 Time: 1000 Location: 500 Hall Dayroom  Group Topic: Coping Skills  Goal Area(s) Addresses:  Patient will be able to identify positive coping skills. Patient will be able to identify benefits of using coping skills post d/c.  Intervention: Worksheet, pencils  Activity: Building surveyor.  Patients were to identify the things, people, situations they feel are holding them back and write them inside the web.  Patients were to then identify their coping skills and place them on the outside of the web.  Education: Radiographer, therapeutic, Dentist.   Education Outcome: Acknowledges understanding/In group clarification offered/Needs additional education.   Clinical Observations/Feedback: Pt did not attend group.    Victorino Sparrow, LRT/CTRS     Victorino Sparrow A 03/27/2018 12:13 PM

## 2018-03-27 NOTE — Plan of Care (Signed)
  Problem: Health Behavior/Discharge Planning: Goal: Compliance with therapeutic regimen will improve Outcome: Progressing Note:  He has been attending groups with good participation

## 2018-03-27 NOTE — Plan of Care (Signed)
  Problem: Self-Concept: Goal: Level of anxiety will decrease Outcome: Progressing   Problem: Safety: Goal: Periods of time without injury will increase Outcome: Progressing   Problem: Medication: Goal: Compliance with prescribed medication regimen will improve Outcome: Progressing  DAR NOTE: Patient presents with flat affect and depressed mood.  Denies suicidal thoughts, pain, auditory and visual hallucinations.  Described energy level as normal and concentration as good.  Rates depression at 1, hopelessness at 0, and anxiety at 0.  Maintained on routine safety checks.  Medications given as prescribed.  Support and encouragement offered as needed.  Attended group and participated.  States goal for today is "to remain calm."  Patient observed socializing with peers in the dayroom.  Patient is safe on and off the unit.  Offered no complaint.

## 2018-03-27 NOTE — [facility] Suicide Risk Assessment (Signed)
Curtis Townsend INPATIENT:  Family/Significant Other Suicide Prevention Education  Suicide Prevention Education:  Education Completed; Curtis Townsend, wife, [phone]  has been identified by the patient as the family member/significant other with whom the patient will be residing, and identified as the person(s) who will aid the patient in the event of a mental health crisis (suicidal ideations/suicide attempt).  With written consent from the patient, the family member/significant other has been provided the following suicide prevention education, prior to the and/or following the discharge of the patient.  The suicide prevention education provided includes the following:  Suicide risk factors  Suicide prevention and interventions  National Suicide Hotline telephone number  [facility] assessment telephone number  [facility] Emergency Assistance [place] and/or Residential Mobile Crisis Unit telephone number  Request made of family/significant other to:  Remove weapons (e.g., guns, rifles, knives), all items previously/currently identified as safety concern.    Remove drugs/medications (over-the-counter, prescriptions, illicit drugs), all items previously/currently identified as a safety concern.  The family member/significant other verbalizes understanding of the suicide prevention education information provided.  The family member/significant other agrees to remove the items of safety concern listed above.  [name] 03/27/2018, 10:50 AM

## 2018-03-28 ENCOUNTER — Telehealth: Payer: Self-pay

## 2018-03-28 MED ORDER — TRAZODONE HCL 50 MG PO TABS: 50.0000 mg | ORAL_TABLET | Freq: Every evening | ORAL | 0 refills | Status: DC | PRN

## 2018-03-28 MED ORDER — ARIPIPRAZOLE 10 MG PO TABS: 10.0000 mg | ORAL_TABLET | Freq: Every day | ORAL | 0 refills | Status: DC

## 2018-03-28 MED ORDER — BENZTROPINE MESYLATE 1 MG PO TABS: 1.0000 mg | ORAL_TABLET | Freq: Two times a day (BID) | ORAL | 0 refills | Status: DC | PRN

## 2018-03-28 MED ORDER — HYDROXYZINE HCL 50 MG PO TABS: 50.0000 mg | ORAL_TABLET | Freq: Four times a day (QID) | ORAL | 0 refills | Status: DC | PRN

## 2018-03-28 NOTE — Plan of Care (Signed)
Pt was able to identify effective ways to deal with emotions when angry by completion of recreation therapy group sessions.   Victorino Sparrow, LRT/CTRS

## 2018-03-28 NOTE — Progress Notes (Signed)
Pt discharged to lobby. Pt was stable and appreciative at that time. All papers and prescriptions were given and valuables returned. Verbal understanding expressed. Denies SI/HI and A/VH. Pt given opportunity to express concerns and ask questions.  

## 2018-03-28 NOTE — Progress Notes (Signed)
Recreation Therapy Notes  INPATIENT RECREATION TR PLAN  Patient Details Name: Curtis Townsend MRN: [id number] DOB: 10-13-1975 Today's Date: 03/28/2018  Rec Therapy Plan Is patient appropriate for Therapeutic Recreation?: Yes Treatment times per week: about 3 days Estimated Length of Stay: 5-7 days TR Treatment/Interventions: Group participation (Comment)  Discharge Criteria Pt will be discharged from therapy if:: Discharged Treatment plan/goals/alternatives discussed and agreed upon by:: Patient/family  Discharge Summary Short term goals set: See patient care plan Short term goals met: Adequate for discharge Progress toward goals comments: Groups attended Which groups?: Wellness, Leisure education Reason goals not met: None Therapeutic equipment acquired: N/A Reason patient discharged from therapy: Discharge from hospital Pt/family agrees with progress & goals achieved: Yes Date patient discharged from therapy: 03/28/18    Victorino Sparrow, LRT/CTRS  Ria Comment, Shakeya Kerkman A 03/28/2018, 11:12 AM

## 2018-03-28 NOTE — Discharge Summary (Addendum)
Physician Discharge Summary Note  Patient:  Curtis Townsend is an 42 y.o., male  MRN:  [id number]  DOB:  04/22/1976  Patient phone:  [phone] (home)   Patient address:   [place] [address],  Total Time spent with patient: Greater than 30 minutes  Date of Admission:  03/24/2018  Date of Discharge: 03-28-18  Reason for Admission: Suicidal ideations & suicidal threats.  Principal Problem: Bipolar I disorder, most recent episode depressed, severe without psychotic features [facility])  Discharge Diagnoses: Patient Active Problem List   Diagnosis Date Noted  . Schizoaffective disorder, mixed type ([place]) [F25.0] 03/24/2018  . Bipolar I disorder, most recent episode depressed, severe without psychotic features ([place]) [F31.4] 03/24/2018  . Atypical chest pain [R07.89] 02/06/2018  . Pure hypercholesterolemia [E78.00] 03/14/2017  . Pure hyperglyceridemia [E78.1] 03/14/2017  . Family history of colon cancer in father [Z80.0] 03/14/2017  . B12 deficiency [E53.8] 06/28/2016  . Vitamin B12 deficiency neuropathy ([place]) [E53.8, G63] 06/08/2016  . Routine general medical examination at a health care facility [Z00.00] 01/27/2015  . Bipolar I disorder ([place]) [F31.9] 12/29/2013  . Neurosis, posttraumatic [F43.10] 12/29/2013  . Bipolar affective disorder, depressed ([place]) [F31.30] 08/04/2013   Past Psychiatric History: Bipolar 1 disorder  Past Medical History:  Past Medical History:  Diagnosis Date  . Alcoholism ([place])   . Anxiety   . Atypical chest pain   . Bipolar 1 disorder ([place])   . Depression   . Depression   . Diabetes ([place])   . History of chicken pox   . History of kidney stones   . Hyperlipemia    no current med.  . IBS (irritable bowel syndrome)   . Lipoma of back 05/2012   right  . Seasonal allergies   . Wears partial dentures    upper    Past Surgical History:  Procedure Laterality Date  . LIPOMA EXCISION  06/10/2012   Procedure: EXCISION LIPOMA;  Surgeon:  Ralene Ok, MD;  Location: [place];  Service: General;  Laterality: Right;  excision of back lipoma on right 3x3cm   Family History:  Family History  Problem Relation Age of Onset  . Stroke Mother   . Hypertension Mother   . Diabetes Mother   . Hyperlipidemia Mother   . Hypertension Father   . Diabetes Father   . Schizophrenia Father   . Hypertension Sister   . Hypertension Brother   . Hyperlipidemia Brother   . Cancer Paternal Uncle        Prostate Cancer  . Stroke Maternal Grandmother   . Heart attack Maternal Grandmother   . Diabetes Paternal Grandmother   . High Cholesterol Paternal Grandmother   . Hypertension Brother   . Hyperlipidemia Brother   . Cancer Sister   . High blood pressure Sister   . Hyperlipidemia Sister    Family Psychiatric  History: See H&P Social History:  Social History   Substance and Sexual Activity  Alcohol Use Yes  . Alcohol/week: 0.0 standard drinks   Comment: Occasional use     Social History   Substance and Sexual Activity  Drug Use Yes   Comment: states daily THC    Social History   Socioeconomic History  . Marital status: Married    Spouse name: Chrystal  . Number of children: 1  . Years of education: 2  . Highest education level: Associate degree: academic program  Occupational History  . Occupation: disabled  Social Needs  . Emergency planning/management officer  strain: Not on file  . Food insecurity:    Worry: Not on file    Inability: Not on file  . Transportation needs:    Medical: Not on file    Non-medical: Not on file  Tobacco Use  . Smoking status: Never Smoker  . Smokeless tobacco: Never Used  Substance and Sexual Activity  . Alcohol use: Yes    Alcohol/week: 0.0 standard drinks    Comment: Occasional use  . Drug use: Yes    Comment: states daily THC  . Sexual activity: Yes    Partners: Female    Birth control/protection: None  Lifestyle  . Physical activity:    Days per week: Not on file     Minutes per session: Not on file  . Stress: Not on file  Relationships  . Social connections:    Talks on phone: Not on file    Gets together: Not on file    Attends religious service: Not on file    Active member of club or organization: Not on file    Attends meetings of clubs or organizations: Not on file    Relationship status: Not on file  Other Topics Concern  . Not on file  Social History Narrative   Patient is right-handed. He lives with his wife and small son in a 2 story house. He only occasionally drinks coffee, tea or soda. He is active, though does not exercise.   Hospital Course: (Per Md's discharge SRA): Curtis Townsend is a 80 y/omalewith history oftreatment for bipolar disorder type 1 and PTSDwho [facility] suicidal ideation and threatening self harm. He states that he and his wife got into a fight about her recent hospitalization. He got every angry at her and grabbed a knife which he held up to his throat and threatened to kill himself. At that point, his daughter called the police and he was taken to the ED.He was given Geodon in the ED due to continued agitation.Hewas deemed medically stable in the ED and was recommended for inpatient psychiatric care. During this admission, his home fluoxetine and depakote was discontinued, and he was started on a trial of Abilify 10mg  po qDay. He reported incremental improvement of his presenting symptoms.  Today upon evaluation, pt shares, "I'm doing great." He reports that his mood is significantly improved and he does not have any feeling of cognitive dulling which he associated with his previous medication regimen. He denies physical concerns today, and he reports that all nausea and emesis has completely resolved. He denies other physical complaints. He is sleeping well. His appetite is good. He denies SI/HI/AH/VH. He is tolerating his current regimen well and he is in agreement to  continue his current regimen without changes. He was able to engage in safety planning including plan to return to [facility] or contact emergency services if he feels unable to maintain his own safety or the safety of others. Pt had no further questions, comments, or concerns.  Plan Of Care/Follow-up recommendations:   -Discharge to outpatient level of care  -Bipolar I, current episode depressed, severe, without psychotic features -Continueabilify 10 mg daily  -Nasal Congestin -ContinueMucinex 600mg  po q12h prn nasal congestion  -EPS -Continuecogentin 1mg  po q12h prn EPS  Activity:  as tolerated Diet:  normal Tests:  NA Other:  see above for DC plan  Physical Findings: AIMS: Facial and Oral Movements Muscles of Facial Expression: None, normal Lips and Perioral Area: None, normal Jaw:  None, normal Tongue: None, normal,Extremity Movements Upper (arms, wrists, hands, fingers): None, normal Lower (legs, knees, ankles, toes): None, normal, Trunk Movements Neck, shoulders, hips: None, normal, Overall Severity Severity of abnormal movements (highest score from questions above): None, normal Incapacitation due to abnormal movements: None, normal Patient's awareness of abnormal movements (rate only patient's report): No Awareness, Dental Status Current problems with teeth and/or dentures?: No Does patient usually wear dentures?: No  CIWA:    COWS:     Musculoskeletal: Strength & Muscle Tone: within normal limits Gait & Station: normal Patient leans: N/A  Psychiatric Specialty Exam: Physical Exam  Constitutional: He appears well-developed.  HENT:  Head: Normocephalic.  Eyes: Pupils are equal, round, and reactive to light.  Neck: Normal range of motion.  Cardiovascular: Normal rate.  Respiratory: Effort normal.  GI: Soft.  Genitourinary:  Genitourinary Comments: Deferred  Musculoskeletal: Normal range of  motion.  Neurological: He is alert.  Skin: Skin is warm.    Review of Systems  Constitutional: Negative.   HENT: Negative.   Eyes: Negative.   Respiratory: Negative.   Cardiovascular: Negative.   Gastrointestinal: Negative.   Genitourinary: Negative.   Musculoskeletal: Negative.   Skin: Negative.   Neurological: Negative.   Endo/Heme/Allergies: Negative.   Psychiatric/Behavioral: Positive for depression (Stable).    Blood pressure 121/76, pulse 94, temperature 98.4 F (36.9 C), temperature source Oral, resp. rate 20, height 6\' 1"  (1.854 m), weight 81.6 kg.Body mass index is 23.75 kg/m.  See Md's SRA   Have you used any form of tobacco in the last 30 days? (Cigarettes, Smokeless Tobacco, Cigars, and/or Pipes): No  Has this patient used any form of tobacco in the last 30 days? (Cigarettes, Smokeless Tobacco, Cigars, and/or Pipes): N/A  Blood Alcohol level:  Lab Results  Component Value Date   ETH <10 03/24/2018   ETH <11 81/85/6314   Metabolic Disorder Labs:  Lab Results  Component Value Date   HGBA1C 5.9 (H) 06/04/2017   MPG 117 (H) 06/22/2014   No results found for: PROLACTIN Lab Results  Component Value Date   CHOL 197 02/22/2016   TRIG 160.0 (H) 02/22/2016   HDL 45.00 02/22/2016   CHOLHDL 4 02/22/2016   VLDL 32.0 02/22/2016   LDLCALC 120 (H) 02/22/2016   LDLCALC 159 (H) 01/27/2015   See Psychiatric Specialty Exam and Suicide Risk Assessment completed by Attending Physician prior to discharge.  Discharge destination:  Home  Is patient on multiple antipsychotic therapies at discharge:  No   Has Patient had three or more failed trials of antipsychotic monotherapy by history:  No  Recommended Plan for Multiple Antipsychotic Therapies: NA  Allergies as of 03/28/2018   No Known Allergies     Medication List    STOP taking these medications   divalproex 500 MG 24 hr tablet Commonly known as:  DEPAKOTE ER   FLUoxetine 40 MG capsule Commonly known as:   PROZAC   ketorolac 10 MG tablet Commonly known as:  TORADOL   methocarbamol 750 MG tablet Commonly known as:  ROBAXIN   naproxen sodium 220 MG tablet Commonly known as:  ALEVE     TAKE these medications     Indication  ARIPiprazole 10 MG tablet Commonly known as:  ABILIFY Take 1 tablet (10 mg total) by mouth daily. For mood control Start taking on:  03/29/2018  Indication:  Mood control   benztropine 1 MG tablet Commonly known as:  COGENTIN Take 1 tablet (1 mg total) by mouth every 12 (  twelve) hours as needed (EPS).  Indication:  Extrapyramidal Reaction caused by Medications   hydrOXYzine 50 MG tablet Commonly known as:  ATARAX/VISTARIL Take 1 tablet (50 mg total) by mouth every 6 (six) hours as needed for anxiety.  Indication:  Feeling Anxious   traZODone 50 MG tablet Commonly known as:  DESYREL Take 1 tablet (50 mg total) by mouth at bedtime as needed for sleep.  Indication:  Trouble Sleeping      Follow-up [facility] Follow up on 04/01/2018.   Specialty:  Behavioral Health Why:  Monday at 12:30 with Evelena Peat, and then Tuesday October 22 at 10:20 with Dr Adele Schilder.  Make sure to let the office know if it looks like you will run out of meds before your appointment with the Dr Minette Brine information: [place] [id number] [place] [address] [phone]         Follow-up recommendations: Activity:  As tolerated Diet: As recommended by your primary care doctor. Keep all scheduled follow-up appointments as recommended.    Comments: Patient is instructed prior to discharge to: Take all medications as prescribed by his/her mental healthcare provider. Report any adverse effects and or reactions from the medicines to his/her outpatient provider promptly. Patient has been instructed & cautioned: To not engage in alcohol and or illegal drug use while on prescription medicines. In the event of  worsening symptoms, patient is instructed to call the crisis hotline, 911 and or go to the nearest ED for appropriate evaluation and treatment of symptoms. To follow-up with his/her primary care provider for your other medical issues, concerns and or health care needs.   Signed: Lindell Spar, NP, PMHNP, FNP-BC 03/28/2018, 9:57 AM   Patient seen, Suicide Assessment Completed.  Disposition Plan Reviewed

## 2018-03-28 NOTE — Progress Notes (Signed)
  [facility] Adult Case Management Discharge Plan :  Will you be returning to the same living situation after discharge:  Yes,  home At discharge, do you have transportation home?: Yes,  family Do you have the ability to pay for your medications: Yes,  insurance  Release of information consent forms completed and in the chart;  Patient's signature needed at discharge.  Patient to Follow up at: Follow-up Information    BEHAVIORAL HEALTH OUTPATIENT THERAPY [facility] Follow up on 04/01/2018.   Specialty:  Behavioral Health Why:  Monday at 12:30 with Evelena Peat, and then Tuesday October 22 at 10:20 with Dr Adele Schilder.  Make sure to let the office know if it looks like you will run out of meds before your appointment with the Dr Minette Brine information: [place] [id number] [place] [phone]          Next level of care provider has access to [place] and Suicide Prevention discussed: Yes,  yes  Have you used any form of tobacco in the last 30 days? (Cigarettes, Smokeless Tobacco, Cigars, and/or Pipes): No  Has patient been referred to the Quitline?: N/A patient is not a smoker  Patient has been referred for addiction treatment: Elizabeth, LCSW 03/28/2018, 9:45 AM

## 2018-03-28 NOTE — BHH Suicide Risk Assessment (Signed)
[facility] Discharge Suicide Risk Assessment   Principal Problem: Bipolar I disorder, most recent episode depressed, severe without psychotic features [facility]) Discharge Diagnoses:  Patient Active Problem List   Diagnosis Date Noted  . Schizoaffective disorder, mixed type ([place]) [F25.0] 03/24/2018  . Bipolar I disorder, most recent episode depressed, severe without psychotic features ([place]) [F31.4] 03/24/2018  . Atypical chest pain [R07.89] 02/06/2018  . Pure hypercholesterolemia [E78.00] 03/14/2017  . Pure hyperglyceridemia [E78.1] 03/14/2017  . Family history of colon cancer in father [Z80.0] 03/14/2017  . B12 deficiency [E53.8] 06/28/2016  . Vitamin B12 deficiency neuropathy ([place]) [E53.8, G63] 06/08/2016  . Routine general medical examination at a health care facility [Z00.00] 01/27/2015  . Bipolar I disorder ([place]) [F31.9] 12/29/2013  . Neurosis, posttraumatic [F43.10] 12/29/2013  . Bipolar affective disorder, depressed ([place]) [F31.30] 08/04/2013    Total Time spent with patient: 30 minutes  Musculoskeletal: Strength & Muscle Tone: within normal limits Gait & Station: normal Patient leans: N/A  Psychiatric Specialty Exam: Review of Systems  Constitutional: Negative for chills and fever.  Respiratory: Negative for cough and shortness of breath.   Cardiovascular: Negative for chest pain.  Gastrointestinal: Negative for abdominal pain, heartburn, nausea and vomiting.  Psychiatric/Behavioral: Negative for depression, hallucinations and suicidal ideas. The patient is not nervous/anxious and does not have insomnia.     Blood pressure 121/76, pulse 94, temperature 98.4 F (36.9 C), temperature source Oral, resp. rate 20, height 6\' 1"  (1.854 m), weight 81.6 kg.Body mass index is 23.75 kg/m.  General Appearance: Casual and Fairly Groomed  Engineer, water::  Good  Speech:  Clear and Coherent and Normal Rate  Volume:  Normal  Mood:  Euthymic  Affect:  Appropriate and Congruent  Thought Process:   Coherent and Goal Directed  Orientation:  Full (Time, Place, and Person)  Thought Content:  Logical  Suicidal Thoughts:  No  Homicidal Thoughts:  No  Memory:  Immediate;   Fair Recent;   Fair Remote;   Fair  Judgement:  Good  Insight:  Good  Psychomotor Activity:  Normal  Concentration:  Good  Recall:  Good  Fund of Knowledge:Fair  Language: Fair  Akathisia:  No  Handed:    AIMS (if indicated):     Assets:  Resilience Social Support  Sleep:  Number of Hours: 6.75  Cognition: WNL  ADL's:  Intact   Mental Status Per Nursing Assessment::   On Admission:  Suicidal ideation indicated by patient  Demographic Factors:  Male and Low socioeconomic status  Loss Factors: Financial problems/change in socioeconomic status  Historical Factors: Prior suicide attempts and Impulsivity  Risk Reduction Factors:   Responsible for children under 41 years of age, Sense of responsibility to family, Living with another person, especially a relative, Positive social support, Positive therapeutic relationship and Positive coping skills or problem solving skills  Continued Clinical Symptoms:  Severe Anxiety and/or Agitation Depression:   Severe  Cognitive Features That Contribute To Risk:  None    Suicide Risk:  Minimal: No identifiable suicidal ideation.  Patients presenting with no risk factors but with morbid ruminations; may be classified as minimal risk based on the severity of the depressive symptoms  Follow-up [facility] Follow up on 04/01/2018.   Specialty:  Behavioral Health Why:  Monday at 12:30 with Evelena Peat, and then Tuesday October 22 at 10:20 with Dr Adele Schilder.  Make sure to let the office know if it looks like you will run out of meds before  your appointment with the Dr Minette Brine information: [place] [id number] [place] [phone]        Subjective Data:   Curtis Townsend is a  42 y/omalewith history oftreatment for bipolar disorder type 1 and PTSDwho [facility]with suicidal ideation and threatening self harm. He states that he and his wife got into a fight about her recent hospitalization. He got every angry at her and grabbed a knife which he held up to his throat and threatened to kill himself. At that point, his daughter called the police and he was taken to the ED.He was given Geodon in the ED due to continued agitation.Hewas deemed medically stable in the ED and was recommended for inpatient psychiatric care. During this admission, his home fluoxetine and depakote was discontinued, and he was started on a trial of Abilify 10mg  po qDay. He reported incremental improvement of his presenting symptoms.  Today upon evaluation, pt shares, "I'm doing great." He reports that his mood is significantly improved and he does not have any feeling of cognitive dulling which he associated with his previous medication regimen. He denies physical concerns today, and he reports that all nausea and emesis has completely resolved. He denies other physical complaints. He is sleeping well. His appetite is good. He denies SI/HI/AH/VH. He is tolerating his current regimen well and he is in agreement to continue his current regimen without changes. He was able to engage in safety planning including plan to return to [facility] or contact emergency services if he feels unable to maintain his own safety or the safety of others. Pt had no further questions, comments, or concerns.   Plan Of Care/Follow-up recommendations:   -Discharge to outpatient level of care  -Bipolar I, current episode depressed, severe, without psychotic features -Continueabilify 10 mg daily  -Nasal Congestin -ContinueMucinex 600mg  po q12h prn nasal congestion  -EPS -Continue cogentin 1mg  po q12h prn EPS  Activity:   as tolerated Diet:  normal Tests:  NA Other:  see above for [place], MD 03/28/2018, 9:42 AM

## 2018-03-28 NOTE — Telephone Encounter (Signed)
Pt is on TCM list after ED evaluation. Chart states pt has been dc'ed. Called number listed for pt but no answer and no vm to leave a message.

## 2018-04-01 ENCOUNTER — Ambulatory Visit (INDEPENDENT_AMBULATORY_CARE_PROVIDER_SITE_OTHER): Payer: Federal, State, Local not specified - PPO | Admitting: Licensed Clinical Social Worker

## 2018-04-01 ENCOUNTER — Telehealth: Payer: Self-pay | Admitting: Neurology

## 2018-04-01 ENCOUNTER — Encounter (HOSPITAL_COMMUNITY): Payer: Self-pay | Admitting: Licensed Clinical Social Worker

## 2018-04-01 DIAGNOSIS — F4310 Post-traumatic stress disorder, unspecified: Secondary | ICD-10-CM | POA: Diagnosis not present

## 2018-04-01 DIAGNOSIS — F3163 Bipolar disorder, current episode mixed, severe, without psychotic features: Secondary | ICD-10-CM | POA: Diagnosis not present

## 2018-04-01 NOTE — Telephone Encounter (Signed)
Mychart message sent to patient.

## 2018-04-01 NOTE — Progress Notes (Signed)
   THERAPIST PROGRESS NOTE  Session Time: 12:40pm-1:30pm  Participation Level: Active  Behavioral Response: NeatAlertEuthymic  Type of Therapy: Individual Therapy  Treatment Goals addressed: Improve psychiatric symptoms, elevate mood (decreased irritability, increased enjoyment of activities), Improve unhelpful thought patterns, emotional regulation skills (reduce temper outburst), Interpersonal relationship skills  Interventions: Motivational Interviewing and Other: Grounding and Mindfulness techniques  Summary: Curtis Townsend is a 42 y.o. male who presents with PTSD and Bipolar I Disorder, mixed, severe  Suicidal/Homicidal: No without intent/plan  Therapist Response:  [name] met with clinician for individual therapy. [name] discussed his psychiatric symptoms and current life events. [name] shared that he recently was discharged from [facility] after 4 days following his wife's suicide attempt. Clinician explored the trigger point for his IVC. [name] processed his experiences and noted that he had a plan to slit his throat, so his daughter called 911. Clinician processed his experience in [facility] this time and noted the improvement in current mood due to medication change. [name] reports he enjoyed helping others in that setting and was not considering being a peer support until now. Clinician identified the importance of focusing on more positive parts of life, as his traumatic experiences didn't happen on a daily basis. [name] reports he is starting to think about the fun parts of his childhood and try to keep those things in the front of his mind, although there are still trauma reminders present. Clinician utilized CBT to identify thought stopping coping skills.    Plan: Return again in 1-2 weeks.  Diagnosis:     Axis I: PTSD and Bipolar I Disorder, mixed, severe   [name], LCSW 04/01/2018

## 2018-04-01 NOTE — Telephone Encounter (Signed)
-----   Message from Pieter Partridge, DO sent at 03/25/2018  9:47 AM EDT ----- B12 is in the low-normal range which can still cause symptoms of deficiency.  Recommend taking over the counter B12 1000 mcg daily.

## 2018-04-04 ENCOUNTER — Ambulatory Visit (INDEPENDENT_AMBULATORY_CARE_PROVIDER_SITE_OTHER): Payer: Federal, State, Local not specified - PPO

## 2018-04-04 DIAGNOSIS — Z23 Encounter for immunization: Secondary | ICD-10-CM

## 2018-04-15 ENCOUNTER — Ambulatory Visit (INDEPENDENT_AMBULATORY_CARE_PROVIDER_SITE_OTHER): Payer: Federal, State, Local not specified - PPO | Admitting: Licensed Clinical Social Worker

## 2018-04-15 ENCOUNTER — Encounter (HOSPITAL_COMMUNITY): Payer: Self-pay | Admitting: Licensed Clinical Social Worker

## 2018-04-15 DIAGNOSIS — F3163 Bipolar disorder, current episode mixed, severe, without psychotic features: Secondary | ICD-10-CM | POA: Diagnosis not present

## 2018-04-15 DIAGNOSIS — F4310 Post-traumatic stress disorder, unspecified: Secondary | ICD-10-CM

## 2018-04-15 NOTE — Progress Notes (Signed)
   THERAPIST PROGRESS NOTE  Session Time: 11:00am-11:45am  Participation Level: Active  Behavioral Response: NeatAlertEuthymic   Type of Therapy: Individual Therapy  Treatment Goals addressed: Improve psychiatric symptoms, elevate mood (decreased irritability, increased enjoyment of activities), Improve unhelpful thought patterns, emotional regulation skills (reduce temper outburst), Interpersonal relationship skills  Interventions: Motivational Interviewing and Other: Grounding and Mindfulness techniques  Summary: Curtis Townsend is a 42 y.o. male who presents with PTSD and Bipolar I Disorder, mixed, severe  Suicidal/Homicidal: No without intent/plan  Therapist Response:  Curtis Townsend met with clinician for individual therapy. Curtis Townsend discussed his psychiatric symptoms and current life events. Curtis Townsend shared that since his medication change, he has been feeling less angry and more relaxed. He also reports that since he and his wife were both hospitalized, they have come to a better understanding of each other and their relationship has improved. Clinician explored options for couples counseling, but Curtis Townsend reports this is not needed at this time. Clinician discussed other changes in his life and noted a possibility of buying a house. Clinician processed options and completed a "pros and cons" list. Clinician utilized CBT to discuss the importance of having accessible coping skills. Clinician also noted the value of positive thinking.   Plan: Return again in 1-2 weeks.  Diagnosis:     Axis I: PTSD and Bipolar I Disorder, mixed, severe   [name], LCSW 04/15/2018

## 2018-04-29 ENCOUNTER — Ambulatory Visit (INDEPENDENT_AMBULATORY_CARE_PROVIDER_SITE_OTHER): Payer: Federal, State, Local not specified - PPO | Admitting: Licensed Clinical Social Worker

## 2018-04-29 ENCOUNTER — Encounter (HOSPITAL_COMMUNITY): Payer: Self-pay | Admitting: Licensed Clinical Social Worker

## 2018-04-29 DIAGNOSIS — F4310 Post-traumatic stress disorder, unspecified: Secondary | ICD-10-CM | POA: Diagnosis not present

## 2018-04-29 DIAGNOSIS — F3162 Bipolar disorder, current episode mixed, moderate: Secondary | ICD-10-CM | POA: Diagnosis not present
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
## 2018-04-30 ENCOUNTER — Encounter (HOSPITAL_COMMUNITY): Payer: Self-pay | Admitting: Psychiatry

## 2018-04-30 ENCOUNTER — Ambulatory Visit (INDEPENDENT_AMBULATORY_CARE_PROVIDER_SITE_OTHER): Payer: Federal, State, Local not specified - PPO | Admitting: Psychiatry

## 2018-04-30 VITALS — BP 128/72 | HR 70 | Ht 73.0 in | Wt 206.0 lb

## 2018-04-30 DIAGNOSIS — F1210 Cannabis abuse, uncomplicated: Secondary | ICD-10-CM

## 2018-04-30 DIAGNOSIS — F319 Bipolar disorder, unspecified: Secondary | ICD-10-CM

## 2018-04-30 DIAGNOSIS — F4310 Post-traumatic stress disorder, unspecified: Secondary | ICD-10-CM

## 2018-04-30 MED ORDER — ARIPIPRAZOLE 15 MG PO TABS: 15.0000 mg | ORAL_TABLET | Freq: Every day | ORAL | 1 refills | Status: DC

## 2018-04-30 MED ORDER — BENZTROPINE MESYLATE 1 MG PO TABS: 1.0000 mg | ORAL_TABLET | Freq: Every day | ORAL | 1 refills | Status: DC

## 2018-04-30 MED ORDER — TRAZODONE HCL 50 MG PO TABS: 50.0000 mg | ORAL_TABLET | Freq: Every evening | ORAL | 1 refills | Status: DC | PRN

## 2018-04-30 MED ORDER — ARIPIPRAZOLE 10 MG PO TABS: 10.0000 mg | ORAL_TABLET | Freq: Every day | ORAL | 1 refills | Status: DC
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
## 2018-05-13 ENCOUNTER — Encounter (HOSPITAL_COMMUNITY): Payer: Self-pay | Admitting: Licensed Clinical Social Worker

## 2018-05-13 ENCOUNTER — Ambulatory Visit (INDEPENDENT_AMBULATORY_CARE_PROVIDER_SITE_OTHER): Payer: Federal, State, Local not specified - PPO | Admitting: Licensed Clinical Social Worker

## 2018-05-13 DIAGNOSIS — F3160 Bipolar disorder, current episode mixed, unspecified: Secondary | ICD-10-CM

## 2018-05-13 NOTE — Progress Notes (Signed)
   THERAPIST PROGRESS NOTE  Session Time: 12:30pm-1:30pm  Participation Level: Active  Behavioral Response: Well GroomedAlertEuthymic  Type of Therapy: Individual Therapy  Treatment Goals addressed: Improve psychiatric symptoms, elevate mood (decreased irritability, increased enjoyment of activities), Improve unhelpful thought patterns, emotional regulation skills (reduce temper outburst), Interpersonal relationship skills  Interventions: Motivational Interviewing and Other: Grounding and Mindfulness techniques  Summary: Curtis Townsend is a 42 y.o. male who presents with PTSD and Bipolar I Disorder, mixed  Suicidal/Homicidal: No without intent/plan  Therapist Response:  Curtis Townsend met with clinician for individual therapy. Curtis Townsend discussed his psychiatric symptoms and current life events. Curtis Townsend shared that he has been feeling very good since his medication change. He reports feeling more even keeled, less agitated, and more willing to be relaxed. Curtis Townsend also identified no drinking alcohol and no fighting with the neighbors. Clinician explored progress with move and noted slow and steady progress. Clinician discussed relationship with mom and Curtis Townsend's concerns about her living outside of his home. Clinician processed these concerns and provided supports for both him and her, with caregiver support groups and local availability for CNAs.   Plan: Return again in 1-2 weeks.  Diagnosis:     Axis I: PTSD and Bipolar I Disorder, mixed   [name], LCSW 05/13/2018

## 2018-05-28 ENCOUNTER — Ambulatory Visit (HOSPITAL_COMMUNITY): Payer: Self-pay | Admitting: Licensed Clinical Social Worker

## 2018-06-10 ENCOUNTER — Ambulatory Visit (HOSPITAL_COMMUNITY): Payer: Federal, State, Local not specified - PPO | Admitting: Psychiatry

## 2018-06-11 ENCOUNTER — Encounter (HOSPITAL_COMMUNITY): Payer: Self-pay | Admitting: Licensed Clinical Social Worker

## 2018-06-11 ENCOUNTER — Ambulatory Visit (INDEPENDENT_AMBULATORY_CARE_PROVIDER_SITE_OTHER): Payer: Federal, State, Local not specified - PPO | Admitting: Licensed Clinical Social Worker

## 2018-06-11 DIAGNOSIS — F3160 Bipolar disorder, current episode mixed, unspecified: Secondary | ICD-10-CM | POA: Diagnosis not present

## 2018-06-11 DIAGNOSIS — F4310 Post-traumatic stress disorder, unspecified: Secondary | ICD-10-CM | POA: Diagnosis not present

## 2018-06-11 NOTE — Progress Notes (Signed)
   THERAPIST PROGRESS NOTE  Session Time: 1:05pm-1:30pm  Participation Level: Active  Behavioral Response: CasualAlertDepressed  Type of Therapy: Individual Therapy  Treatment Goals addressed: Improve psychiatric symptoms, elevate mood (decreased irritability, increased enjoyment of activities), Improve unhelpful thought patterns, emotional regulation skills (reduce temper outburst), Interpersonal relationship skills  Interventions: Motivational Interviewing and Other: Grounding and Mindfulness techniques  Summary: Curtis Townsend is a 42 y.o. male who presents with PTSD and Bipolar I Disorder, mixed, severe  Suicidal/Homicidal: No without intent/plan  Therapist Response:  Nihar met with clinician for individual therapy. Curtis Townsend discussed his psychiatric symptoms and current life events. Curtis Townsend was 35 minutes late for appointment, but was able to engage for the last half of the session. He reports that adjusting to the new house has been very hard for him. He identified several things he was not prepared for, including the smaller size, darker rooms, and possibility of needing a new water heater. Clinician normalized these homeowner problems and provided practice wisdom from others about home repair woes. Clinician identified that Curtis Townsend was feeling overwhelmed and assisted in preparing a To Do list. Clinician also added helpful coping skills, such as looking at the bright side, smiling, laughing, and breathing.   Plan: Return again in 1-2 weeks.  Diagnosis:     Axis I: PTSD and Bipolar I Disorder, mixed, severe   [name], LCSW 06/11/2018

## 2018-06-24 ENCOUNTER — Ambulatory Visit (INDEPENDENT_AMBULATORY_CARE_PROVIDER_SITE_OTHER): Payer: Federal, State, Local not specified - PPO | Admitting: Licensed Clinical Social Worker

## 2018-06-24 ENCOUNTER — Encounter (HOSPITAL_COMMUNITY): Payer: Self-pay | Admitting: Licensed Clinical Social Worker

## 2018-06-24 DIAGNOSIS — F4310 Post-traumatic stress disorder, unspecified: Secondary | ICD-10-CM

## 2018-06-24 DIAGNOSIS — F3162 Bipolar disorder, current episode mixed, moderate: Secondary | ICD-10-CM
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
## 2018-06-30 ENCOUNTER — Other Ambulatory Visit (HOSPITAL_COMMUNITY): Payer: Self-pay | Admitting: Psychiatry

## 2018-06-30 DIAGNOSIS — F3163 Bipolar disorder, current episode mixed, severe, without psychotic features: Secondary | ICD-10-CM

## 2018-07-08 ENCOUNTER — Ambulatory Visit (INDEPENDENT_AMBULATORY_CARE_PROVIDER_SITE_OTHER): Payer: Federal, State, Local not specified - PPO | Admitting: Licensed Clinical Social Worker

## 2018-07-08 DIAGNOSIS — Z5329 Procedure and treatment not carried out because of patient's decision for other reasons: Secondary | ICD-10-CM

## 2018-07-08 NOTE — Progress Notes (Signed)
Client no showed this appointment

## 2018-07-20 ENCOUNTER — Other Ambulatory Visit (HOSPITAL_COMMUNITY): Payer: Self-pay | Admitting: Psychiatry

## 2018-07-20 DIAGNOSIS — F3163 Bipolar disorder, current episode mixed, severe, without psychotic features: Secondary | ICD-10-CM

## 2018-07-25 ENCOUNTER — Other Ambulatory Visit: Payer: Self-pay

## 2018-07-25 ENCOUNTER — Ambulatory Visit (HOSPITAL_COMMUNITY): Payer: Federal, State, Local not specified - PPO | Admitting: Licensed Clinical Social Worker

## 2018-07-25 ENCOUNTER — Encounter (HOSPITAL_COMMUNITY): Payer: Self-pay | Admitting: *Deleted

## 2018-07-25 ENCOUNTER — Emergency Department (HOSPITAL_COMMUNITY)
Admission: EM | Admit: 2018-07-25 | Discharge: 2018-07-25 | Disposition: A | Discharge status: Home / Self Care | Payer: Federal, State, Local not specified - PPO | Attending: Emergency Medicine | Admitting: Emergency Medicine

## 2018-07-25 ENCOUNTER — Emergency Department (HOSPITAL_COMMUNITY): Payer: Federal, State, Local not specified - PPO

## 2018-07-25 DIAGNOSIS — E119 Type 2 diabetes mellitus without complications: Secondary | ICD-10-CM | POA: Diagnosis not present

## 2018-07-25 DIAGNOSIS — R112 Nausea with vomiting, unspecified: Secondary | ICD-10-CM | POA: Diagnosis not present

## 2018-07-25 DIAGNOSIS — R519 Headache, unspecified: Secondary | ICD-10-CM

## 2018-07-25 DIAGNOSIS — R51 Headache: Secondary | ICD-10-CM | POA: Insufficient documentation

## 2018-07-25 DIAGNOSIS — Z79899 Other long term (current) drug therapy: Secondary | ICD-10-CM | POA: Diagnosis not present

## 2018-07-25 MED ORDER — DIPHENHYDRAMINE HCL 50 MG/ML IJ SOLN
25.0000 mg | Freq: Once | INTRAMUSCULAR | Status: AC
  Administered 2018-07-25: 25 mg via INTRAVENOUS
  Filled 2018-07-25: qty 1

## 2018-07-25 MED ORDER — DEXAMETHASONE SODIUM PHOSPHATE 10 MG/ML IJ SOLN
10.0000 mg | Freq: Once | INTRAMUSCULAR | Status: AC
  Administered 2018-07-25: 10 mg via INTRAVENOUS
  Filled 2018-07-25: qty 1

## 2018-07-25 MED ORDER — SODIUM CHLORIDE 0.9 % IV BOLUS
1000.0000 mL | Freq: Once | INTRAVENOUS | Status: AC
  Administered 2018-07-25: 1000 mL via INTRAVENOUS

## 2018-07-25 MED ORDER — METOCLOPRAMIDE HCL 5 MG/ML IJ SOLN
10.0000 mg | Freq: Once | INTRAMUSCULAR | Status: AC
  Administered 2018-07-25: 10 mg via INTRAVENOUS
  Filled 2018-07-25: qty 2

## 2018-07-25 NOTE — ED Notes (Signed)
Patient transported to CT 

## 2018-07-25 NOTE — ED Provider Notes (Signed)
[facility] DEPT Provider Note   CSN: [id number] Arrival date & time: 07/25/18  3474     History   Chief Complaint Chief Complaint  Patient presents with  . Headache    HPI Curtis Townsend is a 43 y.o. male.  He is presenting to the emergency department with his significant other for evaluation of headache.  His wife is giving most of the history as he is moaning and in the corner of the room.  The headache is frontal and started this morning woke him up.  He has had headaches on and off for 3 weeks.  He tried ibuprofen without any relief.  No history of any trauma.  The history is provided by the patient and the spouse.  Headache  Pain location:  Frontal Quality:  Dull Radiates to:  Does not radiate Severity currently:  10/10 Onset quality:  Sudden Timing:  Constant Progression:  Unchanged Chronicity:  New Similar to prior headaches: yes   Context: bright light and loud noise   Relieved by:  Nothing Worsened by:  Light Ineffective treatments:  NSAIDs Associated symptoms: nausea, photophobia and vomiting   Associated symptoms: no abdominal pain, no fever, no neck pain, no neck stiffness and no sore throat     Past Medical History:  Diagnosis Date  . Alcoholism ([place])   . Anxiety   . Atypical chest pain   . Bipolar 1 disorder ([place])   . Depression   . Depression   . Diabetes ([place])   . History of chicken pox   . History of kidney stones   . Hyperlipemia    no current med.  . IBS (irritable bowel syndrome)   . Lipoma of back 05/2012   right  . Seasonal allergies   . Wears partial dentures    upper    Patient Active Problem List   Diagnosis Date Noted  . Schizoaffective disorder, mixed type ([place]) 03/24/2018  . Bipolar I disorder, most recent episode depressed, severe without psychotic features ([place]) 03/24/2018  . Atypical chest pain 02/06/2018  . Pure hypercholesterolemia 03/14/2017  . Pure hyperglyceridemia 03/14/2017  .  Family history of colon cancer in father 03/14/2017  . B12 deficiency 06/28/2016  . Vitamin B12 deficiency neuropathy ([place]) 06/08/2016  . Routine general medical examination at a health care facility 01/27/2015  . Bipolar I disorder ([place]) 12/29/2013  . Neurosis, posttraumatic 12/29/2013  . Bipolar affective disorder, depressed ([place]) 08/04/2013    Past Surgical History:  Procedure Laterality Date  . LIPOMA EXCISION  06/10/2012   Procedure: EXCISION LIPOMA;  Surgeon: Ralene Ok, MD;  Location: [facility];  Service: General;  Laterality: Right;  excision of back lipoma on right 3x3cm        Home Medications    Prior to Admission medications   Medication Sig Start Date End Date Taking? Authorizing Provider  ARIPiprazole (ABILIFY) 15 MG tablet Take 1 tablet (15 mg total) by mouth daily. Dose increase 04/30/18   Arfeen, Arlyce Harman, MD  benztropine (COGENTIN) 1 MG tablet Take 1 tablet (1 mg total) by mouth daily. 04/30/18   Arfeen, Arlyce Harman, MD  hydrOXYzine (ATARAX/VISTARIL) 50 MG tablet Take 1 tablet (50 mg total) by mouth every 6 (six) hours as needed for anxiety. 03/28/18   Lindell Spar I, NP  traZODone (DESYREL) 50 MG tablet Take 1 tablet (50 mg total) by mouth at bedtime as needed for sleep. 04/30/18   [name], MD    Family  History Family History  Problem Relation Age of Onset  . Stroke Mother   . Hypertension Mother   . Diabetes Mother   . Hyperlipidemia Mother   . Hypertension Father   . Diabetes Father   . Schizophrenia Father   . Hypertension Sister   . Hypertension Brother   . Hyperlipidemia Brother   . Cancer Paternal Uncle        Prostate Cancer  . Stroke Maternal Grandmother   . Heart attack Maternal Grandmother   . Diabetes Paternal Grandmother   . High Cholesterol Paternal Grandmother   . Hypertension Brother   . Hyperlipidemia Brother   . Cancer Sister   . High blood pressure Sister   . Hyperlipidemia Sister     Social History Social  History   Tobacco Use  . Smoking status: Never Smoker  . Smokeless tobacco: Never Used  Substance Use Topics  . Alcohol use: Yes    Alcohol/week: 0.0 standard drinks    Comment: Occasional use  . Drug use: Yes    Comment: states daily THC     Allergies   Patient has no known allergies.   Review of Systems Review of Systems  Constitutional: Negative for fever.  HENT: Negative for sore throat.   Eyes: Positive for photophobia.  Respiratory: Negative for shortness of breath.   Cardiovascular: Negative for chest pain.  Gastrointestinal: Positive for nausea and vomiting. Negative for abdominal pain.  Genitourinary: Negative for dysuria.  Musculoskeletal: Negative for neck pain and neck stiffness.  Skin: Negative for rash.  Neurological: Positive for headaches.     Physical Exam Updated Vital Signs BP 122/82   Pulse 72   Temp 97.8 F (36.6 C) (Oral)   Resp 16   Ht 6\' 1"  (1.854 m)   Wt 99.8 kg   SpO2 97%   BMI 29.03 kg/m   Physical Exam Vitals signs and nursing note reviewed.  Constitutional:      Appearance: He is well-developed.  HENT:     Head: Normocephalic and atraumatic.  Eyes:     Conjunctiva/sclera: Conjunctivae normal.  Neck:     Musculoskeletal: Neck supple.  Cardiovascular:     Rate and Rhythm: Normal rate and regular rhythm.     Heart sounds: No murmur.  Pulmonary:     Effort: Pulmonary effort is normal. No respiratory distress.     Breath sounds: Normal breath sounds.  Abdominal:     Palpations: Abdomen is soft.     Tenderness: There is no abdominal tenderness.  Skin:    General: Skin is warm and dry.     Capillary Refill: Capillary refill takes less than 2 seconds.  Neurological:     Mental Status: He is alert.     GCS: GCS eye subscore is 4. GCS verbal subscore is 5. GCS motor subscore is 6.     Cranial Nerves: No cranial nerve deficit.     Motor: No weakness.     Gait: Gait normal.      ED Treatments / Results  Labs (all labs  ordered are listed, but only abnormal results are displayed) Labs Reviewed - No data to display  EKG None  Radiology Ct Head Wo Contrast  Result Date: 07/25/2018 CLINICAL DATA:  Headache EXAM: CT HEAD WITHOUT CONTRAST TECHNIQUE: Contiguous axial images were obtained from the base of the skull through the vertex without intravenous contrast. COMPARISON:  01/02/2018 FINDINGS: Brain: No acute intracranial abnormality. Specifically, no hemorrhage, hydrocephalus, mass lesion, acute infarction, or significant  intracranial injury. Vascular: No hyperdense vessel or unexpected calcification. Skull: No acute calvarial abnormality. Sinuses/Orbits: No acute findings. Mild mucosal thickening in the paranasal sinuses. Other: None IMPRESSION: No acute intracranial abnormality. Electronically Signed   By: Rolm Baptise M.D.   On: 07/25/2018 08:07    Procedures Procedures (including critical care time)  Medications Ordered in ED Medications  metoCLOPramide (REGLAN) injection 10 mg (10 mg Intravenous Given 07/25/18 0747)  diphenhydrAMINE (BENADRYL) injection 25 mg (25 mg Intravenous Given 07/25/18 0747)  sodium chloride 0.9 % bolus 1,000 mL (0 mLs Intravenous Stopped 07/25/18 1012)  dexamethasone (DECADRON) injection 10 mg (10 mg Intravenous Given 07/25/18 1102)     Initial Impression / Assessment and Plan / ED Course  I have reviewed the triage vital signs and the nursing notes.  Pertinent labs & imaging results that were available during my care of the patient were reviewed by me and considered in my medical decision making (see chart for details).  Clinical Course as of Jul 26 1739  Thu Jul 25, 2018  6712 Patient's headache is improved and he is asking for something to drink.   [MB]  4580 Patient looks a lot more comfortable but he still the same he has moderate headache.  Imaging was negative.  We will give him a dose of Decadron.   [MB]  9983 Patient's headache now resolved.  He is feeling well  enough to go home.  They have a neurologist so recommended that they follow-up with them.   [MB]    Clinical Course User Index [MB] Hayden Rasmussen, MD    Final Clinical Impressions(s) / ED Diagnoses   Final diagnoses:  Generalized headache    ED Discharge Orders    None       Hayden Rasmussen, MD 07/25/18 1742

## 2018-07-25 NOTE — Discharge Instructions (Addendum)
You were seen in the emergency department for worsening headaches over a week.  You had a CAT scan that did not show any obvious findings.  Your symptoms improved after some medications.  Please follow-up with your doctor and your neurologist.  Return if any concerns.

## 2018-07-25 NOTE — ED Triage Notes (Signed)
Pt woke up with a severe headache and is the worse that he has had. Pt has been intermittent headaches for the past 3 weeks that were resolved with OTC medications.  Pt took three Advil with no relief.  Pt endorses n/v and sensitivity to light.

## 2018-08-06 ENCOUNTER — Ambulatory Visit (INDEPENDENT_AMBULATORY_CARE_PROVIDER_SITE_OTHER): Payer: Federal, State, Local not specified - PPO | Admitting: Licensed Clinical Social Worker

## 2018-08-06 ENCOUNTER — Encounter (HOSPITAL_COMMUNITY): Payer: Self-pay | Admitting: Licensed Clinical Social Worker

## 2018-08-06 DIAGNOSIS — F3163 Bipolar disorder, current episode mixed, severe, without psychotic features: Secondary | ICD-10-CM | POA: Diagnosis not present

## 2018-08-06 DIAGNOSIS — F4310 Post-traumatic stress disorder, unspecified: Secondary | ICD-10-CM

## 2018-08-06 NOTE — Progress Notes (Signed)
   THERAPIST PROGRESS NOTE  Session Time: 12:30pm-1:30pm  Participation Level: Active  Behavioral Response: Well GroomedAlertDepressed  Type of Therapy: Individual Therapy  Treatment Goals addressed: Improve psychiatric symptoms, elevate mood (decreased irritability, increased enjoyment of activities), Improve unhelpful thought patterns, emotional regulation skills (reduce temper outburst), Interpersonal relationship skills  Interventions: Motivational Interviewing and Other: Grounding and Mindfulness techniques  Summary: Curtis Townsend is a 43 y.o. male who presents with PTSD and Bipolar I Disorder, mixed, severe  Suicidal/Homicidal: No without intent/plan  Therapist Response:  Curtis Townsend met with clinician for individual therapy. Curtis Townsend discussed his psychiatric symptoms and current life events. Curtis Townsend shared that he has been feeling a little depressed over the past few weeks. Clinician explored triggers and experiences with depression. Clinician noted that lack of involvement with friends or other activities outside of the home. Clinician explored interests and noted lots of experiences with the homeless, being homeless in the last, and wanting to be with people.  Clinician provided resources for volunteering and discussed the benefits of trying something once.   Curtis Townsend identified interest in switching medication to the Abilify Maintena injection, rather than taking the daily pills. Clinician spoke with nurse about options and will follow up with [name] at next availability.   Plan: Return again in 2 weeks.  Diagnosis:     Axis I: PTSD and Bipolar I Disorder, mixed, severe  [name], LCSW 08/06/2018

## 2018-08-13 DIAGNOSIS — K0889 Other specified disorders of teeth and supporting structures: Secondary | ICD-10-CM | POA: Diagnosis not present

## 2018-08-23 ENCOUNTER — Other Ambulatory Visit (HOSPITAL_COMMUNITY): Payer: Self-pay

## 2018-08-23 DIAGNOSIS — F4310 Post-traumatic stress disorder, unspecified: Secondary | ICD-10-CM

## 2018-08-23 MED ORDER — BENZTROPINE MESYLATE 1 MG PO TABS: 1.0000 mg | ORAL_TABLET | Freq: Every day | ORAL | 0 refills | Status: DC

## 2018-08-23 MED ORDER — ARIPIPRAZOLE 15 MG PO TABS: 15.0000 mg | ORAL_TABLET | Freq: Every day | ORAL | 0 refills | Status: DC

## 2018-09-09 ENCOUNTER — Ambulatory Visit (INDEPENDENT_AMBULATORY_CARE_PROVIDER_SITE_OTHER): Payer: Federal, State, Local not specified - PPO | Admitting: Licensed Clinical Social Worker

## 2018-09-09 ENCOUNTER — Encounter (HOSPITAL_COMMUNITY): Payer: Self-pay | Admitting: Licensed Clinical Social Worker

## 2018-09-09 DIAGNOSIS — F4310 Post-traumatic stress disorder, unspecified: Secondary | ICD-10-CM | POA: Diagnosis not present

## 2018-09-09 DIAGNOSIS — F3163 Bipolar disorder, current episode mixed, severe, without psychotic features: Secondary | ICD-10-CM
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
## 2018-09-16 ENCOUNTER — Ambulatory Visit (INDEPENDENT_AMBULATORY_CARE_PROVIDER_SITE_OTHER): Payer: Federal, State, Local not specified - PPO | Admitting: Psychiatry

## 2018-09-16 ENCOUNTER — Encounter (HOSPITAL_COMMUNITY): Payer: Self-pay | Admitting: Psychiatry

## 2018-09-16 VITALS — BP 126/72 | HR 76 | Ht 73.0 in | Wt 211.0 lb

## 2018-09-16 DIAGNOSIS — H401131 Primary open-angle glaucoma, bilateral, mild stage: Secondary | ICD-10-CM | POA: Diagnosis not present

## 2018-09-16 DIAGNOSIS — Z818 Family history of other mental and behavioral disorders: Secondary | ICD-10-CM | POA: Diagnosis not present

## 2018-09-16 DIAGNOSIS — F319 Bipolar disorder, unspecified: Secondary | ICD-10-CM

## 2018-09-16 DIAGNOSIS — F1210 Cannabis abuse, uncomplicated: Secondary | ICD-10-CM | POA: Diagnosis not present

## 2018-09-16 DIAGNOSIS — F4310 Post-traumatic stress disorder, unspecified: Secondary | ICD-10-CM | POA: Diagnosis not present

## 2018-09-16 MED ORDER — BENZTROPINE MESYLATE 1 MG PO TABS: 1.0000 mg | ORAL_TABLET | Freq: Every day | ORAL | 1 refills | Status: DC

## 2018-09-16 MED ORDER — ARIPIPRAZOLE ER 400 MG IM PRSY
400.0000 mg | PREFILLED_SYRINGE | INTRAMUSCULAR | Status: DC
  Administered 2018-09-16 – 2020-09-06 (×22): 400 mg via INTRAMUSCULAR

## 2018-09-16 MED ORDER — ARIPIPRAZOLE 15 MG PO TABS: 15.0000 mg | ORAL_TABLET | Freq: Every day | ORAL | 0 refills | Status: DC

## 2018-09-16 MED ORDER — TRAZODONE HCL 50 MG PO TABS: 50.0000 mg | ORAL_TABLET | Freq: Every evening | ORAL | 1 refills | Status: DC | PRN

## 2018-09-16 MED ORDER — ARIPIPRAZOLE ER 400 MG IM PRSY: 400.0000 mg | PREFILLED_SYRINGE | INTRAMUSCULAR | 2 refills | Status: DC

## 2018-09-16 NOTE — Progress Notes (Signed)
Patient was brought in by [name] to start the Abilify Maintena 400 mg Injection. Patient was relaxed and had no questions. Patient was instructed on how to taper off of his oral Abilify. Injection of Abilify Maintena 400 mg was prepared as ordered and administered in patients right deltoid. Patient tolerated well and without complaint and will return for his next injection in 28 days.
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
## 2018-09-30 ENCOUNTER — Ambulatory Visit (HOSPITAL_COMMUNITY): Payer: Federal, State, Local not specified - PPO | Admitting: Licensed Clinical Social Worker

## 2018-10-14 ENCOUNTER — Other Ambulatory Visit (HOSPITAL_COMMUNITY): Payer: Self-pay | Admitting: Psychiatry

## 2018-10-14 ENCOUNTER — Ambulatory Visit (HOSPITAL_COMMUNITY): Payer: Federal, State, Local not specified - PPO | Admitting: Licensed Clinical Social Worker

## 2018-10-14 DIAGNOSIS — F4310 Post-traumatic stress disorder, unspecified: Secondary | ICD-10-CM

## 2018-10-28 ENCOUNTER — Encounter (HOSPITAL_COMMUNITY): Payer: Self-pay | Admitting: Licensed Clinical Social Worker

## 2018-10-28 ENCOUNTER — Other Ambulatory Visit: Payer: Self-pay

## 2018-10-28 ENCOUNTER — Ambulatory Visit (INDEPENDENT_AMBULATORY_CARE_PROVIDER_SITE_OTHER): Payer: Self-pay | Admitting: Licensed Clinical Social Worker

## 2018-10-28 DIAGNOSIS — Z539 Procedure and treatment not carried out, unspecified reason: Secondary | ICD-10-CM

## 2018-10-28 NOTE — Progress Notes (Signed)
No show. Appointment was scheduled and verified by clinician on Thursday evening 4/16 to be a telephonic therapy session. Clinician attempted to call at 12:30pm, 12:35pm, texted at 12:35pm, and 12:50. Due to clinician's inability to reach client, this will be considered a no show.  Clinician texted to encouraged Curtis Townsend to reschedule the appointment with the front desk.

## 2018-11-07 ENCOUNTER — Other Ambulatory Visit: Payer: Self-pay

## 2018-11-07 ENCOUNTER — Ambulatory Visit (INDEPENDENT_AMBULATORY_CARE_PROVIDER_SITE_OTHER): Payer: Federal, State, Local not specified - PPO

## 2018-11-07 DIAGNOSIS — F314 Bipolar disorder, current episode depressed, severe, without psychotic features: Secondary | ICD-10-CM | POA: Diagnosis not present

## 2018-11-07 NOTE — Progress Notes (Signed)
Patient arrived today with flat affect and good mood for his injection of Abilify Maintena 400 mg. Patient is overdue for injection and we discussed the importance of coming every month. Patient was also given a patient assistance application to help with his injection. The injection of Abilify Maintena 400 mg was prepared as ordered and administered in patients left ventrogluteal area, patient tolerated well and without complaint. Patient was given an appointment for next month and will call with any questions or concerns.

## 2018-11-14 ENCOUNTER — Ambulatory Visit (INDEPENDENT_AMBULATORY_CARE_PROVIDER_SITE_OTHER): Payer: Federal, State, Local not specified - PPO | Admitting: Licensed Clinical Social Worker

## 2018-11-14 ENCOUNTER — Other Ambulatory Visit: Payer: Self-pay

## 2018-11-14 ENCOUNTER — Encounter (HOSPITAL_COMMUNITY): Payer: Self-pay | Admitting: Licensed Clinical Social Worker

## 2018-11-14 DIAGNOSIS — F314 Bipolar disorder, current episode depressed, severe, without psychotic features: Secondary | ICD-10-CM

## 2018-11-14 DIAGNOSIS — F4310 Post-traumatic stress disorder, unspecified: Secondary | ICD-10-CM
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
## 2018-11-15 ENCOUNTER — Encounter (HOSPITAL_COMMUNITY): Payer: Self-pay | Admitting: Psychiatry

## 2018-11-15 ENCOUNTER — Other Ambulatory Visit: Payer: Self-pay

## 2018-11-15 ENCOUNTER — Ambulatory Visit (INDEPENDENT_AMBULATORY_CARE_PROVIDER_SITE_OTHER): Payer: Federal, State, Local not specified - PPO | Admitting: Psychiatry

## 2018-11-15 DIAGNOSIS — F319 Bipolar disorder, unspecified: Secondary | ICD-10-CM | POA: Diagnosis not present

## 2018-11-15 DIAGNOSIS — F4310 Post-traumatic stress disorder, unspecified: Secondary | ICD-10-CM | POA: Diagnosis not present

## 2018-11-15 MED ORDER — ARIPIPRAZOLE ER 400 MG IM PRSY: 400.0000 mg | PREFILLED_SYRINGE | INTRAMUSCULAR | 2 refills | Status: DC
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
## 2018-11-21 ENCOUNTER — Telehealth: Payer: Self-pay | Admitting: Cardiology

## 2018-11-21 NOTE — Telephone Encounter (Signed)
error 

## 2018-11-21 NOTE — Telephone Encounter (Signed)
Called patient, but, could not leave a msg as VM was not set up yet.

## 2018-11-25 ENCOUNTER — Ambulatory Visit (HOSPITAL_COMMUNITY): Payer: Federal, State, Local not specified - PPO | Admitting: Licensed Clinical Social Worker

## 2018-12-05 ENCOUNTER — Ambulatory Visit (INDEPENDENT_AMBULATORY_CARE_PROVIDER_SITE_OTHER): Payer: Federal, State, Local not specified - PPO

## 2018-12-05 ENCOUNTER — Other Ambulatory Visit: Payer: Self-pay

## 2018-12-05 DIAGNOSIS — F319 Bipolar disorder, unspecified: Secondary | ICD-10-CM

## 2018-12-05 NOTE — Progress Notes (Signed)
Patient arrived today with flat affect and good mood for his injection of Abilify Maintena 400 mg. Patient is overdue for injection and we discussed the importance of coming every month. The injection of Abilify Maintena 400 mg was prepared as ordered and administered in patients right ventrogluteal area, patient tolerated well and without complaint. Patient was given an appointment for next month and will call with any questions or concerns.

## 2018-12-09 ENCOUNTER — Other Ambulatory Visit: Payer: Self-pay

## 2018-12-09 ENCOUNTER — Encounter (HOSPITAL_COMMUNITY): Payer: Self-pay | Admitting: Licensed Clinical Social Worker

## 2018-12-09 ENCOUNTER — Ambulatory Visit (INDEPENDENT_AMBULATORY_CARE_PROVIDER_SITE_OTHER): Payer: Federal, State, Local not specified - PPO | Admitting: Licensed Clinical Social Worker

## 2018-12-09 DIAGNOSIS — F3162 Bipolar disorder, current episode mixed, moderate: Secondary | ICD-10-CM

## 2018-12-09 DIAGNOSIS — F4310 Post-traumatic stress disorder, unspecified: Secondary | ICD-10-CM | POA: Diagnosis not present
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
## 2018-12-23 ENCOUNTER — Ambulatory Visit (INDEPENDENT_AMBULATORY_CARE_PROVIDER_SITE_OTHER): Payer: Federal, State, Local not specified - PPO | Admitting: Licensed Clinical Social Worker

## 2018-12-23 ENCOUNTER — Encounter (HOSPITAL_COMMUNITY): Payer: Self-pay | Admitting: Licensed Clinical Social Worker

## 2018-12-23 ENCOUNTER — Telehealth: Payer: Self-pay | Admitting: *Deleted

## 2018-12-23 ENCOUNTER — Other Ambulatory Visit: Payer: Self-pay

## 2018-12-23 DIAGNOSIS — F4310 Post-traumatic stress disorder, unspecified: Secondary | ICD-10-CM | POA: Diagnosis not present

## 2018-12-23 DIAGNOSIS — F3162 Bipolar disorder, current episode mixed, moderate: Secondary | ICD-10-CM | POA: Diagnosis not present
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
## 2018-12-23 NOTE — Telephone Encounter (Signed)
Unable to leave a message, no voicemail.  

## 2019-01-06 ENCOUNTER — Ambulatory Visit (INDEPENDENT_AMBULATORY_CARE_PROVIDER_SITE_OTHER): Payer: Federal, State, Local not specified - PPO | Admitting: Licensed Clinical Social Worker

## 2019-01-06 ENCOUNTER — Other Ambulatory Visit: Payer: Self-pay

## 2019-01-06 ENCOUNTER — Encounter (HOSPITAL_COMMUNITY): Payer: Self-pay | Admitting: Licensed Clinical Social Worker

## 2019-01-06 DIAGNOSIS — F4310 Post-traumatic stress disorder, unspecified: Secondary | ICD-10-CM

## 2019-01-06 DIAGNOSIS — F3162 Bipolar disorder, current episode mixed, moderate: Secondary | ICD-10-CM | POA: Diagnosis not present

## 2019-01-06 NOTE — Progress Notes (Signed)
Virtual Visit via Telephone Note  I connected with Curtis Townsend on 01/06/19 at 12:30 PM EDT by telephone and verified that I am speaking with the correct person using two identifiers.     I discussed the limitations, risks, security and privacy concerns of performing an evaluation and management service by telephone and the availability of in person appointments. I also discussed with the patient that there may be a patient responsible charge related to this service. The patient expressed understanding and agreed to proceed.  Type of Therapy: Individual Therapy  Treatment Goals addressed: Improve psychiatric symptoms, elevate mood (decreased irritability, increased enjoyment of activities), Improve unhelpful thought patterns, emotional regulation skills (reduce temper outburst), Interpersonal relationship skills  Interventions: Motivational Interviewing and Other: Grounding and Mindfulness techniques  Summary: Curtis Townsend is a 43 y.o. male who presents with PTSD and Bipolar I Disorder, mixed, moderate  Suicidal/Homicidal: No without intent/plan  Therapist Response:  Curtis Townsend met with clinician for individual therapy. Curtis Townsend discussed his psychiatric symptoms and current life events. Curtis Townsend shared that he was upset that he would not be able to go to [facility] for his family member's funeral. Clinician processed the decision and discussed possible alternative options for being present online for the funeral. Curtis Townsend reported he was sad about missing out on seeing his family. However, he also reported that he had made peace with his cousin and felt his mourning was complete. Clinician discussed coping with being home with the kids all the time, having everyone home, and not really being able to go out anywhere. Curtis Townsend reports he has struggled emotionally to not get too bored or agitated. However, he reports that for the most part, he is remaining stable. Clinician explored side effects of  Abilify Maintena. Curtis Townsend reports for the first few days after the injection, food tastes nasty to him and he does not want to eat. However, this changes after the first several days and it is fine.   Plan: Return again in 1-2 weeks.  Diagnosis: Axis I: PTSD and Bipolar I Disorder, mixed, moderate    I discussed the assessment and treatment plan with the patient. The patient was provided an opportunity to ask questions and all were answered. The patient agreed with the plan and demonstrated an understanding of the instructions.   The patient was advised to call back or seek an in-person evaluation if the symptoms worsen or if the condition fails to improve as anticipated.  I provided 5  82mnutes of non-face-to-face time during this encounter.   JMindi Curling LCSW

## 2019-01-14 ENCOUNTER — Other Ambulatory Visit: Payer: Self-pay

## 2019-01-14 ENCOUNTER — Ambulatory Visit (INDEPENDENT_AMBULATORY_CARE_PROVIDER_SITE_OTHER): Payer: Federal, State, Local not specified - PPO

## 2019-01-14 DIAGNOSIS — F314 Bipolar disorder, current episode depressed, severe, without psychotic features: Secondary | ICD-10-CM | POA: Diagnosis not present

## 2019-01-14 NOTE — Progress Notes (Signed)
Patient arrived today with flat affect and good mood for his injection of Abilify Maintena 400 mg. Patient is overdue for injection and we discussed the importance of coming every month. The injection of Abilify Maintena 400 mg was prepared as ordered and administered in patients left ventrogluteal area, patient tolerated well and without complaint. Patient was given an appointment for next month and will call with any questions or concerns.

## 2019-01-21 ENCOUNTER — Ambulatory Visit (INDEPENDENT_AMBULATORY_CARE_PROVIDER_SITE_OTHER): Payer: Federal, State, Local not specified - PPO | Admitting: Licensed Clinical Social Worker

## 2019-01-21 ENCOUNTER — Encounter (HOSPITAL_COMMUNITY): Payer: Self-pay | Admitting: Licensed Clinical Social Worker

## 2019-01-21 ENCOUNTER — Other Ambulatory Visit: Payer: Self-pay

## 2019-01-21 DIAGNOSIS — F3162 Bipolar disorder, current episode mixed, moderate: Secondary | ICD-10-CM

## 2019-01-21 DIAGNOSIS — F4310 Post-traumatic stress disorder, unspecified: Secondary | ICD-10-CM | POA: Diagnosis not present

## 2019-01-21 NOTE — Progress Notes (Signed)
Virtual Visit via Telephone Note  I connected with Curtis Townsend on 01/21/19 at 12:30 PM EDT by telephone and verified that I am speaking with the correct person using two identifiers.    I discussed the limitations, risks, security and privacy concerns of performing an evaluation and management service by telephone and the availability of in person appointments. I also discussed with the patient that there may be a patient responsible charge related to this service. The patient expressed understanding and agreed to proceed.  Type of Therapy: Individual Therapy  Treatment Goals addressed: Improve psychiatric symptoms, elevate mood (decreased irritability, increased enjoyment of activities), Improve unhelpful thought patterns, emotional regulation skills (reduce temper outburst), Interpersonal relationship skills  Interventions: Motivational Interviewing and Other: Grounding and Mindfulness techniques  Summary: Curtis Townsend is a 43 y.o. male who presents with PTSD and Bipolar I Disorder, mixed, moderate  Suicidal/Homicidal: No without intent/plan  Therapist Response:  Curtis Townsend met with clinician for individual therapy. Curtis Townsend discussed his psychiatric symptoms and current life events. Curtis Townsend shared that he has been all right, but not that great. He reports disappointment that he and his family missed their trip to [facility] to visit his friends and family. Curtis Townsend processed how the quarantine and concerns about COVID-19 have impacted his life. Clinician explored ways to work around the quarantine by going out with masks, maintaining social distance, and ordering in food, rather than cooking every night. Curtis Townsend reports he does some of those things and he is trying to spend a lot of time with his family. However, he reports they are starting to get "sick" of each other and on each other's nerves. Clinician reflected the feelings using MI OARS. Clinician also processed ways to change feelings into  something a little more hopeful and patient. Clinician identified positive coping skills and noted the need to maintain some normalcy with routine, activities, reaching out socially with friends or family, etc.   Plan: Return again in 1-2 weeks.  Diagnosis: Axis I: PTSD and Bipolar I Disorder, mixed, moderate    I discussed the assessment and treatment plan with the patient. The patient was provided an opportunity to ask questions and all were answered. The patient agreed with the plan and demonstrated an understanding of the instructions.   The patient was advised to call back or seek an in-person evaluation if the symptoms worsen or if the condition fails to improve as anticipated.  I provided 45 minutes of non-face-to-face time during this encounter.   [name], LCSW

## 2019-01-23 ENCOUNTER — Ambulatory Visit: Payer: Federal, State, Local not specified - PPO | Admitting: Gastroenterology

## 2019-02-03 ENCOUNTER — Other Ambulatory Visit: Payer: Self-pay

## 2019-02-03 ENCOUNTER — Encounter (HOSPITAL_COMMUNITY): Payer: Self-pay | Admitting: Licensed Clinical Social Worker

## 2019-02-03 ENCOUNTER — Ambulatory Visit (INDEPENDENT_AMBULATORY_CARE_PROVIDER_SITE_OTHER): Payer: Federal, State, Local not specified - PPO | Admitting: Licensed Clinical Social Worker

## 2019-02-03 DIAGNOSIS — F4310 Post-traumatic stress disorder, unspecified: Secondary | ICD-10-CM

## 2019-02-03 DIAGNOSIS — F3162 Bipolar disorder, current episode mixed, moderate: Secondary | ICD-10-CM | POA: Diagnosis not present

## 2019-02-03 NOTE — Progress Notes (Signed)
Virtual Visit via Telephone Note  I connected with Curtis Townsend on 02/03/19 at  1:30 PM EDT by telephone and verified that I am speaking with the correct person using two identifiers.     I discussed the limitations, risks, security and privacy concerns of performing an evaluation and management service by telephone and the availability of in person appointments. I also discussed with the patient that there may be a patient responsible charge related to this service. The patient expressed understanding and agreed to proceed.   Type of Therapy: Individual Therapy  Treatment Goals addressed: Improve psychiatric symptoms, elevate mood (decreased irritability, increased enjoyment of activities), Improve unhelpful thought patterns, emotional regulation skills (reduce temper outburst), Interpersonal relationship skills  Interventions: Motivational Interviewing and Other: Grounding and Mindfulness techniques  Summary: Curtis Townsend is a 43 y.o. male who presents with PTSD and Bipolar I Disorder, mixed, severe  Suicidal/Homicidal: No without intent/plan  Therapist Response:  Curtis Townsend met with clinician for individual therapy. Curtis Townsend discussed his psychiatric symptoms and current life events. Curtis Townsend shared that he is bored and somewhat depressed. He reports he feels upset about not being able to go to [facility] for labor Day due to finances and also due to concern over his mother's health. Curtis Townsend reports his mother still tells everyone in [place] that she will see them on Labor Day, which is a problem because they are not going and he feels guilty. Clinician processed thoughts and feelings with Curtis Townsend using MI OARS. Clinician reflected emotions, and discussed options for entertainment in and around the house and neighborhood. Curtis Townsend reports some increased irritability and depression mostly because they are in a smaller house. However, he reports it is a much better environment than the previous house,  as he had so many problems with neighbors.  Clinician explored self care activities and encouraged him to get some exercise. Clinician also encouraged Curtis Townsend to find a hobby or to learn something new to pass the time and focus his mind.   Plan: Return again in 1-2 weeks.  Diagnosis: Axis I: PTSD and Bipolar I Disorder, mixed, severe    I discussed the assessment and treatment plan with the patient. The patient was provided an opportunity to ask questions and all were answered. The patient agreed with the plan and demonstrated an understanding of the instructions.   The patient was advised to call back or seek an in-person evaluation if the symptoms worsen or if the condition fails to improve as anticipated.  I provided 45 minutes of non-face-to-face time during this encounter.   [name], LCSW

## 2019-02-13 ENCOUNTER — Other Ambulatory Visit: Payer: Self-pay

## 2019-02-13 ENCOUNTER — Encounter (HOSPITAL_COMMUNITY): Payer: Self-pay | Admitting: Psychiatry

## 2019-02-13 ENCOUNTER — Ambulatory Visit (INDEPENDENT_AMBULATORY_CARE_PROVIDER_SITE_OTHER): Payer: Federal, State, Local not specified - PPO | Admitting: Psychiatry

## 2019-02-13 DIAGNOSIS — F4310 Post-traumatic stress disorder, unspecified: Secondary | ICD-10-CM | POA: Diagnosis not present

## 2019-02-13 DIAGNOSIS — F319 Bipolar disorder, unspecified: Secondary | ICD-10-CM

## 2019-02-13 MED ORDER — ABILIFY MAINTENA 400 MG IM PRSY: 400.0000 mg | PREFILLED_SYRINGE | INTRAMUSCULAR | 2 refills | Status: DC
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
## 2019-02-17 ENCOUNTER — Ambulatory Visit (HOSPITAL_COMMUNITY): Payer: Federal, State, Local not specified - PPO | Admitting: Licensed Clinical Social Worker

## 2019-02-18 ENCOUNTER — Other Ambulatory Visit: Payer: Self-pay

## 2019-02-18 ENCOUNTER — Encounter (HOSPITAL_COMMUNITY): Payer: Self-pay | Admitting: Licensed Clinical Social Worker

## 2019-02-18 ENCOUNTER — Ambulatory Visit (INDEPENDENT_AMBULATORY_CARE_PROVIDER_SITE_OTHER): Payer: Federal, State, Local not specified - PPO | Admitting: Licensed Clinical Social Worker

## 2019-02-18 DIAGNOSIS — F4310 Post-traumatic stress disorder, unspecified: Secondary | ICD-10-CM | POA: Diagnosis not present

## 2019-02-18 DIAGNOSIS — F319 Bipolar disorder, unspecified: Secondary | ICD-10-CM | POA: Diagnosis not present

## 2019-02-18 NOTE — Progress Notes (Signed)
Virtual Visit via Telephone Note  I connected with Curtis Townsend on 02/18/19 at  2:30 PM EDT by telephone and verified that I am speaking with the correct person using two identifiers.    I discussed the limitations, risks, security and privacy concerns of performing an evaluation and management service by telephone and the availability of in person appointments. I also discussed with the patient that there may be a patient responsible charge related to this service. The patient expressed understanding and agreed to proceed.   Type of Therapy: Individual Therapy  Treatment Goals addressed: Improve psychiatric symptoms, elevate mood (decreased irritability, increased enjoyment of activities), Improve unhelpful thought patterns, emotional regulation skills (reduce temper outburst), Interpersonal relationship skills  Interventions: Motivational Interviewing and Other: Grounding and Mindfulness techniques  Summary: Curtis Townsend is a 43 y.o. male who presents with PTSD and Bipolar I Disorder, mixed, moderate  Suicidal/Homicidal: No without intent/plan  Therapist Response:  Curtis Townsend met with clinician for individual therapy. Curtis Townsend discussed his psychiatric symptoms and current life events. Curtis Townsend shared that he has been trying to entertain his youngest son today to give his wife a break. He reports that her cousin died by "accidental" suicide which has really upset the family. Curtis Townsend reports this is one of several losses in the family they have experienced over the past few months. Clinician explored Curtis Townsend's grief process for his uncle who died in Curtis Townsend. Curtis Townsend reports he is okay, sad, but really trying to care for his mother and his wife who are taking these losses hard. Clinician reflected the ways Curtis Townsend has been helping to support his wife and noted that doing what she needs without him asking or without her asking him may be the most valuable way to support her.  Curtis Townsend  discussed concerns about his children's school. He reports feeling confident that he can help them, but reported concerns for his daughters' happiness with being away from friends and missing out on starting middle school. Curtis Townsend reports overall his mood has been good and he is trying to stay positive. He also notes that the home situation is much better due to having nice neighbors. Clinician commented on environmental factors and how they can impact our mood and world view.   Plan: Return again in 1-2 weeks.  Diagnosis: Axis I: PTSD and Bipolar I Disorder, mixed, moderate    I discussed the assessment and treatment plan with the patient. The patient was provided an opportunity to ask questions and all were answered. The patient agreed with the plan and demonstrated an understanding of the instructions.   The patient was advised to call back or seek an in-person evaluation if the symptoms worsen or if the condition fails to improve as anticipated.  I provided 30 minutes of non-face-to-face time during this encounter.   [name], LCSW

## 2019-02-24 ENCOUNTER — Ambulatory Visit (INDEPENDENT_AMBULATORY_CARE_PROVIDER_SITE_OTHER): Payer: Federal, State, Local not specified - PPO

## 2019-02-24 ENCOUNTER — Other Ambulatory Visit: Payer: Self-pay

## 2019-02-24 DIAGNOSIS — F319 Bipolar disorder, unspecified: Secondary | ICD-10-CM

## 2019-02-24 NOTE — Progress Notes (Signed)
Patient arrived today with flat affect and good mood for his injection of Abilify Maintena 400 mg. Patient is overdue for injection and we discussed the importance of coming every month and on time. The injection of Abilify Maintena 400 mg was prepared as ordered and administered in patientsrightventrogluteal area, patient tolerated well and without complaint. Patient was given an appointment for next month and will call with any questions or concerns.

## 2019-03-03 ENCOUNTER — Encounter (HOSPITAL_COMMUNITY): Payer: Self-pay | Admitting: Licensed Clinical Social Worker

## 2019-03-03 ENCOUNTER — Ambulatory Visit (INDEPENDENT_AMBULATORY_CARE_PROVIDER_SITE_OTHER): Payer: Federal, State, Local not specified - PPO | Admitting: Licensed Clinical Social Worker

## 2019-03-03 ENCOUNTER — Other Ambulatory Visit: Payer: Self-pay

## 2019-03-03 DIAGNOSIS — F319 Bipolar disorder, unspecified: Secondary | ICD-10-CM | POA: Diagnosis not present

## 2019-03-03 DIAGNOSIS — F4310 Post-traumatic stress disorder, unspecified: Secondary | ICD-10-CM

## 2019-03-03 NOTE — Progress Notes (Signed)
Virtual Visit via Telephone Note  I connected with Curtis Townsend on 03/03/19 at 12:30 PM EDT by telephone and verified that I am speaking with the correct person using two identifiers.     I discussed the limitations, risks, security and privacy concerns of performing an evaluation and management service by telephone and the availability of in person appointments. I also discussed with the patient that there may be a patient responsible charge related to this service. The patient expressed understanding and agreed to proceed.  Type of Therapy: Individual Therapy  Treatment Goals addressed: Improve psychiatric symptoms, elevate mood (decreased irritability, increased enjoyment of activities), Improve unhelpful thought patterns, emotional regulation skills (reduce temper outburst), Interpersonal relationship skills  Interventions: Motivational Interviewing and Other: Grounding and Mindfulness techniques  Summary: Curtis Townsend is a 43 y.o. male who presents with PTSD and Bipolar I Disorder, mixed, severe  Suicidal/Homicidal: No without intent/plan  Therapist Response:  Curtis Townsend met with clinician for individual therapy. Curtis Townsend discussed his psychiatric symptoms and current life events. Curtis Townsend shared that he continues to feel depressed and bored being at home all the time. Clinician explored ways to feel more in control of his life and ways that he spends his time. Clinician also validated the feelings about being home and noted ways to reframe using CBT. Curtis Townsend reports that he has not spoken to his brother, but he also feels he would have nothing to say. Clinician processed the relationship and provided time and space for Curtis Townsend to search for his true deep feelings. Clinician identified the tendency for Curtis Townsend to make things worse the longer we sit with emotions. Clinician encouraged Curtis Townsend to find space in his mind to let go of his pain and to actually move forward, rather than just telling  himself that he has moved on.  Clinician explored options for having a date night with his wife, as both of them are feeling stressed and disconnected from each other.   Plan: Return again in 1-2 weeks.  Diagnosis: Axis I: PTSD and Bipolar I Disorder, mixed, severe   I discussed the assessment and treatment plan with the patient. The patient was provided an opportunity to ask questions and all were answered. The patient agreed with the plan and demonstrated an understanding of the instructions.   The patient was advised to call back or seek an in-person evaluation if the symptoms worsen or if the condition fails to improve as anticipated.  I provided 45 minutes of non-face-to-face time during this encounter.   [name], LCSW

## 2019-03-17 ENCOUNTER — Ambulatory Visit (HOSPITAL_COMMUNITY): Payer: Federal, State, Local not specified - PPO | Admitting: Licensed Clinical Social Worker

## 2019-03-25 ENCOUNTER — Ambulatory Visit (INDEPENDENT_AMBULATORY_CARE_PROVIDER_SITE_OTHER): Payer: Federal, State, Local not specified - PPO | Admitting: Gastroenterology

## 2019-03-25 ENCOUNTER — Other Ambulatory Visit (INDEPENDENT_AMBULATORY_CARE_PROVIDER_SITE_OTHER): Payer: Federal, State, Local not specified - PPO

## 2019-03-25 ENCOUNTER — Encounter: Payer: Self-pay | Admitting: Gastroenterology

## 2019-03-25 ENCOUNTER — Other Ambulatory Visit: Payer: Self-pay

## 2019-03-25 VITALS — BP 110/80 | HR 96 | Temp 98.2°F | Ht 73.0 in | Wt 208.6 lb

## 2019-03-25 DIAGNOSIS — R1013 Epigastric pain: Secondary | ICD-10-CM

## 2019-03-25 DIAGNOSIS — R112 Nausea with vomiting, unspecified: Secondary | ICD-10-CM | POA: Diagnosis not present

## 2019-03-25 LAB — COMPREHENSIVE METABOLIC PANEL
ALT: 22 U/L (ref 0–53)
AST: 15 U/L (ref 0–37)
Albumin: 4.5 g/dL (ref 3.5–5.2)
Alkaline Phosphatase: 90 U/L (ref 39–117)
BUN: 9 mg/dL (ref 6–23)
CO2: 33 mEq/L — ABNORMAL HIGH (ref 19–32)
Calcium: 10.4 mg/dL (ref 8.4–10.5)
Chloride: 101 mEq/L (ref 96–112)
Creatinine, Ser: 1.23 mg/dL (ref 0.40–1.50)
GFR: 77.62 mL/min (ref >60.00)
Glucose, Bld: 99 mg/dL (ref 70–99)
Potassium: 4.1 mEq/L (ref 3.5–5.1)
Sodium: 142 mEq/L (ref 135–145)
Total Bilirubin: 0.9 mg/dL (ref 0.2–1.2)
Total Protein: 7.8 g/dL (ref 6.0–8.3)

## 2019-03-25 LAB — CBC WITH DIFFERENTIAL/PLATELET
Basophils Absolute: 0.1 10*3/uL (ref 0.0–0.1)
Basophils Relative: 0.9 % (ref 0.0–3.0)
Eosinophils Absolute: 0.1 10*3/uL (ref 0.0–0.7)
Eosinophils Relative: 1.3 % (ref 0.0–5.0)
HCT: 49.4 % (ref 39.0–52.0)
Hemoglobin: 16.8 g/dL (ref 13.0–17.0)
Lymphocytes Relative: 36.6 % (ref 12.0–46.0)
Lymphs Abs: 2.5 10*3/uL (ref 0.7–4.0)
MCHC: 34 g/dL (ref 30.0–36.0)
MCV: 93.4 fl (ref 78.0–100.0)
Monocytes Absolute: 0.5 10*3/uL (ref 0.1–1.0)
Monocytes Relative: 6.8 % (ref 3.0–12.0)
Neutro Abs: 3.8 10*3/uL (ref 1.4–7.7)
Neutrophils Relative %: 54.4 % (ref 43.0–77.0)
Platelets: 160 10*3/uL (ref 150.0–400.0)
RBC: 5.29 Mil/uL (ref 4.22–5.81)
RDW: 13.4 % (ref 11.5–15.5)
WBC: 6.9 10*3/uL (ref 4.0–10.5)

## 2019-03-25 LAB — TSH: TSH: 0.96 u[IU]/mL (ref 0.35–4.50)

## 2019-03-25 LAB — LIPASE: Lipase: 55 U/L (ref 11.0–59.0)

## 2019-03-25 LAB — VITAMIN B12: Vitamin B-12: 201 pg/mL — ABNORMAL LOW (ref 211–911)

## 2019-03-25 MED ORDER — PANTOPRAZOLE SODIUM 40 MG PO TBEC: 40.0000 mg | DELAYED_RELEASE_TABLET | Freq: Every day | ORAL | 11 refills | Status: DC

## 2019-03-25 NOTE — Progress Notes (Signed)
History of Present Illness: This is a 43 year old male referred by Janith Lima, MD for the evaluation of epigastric pain, nausea and vomiting.  He relates symptoms been brought on by meals.  He notes a headache associated with symptoms as well.  He states his weight fluctuates but there is no clear downtrend.  Symptoms have been present for about 2 years.  Certain foods appear to exacerbate his symptoms although he does not always avoid them.  He is followed closely by [facility] for PTSD and Bipolar disorder. CT AP 10/2017 was negative. Head CT 07/2018 was negative. Denies weight loss, constipation, diarrhea, change in stool caliber, melena, hematochezia, dysphagia, reflux symptoms, chest pain.    No Known Allergies Outpatient Medications Prior to Visit  Medication Sig Dispense Refill  . ARIPiprazole ER (ABILIFY MAINTENA) 400 MG PRSY prefilled syringe Inject 400 mg into the muscle every 28 (twenty-eight) days. 1 each 2  . benztropine (COGENTIN) 1 MG tablet Take 1 tablet (1 mg total) by mouth daily. (Patient not taking: Reported on 11/15/2018) 30 tablet 1  . hydrOXYzine (ATARAX/VISTARIL) 50 MG tablet Take 1 tablet (50 mg total) by mouth every 6 (six) hours as needed for anxiety. (Patient not taking: Reported on 11/15/2018) 60 tablet 0  . ibuprofen (ADVIL,MOTRIN) 200 MG tablet Take 600 mg by mouth every 6 (six) hours as needed for headache.    . traZODone (DESYREL) 50 MG tablet Take 1 tablet (50 mg total) by mouth at bedtime as needed for sleep. (Patient not taking: Reported on 11/15/2018) 30 tablet 1   Facility-Administered Medications Prior to Visit  Medication Dose Route Frequency Provider Last Rate Last Dose  . ARIPiprazole ER (ABILIFY MAINTENA) 400 MG prefilled syringe 400 mg  400 mg Intramuscular Q28 days [name], MD   400 mg at 02/24/19 1513   Past Medical History:  Diagnosis Date  . Alcoholism ([place])   . Anxiety   . Atypical chest pain   . Bipolar 1 disorder ([place])   . Depression   .  Depression   . Diabetes ([place])   . History of chicken pox   . History of kidney stones   . Hyperlipemia    no current med.  . IBS (irritable bowel syndrome)   . Lipoma of back 05/2012   right  . Seasonal allergies   . Wears partial dentures    upper   Past Surgical History:  Procedure Laterality Date  . LIPOMA EXCISION  06/10/2012   Procedure: EXCISION LIPOMA;  Surgeon: Ralene Ok, MD;  Location: [facility];  Service: General;  Laterality: Right;  excision of back lipoma on right 3x3cm   Social History   Socioeconomic History  . Marital status: Married    Spouse name: Chrystal  . Number of children: 1  . Years of education: 63  . Highest education level: Associate degree: academic program  Occupational History  . Occupation: disabled  Social Needs  . Financial resource strain: Not on file  . Food insecurity    Worry: Not on file    Inability: Not on file  . Transportation needs    Medical: Not on file    Non-medical: Not on file  Tobacco Use  . Smoking status: Never Smoker  . Smokeless tobacco: Never Used  Substance and Sexual Activity  . Alcohol use: Yes    Alcohol/week: 0.0 standard drinks    Comment: Occasional use  . Drug use: Yes    Comment: states  daily THC  . Sexual activity: Yes    Partners: Female    Birth control/protection: None  Lifestyle  . Physical activity    Days per week: Not on file    Minutes per session: Not on file  . Stress: Not on file  Relationships  . Social Herbalist on phone: Not on file    Gets together: Not on file    Attends religious service: Not on file    Active member of club or organization: Not on file    Attends meetings of clubs or organizations: Not on file    Relationship status: Not on file  Other Topics Concern  . Not on file  Social History Narrative   Patient is right-handed. He lives with his wife and small son in a 2 story house. He only occasionally drinks coffee, tea or soda.  He is active, though does not exercise.   Family History  Problem Relation Age of Onset  . Stroke Mother   . Hypertension Mother   . Diabetes Mother   . Hyperlipidemia Mother   . Hypertension Father   . Diabetes Father   . Schizophrenia Father   . Hypertension Sister   . Hypertension Brother   . Hyperlipidemia Brother   . Cancer Paternal Uncle        Prostate Cancer  . Stroke Maternal Grandmother   . Heart attack Maternal Grandmother   . Diabetes Paternal Grandmother   . High Cholesterol Paternal Grandmother   . Hypertension Brother   . Hyperlipidemia Brother   . Cancer Sister   . High blood pressure Sister   . Hyperlipidemia Sister        Review of Systems: Pertinent positive and negative review of systems were noted in the above HPI section. All other review of systems were otherwise negative.    Physical Exam: General: Well developed, well nourished, no acute distress Head: Normocephalic and atraumatic Eyes:  sclerae anicteric, EOMI Ears: Normal auditory acuity Mouth: No deformity or lesions Neck: Supple, no masses or thyromegaly Lungs: Clear throughout to auscultation Heart: Regular rate and rhythm; no murmurs, rubs or bruits Abdomen: Soft, non tender and non distended. No masses, hepatosplenomegaly or hernias noted. Normal Bowel sounds Rectal: Not done Musculoskeletal: Symmetrical with no gross deformities  Skin: No lesions on visible extremities Pulses:  Normal pulses noted Extremities: No clubbing, cyanosis, edema or deformities noted Neurological: Alert oriented x 4, grossly nonfocal Cervical Nodes:  No significant cervical adenopathy Inguinal Nodes: No significant inguinal adenopathy Psychological:  Alert and cooperative. Normal mood and affect   Assessment and Recommendations:  1. Intermittent postprandial epigastric pain, N/V associated with a headache for several years. R/O GERD, ulcer, cholelithiasis. Protonix 40 mg daily. Antireflux measures. CMP,  CBC, TSH, lipase, B12. Schedule RUQ [name] and EGD. The risks (including bleeding, perforation, infection, missed lesions, medication reactions and possible hospitalization or surgery if complications occur), benefits, and alternatives to endoscopy with possible biopsy and possible dilation were discussed with the patient and they consent to proceed.      cc: Janith Lima, MD [address] [place],  [place] [address]

## 2019-03-25 NOTE — Patient Instructions (Signed)
We have sent the following medications to your pharmacy for you to pick up at your convenience: Protonix.   Patient advised to avoid spicy, acidic, citrus, chocolate, mints, fruit and fruit juices.  Limit the intake of caffeine, alcohol and Soda.  Don't exercise too soon after eating.  Don't lie down within 3-4 hours of eating.  Elevate the head of your bed.  You have been scheduled for an abdominal ultrasound at [facility] Radiology (1st floor of hospital) on 03/28/19 at 10:00am. Please arrive 15 minutes prior to your appointment for registration. Make certain not to have anything to eat or drink 6 hours prior to your appointment. Should you need to reschedule your appointment, please contact radiology at [phone]. This test typically takes about 30 minutes to perform.  You have been scheduled for an endoscopy. Please follow written instructions given to you at your visit today. If you use inhalers (even only as needed), please bring them with you on the day of your procedure. Your physician has requested that you go to www.startemmi.com and enter the access code given to you at your visit today. This web site gives a general overview about your procedure. However, you should still follow specific instructions given to you by our office regarding your preparation for the procedure.  Thank you for choosing me and [facility].  Pricilla Riffle. Dagoberto Ligas., MD., Marval Regal

## 2019-03-26 ENCOUNTER — Other Ambulatory Visit: Payer: Self-pay

## 2019-03-26 ENCOUNTER — Ambulatory Visit (AMBULATORY_SURGERY_CENTER): Payer: Federal, State, Local not specified - PPO | Admitting: Gastroenterology

## 2019-03-26 ENCOUNTER — Encounter: Payer: Self-pay | Admitting: Gastroenterology

## 2019-03-26 ENCOUNTER — Other Ambulatory Visit: Payer: Self-pay | Admitting: Gastroenterology

## 2019-03-26 VITALS — BP 108/83 | HR 86 | Temp 98.4°F | Resp 18 | Ht 73.0 in | Wt 208.0 lb

## 2019-03-26 DIAGNOSIS — K2950 Unspecified chronic gastritis without bleeding: Secondary | ICD-10-CM | POA: Diagnosis not present

## 2019-03-26 DIAGNOSIS — R112 Nausea with vomiting, unspecified: Secondary | ICD-10-CM

## 2019-03-26 DIAGNOSIS — K2960 Other gastritis without bleeding: Secondary | ICD-10-CM

## 2019-03-26 DIAGNOSIS — K3189 Other diseases of stomach and duodenum: Secondary | ICD-10-CM

## 2019-03-26 DIAGNOSIS — K219 Gastro-esophageal reflux disease without esophagitis: Secondary | ICD-10-CM | POA: Diagnosis not present

## 2019-03-26 DIAGNOSIS — R1013 Epigastric pain: Secondary | ICD-10-CM

## 2019-03-26 MED ORDER — SODIUM CHLORIDE 0.9 % IV SOLN: 500.0000 mL | Freq: Once | INTRAVENOUS | Status: DC

## 2019-03-26 NOTE — Progress Notes (Signed)
Called to room to assist during endoscopic procedure.  Patient ID and intended procedure confirmed with present staff. Received instructions for my participation in the procedure from the performing physician.  

## 2019-03-26 NOTE — Op Note (Signed)
[facility] Patient Name: Curtis Townsend Procedure Date: 03/26/2019 1:26 PM MRN: [id number] Endoscopist: Ladene Artist , MD Age: 43 Referring MD:  Date of Birth: [birth year] Gender: Male Account #: [id number] Procedure:                Upper GI endoscopy Indications:              Epigastric abdominal pain, Nausea with vomiting Medicines:                Monitored Anesthesia Care Procedure:                Pre-Anesthesia Assessment:                           - Prior to the procedure, a History and Physical                            was performed, and patient medications and                            allergies were reviewed. The patient's tolerance of                            previous anesthesia was also reviewed. The risks                            and benefits of the procedure and the sedation                            options and risks were discussed with the patient.                            All questions were answered, and informed consent                            was obtained. Prior Anticoagulants: The patient has                            taken no previous anticoagulant or antiplatelet                            agents. ASA Grade Assessment: III - A patient with                            severe systemic disease. After reviewing the risks                            and benefits, the patient was deemed in                            satisfactory condition to undergo the procedure.                           After obtaining informed consent, the endoscope was  passed under direct vision. Throughout the                            procedure, the patient's blood pressure, pulse, and                            oxygen saturations were monitored continuously. The                            Endoscope was introduced through the mouth, and                            advanced to the second part of duodenum. The upper                            GI  endoscopy was accomplished without difficulty.                            The patient tolerated the procedure well. Scope In: Scope Out: Findings:                 The examined esophagus was normal.                           Patchy mildly erythematous mucosa without bleeding                            was found in the gastric body and in the gastric                            antrum. Biopsies were taken with a cold forceps for                            histology.                           A single 6 mm angioectasia with no bleeding was                            found in the gastric antrum.                           The exam of the stomach was otherwise normal.                           Patchy mildly erythematous mucosa without active                            bleeding and with no stigmata of bleeding was found                            in the duodenal bulb.                           The exam of the duodenum  was otherwise normal. Complications:            No immediate complications. Estimated Blood Loss:     Estimated blood loss was minimal. Impression:               - Normal esophagus.                           - Erythematous mucosa in the gastric body and                            antrum. Biopsied.                           - A single non-bleeding angioectasia in the stomach.                           - Erythematous mucosa in duodenal bulb. Recommendation:           - Patient has a contact number available for                            emergencies. The signs and symptoms of potential                            delayed complications were discussed with the                            patient. Return to normal activities tomorrow.                            Written discharge instructions were provided to the                            patient.                           - Resume previous diet.                           - Continue present medications.                           - Await  pathology results.                           - No aspirin, ibuprofen, naproxen, or other                            non-steroidal anti-inflammatory drugs. Ladene Artist, MD 03/26/2019 1:50:33 PM This report has been signed electronically.

## 2019-03-26 NOTE — Progress Notes (Signed)
Patient does not have a VM I sent the results and recommendations to him via [facility]

## 2019-03-26 NOTE — Progress Notes (Signed)
VS- [place]  Pt states he hasn't smoke marijuana in over a week

## 2019-03-26 NOTE — Patient Instructions (Signed)
YOU HAD AN ENDOSCOPIC PROCEDURE TODAY AT THE [facility]:   Refer to the procedure report that was given to you for any specific questions about what was found during the examination.  If the procedure report does not answer your questions, please call your gastroenterologist to clarify.  If you requested that your care partner not be given the details of your procedure findings, then the procedure report has been included in a sealed envelope for you to review at your convenience later.  YOU SHOULD EXPECT: Some feelings of bloating in the abdomen. Passage of more gas than usual.  Walking can help get rid of the air that was put into your GI tract during the procedure and reduce the bloating. If you had a lower endoscopy (such as a colonoscopy or flexible sigmoidoscopy) you may notice spotting of blood in your stool or on the toilet paper. If you underwent a bowel prep for your procedure, you may not have a normal bowel movement for a few days.  Please Note:  You might notice some irritation and congestion in your nose or some drainage.  This is from the oxygen used during your procedure.  There is no need for concern and it should clear up in a day or so.  SYMPTOMS TO REPORT IMMEDIATELY:   Following upper endoscopy (EGD)  Vomiting of blood or coffee ground material  New chest pain or pain under the shoulder blades  Painful or persistently difficult swallowing  New shortness of breath  Fever of 100F or higher  Black, tarry-looking stools  For urgent or emergent issues, a gastroenterologist can be reached at any hour by calling [phone].   DIET:  We do recommend a small meal at first, but then you may proceed to your regular diet.  Drink plenty of fluids but you should avoid alcoholic beverages for 24 hours.  ACTIVITY:  You should plan to take it easy for the rest of today and you should NOT DRIVE or use heavy machinery until tomorrow (because of the sedation medicines used  during the test).    FOLLOW UP: Our staff will call the number listed on your records 48-72 hours following your procedure to check on you and address any questions or concerns that you may have regarding the information given to you following your procedure. If we do not reach you, we will leave a message.  We will attempt to reach you two times.  During this call, we will ask if you have developed any symptoms of COVID 19. If you develop any symptoms (ie: fever, flu-like symptoms, shortness of breath, cough etc.) before then, please call [phone].  If you test positive for Covid 19 in the 2 weeks post procedure, please call and report this information to us.    If any biopsies were taken you will be contacted by phone or by letter within the next 1-3 weeks.  Please call us at [phone] if you have not heard about the biopsies in 3 weeks.    SIGNATURES/CONFIDENTIALITY: You and/or your care partner have signed paperwork which will be entered into your electronic medical record.  These signatures attest to the fact that that the information above on your After Visit Summary has been reviewed and is understood.  Full responsibility of the confidentiality of this discharge information lies with you and/or your care-partner. 

## 2019-03-26 NOTE — Progress Notes (Signed)
Report to PACU, RN, vss, BBS= Clear.  

## 2019-03-27 ENCOUNTER — Ambulatory Visit (HOSPITAL_COMMUNITY): Payer: Federal, State, Local not specified - PPO

## 2019-03-28 ENCOUNTER — Telehealth: Payer: Self-pay | Admitting: *Deleted

## 2019-03-28 ENCOUNTER — Other Ambulatory Visit: Payer: Self-pay

## 2019-03-28 ENCOUNTER — Ambulatory Visit (HOSPITAL_COMMUNITY)
Admission: RE | Admit: 2019-03-28 | Discharge: 2019-03-28 | Disposition: A | Discharge status: Home / Self Care | Payer: Federal, State, Local not specified - PPO | Source: Ambulatory Visit | Attending: Gastroenterology | Admitting: Gastroenterology

## 2019-03-28 ENCOUNTER — Telehealth: Payer: Self-pay

## 2019-03-28 DIAGNOSIS — R1013 Epigastric pain: Secondary | ICD-10-CM | POA: Diagnosis not present

## 2019-03-28 DIAGNOSIS — R109 Unspecified abdominal pain: Secondary | ICD-10-CM | POA: Diagnosis not present

## 2019-03-28 DIAGNOSIS — R112 Nausea with vomiting, unspecified: Secondary | ICD-10-CM | POA: Diagnosis not present

## 2019-03-28 DIAGNOSIS — R1110 Vomiting, unspecified: Secondary | ICD-10-CM | POA: Diagnosis not present

## 2019-03-28 NOTE — Telephone Encounter (Signed)
Attempted to reach patient for post-procedure f/u call. No answer. Unable to leave message as voicemail box is full.

## 2019-03-28 NOTE — Telephone Encounter (Signed)
  Follow up Call-  Call back number 03/26/2019  Post procedure Call Back phone  # [phone]  Permission to leave phone message Yes  Some recent data might be hidden    Voicemail not set up; unable to leave a message

## 2019-03-31 ENCOUNTER — Ambulatory Visit (INDEPENDENT_AMBULATORY_CARE_PROVIDER_SITE_OTHER): Payer: Federal, State, Local not specified - PPO | Admitting: Licensed Clinical Social Worker

## 2019-03-31 ENCOUNTER — Other Ambulatory Visit: Payer: Self-pay

## 2019-03-31 ENCOUNTER — Encounter (HOSPITAL_COMMUNITY): Payer: Self-pay | Admitting: Licensed Clinical Social Worker

## 2019-03-31 DIAGNOSIS — F4310 Post-traumatic stress disorder, unspecified: Secondary | ICD-10-CM | POA: Diagnosis not present

## 2019-03-31 DIAGNOSIS — F3160 Bipolar disorder, current episode mixed, unspecified: Secondary | ICD-10-CM | POA: Diagnosis not present

## 2019-03-31 NOTE — Progress Notes (Signed)
Virtual Visit via Telephone Note  I connected with Curtis Townsend on 03/31/19 at 12:30 PM EDT by telephone and verified that I am speaking with the correct person using two identifiers.   I discussed the limitations, risks, security and privacy concerns of performing an evaluation and management service by telephone and the availability of in person appointments. I also discussed with the patient that there may be a patient responsible charge related to this service. The patient expressed understanding and agreed to proceed.   Type of Therapy: Individual Therapy  Treatment Goals addressed: Improve psychiatric symptoms, elevate mood (decreased irritability, increased enjoyment of activities), Improve unhelpful thought patterns, emotional regulation skills (reduce temper outburst), Interpersonal relationship skills  Interventions: Motivational Interviewing and Other: Grounding and Mindfulness techniques  Summary: Curtis Townsend is a 43 y.o. male who presents with PTSD and Bipolar I Disorder, mixed, severe  Suicidal/Homicidal: No without intent/plan  Therapist Response:  Kashtyn met with clinician for individual therapy. [name] discussed his psychiatric symptoms and current life events. Curtis Townsend shared that he has not been feeling that well due to a stomach illness or reflux. He was not certain the reason for his ailment, but reported that he no longer can eat greasy foods due to this. Clinician noted this may be a side effect of age and encouraged Curtis Townsend to keep a food diary. Curtis Townsend also reported low mood overall, some increased arguing with his mother, who lives with them, and overall boredom. Clinician processed ways to think about this situation of being quarantined in the house and noted ways to enjoy himself. Clinician also updated Curtis Townsend on activities that are now open in the community, such as parks and the [employer]. Clinician encouraged Curtis Townsend to practice his thought stopping  and focus on the positive, as well as things he can control. Clinician utilized CBT to teach thought stopping and distraction techniques.   Plan: Return again in 2 weeks.  Diagnosis: Axis I: PTSD and Bipolar I Disorder, mixed, severe   I discussed the assessment and treatment plan with the patient. The patient was provided an opportunity to ask questions and all were answered. The patient agreed with the plan and demonstrated an understanding of the instructions.   The patient was advised to call back or seek an in-person evaluation if the symptoms worsen or if the condition fails to improve as anticipated.  I provided 45 minutes of non-face-to-face time during this encounter.   Curtis Curling, LCSW

## 2019-04-03 ENCOUNTER — Encounter: Payer: Self-pay | Admitting: Gastroenterology

## 2019-04-04 ENCOUNTER — Other Ambulatory Visit: Payer: Self-pay

## 2019-04-04 ENCOUNTER — Ambulatory Visit (INDEPENDENT_AMBULATORY_CARE_PROVIDER_SITE_OTHER): Payer: Federal, State, Local not specified - PPO

## 2019-04-04 DIAGNOSIS — F319 Bipolar disorder, unspecified: Secondary | ICD-10-CM | POA: Diagnosis not present

## 2019-04-04 NOTE — Progress Notes (Signed)
Patient arrived today with flat affect and good mood for his injection of Abilify Maintena 400 mg. Patient is overdue for injection and we discussed the importance of coming every month and on time. The injection of Abilify Maintena 400 mg was prepared as ordered and administered in patientsleftventrogluteal area, patient tolerated well and without complaint. Patient was given an appointment for next month and will call with any questions or concerns.

## 2019-04-07 ENCOUNTER — Telehealth: Payer: Self-pay | Admitting: Gastroenterology

## 2019-04-07 ENCOUNTER — Other Ambulatory Visit: Payer: Self-pay

## 2019-04-07 DIAGNOSIS — R112 Nausea with vomiting, unspecified: Secondary | ICD-10-CM

## 2019-04-07 DIAGNOSIS — R1013 Epigastric pain: Secondary | ICD-10-CM

## 2019-04-07 NOTE — Telephone Encounter (Signed)
Pt scheduled for HIDA scan at [facility] 04/15/19@7 :30am, pt to arrive there at 7am. Pt to be NPO after midnight. [name] aware of appt.

## 2019-04-14 ENCOUNTER — Other Ambulatory Visit: Payer: Self-pay

## 2019-04-14 ENCOUNTER — Ambulatory Visit (INDEPENDENT_AMBULATORY_CARE_PROVIDER_SITE_OTHER): Payer: Federal, State, Local not specified - PPO | Admitting: Licensed Clinical Social Worker

## 2019-04-14 ENCOUNTER — Encounter (HOSPITAL_COMMUNITY): Payer: Self-pay | Admitting: Licensed Clinical Social Worker

## 2019-04-14 DIAGNOSIS — F3160 Bipolar disorder, current episode mixed, unspecified: Secondary | ICD-10-CM

## 2019-04-14 NOTE — Progress Notes (Signed)
Virtual Visit via Telephone Note  I connected with Curtis Townsend on 04/14/19 at 12:30 PM EDT by telephone and verified that I am speaking with the correct person using two identifiers.     I discussed the limitations, risks, security and privacy concerns of performing an evaluation and management service by telephone and the availability of in person appointments. I also discussed with the patient that there may be a patient responsible charge related to this service. The patient expressed understanding and agreed to proceed.   Type of Therapy: Individual Therapy  Treatment Goals addressed: Improve psychiatric symptoms, elevate mood (decreased irritability, increased enjoyment of activities), Improve unhelpful thought patterns, emotional regulation skills (reduce temper outburst), Interpersonal relationship skills  Interventions: Motivational Interviewing and Other: Grounding and Mindfulness techniques  Summary: Curtis Townsend is a 43 y.o. male who presents with PTSD and Bipolar I Disorder, mixed, moderate  Suicidal/Homicidal: No without intent/plan  Therapist Response:  [name] met with clinician for individual therapy. Mica discussed his psychiatric symptoms and current life events. [name] shared that he has been doing alright. However, he reports he will have to go the hospital tomorrow for testing on his gall bladder, which has been giving him problems over the past few weeks. Clinician explored the issues and noted that it was good for him to reach out and communicate with his dr about what is going on. Clinician reminded Curtis Townsend that he is not too far from the man who used to not communicate with professionals for fear that he would hear something he didn't like. Curtis Townsend agreed that he is thinking more clearly and more maturely. Clinician explored plans for the kids to return to school.Curtis Townsend reports he will be sending his children to school, but continues to fear for their  safety due to Curtis Townsend. Clinician processed these fears and provided feedback normalizing these fears.   Plan: Return again in 1-2 weeks.  Diagnosis: Axis I: PTSD and Bipolar I Disorder, mixed, severe   I discussed the assessment and treatment plan with the patient. The patient was provided an opportunity to ask questions and all were answered. The patient agreed with the plan and demonstrated an understanding of the instructions.   The patient was advised to call back or seek an in-person evaluation if the symptoms worsen or if the condition fails to improve as anticipated.  I provided 40 minutes of non-face-to-face time during this encounter.   Curtis Curling, LCSW

## 2019-04-15 ENCOUNTER — Other Ambulatory Visit: Payer: Self-pay

## 2019-04-15 ENCOUNTER — Encounter (HOSPITAL_COMMUNITY)
Admission: RE | Admit: 2019-04-15 | Discharge: 2019-04-15 | Disposition: A | Discharge status: Home / Self Care | Payer: Federal, State, Local not specified - PPO | Source: Ambulatory Visit | Attending: Gastroenterology | Admitting: Gastroenterology

## 2019-04-15 DIAGNOSIS — R1013 Epigastric pain: Secondary | ICD-10-CM | POA: Diagnosis not present

## 2019-04-15 DIAGNOSIS — R112 Nausea with vomiting, unspecified: Secondary | ICD-10-CM | POA: Diagnosis not present

## 2019-04-15 MED ORDER — TECHNETIUM TC 99M MEBROFENIN IV KIT
5.1000 | PACK | Freq: Once | INTRAVENOUS | Status: AC | PRN
  Administered 2019-04-15: 5.1 via INTRAVENOUS

## 2019-04-24 ENCOUNTER — Ambulatory Visit (HOSPITAL_COMMUNITY): Payer: Federal, State, Local not specified - PPO

## 2019-04-28 ENCOUNTER — Other Ambulatory Visit: Payer: Self-pay

## 2019-04-28 ENCOUNTER — Ambulatory Visit (INDEPENDENT_AMBULATORY_CARE_PROVIDER_SITE_OTHER): Payer: Federal, State, Local not specified - PPO | Admitting: Licensed Clinical Social Worker

## 2019-04-28 ENCOUNTER — Encounter (HOSPITAL_COMMUNITY): Payer: Self-pay | Admitting: Licensed Clinical Social Worker

## 2019-04-28 DIAGNOSIS — F3160 Bipolar disorder, current episode mixed, unspecified: Secondary | ICD-10-CM
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
## 2019-05-05 ENCOUNTER — Other Ambulatory Visit: Payer: Self-pay

## 2019-05-05 ENCOUNTER — Ambulatory Visit (INDEPENDENT_AMBULATORY_CARE_PROVIDER_SITE_OTHER): Payer: Federal, State, Local not specified - PPO

## 2019-05-05 DIAGNOSIS — F319 Bipolar disorder, unspecified: Secondary | ICD-10-CM

## 2019-05-05 NOTE — Progress Notes (Signed)
Patient arrived today with flat affect and good mood for his injection of Abilify Maintena 400 mg. Patient is on time for his injection this month. The injection of Abilify Maintena 400 mg was prepared as ordered and administered in patientsrightventrogluteal area, patient tolerated well and without complaint. Patient was given an appointment for next month and will call with any questions or concerns.

## 2019-05-07 ENCOUNTER — Ambulatory Visit: Payer: Federal, State, Local not specified - PPO | Admitting: Gastroenterology

## 2019-05-07 DIAGNOSIS — M25512 Pain in left shoulder: Secondary | ICD-10-CM | POA: Insufficient documentation

## 2019-05-08 DIAGNOSIS — M25512 Pain in left shoulder: Secondary | ICD-10-CM | POA: Diagnosis not present

## 2019-05-12 ENCOUNTER — Encounter (HOSPITAL_COMMUNITY): Payer: Self-pay | Admitting: Licensed Clinical Social Worker

## 2019-05-12 ENCOUNTER — Other Ambulatory Visit: Payer: Self-pay

## 2019-05-12 ENCOUNTER — Ambulatory Visit (INDEPENDENT_AMBULATORY_CARE_PROVIDER_SITE_OTHER): Payer: Federal, State, Local not specified - PPO | Admitting: Licensed Clinical Social Worker

## 2019-05-12 DIAGNOSIS — F4310 Post-traumatic stress disorder, unspecified: Secondary | ICD-10-CM | POA: Diagnosis not present

## 2019-05-12 DIAGNOSIS — F319 Bipolar disorder, unspecified: Secondary | ICD-10-CM

## 2019-05-12 NOTE — Progress Notes (Signed)
Virtual Visit via Telephone Note  I connected with Curtis Townsend on 05/12/19 at 12:30 PM EST by telephone and verified that I am speaking with the correct person using two identifiers.   I discussed the limitations, risks, security and privacy concerns of performing an evaluation and management service by telephone and the availability of in person appointments. I also discussed with the patient that there may be a patient responsible charge related to this service. The patient expressed understanding and agreed to proceed.  Type of Therapy: Individual Therapy  Treatment Goals addressed: Improve psychiatric symptoms, elevate mood (decreased irritability, increased enjoyment of activities), Improve unhelpful thought patterns, emotional regulation skills (reduce temper outburst), Interpersonal relationship skills  Interventions: Motivational Interviewing and Other: Grounding and Mindfulness techniques  Summary: Curtis Townsend is a 43y.o. male who presents with PTSD and Bipolar I Disorder, mixed, moderate  Suicidal/Homicidal: No without intent/plan  Therapist Response:  Curtis Townsend met with clinician for individual therapy. Curtis Townsend discussed his psychiatric symptoms and current life events. Curtis Townsend sharedthat he has been okay. He reports he has also now been dx with Frozen Shoulder, which will require a steroid shot in his left shoulder. However, he reports due to current finances, he will have to wait until early December to get this done. Clinician explored other methods of pain relief and noted only ice and over the counter pain relief meds were recommended. Clinician processed coping and noted ongoing frustration and boredom from being at home and doing the same routine daily. Clinician explored options for doing something different. Curtis Townsend reported some interest in getting a part time job. Clinician discussed pros and cons of getting a job and noted that there would be a lot of  positives from going back to work, even for a few shifts per week. Curtis Townsend reports he will start more actively looking for work, as the household is tight on money and he is looking for something to do.   Plan: Return again in 1-2 weeks.  Diagnosis: Axis I: PTSD and Bipolar I Disorder, mixed, severe     I discussed the assessment and treatment plan with the patient. The patient was provided an opportunity to ask questions and all were answered. The patient agreed with the plan and demonstrated an understanding of the instructions.   The patient was advised to call back or seek an in-person evaluation if the symptoms worsen or if the condition fails to improve as anticipated.  I provided 40 minutes of non-face-to-face time during this encounter.   [name], LCSW

## 2019-05-16 ENCOUNTER — Encounter (HOSPITAL_COMMUNITY): Payer: Self-pay | Admitting: Psychiatry

## 2019-05-16 ENCOUNTER — Other Ambulatory Visit: Payer: Self-pay

## 2019-05-16 ENCOUNTER — Ambulatory Visit (INDEPENDENT_AMBULATORY_CARE_PROVIDER_SITE_OTHER): Payer: Federal, State, Local not specified - PPO | Admitting: Psychiatry

## 2019-05-16 DIAGNOSIS — F319 Bipolar disorder, unspecified: Secondary | ICD-10-CM

## 2019-05-16 DIAGNOSIS — F4310 Post-traumatic stress disorder, unspecified: Secondary | ICD-10-CM

## 2019-05-16 MED ORDER — ABILIFY MAINTENA 400 MG IM PRSY: 400.0000 mg | PREFILLED_SYRINGE | INTRAMUSCULAR | 2 refills | Status: DC
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
## 2019-05-18 NOTE — Progress Notes (Signed)
Virtual Visit via Telephone Note   This visit type was conducted due to national recommendations for restrictions regarding the COVID-19 Pandemic (e.g. social distancing) in an effort to limit this patient's exposure and mitigate transmission in our community.  Due to his co-morbid illnesses, this patient is at least at moderate risk for complications without adequate follow up.  This format is felt to be most appropriate for this patient at this time.  The patient did not have access to video technology/had technical difficulties with video requiring transitioning to audio format only (telephone).  All issues noted in this document were discussed and addressed.  No physical exam could be performed with this format.  Please refer to the patient's chart for his  consent to telehealth for [facility].   Date:  05/19/2019   ID:  Helyn Numbers, DOB [birth year], MRN [id number]  Patient Location: Home Provider Location: Home  PCP:  Janith Lima, MD  Cardiologist:  Quay Burow, MD  Electrophysiologist:  None   Evaluation Performed:  Follow-Up Visit  Chief Complaint:  6 month follow up  History of Present Illness:    Curtis Townsend is a 43 y.o. male with bipolar disorder, PTSD, and marijuana use established care with Dr. Gwenlyn Found 02/06/18 following an ER visit for chest pain. He reported atypical CP without cardiac risk factors, and palpitations. ETT was essentially normal and holter monitor revealed sinus rhythm and sinus tachycardia.   He presents today for follow up. He denies anginal symptoms and palpitations. No syncope, no recent illnesses. He does not have a BP cuff at home. He is not taking any cardiac medications.    The patient does not have symptoms concerning for COVID-19 infection (fever, chills, cough, or new shortness of breath).    Past Medical History:  Diagnosis Date  . Alcoholism ([place])   . Anxiety   . Atypical chest pain   . Bipolar 1 disorder ([place])   .  Depression   . Depression   . History of chicken pox   . History of kidney stones   . Hyperlipemia    no current med.  . IBS (irritable bowel syndrome)   . Lipoma of back 05/2012   right  . Seasonal allergies   . Wears partial dentures    upper   Past Surgical History:  Procedure Laterality Date  . LIPOMA EXCISION  06/10/2012   Procedure: EXCISION LIPOMA;  Surgeon: Ralene Ok, MD;  Location: [place];  Service: General;  Laterality: Right;  excision of back lipoma on right 3x3cm     Current Meds  Medication Sig  . ARIPiprazole ER (ABILIFY MAINTENA) 400 MG PRSY prefilled syringe Inject 400 mg into the muscle every 28 (twenty-eight) days.  Marland Kitchen ibuprofen (ADVIL,MOTRIN) 200 MG tablet Take 600 mg by mouth every 6 (six) hours as needed for headache.  . pantoprazole (PROTONIX) 40 MG tablet Take 1 tablet (40 mg total) by mouth daily.   Current Facility-Administered Medications for the 05/19/19 encounter (Telemedicine) with Ledora Bottcher, PA  Medication  . ARIPiprazole ER (ABILIFY MAINTENA) 400 MG prefilled syringe 400 mg     Allergies:   Patient has no known allergies.   Social History   Tobacco Use  . Smoking status: Light Tobacco Smoker    Packs/day: 0.10    Years: 16.00    Pack years: 1.60  . Smokeless tobacco: Never Used  . Tobacco comment: 1-2 cigarettes daily  Substance Use Topics  . Alcohol use:  Yes    Alcohol/week: 0.0 standard drinks    Comment: Occasional use  . Drug use: Yes    Comment: states daily THC- used last- more than week per pt     Family Hx: The patient's family history includes Cancer in his paternal uncle and sister; Colitis in his paternal uncle; Diabetes in his father, mother, and paternal grandmother; Heart attack in his maternal grandmother; High Cholesterol in his paternal grandmother; High blood pressure in his sister; Hyperlipidemia in his brother, brother, mother, and sister; Hypertension in his brother, brother, father,  mother, and sister; Schizophrenia in his father; Stroke in his maternal grandmother and mother. There is no history of Esophageal cancer, Rectal cancer, or Stomach cancer.  ROS:   Please see the history of present illness.     All other systems reviewed and are negative.   Prior CV studies:   The following studies were reviewed today:  ETT 02/12/18:  Blood pressure demonstrated a normal response to exercise.  No T wave inversion was noted during stress.  There was no ST segment deviation noted during stress.  The patient experienced no angina during the stress test.  Ravneet Spilker Treadmill Score: low risk   Negative stress test without evidence of ischemia at given workload.   Holter monitor 02/07/18: Sinus rhythm/sinus tachycardia.   Labs/Other Tests and Data Reviewed:    EKG:  No ECG reviewed.  Recent Labs: 03/25/2019: ALT 22; BUN 9; Creatinine, Ser 1.23; Hemoglobin 16.8; Platelets 160.0; Potassium 4.1; Sodium 142; TSH 0.96   Recent Lipid Panel Lab Results  Component Value Date/Time   CHOL 197 02/22/2016 10:54 AM   TRIG 160.0 (H) 02/22/2016 10:54 AM   HDL 45.00 02/22/2016 10:54 AM   CHOLHDL 4 02/22/2016 10:54 AM   LDLCALC 120 (H) 02/22/2016 10:54 AM    Wt Readings from Last 3 Encounters:  03/26/19 208 lb (94.3 kg)  03/25/19 208 lb 9.6 oz (94.6 kg)  07/25/18 220 lb (99.8 kg)     Objective:    Vital Signs:  Ht 6\' 1"  (1.854 m)   BMI 27.44 kg/m    VITAL SIGNS:  reviewed GEN:  no acute distress RESPIRATORY:  normal respiratory effort, symmetric expansion NEURO:  alert and oriented x 3, no obvious focal deficit PSYCH:  normal affect  ASSESSMENT & PLAN:    Atypical chest pain - ETT negative for ischemia - no further problems with chest pain   Palpitations - holter monitor without significant arrhythmia - no further palpitations, no syncope  Follow up as needed.  COVID-19 Education: The signs and symptoms of COVID-19 were discussed with the patient and how  to seek care for testing (follow up with PCP or arrange E-visit).  The importance of social distancing was discussed today.  Time:   Today, I have spent 8 minutes with the patient with telehealth technology discussing the above problems.     Medication Adjustments/Labs and Tests Ordered: Current medicines are reviewed at length with the patient today.  Concerns regarding medicines are outlined above.   Tests Ordered: No orders of the defined types were placed in this encounter.   Medication Changes: No orders of the defined types were placed in this encounter.   Follow Up:  Either In Person or Virtual prn  Signed, Ledora Bottcher, PA  05/19/2019 9:07 AM    [place]

## 2019-05-19 ENCOUNTER — Encounter: Payer: Self-pay | Admitting: Physician Assistant

## 2019-05-19 ENCOUNTER — Telehealth (INDEPENDENT_AMBULATORY_CARE_PROVIDER_SITE_OTHER): Payer: Federal, State, Local not specified - PPO | Admitting: Physician Assistant

## 2019-05-19 VITALS — Ht 73.0 in

## 2019-05-19 DIAGNOSIS — R0789 Other chest pain: Secondary | ICD-10-CM | POA: Diagnosis not present

## 2019-05-19 DIAGNOSIS — R002 Palpitations: Secondary | ICD-10-CM | POA: Diagnosis not present

## 2019-05-19 NOTE — Patient Instructions (Signed)
Medication Instructions:  Your physician recommends that you continue on your current medications as directed. Please refer to the Current Medication list given to you today.  *If you need a refill on your cardiac medications before your next appointment, please call your pharmacy*  Follow-Up: At [facility], you and your health needs are our priority.  As part of our continuing mission to provide you with exceptional heart care, we have created designated Provider Care Teams.  These Care Teams include your primary Cardiologist (physician) and Advanced Practice Providers (APPs -  Physician Assistants and Nurse Practitioners) who all work together to provide you with the care you need, when you need it.  Your next appointment:   As needed

## 2019-05-22 DIAGNOSIS — M7542 Impingement syndrome of left shoulder: Secondary | ICD-10-CM | POA: Diagnosis not present

## 2019-05-22 DIAGNOSIS — M25512 Pain in left shoulder: Secondary | ICD-10-CM | POA: Diagnosis not present

## 2019-05-26 ENCOUNTER — Other Ambulatory Visit: Payer: Self-pay

## 2019-05-26 ENCOUNTER — Encounter (HOSPITAL_COMMUNITY): Payer: Self-pay | Admitting: Licensed Clinical Social Worker

## 2019-05-26 ENCOUNTER — Ambulatory Visit (INDEPENDENT_AMBULATORY_CARE_PROVIDER_SITE_OTHER): Payer: Federal, State, Local not specified - PPO | Admitting: Licensed Clinical Social Worker

## 2019-05-26 DIAGNOSIS — F319 Bipolar disorder, unspecified: Secondary | ICD-10-CM

## 2019-05-26 DIAGNOSIS — F4310 Post-traumatic stress disorder, unspecified: Secondary | ICD-10-CM | POA: Diagnosis not present

## 2019-05-26 NOTE — Progress Notes (Signed)
Virtual Visit via Telephone Note  I connected with Curtis Townsend on 05/26/19 at 12:30 PM EST by telephone and verified that I am speaking with the correct person using two identifiers.    I Townsend the limitations, risks, security and privacy concerns of performing an evaluation and management service by telephone and the availability of in person appointments. I also Townsend with the patient that there may be a patient responsible charge related to this service. The patient expressed understanding and agreed to proceed.  Type of Therapy: Individual Therapy  Treatment Goals addressed: Improve psychiatric symptoms, elevate mood (decreased irritability, increased enjoyment of activities), Improve unhelpful thought patterns, emotional regulation skills (reduce temper outburst), Interpersonal relationship skills  Interventions: Motivational Interviewing and Other: Grounding and Mindfulness techniques  Summary: Curtis Townsend is a 43y.o. male who presents with PTSD and Bipolar I Disorder, mixed, moderate  Suicidal/Homicidal: No without intent/plan  Therapist Response:  Curtis Townsend met with clinician for individual therapy. Curtis Townsend his psychiatric symptoms and current life events. Curtis Townsend sharedthat he has been very upset and freaking out because he may lose his home. Clinician explored the situation and reflected the fear and worry about the situation. Clinician Townsend options for seeking out legal aid and provided the phone number for Legal Aid services in [place]. Clinician Townsend coping skills and noted that he has been smoking a lot of cigarettes due to stress. Clinician processed the importance of turning this fear and worry into motivation to get business done, contact a lawyer, start house hunting, reduce spending, and be supportive for wife.    Plan: Return again in 1-2 weeks.  Diagnosis: Axis I: PTSD and Bipolar I Disorder, mixed, severe     I  Townsend the assessment and treatment plan with the patient. The patient was provided an opportunity to ask questions and all were answered. The patient agreed with the plan and demonstrated an understanding of the instructions.   The patient was advised to call back or seek an in-person evaluation if the symptoms worsen or if the condition fails to improve as anticipated.  I provided 45 minutes of non-face-to-face time during this encounter.   Curtis Curling, LCSW

## 2019-06-02 DIAGNOSIS — M7542 Impingement syndrome of left shoulder: Secondary | ICD-10-CM | POA: Insufficient documentation

## 2019-06-04 ENCOUNTER — Ambulatory Visit (HOSPITAL_COMMUNITY): Payer: Federal, State, Local not specified - PPO

## 2019-06-09 ENCOUNTER — Ambulatory Visit (INDEPENDENT_AMBULATORY_CARE_PROVIDER_SITE_OTHER): Payer: Federal, State, Local not specified - PPO | Admitting: Licensed Clinical Social Worker

## 2019-06-09 ENCOUNTER — Encounter (HOSPITAL_COMMUNITY): Payer: Self-pay | Admitting: Licensed Clinical Social Worker

## 2019-06-09 ENCOUNTER — Other Ambulatory Visit: Payer: Self-pay

## 2019-06-09 DIAGNOSIS — F319 Bipolar disorder, unspecified: Secondary | ICD-10-CM | POA: Diagnosis not present

## 2019-06-09 DIAGNOSIS — F4310 Post-traumatic stress disorder, unspecified: Secondary | ICD-10-CM

## 2019-06-09 NOTE — Progress Notes (Signed)
Virtual Visit via Telephone Note  I connected with Curtis Townsend on 06/09/19 at 12:30 PM EST by telephone and verified that I am speaking with the correct person using two identifiers.    I discussed the limitations, risks, security and privacy concerns of performing an evaluation and management service by telephone and the availability of in person appointments. I also discussed with the patient that there may be a patient responsible charge related to this service. The patient expressed understanding and agreed to proceed.  Type of Therapy: Individual Therapy  Treatment Goals addressed: Improve psychiatric symptoms, elevate mood (decreased irritability, increased enjoyment of activities), Improve unhelpful thought patterns, emotional regulation skills (reduce temper outburst), Interpersonal relationship skills  Interventions: Motivational Interviewing and Other: Grounding and Mindfulness techniques  Summary: Curtis Townsend is a 43y.o. male who presents with PTSD and Bipolar I Disorder, mixed, moderate  Suicidal/Homicidal: No without intent/plan  Therapist Response:  Curtis Townsend met with clinician for individual therapy. Curtis Townsend discussed his psychiatric symptoms and current life events. Curtis Townsend sharedthat he has been doing a little better this week. He reports that he contacted the lawyer about his house and he received a year to save up and put money down on the home to buy it. Clinician identified the sense of relief that he did not have to move his family and that he would have time to ensure a stable home for his children. Clinician discussed options and identified the importance of communicating with wife about what he wants to do. Curtis Townsend reports that his wife wants to buy the house and he wants her to be happy.  Curtis Townsend reports overall things are okay. He reported on concerns about his son, who is now in speech therapy twice a week, but his behavior is not great. Clinician  normalized his bxs for a 43 year old and encouraged Curtis Townsend to work with him to keep him engaged and focused.    Plan: Return again in 1-2 weeks.  Diagnosis: Axis I: PTSD and Bipolar I Disorder, mixed, severe     I discussed the assessment and treatment plan with the patient. The patient was provided an opportunity to ask questions and all were answered. The patient agreed with the plan and demonstrated an understanding of the instructions.   The patient was advised to call back or seek an in-person evaluation if the symptoms worsen or if the condition fails to improve as anticipated.  I provided 45 minutes of non-face-to-face time during this encounter.   [name], LCSW

## 2019-06-10 ENCOUNTER — Encounter (HOSPITAL_COMMUNITY): Payer: Self-pay | Admitting: *Deleted

## 2019-06-10 ENCOUNTER — Other Ambulatory Visit: Payer: Self-pay

## 2019-06-10 ENCOUNTER — Ambulatory Visit (INDEPENDENT_AMBULATORY_CARE_PROVIDER_SITE_OTHER): Payer: Federal, State, Local not specified - PPO | Admitting: *Deleted

## 2019-06-10 DIAGNOSIS — F250 Schizoaffective disorder, bipolar type: Secondary | ICD-10-CM | POA: Diagnosis not present

## 2019-06-10 NOTE — Progress Notes (Signed)
Patient arrived today with flat affect and  mood for his injection of Abilify Maintena 400 mg. Patient is on time for his injection this month. The injection of Abilify Maintena 400 mg was prepared as ordered and administered in patientsleft deltoid area, patient tolerated well and without complaint. Patient was given an appointment for next month and will call with any questions or concerns.

## 2019-06-23 ENCOUNTER — Ambulatory Visit (INDEPENDENT_AMBULATORY_CARE_PROVIDER_SITE_OTHER): Payer: Federal, State, Local not specified - PPO | Admitting: Licensed Clinical Social Worker

## 2019-06-23 ENCOUNTER — Encounter (HOSPITAL_COMMUNITY): Payer: Self-pay | Admitting: Licensed Clinical Social Worker

## 2019-06-23 ENCOUNTER — Other Ambulatory Visit: Payer: Self-pay

## 2019-06-23 DIAGNOSIS — F4310 Post-traumatic stress disorder, unspecified: Secondary | ICD-10-CM | POA: Diagnosis not present

## 2019-06-23 DIAGNOSIS — F319 Bipolar disorder, unspecified: Secondary | ICD-10-CM | POA: Diagnosis not present

## 2019-06-23 IMAGING — CT CT HEAD W/O CM
3 series · 16 of 47 positions shown, 19 images · non-contrast
Comparison: Head CT 06/28/2012

CLINICAL DATA: Dizziness and left-sided weakness for 1 week.

EXAM:
CT HEAD WITHOUT CONTRAST
TECHNIQUE: Contiguous axial images were obtained from the base of the skull
through the vertex without intravenous contrast.

[Series 2: head wo · axial · 0.47mm/px · z∈[-249,-119]mm · 10 of 32 slices shown, 13 images]
[im 3/32  brain]
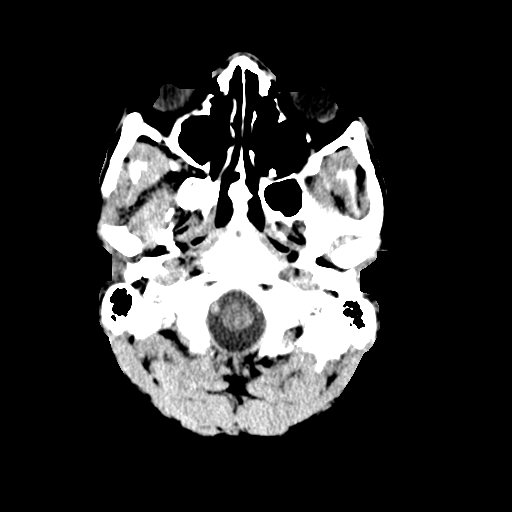
[im 3/32  bone]
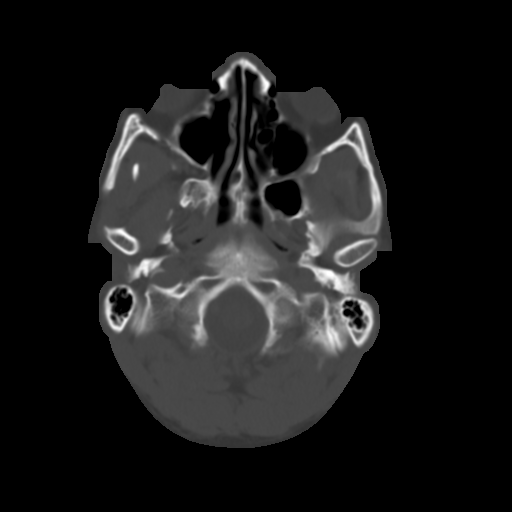
[im 6/32  brain]
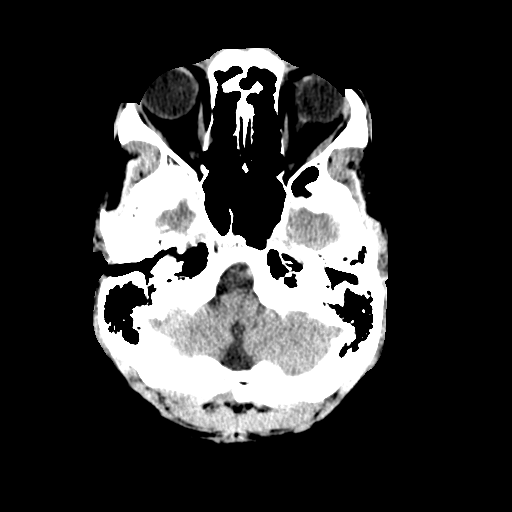
[im 9/32  brain]
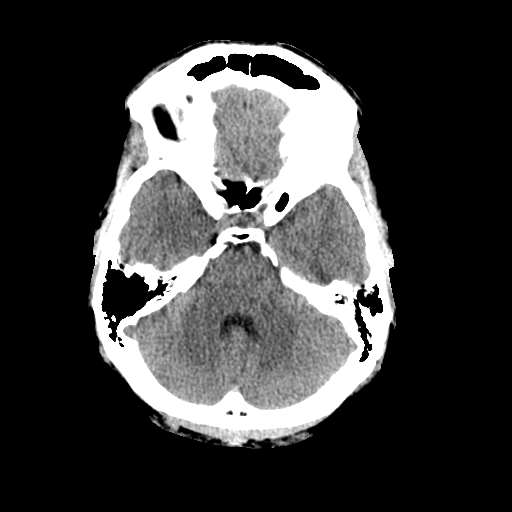
[im 11/32  brain]
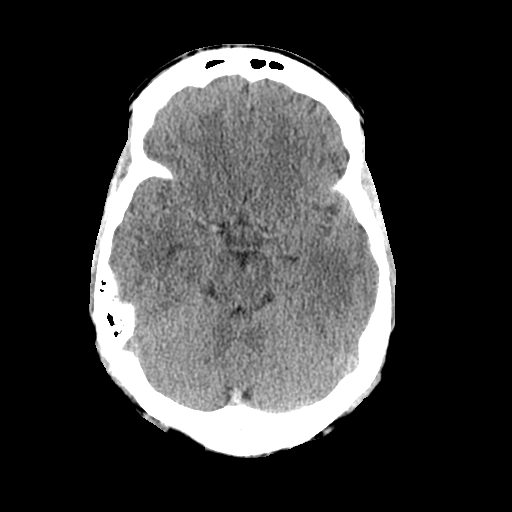
[im 14/32  brain]
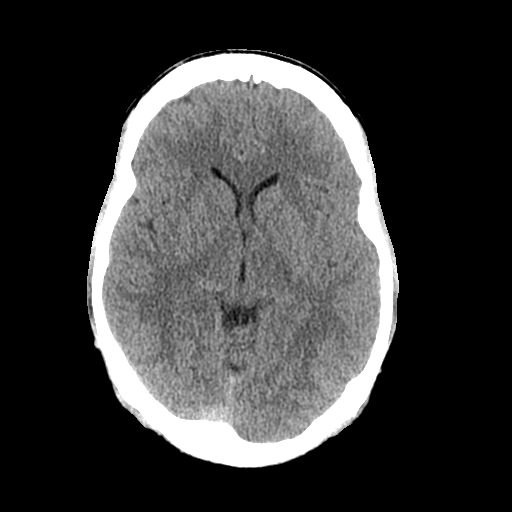
[im 14/32  bone]
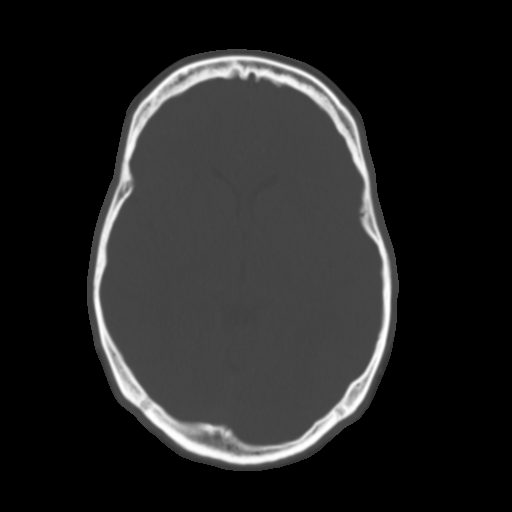
[im 18/32  brain]
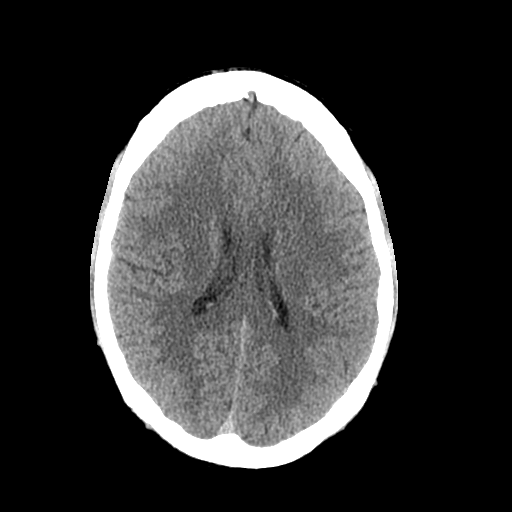
[im 21/32  brain]
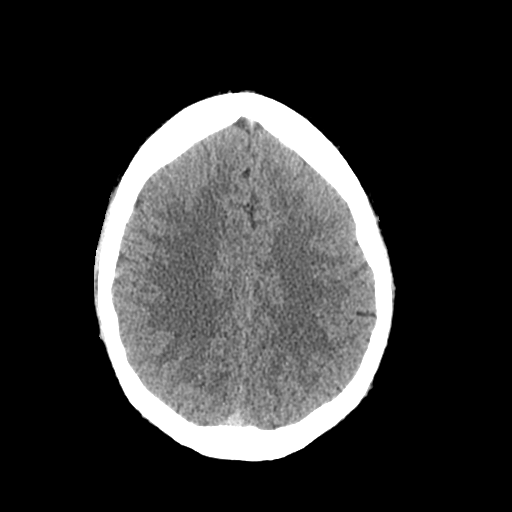
[im 24/32  brain]
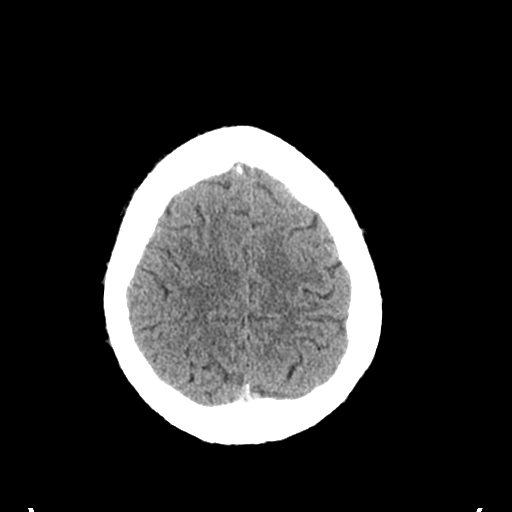
[im 26/32  brain]
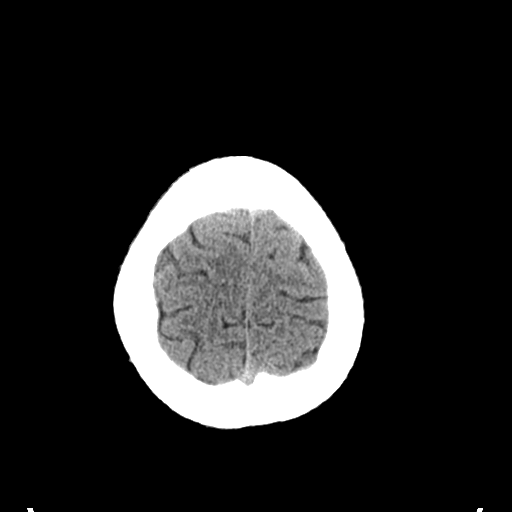
[im 26/32  bone]
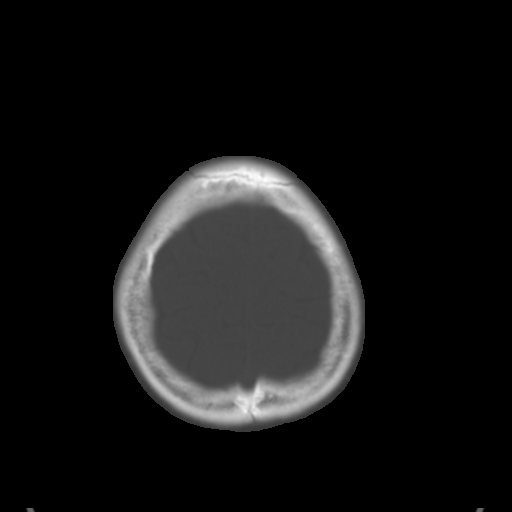
[im 29/32  brain]
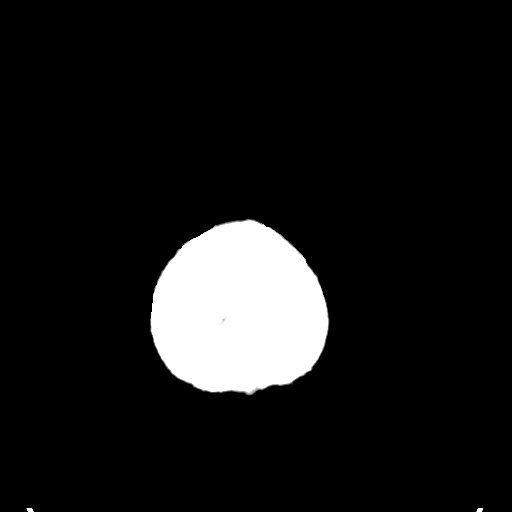

[Series 4: coronal soft tissue · coronal · 0.31mm/px · 3 of 69 slices shown]
[im 23/69  brain]
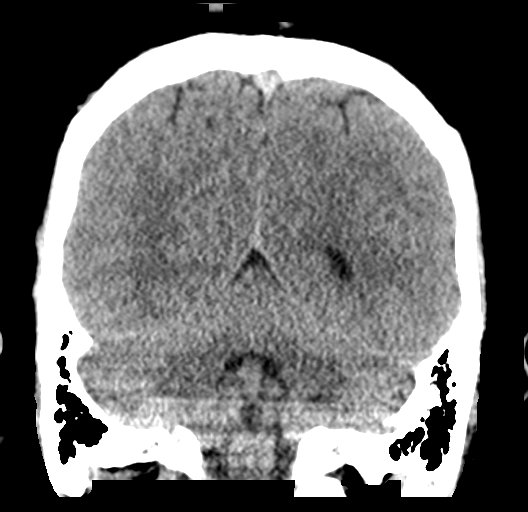
[im 31/69  brain]
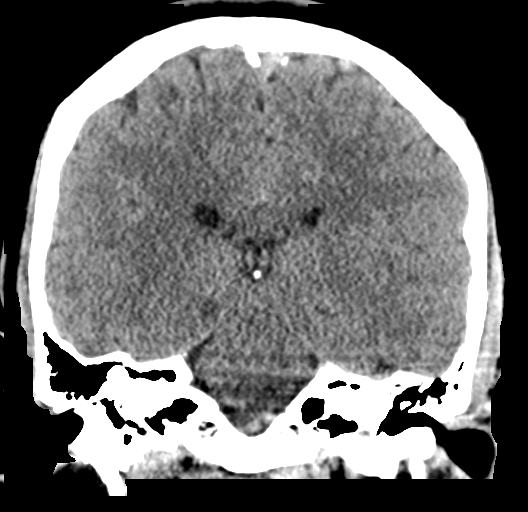
[im 38/69  brain]
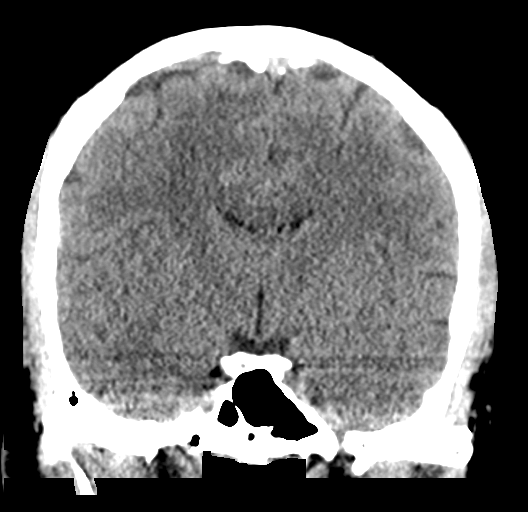

[Series 5: sagittal soft tissue · sagittal · 0.31mm/px · 3 of 55 slices shown]
[im 19/55  brain]
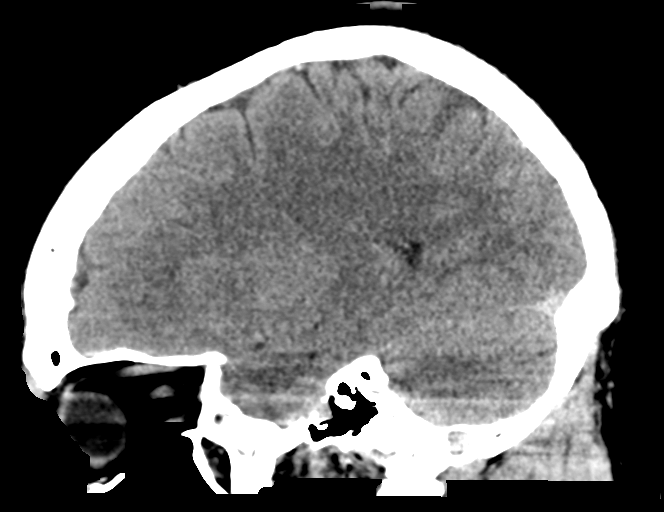
[im 28/55  brain]
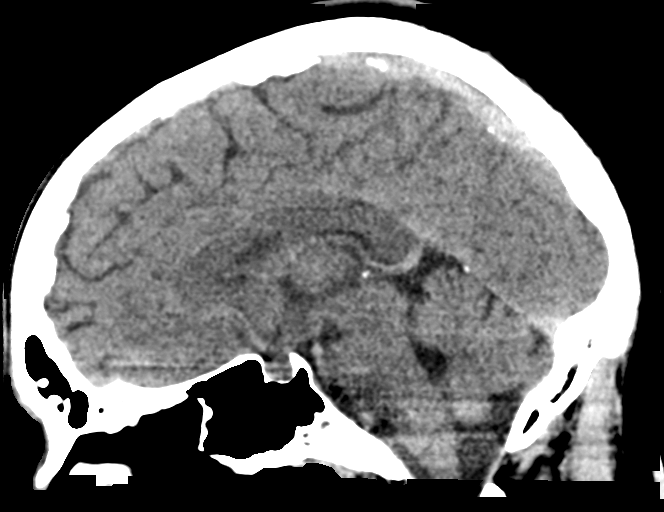
[im 37/55  brain]
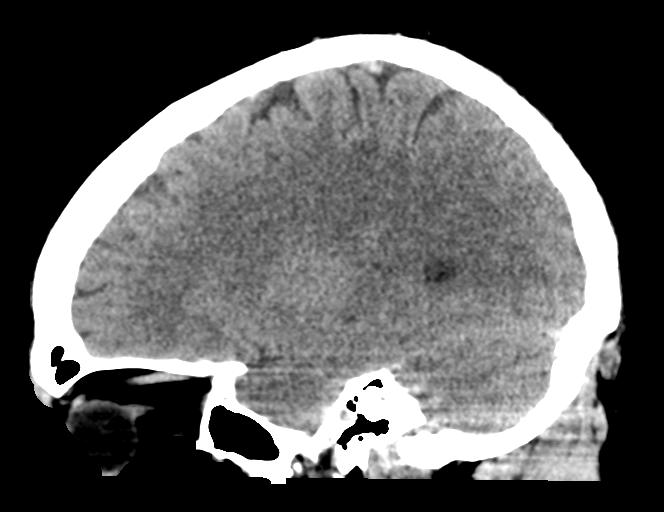

[16 of 47 positions shown; findings below may reference images not displayed]

FINDINGS: Brain: There is no mass, hemorrhage or extra-axial collection. The
size and configuration of the ventricles and extra-axial CSF spaces
are normal. There is no acute or chronic infarction. The brain
parenchyma is normal.

Vascular: No abnormal hyperdensity of the major intracranial
arteries or dural venous sinuses. No intracranial atherosclerosis.

Skull: The visualized skull base, calvarium and extracranial soft
tissues are normal.

Sinuses/Orbits: No fluid levels or advanced mucosal thickening of
the visualized paranasal sinuses. No mastoid or middle ear effusion.
The orbits are normal.
IMPRESSION: Normal head CT.

## 2019-06-23 NOTE — Progress Notes (Signed)
Virtual Visit via Telephone Note  I connected with Curtis Townsend on 06/23/19 at  2:30 PM EST by telephone and verified that I am speaking with the correct person using two identifiers.    I discussed the limitations, risks, security and privacy concerns of performing an evaluation and management service by telephone and the availability of in person appointments. I also discussed with the patient that there may be a patient responsible charge related to this service. The patient expressed understanding and agreed to proceed.  Type of Therapy: Individual Therapy  Treatment Goals addressed: Improve psychiatric symptoms, elevate mood (decreased irritability, increased enjoyment of activities), Improve unhelpful thought patterns, emotional regulation skills (reduce temper outburst), Interpersonal relationship skills  Interventions: Motivational Interviewing and Other: Grounding and Mindfulness techniques  Summary: Curtis Townsend is a 43y.o. male who presents with PTSD and Bipolar I Disorder, mixed, moderate  Suicidal/Homicidal: No without intent/plan  Therapist Response:  Curtis Townsend met with Curtis Townsend for individual therapy. Curtis Townsend discussed his psychiatric symptoms and current life events. Curtis Townsend sharedthat he has been very stressed because the check engine light came on in his car today and he is worried that he cannot afford the repairs. Curtis Townsend reflected the frustration and worry Curtis Townsend felt. Curtis Townsend utilized CBT to explore best and worst case scenarios and coping skills available to him in each situation. Curtis Townsend discussed ways that Curtis Townsend can seek out support from his family. Curtis Townsend reported that he has already decided to contact his neighbor, who is a Dealer. Curtis Townsend discussed coping skills and encouraged Curtis Townsend to practice his breathing, talking, and positive self talk.   Plan: Return again in 1-2 weeks.  Diagnosis: Axis I: PTSD and Bipolar I Disorder, mixed,  severe     I discussed the assessment and treatment plan with the patient. The patient was provided an opportunity to ask questions and all were answered. The patient agreed with the plan and demonstrated an understanding of the instructions.   The patient was advised to call back or seek an in-person evaluation if the symptoms worsen or if the condition fails to improve as anticipated.  I provided 45 minutes of non-face-to-face time during this encounter.   [name], LCSW

## 2019-07-02 ENCOUNTER — Ambulatory Visit (HOSPITAL_COMMUNITY): Payer: Federal, State, Local not specified - PPO

## 2019-07-07 ENCOUNTER — Ambulatory Visit (HOSPITAL_COMMUNITY): Payer: Federal, State, Local not specified - PPO | Admitting: Licensed Clinical Social Worker

## 2019-07-21 ENCOUNTER — Other Ambulatory Visit: Payer: Self-pay

## 2019-07-21 ENCOUNTER — Encounter (HOSPITAL_COMMUNITY): Payer: Self-pay | Admitting: Licensed Clinical Social Worker

## 2019-07-21 ENCOUNTER — Ambulatory Visit (INDEPENDENT_AMBULATORY_CARE_PROVIDER_SITE_OTHER): Payer: Federal, State, Local not specified - PPO | Admitting: Licensed Clinical Social Worker

## 2019-07-21 DIAGNOSIS — F319 Bipolar disorder, unspecified: Secondary | ICD-10-CM

## 2019-07-21 DIAGNOSIS — F4310 Post-traumatic stress disorder, unspecified: Secondary | ICD-10-CM

## 2019-07-21 NOTE — Progress Notes (Signed)
Virtual Visit via Telephone Note  I connected with Curtis Townsend on 07/21/19 at 12:30 PM EST by telephone and verified that I am speaking with the correct person using two identifiers.     I discussed the limitations, risks, security and privacy concerns of performing an evaluation and management service by telephone and the availability of in person appointments. I also discussed with the patient that there may be a patient responsible charge related to this service. The patient expressed understanding and agreed to proceed.  Type of Therapy: Individual Therapy  Treatment Goals addressed: Improve psychiatric symptoms, elevate mood (decreased irritability, increased enjoyment of activities), Improve unhelpful thought patterns, emotional regulation skills (reduce temper outburst), Interpersonal relationship skills  Interventions: Motivational Interviewing and Other: Grounding and Mindfulness techniques  Summary: Curtis Townsend is a 43y.o. male who presents with PTSD and Bipolar I Disorder, mixed, moderate  Suicidal/Homicidal: No without intent/plan  Therapist Response:  Curtis Townsend met with clinician for individual therapy. Curtis Townsend discussed his psychiatric symptoms and current life events. Curtis Townsend sharedthat has been doing better since last session. He reports things are working out better. His vehicle is going to be fixed this week, kids started back at school, and life is getting back to normal. Clinician explored coping skills and noted that even though he knows life is stressful, he feels less overwhelmed. Clinician identified the value of waiting and giving things time to work themselves out, rather than meeting these stressors with anxiety and panic.   Plan: Return again in 1-2 weeks.  Diagnosis: Axis I: PTSD and Bipolar I Disorder, mixed, severe     I discussed the assessment and treatment plan with the patient. The patient was provided an opportunity to ask questions  and all were answered. The patient agreed with the plan and demonstrated an understanding of the instructions.   The patient was advised to call back or seek an in-person evaluation if the symptoms worsen or if the condition fails to improve as anticipated.  I provided 45 minutes of non-face-to-face time during this encounter.   [name], LCSW

## 2019-07-30 ENCOUNTER — Encounter (HOSPITAL_COMMUNITY): Payer: Self-pay

## 2019-07-30 ENCOUNTER — Ambulatory Visit (INDEPENDENT_AMBULATORY_CARE_PROVIDER_SITE_OTHER): Payer: Federal, State, Local not specified - PPO | Admitting: *Deleted

## 2019-07-30 ENCOUNTER — Other Ambulatory Visit: Payer: Self-pay

## 2019-07-30 DIAGNOSIS — F250 Schizoaffective disorder, bipolar type: Secondary | ICD-10-CM | POA: Diagnosis not present

## 2019-07-30 NOTE — Progress Notes (Signed)
Pt presented today for overdue Abilify Maintena 400mg . Pt says that he's been having car trouble. Pt presented as flat, cautious, with poor eye contact, but cooperative on approach. IM given in right deltoid per pt request. Pt denies any major mood swings or AVH. Pt made appointment for nurse visit on 08/28/19. Pt encouraged to contact office with any questions or concerns.

## 2019-08-04 ENCOUNTER — Encounter (HOSPITAL_COMMUNITY): Payer: Self-pay | Admitting: Licensed Clinical Social Worker

## 2019-08-04 ENCOUNTER — Other Ambulatory Visit: Payer: Self-pay

## 2019-08-04 ENCOUNTER — Ambulatory Visit (INDEPENDENT_AMBULATORY_CARE_PROVIDER_SITE_OTHER): Payer: Federal, State, Local not specified - PPO | Admitting: Licensed Clinical Social Worker

## 2019-08-04 DIAGNOSIS — F319 Bipolar disorder, unspecified: Secondary | ICD-10-CM | POA: Diagnosis not present

## 2019-08-04 DIAGNOSIS — F4310 Post-traumatic stress disorder, unspecified: Secondary | ICD-10-CM

## 2019-08-04 NOTE — Progress Notes (Signed)
Virtual Visit via Telephone Note  I connected with Curtis Townsend on 08/04/19 at 12:30 PM EST by telephone and verified that I am speaking with the correct person using two identifiers.    I discussed the limitations, risks, security and privacy concerns of performing an evaluation and management service by telephone and the availability of in person appointments. I also discussed with the patient that there may be a patient responsible charge related to this service. The patient expressed understanding and agreed to proceed.  Type of Therapy: Individual Therapy  Treatment Goals addressed: Improve psychiatric symptoms, elevate mood (decreased irritability, increased enjoyment of activities), Improve unhelpful thought patterns, emotional regulation skills (reduce temper outburst), Interpersonal relationship skills  Interventions: Motivational Interviewing and Other: Grounding and Mindfulness techniques  Summary: Curtis Townsend is a 43y.o. male who presents with PTSD and Bipolar I Disorder, mixed, moderate  Suicidal/Homicidal: No without intent/plan  Therapist Response:  Curtis Townsend met with clinician for individual therapy. Curtis Townsend discussed his psychiatric symptoms and current life events. Curtis Townsend sharedthat has been doing better. He reports that he just got his shot, so he is feeling more stable in mood. Clinician reflected the benefits of this medication and noted overall improvement in mood and outlook on life. Curtis Townsend reports that even in stressful times, he has been able to "keep it together", without spiraling out of control. Clinician explored family interactions, noted ongoing struggles with youngest son, and processed reactions to stress. Clinician provided developmental guidance. Clinician also offered parenting supports and referral for son.   Plan: Return again in 1-2 weeks.  Diagnosis: Axis I: PTSD and Bipolar I Disorder, mixed, severe      I discussed the  assessment and treatment plan with the patient. The patient was provided an opportunity to ask questions and all were answered. The patient agreed with the plan and demonstrated an understanding of the instructions.   The patient was advised to call back or seek an in-person evaluation if the symptoms worsen or if the condition fails to improve as anticipated.  I provided 45 minutes of non-face-to-face time during this encounter.   [name], LCSW

## 2019-08-13 ENCOUNTER — Encounter (HOSPITAL_COMMUNITY): Payer: Self-pay | Admitting: Psychiatry

## 2019-08-13 ENCOUNTER — Ambulatory Visit (INDEPENDENT_AMBULATORY_CARE_PROVIDER_SITE_OTHER): Payer: Federal, State, Local not specified - PPO | Admitting: Psychiatry

## 2019-08-13 ENCOUNTER — Other Ambulatory Visit: Payer: Self-pay

## 2019-08-13 DIAGNOSIS — F319 Bipolar disorder, unspecified: Secondary | ICD-10-CM | POA: Diagnosis not present

## 2019-08-13 DIAGNOSIS — F4310 Post-traumatic stress disorder, unspecified: Secondary | ICD-10-CM

## 2019-08-13 MED ORDER — ABILIFY MAINTENA 400 MG IM PRSY: 400.0000 mg | PREFILLED_SYRINGE | INTRAMUSCULAR | 2 refills | Status: DC

## 2019-08-13 MED ORDER — HYDROXYZINE PAMOATE 25 MG PO CAPS: ORAL_CAPSULE | ORAL | 1 refills | Status: DC
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
## 2019-08-18 ENCOUNTER — Encounter (HOSPITAL_COMMUNITY): Payer: Self-pay | Admitting: Licensed Clinical Social Worker

## 2019-08-18 ENCOUNTER — Ambulatory Visit (INDEPENDENT_AMBULATORY_CARE_PROVIDER_SITE_OTHER): Payer: Federal, State, Local not specified - PPO | Admitting: Licensed Clinical Social Worker

## 2019-08-18 ENCOUNTER — Other Ambulatory Visit: Payer: Self-pay

## 2019-08-18 DIAGNOSIS — F4310 Post-traumatic stress disorder, unspecified: Secondary | ICD-10-CM | POA: Diagnosis not present

## 2019-08-18 DIAGNOSIS — F3160 Bipolar disorder, current episode mixed, unspecified: Secondary | ICD-10-CM

## 2019-08-18 NOTE — Progress Notes (Signed)
Virtual Visit via Telephone Note  I connected with Curtis Townsend on 08/18/19 at 12:30 PM EST by telephone and verified that I am speaking with the correct person using two identifiers.     I discussed the limitations, risks, security and privacy concerns of performing an evaluation and management service by telephone and the availability of in person appointments. I also discussed with the patient that there may be a patient responsible charge related to this service. The patient expressed understanding and agreed to proceed.  Type of Therapy: Individual Therapy  Treatment Goals addressed: Type of Therapy: Individual Therapy  Treatment Goals addressed: "to make sure my mood is stable and to help me cope with having to be in my house all the time". Curtis Townsend will demonstrate and report increased mood stabilization and coping skills 5 out of 7 days".    Interventions: Motivational Interviewing and Other: Grounding and Mindfulness techniques, psychoeducation  Summary: Curtis Townsend is a 44y.o. male who presents with PTSD and Bipolar I Disorder, mixed, moderate  Suicidal/Homicidal: No without intent/plan  Therapist Response:  Pasquale met with clinician for individual therapy. Curtis Townsend discussed his psychiatric symptoms and current life events. Curtis Townsend sharedthat he's been okay. He reports concerns about finances, but reported some improvement due to stimulus check coming through. Clinician explored mood stability and processed the effectiveness of medication. Clinician utilized MI OARS to reflect and summarize thoughts and feelings. Curtis Townsend reported concerns about sleep. Clinician provided psychoeducational materials via email about sleep hygiene and reviewed them. Clinician explored sleep routine and discussed ways to utilized grounding and mindfulness techniques to relax.    Plan: Return again in 1-2 weeks.  Diagnosis: Axis I: PTSD and Bipolar I Disorder, mixed,  moderate   I discussed the assessment and treatment plan with the patient. The patient was provided an opportunity to ask questions and all were answered. The patient agreed with the plan and demonstrated an understanding of the instructions.   The patient was advised to call back or seek an in-person evaluation if the symptoms worsen or if the condition fails to improve as anticipated.  I provided 45 minutes of non-face-to-face time during this encounter.   Curtis Curling, LCSW

## 2019-08-28 ENCOUNTER — Ambulatory Visit (HOSPITAL_COMMUNITY): Payer: Federal, State, Local not specified - PPO

## 2019-08-29 ENCOUNTER — Encounter (HOSPITAL_COMMUNITY): Payer: Self-pay

## 2019-08-29 ENCOUNTER — Other Ambulatory Visit: Payer: Self-pay

## 2019-08-29 ENCOUNTER — Ambulatory Visit (INDEPENDENT_AMBULATORY_CARE_PROVIDER_SITE_OTHER): Payer: Federal, State, Local not specified - PPO

## 2019-08-29 DIAGNOSIS — F319 Bipolar disorder, unspecified: Secondary | ICD-10-CM

## 2019-08-29 NOTE — Progress Notes (Signed)
Patient arrived today with flat affect and  mood for his injection. The injection of Abilify Maintena 400 mg was prepared as ordered and administered in patientsright deltoid area, patient tolerated well with a slight stinging sensation. I instructed patient to call us if feeling doesn't diminish. Patient to return in 4 weeks and will call with any questions or concerns.

## 2019-09-01 ENCOUNTER — Encounter (HOSPITAL_COMMUNITY): Payer: Self-pay | Admitting: Licensed Clinical Social Worker

## 2019-09-01 ENCOUNTER — Ambulatory Visit (INDEPENDENT_AMBULATORY_CARE_PROVIDER_SITE_OTHER): Payer: Federal, State, Local not specified - PPO | Admitting: Licensed Clinical Social Worker

## 2019-09-01 ENCOUNTER — Other Ambulatory Visit: Payer: Self-pay

## 2019-09-01 DIAGNOSIS — F3160 Bipolar disorder, current episode mixed, unspecified: Secondary | ICD-10-CM

## 2019-09-01 NOTE — Progress Notes (Signed)
Virtual Visit via Telephone Note  I connected with Curtis Townsend on 09/01/19 at 12:30 PM EST by telephone and verified that I am speaking with the correct person using two identifiers.    I discussed the limitations, risks, security and privacy concerns of performing an evaluation and management service by telephone and the availability of in person appointments. I also discussed with the patient that there may be a patient responsible charge related to this service. The patient expressed understanding and agreed to proceed.  Type of Therapy: Individual Therapy  Treatment Goals addressed: Type of Therapy: Individual Therapy  Treatment Goals addressed: "to make sure my mood is stable and to help me cope with having to be in my house all the time". Curtis Townsend will demonstrate and report increased mood stabilization and coping skills 5 out of 7 days".    Interventions: Motivational Interviewing and CBT  Summary: Curtis Townsend is a 43y.o. male who presents with Bipolar I Disorder, mixed, moderate  Suicidal/Homicidal: No without intent/plan  Therapist Response:  Curtis Townsend met with clinician for individual therapy. Curtis Townsend discussed his psychiatric symptoms and current life events. Curtis Townsend sharedthat he continues to feel okay. He reports he has not been doing much other than normal activities with kids at home. Clinician utilized MI OARS to reflect and summarize thoughts and feelings about this new normal life he is experiencing. Clinician discussed the importance of focusing on the here and now, what he can control, rather than what he cannot do. Curtis Townsend agreed noting that he does feel more depressed when he thinks about all the things he can't do anymore. Clinician processed choices, noting that he could go to the movies, out to eat, to the mall, etc, but due to concerns of safety for his mother and his children, he chooses not to. Clinician identified the importance of reminding himself  that he is making these choices for safety. Clinician utilized CBT to address thought processes. Clinician provided thought stopping tools, as well as reality testing to provide support and confidence in his decisions.   Plan: Return again in 1-2 weeks.  Diagnosis: Axis I: Bipolar I Disorder, mixed, moderate     I discussed the assessment and treatment plan with the patient. The patient was provided an opportunity to ask questions and all were answered. The patient agreed with the plan and demonstrated an understanding of the instructions.   The patient was advised to call back or seek an in-person evaluation if the symptoms worsen or if the condition fails to improve as anticipated.  I provided 45 minutes of non-face-to-face time during this encounter.   [name], LCSW

## 2019-09-12 DIAGNOSIS — M7542 Impingement syndrome of left shoulder: Secondary | ICD-10-CM | POA: Diagnosis not present

## 2019-09-17 ENCOUNTER — Ambulatory Visit (INDEPENDENT_AMBULATORY_CARE_PROVIDER_SITE_OTHER): Payer: Federal, State, Local not specified - PPO | Admitting: Licensed Clinical Social Worker

## 2019-09-17 ENCOUNTER — Other Ambulatory Visit: Payer: Self-pay

## 2019-09-17 ENCOUNTER — Encounter (HOSPITAL_COMMUNITY): Payer: Self-pay | Admitting: Licensed Clinical Social Worker

## 2019-09-17 DIAGNOSIS — F3160 Bipolar disorder, current episode mixed, unspecified: Secondary | ICD-10-CM | POA: Diagnosis not present

## 2019-09-17 NOTE — Progress Notes (Signed)
Virtual Visit via Telephone Note  I connected with Curtis Townsend on 09/17/19 at 12:30 PM EST by telephone and verified that I am speaking with the correct person using two identifiers.     I discussed the limitations, risks, security and privacy concerns of performing an evaluation and management service by telephone and the availability of in person appointments. I also discussed with the patient that there may be a patient responsible charge related to this service. The patient expressed understanding and agreed to proceed.   Type of Therapy: Individual Therapy  Treatment Goals addressed: Type of Therapy: Individual Therapy  Treatment Goals addressed:"to make sure my mood is stable and to help me cope with having to be in my house all the time". Curtis Townsend will demonstrate and report increased mood stabilization and coping skills 5 out of 7 days".   Interventions: Motivational Interviewing and CBT  Summary: Curtis Townsend is a 43y.o. male who presents with Bipolar I Disorder, mixed, moderate  Suicidal/Homicidal: No without intent/plan  Therapist Response:  Curtis Townsend met with clinician for individual therapy. Curtis Townsend discussed his psychiatric symptoms and current life events. Curtis Townsend shared that he is feeling down and worried about his son. Clinician utilized MI OARS to reflect and summarize thoughts and feelings. Clinician identified that the developmental delays and possible autism diagnoses are not death sentences and that there is treatment available for him that will help him live a full and meaningful life. Clinician provided psychoeducation about resources that will be helpful, such as [place] [employer], as well as reaching out for ABA therapy, speech, OT, and sensory therapy. Clinician Processed Curtis Townsend's thoughts and feelings using CBT. Clinician validated thoughts and feelings, also noting that this was not something that Curtis Townsend did to his son. Clinician noted positives  about early intervention and identified the importance of having an answer to all of these questions about his behaviors and problems.   Plan: Return again in 1-2 weeks.  Diagnosis: Axis I: Bipolar I Disorder, mixed,moderate     I discussed the assessment and treatment plan with the patient. The patient was provided an opportunity to ask questions and all were answered. The patient agreed with the plan and demonstrated an understanding of the instructions.   The patient was advised to call back or seek an in-person evaluation if the symptoms worsen or if the condition fails to improve as anticipated.  I provided 45 minutes of non-face-to-face time during this encounter.   [name], LCSW

## 2019-09-23 ENCOUNTER — Ambulatory Visit (HOSPITAL_COMMUNITY): Payer: Federal, State, Local not specified - PPO

## 2019-09-26 ENCOUNTER — Ambulatory Visit (INDEPENDENT_AMBULATORY_CARE_PROVIDER_SITE_OTHER): Payer: Federal, State, Local not specified - PPO

## 2019-09-26 ENCOUNTER — Encounter (HOSPITAL_COMMUNITY): Payer: Self-pay

## 2019-09-26 ENCOUNTER — Other Ambulatory Visit: Payer: Self-pay

## 2019-09-26 DIAGNOSIS — F319 Bipolar disorder, unspecified: Secondary | ICD-10-CM | POA: Diagnosis not present

## 2019-09-26 NOTE — Progress Notes (Signed)
Patient arrived today with flat affect and mood for his injection. Presented with no suicidal ideation, hallucinations, or increased anxiety. The injection of Abilify Maintena 400 mg was prepared as ordered and administered in patientsright deltoidarea per patient request, patient tolerated well with a slight stinging sensation. I instructed patient to call us if feeling doesn't diminish. Patient to return in 4 weeks and will call with any questions or concerns

## 2019-09-29 ENCOUNTER — Ambulatory Visit (INDEPENDENT_AMBULATORY_CARE_PROVIDER_SITE_OTHER): Payer: Federal, State, Local not specified - PPO | Admitting: Licensed Clinical Social Worker

## 2019-09-29 ENCOUNTER — Encounter (HOSPITAL_COMMUNITY): Payer: Self-pay | Admitting: Licensed Clinical Social Worker

## 2019-09-29 ENCOUNTER — Other Ambulatory Visit: Payer: Self-pay

## 2019-09-29 DIAGNOSIS — F3160 Bipolar disorder, current episode mixed, unspecified: Secondary | ICD-10-CM | POA: Diagnosis not present

## 2019-09-29 NOTE — Progress Notes (Signed)
Virtual Visit via Telephone Note  I connected with Curtis Townsend on 09/29/19 at 12:30 PM EDT by telephone and verified that I am speaking with the correct person using two identifiers.  I discussed the limitations, risks, security and privacy concerns of performing an evaluation and management service by telephone and the availability of in person appointments. I also discussed with the patient that there may be a patient responsible charge related to this service. The patient expressed understanding and agreed to proceed.   Type of Therapy: Individual Therapy   Treatment Goals addressed: Type of Therapy: Individual Therapy   Treatment Goals addressed: "to make sure my mood is stable and to help me cope with having to be in my house all the time". Curtis Townsend will demonstrate and report increased mood stabilization and coping skills 5 out of 7 days".      Interventions: Motivational Interviewing and CBT   Summary: Curtis Townsend is a 43 y.o. male who presents with Bipolar I Disorder, mixed, moderate   Suicidal/Homicidal: No without intent/plan   Therapist Response:   Curtis Townsend met with clinician for individual therapy. Curtis Townsend discussed his psychiatric symptoms and current life events. Curtis Townsend shared that he is currently stressed and worried about his sister who was dx COVID-19 positive this week. Clinician explored her sxs and severity. Clinician provided feedback about her possibly getting the antibody infusion through her doctor. Clinician discussed Curtis Townsend's thoughts and feelings, worries because this is his only sister, as well as concerns that she lives alone. Clinician utilized MI OARS to reflect feelings of hopelessness and sadness, as well as worry. Clinician explored ways that he can be supportive from afar. Clinician touched base on children, interactions within the home, and feelings about the kids going back to school. Clinician also identified some starting interventions for son  who may be on the Autism Spectrum, such as identifying a routine and structure to the day.    Plan: Return again in 1-2 weeks.   Diagnosis: Axis I: Bipolar I Disorder, mixed, moderate       I discussed the assessment and treatment plan with the patient. The patient was provided an opportunity to ask questions and all were answered. The patient agreed with the plan and demonstrated an understanding of the instructions.   The patient was advised to call back or seek an in-person evaluation if the symptoms worsen or if the condition fails to improve as anticipated.  I provided 40 minutes of non-face-to-face time during this encounter.    R , LCSW  

## 2019-10-06 ENCOUNTER — Encounter: Payer: Federal, State, Local not specified - PPO | Admitting: Internal Medicine

## 2019-10-06 DIAGNOSIS — Z0289 Encounter for other administrative examinations: Secondary | ICD-10-CM

## 2019-10-06 DIAGNOSIS — H40013 Open angle with borderline findings, low risk, bilateral: Secondary | ICD-10-CM | POA: Diagnosis not present

## 2019-10-13 ENCOUNTER — Encounter (HOSPITAL_COMMUNITY): Payer: Self-pay | Admitting: Licensed Clinical Social Worker

## 2019-10-13 ENCOUNTER — Telehealth (HOSPITAL_COMMUNITY): Payer: Self-pay | Admitting: *Deleted

## 2019-10-13 ENCOUNTER — Other Ambulatory Visit: Payer: Self-pay

## 2019-10-13 ENCOUNTER — Ambulatory Visit (INDEPENDENT_AMBULATORY_CARE_PROVIDER_SITE_OTHER): Payer: Federal, State, Local not specified - PPO | Admitting: Licensed Clinical Social Worker

## 2019-10-13 DIAGNOSIS — F3160 Bipolar disorder, current episode mixed, unspecified: Secondary | ICD-10-CM | POA: Diagnosis not present

## 2019-10-13 NOTE — Telephone Encounter (Signed)
Received  Message from pt therapist, [name], stating that pt has been c/o decreased slepp despite increasing his Vistaril to 50mg  qhs. Please review and advise.

## 2019-10-13 NOTE — Progress Notes (Signed)
Virtual Visit via Telephone Note  I connected with Curtis Townsend on 10/13/19 at 12:30 PM EDT by telephone and verified that I am speaking with the correct person using two identifiers.    I discussed the limitations, risks, security and privacy concerns of performing an evaluation and management service by telephone and the availability of in person appointments. I also discussed with the patient that there may be a patient responsible charge related to this service. The patient expressed understanding and agreed to proceed.  Treatment Goals addressed: Type of Therapy: Individual Therapy   Treatment Goals addressed: "to make sure my mood is stable and to help me cope with having to be in my house all the time". Curtis Townsend will demonstrate and report increased mood stabilization and coping skills 5 out of 7 days".   Interventions: CBT   Summary: Curtis Townsend is a 44 y.o. male who presents with Bipolar I Disorder, mixed, moderate   Suicidal/Homicidal: No without intent/plan   Therapist Response:   Curtis Townsend met with clinician for individual therapy. Curtis Townsend discussed his psychiatric symptoms and current life events. Curtis Townsend shared that he got "bad news" about his eyes and discussed updates in his treatment for glaucoma. Clinician researched and provided information online about treatment for glaucoma and encouraged Curtis Townsend to do his own research about treatment, supplements, vitamins, and ways to improve his eye health. Clinician explored thoughts and feelings about having surgery using CBT. Clinician explored costs/benefits of seeking out treatment and possibly having surgery. Clinician also explored options for getting a second opinion.  Curtis Townsend processed concerns about having to also buy a new car, as his is not working anymore. Clinician discussed options for looking into the future with excitement, rather than anxiety. Clinician utilized CBT to identify thought changing strategies and to  consciously decide to increase his optimism.    Plan: Return again in 1-2 weeks.   Diagnosis: Axis I: Bipolar I Disorder, mixed, moderate      I discussed the assessment and treatment plan with the patient. The patient was provided an opportunity to ask questions and all were answered. The patient agreed with the plan and demonstrated an understanding of the instructions.   The patient was advised to call back or seek an in-person evaluation if the symptoms worsen or if the condition fails to improve as anticipated.  I provided 45 minutes of non-face-to-face time during this encounter.   [name], LCSW

## 2019-10-14 NOTE — Telephone Encounter (Signed)
He can try trazodone 50 mg at bedtime. please call if he agrees.

## 2019-10-15 ENCOUNTER — Telehealth (HOSPITAL_COMMUNITY): Payer: Self-pay | Admitting: *Deleted

## 2019-10-15 ENCOUNTER — Other Ambulatory Visit (HOSPITAL_COMMUNITY): Payer: Self-pay | Admitting: *Deleted

## 2019-10-15 MED ORDER — TRAZODONE HCL 50 MG PO TABS: 50.0000 mg | ORAL_TABLET | Freq: Every day | ORAL | 0 refills | Status: DC

## 2019-10-15 NOTE — Telephone Encounter (Signed)
Pt agrees to take Trazodone 50mg  qhs. Rx sent to [facility] on [address]. Pharmacy confirmed by pt. Pt encouraged to call clinic with any questions or concerns. Pt verbalizes understanding.

## 2019-10-27 ENCOUNTER — Encounter (HOSPITAL_COMMUNITY): Payer: Self-pay | Admitting: Licensed Clinical Social Worker

## 2019-10-27 ENCOUNTER — Ambulatory Visit (INDEPENDENT_AMBULATORY_CARE_PROVIDER_SITE_OTHER): Payer: Federal, State, Local not specified - PPO | Admitting: Licensed Clinical Social Worker

## 2019-10-27 ENCOUNTER — Ambulatory Visit (HOSPITAL_COMMUNITY): Payer: Federal, State, Local not specified - PPO

## 2019-10-27 ENCOUNTER — Other Ambulatory Visit: Payer: Self-pay

## 2019-10-27 DIAGNOSIS — F3160 Bipolar disorder, current episode mixed, unspecified: Secondary | ICD-10-CM

## 2019-10-27 NOTE — Progress Notes (Signed)
Virtual Visit via Telephone Note  I connected with Curtis Townsend on 10/27/19 at 12:30 PM EDT by telephone and verified that I am speaking with the correct person using two identifiers.    I discussed the limitations, risks, security and privacy concerns of performing an evaluation and management service by telephone and the availability of in person appointments. I also discussed with the patient that there may be a patient responsible charge related to this service. The patient expressed understanding and agreed to proceed.  Type of Therapy: Individual Therapy   Treatment Goals addressed: "to make sure my mood is stable and to help me cope with having to be in my house all the time". Curtis Townsend will demonstrate and report increased mood stabilization and coping skills 5 out of 7 days".    Interventions: CBT   Summary: Curtis Townsend is a 44 y.o. male who presents with Bipolar I Disorder, mixed, moderate   Suicidal/Homicidal: No without intent/plan   Therapist Response:   Curtis Townsend met with clinician for individual therapy. Curtis Townsend discussed his psychiatric symptoms and current life events. Curtis Townsend shared that he has not been that great this week. Clinician explored triggers to low mood and worry using CBT. Clinician discussed immediate mood and ways to change his thoughts about these incidents, in order to adjust his reactions. Clinician provided developmental guidance and some parenting support for Curtis Townsend to address some concerns about his daughter. Clinician also reassured him that it was not "his fault" if his daughter is questioning her sexuality. Clinician normalized this and provided support about his own feelings, as well as discussed ways to be accepting and supportive of his daughter.    Plan: Return again in 1-2 weeks.   Diagnosis: Axis I: Bipolar I Disorder, mixed, moderate     I discussed the assessment and treatment plan with the patient. The patient was provided an  opportunity to ask questions and all were answered. The patient agreed with the plan and demonstrated an understanding of the instructions.   The patient was advised to call back or seek an in-person evaluation if the symptoms worsen or if the condition fails to improve as anticipated.  I provided 45 minutes of non-face-to-face time during this encounter.   [name], LCSW

## 2019-10-29 DIAGNOSIS — H401131 Primary open-angle glaucoma, bilateral, mild stage: Secondary | ICD-10-CM | POA: Diagnosis not present

## 2019-10-31 ENCOUNTER — Ambulatory Visit (INDEPENDENT_AMBULATORY_CARE_PROVIDER_SITE_OTHER): Payer: Federal, State, Local not specified - PPO | Admitting: *Deleted

## 2019-10-31 ENCOUNTER — Other Ambulatory Visit: Payer: Self-pay

## 2019-10-31 ENCOUNTER — Telehealth (HOSPITAL_COMMUNITY): Payer: Self-pay | Admitting: *Deleted

## 2019-10-31 DIAGNOSIS — F319 Bipolar disorder, unspecified: Secondary | ICD-10-CM | POA: Diagnosis not present

## 2019-10-31 NOTE — Telephone Encounter (Signed)
He can try taking 75-100 mg at bed time.

## 2019-10-31 NOTE — Telephone Encounter (Signed)
Pt in today for Abilify injection. Writer asked how the Trazodone was helping with sleep and pt replied "it's not". Can we increase dosage? Please review and advise.

## 2019-10-31 NOTE — Telephone Encounter (Signed)
Pt spoke with pt to relay that Dr says he can take 75-100mg  of the Trazodone and see if that helps sleep. Pt verbalizes understanding.

## 2019-10-31 NOTE — Progress Notes (Addendum)
Pt presents today for due Abilify Maintena 400mg  injection. Pt affect flat, eye contact brief, cautious on approach. Writer asked how Trazodone 50mg  was working pt responded "it's not". Injection given in right deltoid per pt request, without c/o. Pt to return in 4 weeks for next due injection.

## 2019-11-10 ENCOUNTER — Other Ambulatory Visit: Payer: Self-pay

## 2019-11-10 ENCOUNTER — Encounter (HOSPITAL_COMMUNITY): Payer: Self-pay | Admitting: Psychiatry

## 2019-11-10 ENCOUNTER — Telehealth (INDEPENDENT_AMBULATORY_CARE_PROVIDER_SITE_OTHER): Payer: Federal, State, Local not specified - PPO | Admitting: Psychiatry

## 2019-11-10 VITALS — Wt 194.0 lb

## 2019-11-10 DIAGNOSIS — F4310 Post-traumatic stress disorder, unspecified: Secondary | ICD-10-CM | POA: Diagnosis not present

## 2019-11-10 DIAGNOSIS — F319 Bipolar disorder, unspecified: Secondary | ICD-10-CM | POA: Diagnosis not present

## 2019-11-10 MED ORDER — TRAZODONE HCL 50 MG PO TABS: ORAL_TABLET | ORAL | 1 refills | Status: DC

## 2019-11-10 MED ORDER — ABILIFY MAINTENA 400 MG IM PRSY: 400.0000 mg | PREFILLED_SYRINGE | INTRAMUSCULAR | 2 refills | Status: DC

## 2019-11-10 NOTE — Progress Notes (Signed)
Virtual Visit via Telephone Note  I connected with Curtis Townsend on 11/10/19 at  9:40 AM EDT by telephone and verified that I am speaking with the correct person using two identifiers.   I discussed the limitations, risks, security and privacy concerns of performing an evaluation and management service by telephone and the availability of in person appointments. I also discussed with the patient that there may be a patient responsible charge related to this service. The patient expressed understanding and agreed to proceed.   History of Present Illness: Patient is evaluated by phone session.  He was complaining of poor sleep and we started him on trazodone.  He is taking 50 mg and sometimes he takes a second dose to help his sleep.  He is sleeping at least 7 hours.  He is no longer taking hydroxyzine.  He is in therapy with [name].  He has nightmares and flashbacks are not as bad.  His relationship with his wife is good.  He denies any highs and lows, mania, psychosis or any hallucination.  He is getting Abilify injection every 28 days and reported it is helping his mood.  His weight is stable.  He has no tremors or shakes or any EPS.  He like to continue his current medication.  Past Psychiatric History:Reviewed. H/Oabuse, anger, paranoia, ADHD and poor impulse control. H/Osuicidal attempt as tried to hang himself and sister saved him. H/Omultiple inpatient treatment and last inpatient in September 2019. H/O THC use. TookProzac, Risperdal,Risperdal, Depakote, hydroxyzine and Depakotebut discontinued due to noncompliance.   Psychiatric Specialty Exam: Physical Exam  Review of Systems  Weight 194 lb (88 kg).Body mass index is 25.6 kg/m.  General Appearance: NA  Eye Contact:  NA  Speech:  Slow  Volume:  Normal  Mood:  Euthymic  Affect:  NA  Thought Process:  Descriptions of Associations: Intact  Orientation:  Full (Time, Place, and Person)  Thought Content:  WDL  Suicidal  Thoughts:  No  Homicidal Thoughts:  No  Memory:  Immediate;   Good Recent;   Good Remote;   Good  Judgement:  Intact  Insight:  Present  Psychomotor Activity:  NA  Concentration:  Concentration: Good and Attention Span: Good  Recall:  Good  Fund of Knowledge:  Good  Language:  Good  Akathisia:  No  Handed:  Right  AIMS (if indicated):     Assets:  Communication Skills Desire for Improvement Housing Resilience Social Support  ADL's:  Intact  Cognition:  WNL  Sleep:   7 hrs      Assessment and Plan: PTSD.  Bipolar disorder type I.  Discontinue hydroxyzine since patient is no longer taking it.  Continue Abilify maintain a injection 400 mg I/M every 28 days and trazodone 50-100 mg at bedtime.  Continue therapy with [name].  Recommended to call us back if he has any questions or any concerns.  Discussed medication side effects and benefits.  Follow-up in 3 months.  Follow Up Instructions:    I discussed the assessment and treatment plan with the patient. The patient was provided an opportunity to ask questions and all were answered. The patient agreed with the plan and demonstrated an understanding of the instructions.   The patient was advised to call back or seek an in-person evaluation if the symptoms worsen or if the condition fails to improve as anticipated.  I provided 15 minutes of non-face-to-face time during this encounter.   [name], MD

## 2019-11-28 ENCOUNTER — Ambulatory Visit (INDEPENDENT_AMBULATORY_CARE_PROVIDER_SITE_OTHER): Payer: Federal, State, Local not specified - PPO | Admitting: *Deleted

## 2019-11-28 ENCOUNTER — Other Ambulatory Visit: Payer: Self-pay

## 2019-11-28 DIAGNOSIS — F250 Schizoaffective disorder, bipolar type: Secondary | ICD-10-CM

## 2019-11-28 NOTE — Progress Notes (Signed)
Pt presents to clinic today for due Abilify Maintena 400 mg. Pt flat with brief eye contact primarily then will be glaring at times. Pt is cooperative. Injection given in right deltoid without c/o. Pt denies any mood lability or avh since last injection. Pt will return in approximately one month for next due injection.

## 2019-12-04 ENCOUNTER — Other Ambulatory Visit: Payer: Self-pay

## 2019-12-04 ENCOUNTER — Ambulatory Visit (INDEPENDENT_AMBULATORY_CARE_PROVIDER_SITE_OTHER): Payer: Federal, State, Local not specified - PPO | Admitting: Licensed Clinical Social Worker

## 2019-12-04 ENCOUNTER — Encounter (HOSPITAL_COMMUNITY): Payer: Self-pay | Admitting: Licensed Clinical Social Worker

## 2019-12-04 DIAGNOSIS — F3162 Bipolar disorder, current episode mixed, moderate: Secondary | ICD-10-CM | POA: Diagnosis not present

## 2019-12-04 NOTE — Progress Notes (Signed)
Virtual Visit via Telephone Note  I connected with Curtis Townsend on 12/04/19 at 11:00 AM EDT by telephone and verified that I am speaking with the correct person using two identifiers.  Location: Patient: home Provider: office   I discussed the limitations, risks, security and privacy concerns of performing an evaluation and management service by telephone and the availability of in person appointments. I also discussed with the patient that there may be a patient responsible charge related to this service. The patient expressed understanding and agreed to proceed.   Type of Therapy: Individual Therapy   Treatment Goals addressed: "to make sure my mood is stable and to help me cope with having to be in my house all the time". Curtis Townsend will demonstrate and report increased mood stabilization and coping skills 5 out of 7 days".    Interventions: CBT   Summary: Curtis Townsend is a 44 y.o. male who presents with Bipolar I Disorder, mixed, moderate   Suicidal/Homicidal: No without intent/plan   Therapist Response:   Curtis Townsend met with clinician for individual therapy. Om discussed his psychiatric symptoms and current life events. Curtis Townsend shared that he has been more anxious lately following his mother's heart attack in early May. Clinician processed the event using CBT. Clinician discussed thoughts, feelings, and behaviors. Clinician explored panic sxs and identified physical impact of heart palpitations and trouble breathing, as well as thought processes. Clinician explored coping skills and identified that daughter assisted Curtis Townsend in calming down and handling the crisis. Clinician discussed ways to manage anxiety, including cold water on the face, deep breathing, quiet, and connection with someone safe.  Clinician explored interactions and updates on the family. Clinician discussed sleep and noted that increase in Trazodone helps sometimes, but Curtis Townsend and wife believe anxiety is a big  part in sleep problems. Clinician encouraged Curtis Townsend to keep track of sleep disturbance and will update at next session if anxiety medication needs to be added by [name].  Plan: Return again in 1-2 weeks.   Diagnosis: Axis I: Bipolar I Disorder, mixed, moderate      I discussed the assessment and treatment plan with the patient. The patient was provided an opportunity to ask questions and all were answered. The patient agreed with the plan and demonstrated an understanding of the instructions.   The patient was advised to call back or seek an in-person evaluation if the symptoms worsen or if the condition fails to improve as anticipated.  I provided 45 minutes of non-face-to-face time during this encounter.   [name], LCSW

## 2019-12-06 ENCOUNTER — Other Ambulatory Visit (HOSPITAL_COMMUNITY): Payer: Self-pay | Admitting: Psychiatry

## 2019-12-06 DIAGNOSIS — F4310 Post-traumatic stress disorder, unspecified: Secondary | ICD-10-CM

## 2019-12-10 DIAGNOSIS — H401131 Primary open-angle glaucoma, bilateral, mild stage: Secondary | ICD-10-CM | POA: Diagnosis not present

## 2019-12-15 ENCOUNTER — Other Ambulatory Visit: Payer: Self-pay

## 2019-12-15 ENCOUNTER — Encounter (HOSPITAL_COMMUNITY): Payer: Self-pay | Admitting: Licensed Clinical Social Worker

## 2019-12-15 ENCOUNTER — Ambulatory Visit (INDEPENDENT_AMBULATORY_CARE_PROVIDER_SITE_OTHER): Payer: Federal, State, Local not specified - PPO | Admitting: Licensed Clinical Social Worker

## 2019-12-15 DIAGNOSIS — F3162 Bipolar disorder, current episode mixed, moderate: Secondary | ICD-10-CM

## 2019-12-15 NOTE — Progress Notes (Signed)
Virtual Visit via Telephone Note  I connected with Curtis Townsend on 12/15/19 at 12:30 PM EDT by telephone and verified that I am speaking with the correct person using two identifiers.  Location: Patient: home Provider: office   I discussed the limitations, risks, security and privacy concerns of performing an evaluation and management service by telephone and the availability of in person appointments. I also discussed with the patient that there may be a patient responsible charge related to this service. The patient expressed understanding and agreed to proceed.   Type of Therapy: Individual Therapy   Treatment Goals addressed: "to make sure my mood is stable and to help me cope with having to be in my house all the time". Curtis Townsend will demonstrate and report increased mood stabilization and coping skills 5 out of 7 days".    Interventions: Supportive therapy/grief   Summary: Curtis Townsend is a 44 y.o. male who presents with Bipolar I Disorder, mixed, moderate   Suicidal/Homicidal: No without intent/plan   Therapist Response:   Curtis Townsend met with clinician for individual therapy. Teri discussed his psychiatric symptoms and current life events. Curtis Townsend reports that today is his sister's birthday. Clinician processed grief with Curtis Townsend over the loss of his sister, who was his closest support, 16 years ago. Clinician provided supportive counseling and discussed ways to reclaim the day as a celebration of her life. Clinician explored memories of his sister, her words, and her guidance. Clinician noted that sister will always be a part of Curtis Townsend and he can and does continue to "talk" to her. Clinician identified the gratitude possible when thinking about being born into the same family and having her as his big sister.    Plan: Return again in 1-2 weeks.   Diagnosis: Axis I: Bipolar I Disorder, mixed, moderate    I discussed the assessment and treatment plan with the patient. The  patient was provided an opportunity to ask questions and all were answered. The patient agreed with the plan and demonstrated an understanding of the instructions.   The patient was advised to call back or seek an in-person evaluation if the symptoms worsen or if the condition fails to improve as anticipated.  I provided 45 minutes of non-face-to-face time during this encounter.   [name], LCSW

## 2019-12-29 ENCOUNTER — Encounter (HOSPITAL_COMMUNITY): Payer: Self-pay

## 2019-12-29 ENCOUNTER — Encounter (HOSPITAL_COMMUNITY): Payer: Self-pay | Admitting: Licensed Clinical Social Worker

## 2019-12-29 ENCOUNTER — Ambulatory Visit (INDEPENDENT_AMBULATORY_CARE_PROVIDER_SITE_OTHER): Payer: Federal, State, Local not specified - PPO | Admitting: Licensed Clinical Social Worker

## 2019-12-29 ENCOUNTER — Ambulatory Visit (INDEPENDENT_AMBULATORY_CARE_PROVIDER_SITE_OTHER): Payer: Federal, State, Local not specified - PPO

## 2019-12-29 ENCOUNTER — Other Ambulatory Visit: Payer: Self-pay

## 2019-12-29 DIAGNOSIS — F319 Bipolar disorder, unspecified: Secondary | ICD-10-CM | POA: Diagnosis not present

## 2019-12-29 DIAGNOSIS — F3162 Bipolar disorder, current episode mixed, moderate: Secondary | ICD-10-CM

## 2019-12-29 NOTE — Progress Notes (Signed)
Virtual Visit via Telephone Note  I connected with Curtis Townsend on 12/29/19 at 12:30 PM EDT by telephone and verified that I am speaking with the correct person using two identifiers.  Location: Patient: home Provider: office   I discussed the limitations, risks, security and privacy concerns of performing an evaluation and management service by telephone and the availability of in person appointments. I also discussed with the patient that there may be a patient responsible charge related to this service. The patient expressed understanding and agreed to proceed.  Type of Therapy: Individual Therapy   Treatment Goals addressed: "to make sure my mood is stable and to help me cope with having to be in my house all the time". Gabrielle will demonstrate and report increased mood stabilization and coping skills 5 out of 7 days".    Interventions: Motivational Interviewing   Summary: Taras Rask is a 44 y.o. male who presents with Bipolar I Disorder, mixed, moderate   Suicidal/Homicidal: No without intent/plan   Therapist Response:   Yamil met with clinician for individual therapy. Aashish discussed his psychiatric symptoms and current life events. Koltan reports that he has been feeling better this week. He reported he had his shot and had no sadness or downturn of his mood over the past week. Clinician utilized MI OARS to reflect and summarize thoughts and feelings. Clinician identified the change in his interactions with his family since he has been feeling more stable. Clinician processed options for entertaining his children over the summer. Clinician researched local play grounds, splash pads, and other free or cheap options to get the kids outside and out of the house. Sultan reported that he, his wife, his mother, and his oldest daughter are all vaccinated. Clinician processed that experience and noted change of heart when it came to the vaccinations. Clinician reflected that  Charle needs his own time to do what is right for him. Romuald agreed that he takes more time to internalize the right decision for himself, but he will eventually figure it out.    Plan: Return again in 1-2 weeks.   Diagnosis: Axis I: Bipolar I Disorder, mixed, moderate        I discussed the assessment and treatment plan with the patient. The patient was provided an opportunity to ask questions and all were answered. The patient agreed with the plan and demonstrated an understanding of the instructions.   The patient was advised to call back or seek an in-person evaluation if the symptoms worsen or if the condition fails to improve as anticipated.  I provided 40 minutes of non-face-to-face time during this encounter.   [name], LCSW

## 2019-12-29 NOTE — Progress Notes (Signed)
Pt presents to clinic today for due Abilify Maintena 400 mg. Pt is cooperative. Injection given in right deltoid without c/o. Pt denies any mood lability or avh since last injection. Pt will return in approximately one month for next due injection.

## 2020-01-05 ENCOUNTER — Other Ambulatory Visit (HOSPITAL_COMMUNITY): Payer: Self-pay | Admitting: Psychiatry

## 2020-01-05 DIAGNOSIS — F4310 Post-traumatic stress disorder, unspecified: Secondary | ICD-10-CM

## 2020-01-14 ENCOUNTER — Encounter (HOSPITAL_COMMUNITY): Payer: Self-pay | Admitting: Licensed Clinical Social Worker

## 2020-01-14 ENCOUNTER — Other Ambulatory Visit: Payer: Self-pay

## 2020-01-14 ENCOUNTER — Ambulatory Visit (INDEPENDENT_AMBULATORY_CARE_PROVIDER_SITE_OTHER): Payer: Federal, State, Local not specified - PPO | Admitting: Licensed Clinical Social Worker

## 2020-01-14 DIAGNOSIS — F319 Bipolar disorder, unspecified: Secondary | ICD-10-CM

## 2020-01-14 NOTE — Progress Notes (Signed)
Virtual Visit via Telephone Note  I connected with Curtis Townsend on 01/14/20 at  4:30 PM EDT by telephone and verified that I am speaking with the correct person using two identifiers.  Location: Patient: home Provider: office   I discussed the limitations, risks, security and privacy concerns of performing an evaluation and management service by telephone and the availability of in person appointments. I also discussed with the patient that there may be a patient responsible charge related to this service. The patient expressed understanding and agreed to proceed.   Type of Therapy: Individual Therapy   Treatment Goals addressed: "to make sure my mood is stable and to help me cope with having to be in my house all the time". [name] will demonstrate and report increased mood stabilization and coping skills 5 out of 7 days".    Interventions: Motivational Interviewing   Summary: Curtis Townsend is a 44 y.o. male who presents with Bipolar I Disorder, mixed, moderate   Suicidal/Homicidal: No without intent/plan   Therapist Response:   Cailan met with clinician for individual therapy. Bracken discussed his psychiatric symptoms and current life events. [name] reports that mood has been stable and no major issues have been present since last session. Clinician explored interactions with children, mother, and wife. [name] processed ongoing challenges in raising a child on the Autism Spectrum. Clinician utilized MI OARS to reflect and summarize the feelings about his son, managing his own stress levels in parenting him, and also concerns about him having a good life. Clinician validated these concerns and also noted the challenges of the numerous appointments and doctors/providers in his son's care. Clinician examined sense of guilt and responsibility for his son's diagnosis. Clinician assisted [name] in identifying more positive thoughts about his son. Clinician also provided feedback about  making sure supports are in place for school, such as allowing him to change his own diapers/pull ups at preschool, since he is not yet fully potty trained.    Plan: Return again in 1-2 weeks.   Diagnosis: Axis I: Bipolar I Disorder, mixed, moderate  I discussed the assessment and treatment plan with the patient. The patient was provided an opportunity to ask questions and all were answered. The patient agreed with the plan and demonstrated an understanding of the instructions.   The patient was advised to call back or seek an in-person evaluation if the symptoms worsen or if the condition fails to improve as anticipated.  I provided 45 minutes of non-face-to-face time during this encounter.    R , LCSW  

## 2020-01-28 ENCOUNTER — Other Ambulatory Visit: Payer: Self-pay

## 2020-01-28 ENCOUNTER — Ambulatory Visit (INDEPENDENT_AMBULATORY_CARE_PROVIDER_SITE_OTHER): Payer: Federal, State, Local not specified - PPO | Admitting: *Deleted

## 2020-01-28 DIAGNOSIS — F319 Bipolar disorder, unspecified: Secondary | ICD-10-CM | POA: Diagnosis not present

## 2020-01-28 NOTE — Progress Notes (Signed)
Pt in clinic today for due Abilify maintena 400 mg injection. Pt appropriate, guarded,  cooperative on approach. Eye contact minimal. Injection given in left deltoid w/o c/o. Pt denies mood lability or avh. Pt to return in approximately 4 weeks for next due injection.

## 2020-01-29 ENCOUNTER — Encounter (HOSPITAL_COMMUNITY): Payer: Self-pay | Admitting: Licensed Clinical Social Worker

## 2020-01-29 ENCOUNTER — Ambulatory Visit (INDEPENDENT_AMBULATORY_CARE_PROVIDER_SITE_OTHER): Payer: Federal, State, Local not specified - PPO | Admitting: Licensed Clinical Social Worker

## 2020-01-29 ENCOUNTER — Other Ambulatory Visit: Payer: Self-pay

## 2020-01-29 DIAGNOSIS — F319 Bipolar disorder, unspecified: Secondary | ICD-10-CM | POA: Diagnosis not present

## 2020-01-29 NOTE — Progress Notes (Signed)
Virtual Visit via Telephone Note  I connected with Curtis Townsend on 01/29/20 at  9:00 AM EDT by telephone and verified that I am speaking with the correct person using two identifiers.  Location: Patient: home Provider: office   I discussed the limitations, risks, security and privacy concerns of performing an evaluation and management service by telephone and the availability of in person appointments. I also discussed with the patient that there may be a patient responsible charge related to this service. The patient expressed understanding and agreed to proceed.  Type of Therapy: Individual Therapy   Treatment Goals addressed: "to make sure my mood is stable and to help me cope with having to be in my house all the time". Earnie will demonstrate and report increased mood stabilization and coping skills 5 out of 7 days".    Interventions: Motivational Interviewing   Summary: Duglas Heier is a 44 y.o. male who presents with Bipolar I Disorder, mixed, moderate   Suicidal/Homicidal: No without intent/plan   Therapist Response:   Kaenan met with clinician for individual therapy. Orlo discussed his psychiatric symptoms and current life events. Olawale reports that he has been a little worried about his wife, who will have surgery soon. Clinician explored the recovery time and what that will mean for Denym. Clinician processed the responsibilities that will shift to him from his wife for the 6 week recovery time. Clinician utilized Motivational Interviewing to reflect and summarize thoughts and feelings about adding these responsibilities, such as cooking and cleaning. Clinician also discussed ways to make this easier for him in the days leading up to the surgery, while wife is still able to help. Clinician identified ways to organize meals, such as making a menu, preparing meals and freezing them, and being creative with easy meals, such as store bought items (chicken and salad).  Clinician discussed Kalup's worry about the surgery itself and his wife's recovery. Clinician reflected the concern about the procedure and identified that this is relatively common and will hopefully go without incident.    Plan: Return again in 1-2 weeks.   Diagnosis: Axis I: Bipolar I Disorder, mixed, moderate     I discussed the assessment and treatment plan with the patient. The patient was provided an opportunity to ask questions and all were answered. The patient agreed with the plan and demonstrated an understanding of the instructions.   The patient was advised to call back or seek an in-person evaluation if the symptoms worsen or if the condition fails to improve as anticipated.  I provided 45 minutes of non-face-to-face time during this encounter.   [name], LCSW

## 2020-02-10 ENCOUNTER — Other Ambulatory Visit: Payer: Self-pay

## 2020-02-10 ENCOUNTER — Encounter (HOSPITAL_COMMUNITY): Payer: Self-pay | Admitting: Psychiatry

## 2020-02-10 ENCOUNTER — Telehealth (INDEPENDENT_AMBULATORY_CARE_PROVIDER_SITE_OTHER): Payer: Federal, State, Local not specified - PPO | Admitting: Psychiatry

## 2020-02-10 DIAGNOSIS — F319 Bipolar disorder, unspecified: Secondary | ICD-10-CM | POA: Diagnosis not present

## 2020-02-10 DIAGNOSIS — F4310 Post-traumatic stress disorder, unspecified: Secondary | ICD-10-CM | POA: Diagnosis not present

## 2020-02-10 MED ORDER — TRAZODONE HCL 100 MG PO TABS: 100.0000 mg | ORAL_TABLET | Freq: Every evening | ORAL | 2 refills | Status: DC | PRN

## 2020-02-10 MED ORDER — ABILIFY MAINTENA 400 MG IM PRSY: 400.0000 mg | PREFILLED_SYRINGE | INTRAMUSCULAR | 2 refills | Status: DC

## 2020-02-10 NOTE — Progress Notes (Signed)
Virtual Visit via Telephone Note  I connected with Curtis Townsend on 02/10/20 at  9:40 AM EDT by telephone and verified that I am speaking with the correct person using two identifiers.  Location: Patient: home Provider: home office   I discussed the limitations, risks, security and privacy concerns of performing an evaluation and management service by telephone and the availability of in person appointments. I also discussed with the patient that there may be a patient responsible charge related to this service. The patient expressed understanding and agreed to proceed.   History of Present Illness: Patient is evaluated by phone session.  He is doing better with the trazodone since he is taking every night 100 mg and sleeping better.  However he did not pick up his refill and for past week he is struggling with insomnia.  He reported when he was taking trazodone he does not have any nightmares or flashbacks.  He is compliant with Abilify injection and denies any mood swings, anger, mania or psychosis.  He is in therapy with [name] and he reported much improved relationship with his wife and family members.  He has no tremors shakes or any EPS.  His appetite is okay.  His weight is stable.  He like to continue his current medication.   Past Psychiatric History:Reviewed. H/Oabuse, anger, paranoia, ADHD and poor impulse control. H/Osuicidal attempt as tried to hang himself and sister saved him. H/Omultiple inpatient treatment and last inpatient in September 2019. H/O THC use. TookProzac, Risperdal,Risperdal, Depakote, hydroxyzine and Depakotebut discontinued due to noncompliance.     Psychiatric Specialty Exam: Physical Exam  Review of Systems  Weight 200 lb (90.7 kg).There is no height or weight on file to calculate BMI.  General Appearance: NA  Eye Contact:  NA  Speech:  Slow  Volume:  Normal  Mood:  Euthymic  Affect:  NA  Thought Process:  Goal Directed  Orientation:   Full (Time, Place, and Person)  Thought Content:  Logical  Suicidal Thoughts:  No  Homicidal Thoughts:  No  Memory:  Immediate;   Good Recent;   Good Remote;   Good  Judgement:  Intact  Insight:  Present  Psychomotor Activity:  NA  Concentration:  Concentration: Fair and Attention Span: Fair  Recall:  Good  Fund of Knowledge:  Good  Language:  Good  Akathisia:  No  Handed:  Right  AIMS (if indicated):     Assets:  Communication Skills Desire for Improvement Housing Social Support Transportation  ADL's:  Intact  Cognition:  WNL  Sleep:   fair      Assessment and Plan: PTSD.  Bipolar disorder type I.  Patient doing better with trazodone but he did not pick up his last refill and struggling with sleep.  Discussed compliance with medication.  Continue trazodone 100 mg at bedtime and Abilify injection 400 mg intramuscular every 28 days.  Continue therapy with [name].  Recommended to call us back if is any question or any concern.  Follow-up with  Follow Up Instructions:    I discussed the assessment and treatment plan with the patient. The patient was provided an opportunity to ask questions and all were answered. The patient agreed with the plan and demonstrated an understanding of the instructions.   The patient was advised to call back or seek an in-person evaluation if the symptoms worsen or if the condition fails to improve as anticipated.  I provided 15 minutes of non-face-to-face time during this encounter.  [name], MD

## 2020-02-11 ENCOUNTER — Other Ambulatory Visit: Payer: Self-pay

## 2020-02-11 ENCOUNTER — Encounter (HOSPITAL_COMMUNITY): Payer: Self-pay | Admitting: Licensed Clinical Social Worker

## 2020-02-11 ENCOUNTER — Ambulatory Visit (INDEPENDENT_AMBULATORY_CARE_PROVIDER_SITE_OTHER): Payer: Federal, State, Local not specified - PPO | Admitting: Licensed Clinical Social Worker

## 2020-02-11 DIAGNOSIS — F319 Bipolar disorder, unspecified: Secondary | ICD-10-CM

## 2020-02-11 DIAGNOSIS — F4310 Post-traumatic stress disorder, unspecified: Secondary | ICD-10-CM

## 2020-02-11 NOTE — Progress Notes (Signed)
Virtual Visit via Telephone Note  I connected with Curtis Townsend on 02/11/20 at 12:30 PM EDT by telephone and verified that I am speaking with the correct person using two identifiers.  Location: Patient: home Provider: office   I discussed the limitations, risks, security and privacy concerns of performing an evaluation and management service by telephone and the availability of in person appointments. I also discussed with the patient that there may be a patient responsible charge related to this service. The patient expressed understanding and agreed to proceed.   Type of Therapy: Individual Therapy   Treatment Goals addressed: "to make sure my mood is stable and to help me cope with having to be in my house all the time". Korby will demonstrate and report increased mood stabilization and coping skills 5 out of 7 days".    Interventions: Motivational Interviewing, CBT   Summary: Curtis Townsend is a 44 y.o. male who presents with Bipolar I Disorder, mixed, moderate   Suicidal/Homicidal: No without intent/plan   Therapist Response:   Curtis Townsend met with clinician for individual therapy. Curtis Townsend discussed his psychiatric symptoms and current life events. Curtis Townsend reports that he has been worried about an upcoming trip to [place]. Clinician utilized MI OARS to reflect and summarize concerns about travelling to [place] due to [place]. Clinician processed concerns, explored options for staying safe in [place] or having to postpone the trip. Clinician utilized CBT Pros and Cons list in order to assist [name] in decision making. Clinician encouraged Curtis Townsend to consider health, safety, and his own mental comfort level with being around many people in a different environment. Clinician encouraged Curtis Townsend to consult with wife before making the final decision.    Plan: Return again in 1-2 weeks.   Diagnosis: Axis I: Bipolar I Disorder, mixed, moderate   I discussed the assessment and treatment plan with the  patient. The patient was provided an opportunity to ask questions and all were answered. The patient agreed with the plan and demonstrated an understanding of the instructions.   The patient was advised to call back or seek an in-person evaluation if the symptoms worsen or if the condition fails to improve as anticipated.  I provided 45 minutes of non-face-to-face time during this encounter.   Curtis Curling, LCSW

## 2020-02-24 ENCOUNTER — Other Ambulatory Visit (HOSPITAL_COMMUNITY): Payer: Self-pay | Admitting: Psychiatry

## 2020-02-24 DIAGNOSIS — F4310 Post-traumatic stress disorder, unspecified: Secondary | ICD-10-CM

## 2020-02-25 ENCOUNTER — Ambulatory Visit (INDEPENDENT_AMBULATORY_CARE_PROVIDER_SITE_OTHER): Payer: Federal, State, Local not specified - PPO | Admitting: Licensed Clinical Social Worker

## 2020-02-25 ENCOUNTER — Encounter (HOSPITAL_COMMUNITY): Payer: Self-pay | Admitting: Licensed Clinical Social Worker

## 2020-02-25 ENCOUNTER — Other Ambulatory Visit: Payer: Self-pay

## 2020-02-25 DIAGNOSIS — F319 Bipolar disorder, unspecified: Secondary | ICD-10-CM

## 2020-02-25 NOTE — Progress Notes (Signed)
Virtual Visit via Telephone Note  I connected with Curtis Townsend on 02/25/20 at 12:30 PM EDT by telephone and verified that I am speaking with the correct person using two identifiers.  Location: Patient: home Provider: office   I discussed the limitations, risks, security and privacy concerns of performing an evaluation and management service by telephone and the availability of in person appointments. I also discussed with the patient that there may be a patient responsible charge related to this service. The patient expressed understanding and agreed to proceed.  Type of Therapy: Individual Therapy   Treatment Goals addressed: "to make sure my mood is stable and to help me cope with having to be in my house all the time". Curtis Townsend will demonstrate and report increased mood stabilization and coping skills 5 out of 7 days".    Interventions: Motivational Interviewing, mindfulness   Summary: Curtis Townsend is a 44 y.o. male who presents with Bipolar I Disorder, mixed, moderate   Suicidal/Homicidal: No without intent/plan   Therapist Response:   Curtis Townsend met with clinician for individual therapy. Curtis Townsend discussed Curtis psychiatric symptoms and current life events. Curtis Townsend reports that he found out last night that Curtis landlord will be selling their house. Clinician utilized MI OARS to reflect and summarize thoughts and feelings. Clinician explored anxiety levels and led Curtis Townsend through deep breathing and mindfulness. Clinician encouraged Curtis Townsend to allow grief and tears to come out, as he reported feeling hopeless and lost. Clinician also identified that action will need to be taken to find a new house. Clinician validated fear of Curtis children being homeless and also challenged those thoughts, reinforcing Atzel and Curtis Townsend dedication to their family and fighting spirit which will allow them to find a better home for the family.    Plan: Return again in 1-2 weeks.   Diagnosis: Axis I:  Bipolar I Disorder, mixed, moderate   I discussed the assessment and treatment plan with the patient. The patient was provided an opportunity to ask questions and all were answered. The patient agreed with the plan and demonstrated an understanding of the instructions.   The patient was advised to call back or seek an in-person evaluation if the symptoms worsen or if the condition fails to improve as anticipated.  I provided 45 minutes of non-face-to-face time during this encounter.   [name], LCSW

## 2020-02-26 ENCOUNTER — Ambulatory Visit (HOSPITAL_COMMUNITY): Payer: Federal, State, Local not specified - PPO | Admitting: *Deleted

## 2020-02-26 ENCOUNTER — Other Ambulatory Visit: Payer: Self-pay

## 2020-02-26 VITALS — BP 130/83 | HR 100 | Ht 73.0 in | Wt 202.0 lb

## 2020-02-26 DIAGNOSIS — F319 Bipolar disorder, unspecified: Secondary | ICD-10-CM

## 2020-02-26 NOTE — Progress Notes (Signed)
Pt presemts to clinic today for due Abilify Maintena injection 400mg .pt constricted affect, guarded with minimal eye contact. Pt says he's losing his house. Probation officer gave some resources for housing in [place].injection given in r deltoid w/o c/o. Pt to return in 4 weeks for next due injection.

## 2020-03-10 ENCOUNTER — Other Ambulatory Visit: Payer: Self-pay

## 2020-03-10 ENCOUNTER — Ambulatory Visit (INDEPENDENT_AMBULATORY_CARE_PROVIDER_SITE_OTHER): Payer: Federal, State, Local not specified - PPO | Admitting: Licensed Clinical Social Worker

## 2020-03-10 DIAGNOSIS — F319 Bipolar disorder, unspecified: Secondary | ICD-10-CM | POA: Diagnosis not present

## 2020-03-11 ENCOUNTER — Encounter (HOSPITAL_COMMUNITY): Payer: Self-pay | Admitting: Licensed Clinical Social Worker

## 2020-03-11 NOTE — Progress Notes (Signed)
Virtual Visit via Telephone Note  I connected with Curtis Townsend on 03/11/20 at 12:30 PM EDT by telephone and verified that I am speaking with the correct person using two identifiers.  Location: Patient: home Provider: office   I discussed the limitations, risks, security and privacy concerns of performing an evaluation and management service by telephone and the availability of in person appointments. I also discussed with the patient that there may be a patient responsible charge related to this service. The patient expressed understanding and agreed to proceed.  Type of Therapy: Individual Therapy   Treatment Goals addressed: "to make sure my mood is stable and to help me cope with having to be in my house all the time". Curtis Townsend will demonstrate and report increased mood stabilization and coping skills 5 out of 7 days".    Interventions: CBT   Summary: Curtis Townsend is a 44 y.o. male who presents with Bipolar I Disorder, mixed, moderate   Suicidal/Homicidal: No without intent/plan   Therapist Response:   Curtis Townsend met with clinician for individual therapy. Curtis Townsend discussed his psychiatric symptoms and current life events. Curtis Townsend reports that after our last session, his landlord gave them 30 days notice to vacate the home. Clinician explored thoughts, feelings, and behaviors with this news. Clinician identified panic and fear at first, but noted transition into action. Clinician utilized CBT to process the thought patterns that drove Curtis Townsend and his wife to find a new home. Clinician noted that the change is unwelcomed, but that it may turn out okay, as the new house is bigger and more centrally located in town. Clinician validated anxiety about the move, but also noted that there is time and it will work out.    Plan: Return again in 1-2 weeks.   Diagnosis: Axis I: Bipolar I Disorder, mixed, moderate     I discussed the assessment and treatment plan with the patient. The  patient was provided an opportunity to ask questions and all were answered. The patient agreed with the plan and demonstrated an understanding of the instructions.   The patient was advised to call back or seek an in-person evaluation if the symptoms worsen or if the condition fails to improve as anticipated.  I provided 45 minutes of non-face-to-face time during this encounter.   [name], LCSW

## 2020-03-24 ENCOUNTER — Encounter (HOSPITAL_COMMUNITY): Payer: Self-pay | Admitting: Licensed Clinical Social Worker

## 2020-03-24 ENCOUNTER — Ambulatory Visit (INDEPENDENT_AMBULATORY_CARE_PROVIDER_SITE_OTHER): Payer: Federal, State, Local not specified - PPO | Admitting: Licensed Clinical Social Worker

## 2020-03-24 ENCOUNTER — Other Ambulatory Visit: Payer: Self-pay

## 2020-03-24 DIAGNOSIS — F319 Bipolar disorder, unspecified: Secondary | ICD-10-CM

## 2020-03-24 NOTE — Progress Notes (Signed)
Virtual Visit via Telephone Note  I connected with Curtis Townsend on 03/24/20 at  1:30 PM EDT by telephone and verified that I am speaking with the correct person using two identifiers.  Location: Patient: home Provider: office   I discussed the limitations, risks, security and privacy concerns of performing an evaluation and management service by telephone and the availability of in person appointments. I also discussed with the patient that there may be a patient responsible charge related to this service. The patient expressed understanding and agreed to proceed.   Type of Therapy: Individual Therapy   Treatment Goals addressed: "to make sure my mood is stable and to help me cope with having to be in my house all the time". Curtis Townsend will demonstrate and report increased mood stabilization and coping skills 5 out of 7 days".    Interventions: CBT   Summary: Curtis Townsend is a 44 y.o. male who presents with Bipolar I Disorder, mixed, moderate   Suicidal/Homicidal: No without intent/plan   Therapist Response:   Reagen met with clinician for individual therapy. Jakim discussed his psychiatric symptoms and current life events. Ellen reports that he and his family were able to move into their new home. Clinician processed events of the move using CBT. Clinician reflected increased anxiety and difficulty sleeping due to living on [place] busy street now. Clinician discussed the process of adjustment and encouraged Curtis Townsend to find a sleep app on his phone or to use white noise machine or a fan. Clinician explored mood and noted worry and stress due to youngest daughter dx with COVID 20 and strep throat. Clinician explored contact tracing and noted that it probably came from school and no one in the house is sick. Clinician urged them to get checked to make sure everyone is healthy. Clinician reviewed coping skills, deep breathing, and positive self talk.    Plan: Return again in 1-2 weeks.    Diagnosis: Axis I: Bipolar I Disorder, mixed, moderate    I discussed the assessment and treatment plan with the patient. The patient was provided an opportunity to ask questions and all were answered. The patient agreed with the plan and demonstrated an understanding of the instructions.   The patient was advised to call back or seek an in-person evaluation if the symptoms worsen or if the condition fails to improve as anticipated.  I provided 45 minutes of non-face-to-face time during this encounter.   [name], LCSW

## 2020-03-25 ENCOUNTER — Ambulatory Visit (HOSPITAL_COMMUNITY): Payer: Federal, State, Local not specified - PPO

## 2020-03-26 DIAGNOSIS — Z20822 Contact with and (suspected) exposure to covid-19: Secondary | ICD-10-CM | POA: Diagnosis not present

## 2020-04-06 ENCOUNTER — Encounter (HOSPITAL_COMMUNITY): Payer: Self-pay

## 2020-04-06 ENCOUNTER — Ambulatory Visit (HOSPITAL_COMMUNITY): Payer: Federal, State, Local not specified - PPO

## 2020-04-06 ENCOUNTER — Other Ambulatory Visit: Payer: Self-pay

## 2020-04-06 DIAGNOSIS — F319 Bipolar disorder, unspecified: Secondary | ICD-10-CM

## 2020-04-06 NOTE — Progress Notes (Signed)
Pt presemts to clinic today for due Abilify Maintena injection 400mg .pt constricted affect, guarded with minimal eye contact. Injjection given in r deltoid w/o c/o. Pt to return in 4 weeks for next due injection.

## 2020-04-07 ENCOUNTER — Other Ambulatory Visit: Payer: Self-pay

## 2020-04-07 ENCOUNTER — Ambulatory Visit (INDEPENDENT_AMBULATORY_CARE_PROVIDER_SITE_OTHER): Payer: Federal, State, Local not specified - PPO | Admitting: Licensed Clinical Social Worker

## 2020-04-07 ENCOUNTER — Encounter (HOSPITAL_COMMUNITY): Payer: Self-pay | Admitting: Licensed Clinical Social Worker

## 2020-04-07 DIAGNOSIS — F319 Bipolar disorder, unspecified: Secondary | ICD-10-CM | POA: Diagnosis not present

## 2020-04-07 NOTE — Progress Notes (Signed)
Virtual Visit via Telephone Note  I connected with Curtis Townsend on 04/07/20 at 11:00 AM EDT by telephone and verified that I am speaking with the correct person using two identifiers.  Location: Patient: home Provider: office   I discussed the limitations, risks, security and privacy concerns of performing an evaluation and management service by telephone and the availability of in person appointments. I also discussed with the patient that there may be a patient responsible charge related to this service. The patient expressed understanding and agreed to proceed.  Type of Therapy: Individual Therapy   Treatment Goals addressed: "to make sure my mood is stable and to help me cope with having to be in my house all the time". Curtis Townsend will demonstrate and report increased mood stabilization and coping skills 5 out of 7 days".    Interventions: CBT   Summary: Curtis Townsend is a 44 y.o. male who presents with Bipolar I Disorder, mixed, moderate   Suicidal/Homicidal: No without intent/plan   Therapist Response:   Curtis Townsend met with clinician for individual therapy. Curtis Townsend discussed his psychiatric symptoms and current life events. Curtis Townsend reports that two of his children, as well as himself and his mother tested positive for COVID. Clinician explored sxs and experience with COVID, as well as current status. Curtis Townsend reports he tested positive, but had no sxs, mother had a sore throat, son had a fever and cough, and daughter had COVID and Strep Throat. Clinician explored current emotional state, noting that due to his quarantine, he was about a week late on his Abilify Maintena injection. Clinician processed the difference in his mood stability with this medication. Curtis Townsend also identified the usefulness of only having to get a shot, rather than taking a daily medication.    Plan: Return again in 1-2 weeks.   Diagnosis: Axis I: Bipolar I Disorder, mixed, moderate      I discussed the  assessment and treatment plan with the patient. The patient was provided an opportunity to ask questions and all were answered. The patient agreed with the plan and demonstrated an understanding of the instructions.   The patient was advised to call back or seek an in-person evaluation if the symptoms worsen or if the condition fails to improve as anticipated.  I provided 45 minutes of non-face-to-face time during this encounter.   [name], LCSW

## 2020-04-21 ENCOUNTER — Ambulatory Visit (INDEPENDENT_AMBULATORY_CARE_PROVIDER_SITE_OTHER): Payer: Federal, State, Local not specified - PPO | Admitting: Licensed Clinical Social Worker

## 2020-04-21 ENCOUNTER — Encounter (HOSPITAL_COMMUNITY): Payer: Self-pay | Admitting: Licensed Clinical Social Worker

## 2020-04-21 ENCOUNTER — Other Ambulatory Visit: Payer: Self-pay

## 2020-04-21 DIAGNOSIS — F319 Bipolar disorder, unspecified: Secondary | ICD-10-CM | POA: Diagnosis not present

## 2020-04-21 NOTE — Progress Notes (Signed)
Virtual Visit via Telephone Note  I connected with Curtis Townsend on 04/21/20 at 12:30 PM EDT by telephone and verified that I am speaking with the correct person using two identifiers.  Location: Patient: home Provider: office   I discussed the limitations, risks, security and privacy concerns of performing an evaluation and management service by telephone and the availability of in person appointments. I also discussed with the patient that there may be a patient responsible charge related to this service. The patient expressed understanding and agreed to proceed.  Type of Therapy: Individual Therapy   Treatment Goals addressed: "to make sure my mood is stable and to help me cope with having to be in my house all the time". Curtis Townsend will demonstrate and report increased mood stabilization and coping skills 5 out of 7 days".    Interventions: CBT   Summary: Curtis Townsend is a 44 y.o. male who presents with Bipolar I Disorder, mixed, moderate   Suicidal/Homicidal: No without intent/plan   Therapist Response:   Mose met with clinician for individual therapy. Aleksei discussed his psychiatric symptoms and current life events. Kaisen reports that everyone is well at home now, with the exception of him with a new cold coming through. Clinician explored how Kavontae reacts when he is sick with a "man cold". Taydon reports that small illnesses often impact his mood greatly, making him feel more tired and depressed. Clinician processed current mood and noted that for the most part, he is doing okay. Clinician explored interactions with wife, mother, and children. Clinician provided CBT psychoeducation about development for his youngest daughter, who is 1, but starting some pre-adolescent attitudes and behaviors. Clinician discussed parenting his children, noting the importance of individualizing consequences to an extent. Clinician processed sleep issues, noting ongoing difficulty getting used  to sleeping in this new house on a noisy street. Clinician encouraged use of a sleep app with ocean sounds.    Plan: Return again in 1-2 weeks.   Diagnosis: Axis I: Bipolar I Disorder, mixed, moderate    I discussed the assessment and treatment plan with the patient. The patient was provided an opportunity to ask questions and all were answered. The patient agreed with the plan and demonstrated an understanding of the instructions.   The patient was advised to call back or seek an in-person evaluation if the symptoms worsen or if the condition fails to improve as anticipated.  I provided 45 minutes of non-face-to-face time during this encounter.   [name], LCSW

## 2020-05-05 ENCOUNTER — Other Ambulatory Visit: Payer: Self-pay

## 2020-05-05 ENCOUNTER — Ambulatory Visit (INDEPENDENT_AMBULATORY_CARE_PROVIDER_SITE_OTHER): Payer: Federal, State, Local not specified - PPO | Admitting: Licensed Clinical Social Worker

## 2020-05-05 DIAGNOSIS — F319 Bipolar disorder, unspecified: Secondary | ICD-10-CM | POA: Diagnosis not present

## 2020-05-06 ENCOUNTER — Ambulatory Visit (INDEPENDENT_AMBULATORY_CARE_PROVIDER_SITE_OTHER): Payer: Federal, State, Local not specified - PPO | Admitting: *Deleted

## 2020-05-06 ENCOUNTER — Encounter (HOSPITAL_COMMUNITY): Payer: Self-pay | Admitting: Licensed Clinical Social Worker

## 2020-05-06 ENCOUNTER — Encounter (HOSPITAL_COMMUNITY): Payer: Self-pay | Admitting: *Deleted

## 2020-05-06 ENCOUNTER — Other Ambulatory Visit: Payer: Self-pay

## 2020-05-06 ENCOUNTER — Other Ambulatory Visit (HOSPITAL_COMMUNITY): Payer: Self-pay | Admitting: *Deleted

## 2020-05-06 DIAGNOSIS — F250 Schizoaffective disorder, bipolar type: Secondary | ICD-10-CM

## 2020-05-06 NOTE — Progress Notes (Signed)
Pt in clinic today for due Abilify Maintena 400mg  injection. Pt flat with minimal eye contact. Guarded. Cooperative on approach. Injection given in right deltoid per pt request. Pt tolerated well. Pt to return in approximately 4 weeks for next due injection.

## 2020-05-06 NOTE — Progress Notes (Signed)
Virtual Visit via Telephone Note  I connected with Helyn Numbers on 05/06/20 at 12:30 PM EDT by telephone and verified that I am speaking with the correct person using two identifiers.  Location: Patient: home Provider: office   I discussed the limitations, risks, security and privacy concerns of performing an evaluation and management service by telephone and the availability of in person appointments. I also discussed with the patient that there may be a patient responsible charge related to this service. The patient expressed understanding and agreed to proceed.  Type of Therapy: Individual Therapy   Treatment Goals addressed: "to make sure my mood is stable and to help me cope with having to be in my house all the time". Damondre will demonstrate and report increased mood stabilization and coping skills 5 out of 7 days".    Interventions: CBT   Summary: Beauford Lando is a 44 y.o. male who presents with Bipolar I Disorder, mixed, moderate   Suicidal/Homicidal: No without intent/plan   Therapist Response:   Americus met with clinician for individual therapy. Kellar discussed his psychiatric symptoms and current life events. Cheston reports that everyone is well at home now, with the exception of him with a new cold coming through. Clinician explored how Deanglo reacts when he is sick with a "man cold". Aurelius reports that small illnesses often impact his mood greatly, making him feel more tired and depressed. Clinician processed current mood and noted that for the most part, he is doing okay. Clinician explored interactions with wife, mother, and children. Clinician provided CBT psychoeducation about development for his youngest daughter, who is 25, but starting some pre-adolescent attitudes and behaviors. Clinician discussed parenting his children, noting the importance of individualizing consequences to an extent. Clinician processed sleep issues, noting ongoing difficulty getting used  to sleeping in this new house on a noisy street. Clinician encouraged use of a sleep app with ocean sounds.    Plan: Return again in 1-2 weeks.   Diagnosis: Axis I: Bipolar I Disorder, mixed, moderate     I discussed the assessment and treatment plan with the patient. The patient was provided an opportunity to ask questions and all were answered. The patient agreed with the plan and demonstrated an understanding of the instructions.   The patient was advised to call back or seek an in-person evaluation if the symptoms worsen or if the condition fails to improve as anticipated.  I provided 45 minutes of non-face-to-face time during this encounter.   [name], LCSW

## 2020-05-12 ENCOUNTER — Other Ambulatory Visit: Payer: Self-pay

## 2020-05-12 ENCOUNTER — Encounter (HOSPITAL_COMMUNITY): Payer: Self-pay | Admitting: Psychiatry

## 2020-05-12 ENCOUNTER — Telehealth (INDEPENDENT_AMBULATORY_CARE_PROVIDER_SITE_OTHER): Payer: Federal, State, Local not specified - PPO | Admitting: Psychiatry

## 2020-05-12 DIAGNOSIS — F319 Bipolar disorder, unspecified: Secondary | ICD-10-CM | POA: Diagnosis not present

## 2020-05-12 DIAGNOSIS — F4310 Post-traumatic stress disorder, unspecified: Secondary | ICD-10-CM

## 2020-05-12 MED ORDER — TRAZODONE HCL 100 MG PO TABS: 100.0000 mg | ORAL_TABLET | Freq: Every evening | ORAL | 2 refills | Status: DC | PRN

## 2020-05-12 MED ORDER — ABILIFY MAINTENA 400 MG IM PRSY: 400.0000 mg | PREFILLED_SYRINGE | INTRAMUSCULAR | 2 refills | Status: DC
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
## 2020-05-18 ENCOUNTER — Other Ambulatory Visit: Payer: Self-pay

## 2020-05-18 ENCOUNTER — Encounter (HOSPITAL_COMMUNITY): Payer: Self-pay | Admitting: Licensed Clinical Social Worker

## 2020-05-18 ENCOUNTER — Ambulatory Visit (INDEPENDENT_AMBULATORY_CARE_PROVIDER_SITE_OTHER): Payer: Federal, State, Local not specified - PPO | Admitting: Licensed Clinical Social Worker

## 2020-05-18 DIAGNOSIS — F3160 Bipolar disorder, current episode mixed, unspecified: Secondary | ICD-10-CM

## 2020-05-18 NOTE — Progress Notes (Signed)
Virtual Visit via Telephone Note ° °I connected with Curtis Townsend on 05/18/20 at 10:00 AM EST by telephone and verified that I am speaking with the correct person using two identifiers. ° °Location: °Patient: home °Provider: office °  °I discussed the limitations, risks, security and privacy concerns of performing an evaluation and management service by telephone and the availability of in person appointments. I also discussed with the patient that there may be a patient responsible charge related to this service. The patient expressed understanding and agreed to proceed. ° °Type of Therapy: Individual Therapy °  °Treatment Goals addressed: "to make sure my mood is stable and to help me cope with having to be in my house all the time". Curtis Townsend will demonstrate and report increased mood stabilization and coping skills 5 out of 7 days".  °  °Interventions: CBT °  °Summary: Curtis Townsend is a 44 y.o. male who presents with Bipolar I Disorder, mixed, moderate °  °Suicidal/Homicidal: No without intent/plan °  °Therapist Response: °  °Curtis Townsend met with Curtis Townsend for individual therapy. Curtis Townsend discussed his psychiatric symptoms and current life events. Curtis Townsend reports that he heard from his sister, who expressed concern that father may have stomach cancer. Curtis Townsend explored Curtis Townsend options for reaching out and finding more information. Curtis Townsend validated frustration that he has not received a clear answer whether or not father is actually sick. Curtis Townsend explored plans to visit if father is sick, but not to go if he is not sick. Curtis Townsend utilized CBT and provided time and space for Curtis Townsend to process his feelings about father, based on the past. Curtis Townsend reflected the options that father may have changed, especially if he is sick. Curtis Townsend also validated Curtis Townsend’s view that nothing can really change, because father has never even said, “I love you or I’m sorry” to him ever. Curtis Townsend encouraged Curtis Townsend to  reach out to family members near father to find out more information in order to plan for the future.   ° °Plan: Return again in 1-2 weeks. °  °Diagnosis: Axis I: Bipolar I Disorder, mixed, moderate °I discussed the assessment and treatment plan with the patient. The patient was provided an opportunity to ask questions and all were answered. The patient agreed with the plan and demonstrated an understanding of the instructions. °  °The patient was advised to call back or seek an in-person evaluation if the symptoms worsen or if the condition fails to improve as anticipated. ° °I provided 55 minutes of non-face-to-face time during this encounter. ° ° ° R , LCSW ° °

## 2020-06-07 ENCOUNTER — Ambulatory Visit (HOSPITAL_COMMUNITY): Payer: Federal, State, Local not specified - PPO

## 2020-06-08 ENCOUNTER — Ambulatory Visit (INDEPENDENT_AMBULATORY_CARE_PROVIDER_SITE_OTHER): Payer: Federal, State, Local not specified - PPO | Admitting: *Deleted

## 2020-06-08 ENCOUNTER — Other Ambulatory Visit: Payer: Self-pay

## 2020-06-08 VITALS — BP 122/83 | HR 93 | Ht 73.0 in | Wt 209.0 lb

## 2020-06-08 DIAGNOSIS — F3110 Bipolar disorder, current episode manic without psychotic features, unspecified: Secondary | ICD-10-CM

## 2020-06-08 DIAGNOSIS — F3160 Bipolar disorder, current episode mixed, unspecified: Secondary | ICD-10-CM

## 2020-06-08 NOTE — Progress Notes (Signed)
Patient received injection of Abilfy maintena in right deltoid.  Patient tolerated injection well.  Patient to return in 4 weeks.

## 2020-06-09 ENCOUNTER — Encounter (HOSPITAL_COMMUNITY): Payer: Self-pay | Admitting: Licensed Clinical Social Worker

## 2020-06-09 ENCOUNTER — Ambulatory Visit (INDEPENDENT_AMBULATORY_CARE_PROVIDER_SITE_OTHER): Payer: Federal, State, Local not specified - PPO | Admitting: Licensed Clinical Social Worker

## 2020-06-09 DIAGNOSIS — F3160 Bipolar disorder, current episode mixed, unspecified: Secondary | ICD-10-CM

## 2020-06-09 NOTE — Progress Notes (Signed)
Virtual Visit via Telephone Note  I connected with Helyn Numbers on 06/09/20 at 12:30 PM EST by telephone and verified that I am speaking with the correct person using two identifiers.  Location: Patient: home Provider: home office   I discussed the limitations, risks, security and privacy concerns of performing an evaluation and management service by telephone and the availability of in person appointments. I also discussed with the patient that there may be a patient responsible charge related to this service. The patient expressed understanding and agreed to proceed.  Type of Therapy: Individual Therapy   Treatment Goals addressed: "to make sure my mood is stable and to help me cope with having to be in my house all the time". Sabrina will demonstrate and report increased mood stabilization and coping skills 5 out of 7 days".    Interventions: CBT   Summary: Lorenso Quirino is a 44 y.o. male who presents with Bipolar I Disorder, mixed, moderate   Suicidal/Homicidal: No without intent/plan   Therapist Response:   Yoshi met with clinician for individual therapy. Avraj discussed his psychiatric symptoms and current life events. Mateo reports that he continues to stew on his feelings about his father's health with no action to find out whether the dx is true or false. Clinician utilized CBT to challenge Osmel about his willingness to learn the truth. Clinician utilized cost-benefits analysis in order to help Miklos process the way his current choice to sit on his anxiety and worry is better or worse for him than just calling and finding out. Clinician validated the fear of an emotional reaction. Clinician encouraged [place] to trust himself in being able to respond to normal feelings without fear of losing control.    Plan: Return again in 1-2 weeks.       I discussed the assessment and treatment plan with the patient. The patient was provided an opportunity to ask questions  and all were answered. The patient agreed with the plan and demonstrated an understanding of the instructions.   The patient was advised to call back or seek an in-person evaluation if the symptoms worsen or if the condition fails to improve as anticipated.  I provided 45 minutes of non-face-to-face time during this encounter.   [name], LCSW

## 2020-06-13 ENCOUNTER — Other Ambulatory Visit (HOSPITAL_COMMUNITY): Payer: Self-pay | Admitting: Psychiatry

## 2020-06-13 DIAGNOSIS — F4310 Post-traumatic stress disorder, unspecified: Secondary | ICD-10-CM

## 2020-06-23 ENCOUNTER — Other Ambulatory Visit: Payer: Self-pay

## 2020-06-23 ENCOUNTER — Ambulatory Visit (INDEPENDENT_AMBULATORY_CARE_PROVIDER_SITE_OTHER): Payer: Federal, State, Local not specified - PPO | Admitting: Licensed Clinical Social Worker

## 2020-06-23 DIAGNOSIS — F3160 Bipolar disorder, current episode mixed, unspecified: Secondary | ICD-10-CM

## 2020-06-24 ENCOUNTER — Encounter (HOSPITAL_COMMUNITY): Payer: Self-pay | Admitting: Licensed Clinical Social Worker

## 2020-06-24 NOTE — Progress Notes (Signed)
Virtual Visit via Telephone Note  I connected with Curtis Townsend on 06/24/20 at 12:30 PM EST by telephone and verified that I am speaking with the correct person using two identifiers.  Location: Patient: home Provider: home office   I discussed the limitations, risks, security and privacy concerns of performing an evaluation and management service by telephone and the availability of in person appointments. I also discussed with the patient that there may be a patient responsible charge related to this service. The patient expressed understanding and agreed to proceed.  Type of Therapy: Individual Therapy   Treatment Goals addressed: "to make sure my mood is stable and to help me cope with having to be in my house all the time". Tou will demonstrate and report increased mood stabilization and coping skills 5 out of 7 days".    Interventions: CBT   Summary: Curtis Townsend is a 44 y.o. male who presents with Bipolar I Disorder, mixed, moderate   Suicidal/Homicidal: No without intent/plan   Therapist Response:   Curtis Townsend met with clinician for individual therapy. Curtis Townsend discussed his psychiatric symptoms and current life events. Curtis Townsend reports that he found out that his father does not have cancer, but it was a cancer scare. Clinician utilized CBT to process thoughts, feelings, and behaviors about this news and explored next steps. Clinician discussed Curtis Townsend's feelings of relief, but also some frustration that father never communicated with Curtis Townsend. Clinician explored options to communicate these feelings and thoughts to father, but Curtis Townsend reports he will probably just leave it alone, because it is too intense and emotional. Clinician reflected again that Curtis Townsend is treading lightly with his own feelings, as he wants to remain stable and under control.  Curtis Townsend reported concerns about his daughter, who made a suicidal statement at school. Clinician explored current treatment plan and  noted the importance of communicating with his daughter's therapist about what happened, so she will be able to address this.   Plan: Return again in 1-2 weeks.  Diagnosis: Bipolar I Disorder, mixed, moderate   I discussed the assessment and treatment plan with the patient. The patient was provided an opportunity to ask questions and all were answered. The patient agreed with the plan and demonstrated an understanding of the instructions.   The patient was advised to call back or seek an in-person evaluation if the symptoms worsen or if the condition fails to improve as anticipated.  I provided 45 minutes of non-face-to-face time during this encounter.   Curtis Curling, LCSW

## 2020-07-08 ENCOUNTER — Encounter (HOSPITAL_COMMUNITY): Payer: Self-pay | Admitting: *Deleted

## 2020-07-08 ENCOUNTER — Other Ambulatory Visit: Payer: Self-pay

## 2020-07-08 ENCOUNTER — Ambulatory Visit (INDEPENDENT_AMBULATORY_CARE_PROVIDER_SITE_OTHER): Payer: Federal, State, Local not specified - PPO | Admitting: *Deleted

## 2020-07-08 VITALS — BP 117/74 | HR 81 | Ht 73.0 in | Wt 207.6 lb

## 2020-07-08 DIAGNOSIS — F250 Schizoaffective disorder, bipolar type: Secondary | ICD-10-CM

## 2020-07-08 NOTE — Progress Notes (Signed)
Pt in clinic today for due Abilify Maintena 400 mg injection. Pt quiet with very minimal eye contact. Cooperative on approach. Injection given in left deltoid without c/o. Pt to return in approximately 4 weeks for next due injection.

## 2020-07-15 ENCOUNTER — Ambulatory Visit (INDEPENDENT_AMBULATORY_CARE_PROVIDER_SITE_OTHER): Payer: Federal, State, Local not specified - PPO | Admitting: Licensed Clinical Social Worker

## 2020-07-15 ENCOUNTER — Other Ambulatory Visit: Payer: Self-pay

## 2020-07-15 DIAGNOSIS — F250 Schizoaffective disorder, bipolar type: Secondary | ICD-10-CM

## 2020-07-19 ENCOUNTER — Encounter (HOSPITAL_COMMUNITY): Payer: Self-pay | Admitting: Licensed Clinical Social Worker

## 2020-07-19 NOTE — Progress Notes (Signed)
Virtual Visit via Telephone Note  I connected with Curtis Townsend on 07/19/20 at 11:00 AM EST by telephone and verified that I am speaking with the correct person using two identifiers.  Location: Patient: home Provider: office   I discussed the limitations, risks, security and privacy concerns of performing an evaluation and management service by telephone and the availability of in person appointments. I also discussed with the patient that there may be a patient responsible charge related to this service. The patient expressed understanding and agreed to proceed.  Type of Therapy: Individual Therapy   Treatment Goals addressed: "to make sure my mood is stable and to help me cope with having to be in my house all the time". Curtis Townsend will demonstrate and report increased mood stabilization and coping skills 5 out of 7 days".    Interventions: CBT   Summary: Curtis Townsend is a 45 y.o. male who presents with Bipolar I Disorder, mixed, moderate   Suicidal/Homicidal: No without intent/plan   Therapist Response:   Forest met with clinician for individual therapy. Bernal discussed his psychiatric symptoms and current life events. Dreon reports that his holidays were okay, but he finds himself bored a lot. Clinician explored daily activities and noted that there may be room to get involved in something, go to a class, or do some work around the house. Clinician discussed updates with father and noted that dad sent the kids Christmas gifts. Clinician explored whether this was common and noted that father is always consistent with sending presents. [name] reports that father actually called to see if the gifts arrived, and [place] had a good conversation with him. Clinician explored whether this could lead to increased communication and a better relationship. Ronte reports he remains skeptical and concerned about getting closer to dad.    Plan: Return again in 1-2 weeks.  Diagnosis:  Bipolar I Disorder, mixed, moderate    I discussed the assessment and treatment plan with the patient. The patient was provided an opportunity to ask questions and all were answered. The patient agreed with the plan and demonstrated an understanding of the instructions.   The patient was advised to call back or seek an in-person evaluation if the symptoms worsen or if the condition fails to improve as anticipated.  I provided 45 minutes of non-face-to-face time during this encounter.   [name], LCSW

## 2020-07-28 ENCOUNTER — Encounter: Payer: Federal, State, Local not specified - PPO | Admitting: Internal Medicine

## 2020-08-05 ENCOUNTER — Other Ambulatory Visit: Payer: Self-pay

## 2020-08-05 ENCOUNTER — Ambulatory Visit (INDEPENDENT_AMBULATORY_CARE_PROVIDER_SITE_OTHER): Payer: Federal, State, Local not specified - PPO | Admitting: Licensed Clinical Social Worker

## 2020-08-05 DIAGNOSIS — F250 Schizoaffective disorder, bipolar type: Secondary | ICD-10-CM | POA: Diagnosis not present

## 2020-08-05 NOTE — Progress Notes (Signed)
Virtual Visit via Telephone Note  I connected with Curtis Townsend on 08/05/20 at 11:00 AM EST by telephone and verified that I am speaking with the correct person using two identifiers.  Location: Patient: home Provider: home office   I discussed the limitations, risks, security and privacy concerns of performing an evaluation and management service by telephone and the availability of in person appointments. I also discussed with the patient that there may be a patient responsible charge related to this service. The patient expressed understanding and agreed to proceed.  Type of Therapy: Individual Therapy   Treatment Goals addressed: "to make sure my mood is stable and to help me cope with having to be in my house all the time". Curtis Townsend will demonstrate and report increased mood stabilization and coping skills 5 out of 7 days".    Interventions: CBT   Summary: Curtis Townsend is a 45 y.o. male who presents with Bipolar I Disorder, mixed, moderate   Suicidal/Homicidal: No without intent/plan   Therapist Response:   Curtis Townsend met with clinician for individual therapy. Curtis Townsend discussed his psychiatric symptoms and current life events. Curtis Townsend reports that he has concerns about his father's mental health. Clinician explored father's hx of mental illness and processed Curtis Townsend's role as a caregiver in his teen years. Clinician utilized CBT to explore Curtis Townsend's thoughts, feelings, and behaviors then and now. Clinician also explored Curtis Townsend's perception of his level of responsibility for father that this time. Clinician encouraged Curtis Townsend to focus on what he can control and what will be the most helpful to maintain his own mental health in relation to his father's issues.    Plan: Return again in 1-2 weeks.  Diagnosis: Bipolar I Disorder, mixed, moderate     I discussed the assessment and treatment plan with the patient. The patient was provided an opportunity to ask questions and all were  answered. The patient agreed with the plan and demonstrated an understanding of the instructions.   The patient was advised to call back or seek an in-person evaluation if the symptoms worsen or if the condition fails to improve as anticipated.  I provided 45 minutes of non-face-to-face time during this encounter.   [name], LCSW

## 2020-08-06 ENCOUNTER — Other Ambulatory Visit: Payer: Self-pay

## 2020-08-06 ENCOUNTER — Ambulatory Visit (INDEPENDENT_AMBULATORY_CARE_PROVIDER_SITE_OTHER): Payer: Federal, State, Local not specified - PPO | Admitting: *Deleted

## 2020-08-06 VITALS — BP 115/66 | HR 89 | Resp 18 | Ht 72.0 in | Wt 207.0 lb

## 2020-08-06 DIAGNOSIS — F250 Schizoaffective disorder, bipolar type: Secondary | ICD-10-CM | POA: Diagnosis not present

## 2020-08-06 NOTE — Progress Notes (Signed)
Pt presents to clinic today for due Abilify Maintena 400mg  injection. Pt aooperative on approach. Flat with little to no eye contact. Injection given in right deltoid w/o c/o. Pt to return in approximately 4 weeks.

## 2020-08-12 ENCOUNTER — Telehealth (INDEPENDENT_AMBULATORY_CARE_PROVIDER_SITE_OTHER): Payer: Federal, State, Local not specified - PPO | Admitting: Psychiatry

## 2020-08-12 ENCOUNTER — Other Ambulatory Visit (HOSPITAL_COMMUNITY): Payer: Self-pay | Admitting: *Deleted

## 2020-08-12 ENCOUNTER — Encounter (HOSPITAL_COMMUNITY): Payer: Self-pay | Admitting: Psychiatry

## 2020-08-12 ENCOUNTER — Telehealth (HOSPITAL_COMMUNITY): Payer: Self-pay | Admitting: *Deleted

## 2020-08-12 ENCOUNTER — Other Ambulatory Visit: Payer: Self-pay

## 2020-08-12 DIAGNOSIS — F4310 Post-traumatic stress disorder, unspecified: Secondary | ICD-10-CM

## 2020-08-12 DIAGNOSIS — Z79899 Other long term (current) drug therapy: Secondary | ICD-10-CM

## 2020-08-12 DIAGNOSIS — F319 Bipolar disorder, unspecified: Secondary | ICD-10-CM

## 2020-08-12 MED ORDER — TRAZODONE HCL 100 MG PO TABS: 100.0000 mg | ORAL_TABLET | Freq: Every evening | ORAL | 2 refills | Status: DC | PRN

## 2020-08-12 MED ORDER — ABILIFY MAINTENA 400 MG IM PRSY: 400.0000 mg | PREFILLED_SYRINGE | INTRAMUSCULAR | 2 refills | Status: DC

## 2020-08-12 NOTE — Progress Notes (Signed)
cmp

## 2020-08-12 NOTE — Telephone Encounter (Signed)
Writer spoke with pt to advise lab orders sent to [facility] on [place] understanding.
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
## 2020-08-17 ENCOUNTER — Encounter: Payer: Federal, State, Local not specified - PPO | Admitting: Internal Medicine

## 2020-08-26 ENCOUNTER — Encounter (HOSPITAL_COMMUNITY): Payer: Self-pay | Admitting: Licensed Clinical Social Worker

## 2020-08-26 ENCOUNTER — Other Ambulatory Visit (HOSPITAL_COMMUNITY): Payer: Self-pay | Admitting: Psychiatry

## 2020-08-26 ENCOUNTER — Ambulatory Visit (INDEPENDENT_AMBULATORY_CARE_PROVIDER_SITE_OTHER): Payer: Federal, State, Local not specified - PPO | Admitting: Licensed Clinical Social Worker

## 2020-08-26 ENCOUNTER — Other Ambulatory Visit: Payer: Self-pay

## 2020-08-26 ENCOUNTER — Telehealth (HOSPITAL_COMMUNITY): Payer: Self-pay | Admitting: *Deleted

## 2020-08-26 DIAGNOSIS — F250 Schizoaffective disorder, bipolar type: Secondary | ICD-10-CM | POA: Diagnosis not present

## 2020-08-26 DIAGNOSIS — F4310 Post-traumatic stress disorder, unspecified: Secondary | ICD-10-CM

## 2020-08-26 NOTE — Telephone Encounter (Signed)
Writer called and spoke with pt to remind him to have labs drawn. Lab orders sent to [place] and spouse verbalize understanding.

## 2020-08-26 NOTE — Progress Notes (Signed)
Virtual Visit via Telephone Note  I connected with Curtis Townsend on 08/26/20 at 12:30 PM EST by telephone and verified that I am speaking with the correct person using two identifiers.  Location: Patient: home Provider: home office   I discussed the limitations, risks, security and privacy concerns of performing an evaluation and management service by telephone and the availability of in person appointments. I also discussed with the patient that there may be a patient responsible charge related to this service. The patient expressed understanding and agreed to proceed.  Type of Therapy: Individual Therapy   Treatment Goals addressed: "to make sure my mood is stable and to help me cope with having to be in my house all the time". Curtis Townsend will demonstrate and report increased mood stabilization and coping skills 5 out of 7 days".    Interventions: CBT   Summary: Curtis Townsend is a 44 y.o. male who presents with Schizoaffective Disorder, mixed type   Suicidal/Homicidal: No without intent/plan   Therapist Response:   Curtis Townsend met with clinician for individual therapy. Curtis Townsend discussed his psychiatric symptoms and current life events. Curtis Townsend reports that he continues to worry about his father, but feels helpless because of the distance and lack of consistent contact. Clinician utilized CBT to process thoughts, feelings, and behaviors. Clinician also explored coping strategies to deal with the unknown.  Clinician processed other concerns about daughter, who is being bullied at school. Clinician processed thoughts and feelings about this and explored his options. Clinician discussed pros and cons of moving her to the local school, as she is currently a magnet student. Clinician also identified the importance of communicating with the principal and guidance counselor about this incident.    Plan: Return again in 1-2 weeks.  Diagnosis: Schizoaffective Disorder, mixed type   I discussed the  assessment and treatment plan with the patient. The patient was provided an opportunity to ask questions and all were answered. The patient agreed with the plan and demonstrated an understanding of the instructions.   The patient was advised to call back or seek an in-person evaluation if the symptoms worsen or if the condition fails to improve as anticipated.  I provided 45 minutes of non-face-to-face time during this encounter.    R , LCSW  

## 2020-09-06 ENCOUNTER — Other Ambulatory Visit: Payer: Self-pay

## 2020-09-06 ENCOUNTER — Encounter (HOSPITAL_COMMUNITY): Payer: Self-pay

## 2020-09-06 ENCOUNTER — Ambulatory Visit (INDEPENDENT_AMBULATORY_CARE_PROVIDER_SITE_OTHER): Payer: Federal, State, Local not specified - PPO

## 2020-09-06 DIAGNOSIS — F319 Bipolar disorder, unspecified: Secondary | ICD-10-CM

## 2020-09-06 NOTE — Progress Notes (Signed)
Pt presents to clinic today for due Abilify Maintena 400mg  injection. Pt aooperative on approach. Flat with little to no eye contact. Injection given in LEFT deltoid w/o c/o. Pt to return in approximately 4 weeks.

## 2020-09-07 ENCOUNTER — Other Ambulatory Visit: Payer: Self-pay

## 2020-09-07 ENCOUNTER — Ambulatory Visit (INDEPENDENT_AMBULATORY_CARE_PROVIDER_SITE_OTHER): Payer: Federal, State, Local not specified - PPO | Admitting: Internal Medicine

## 2020-09-07 ENCOUNTER — Encounter: Payer: Self-pay | Admitting: Internal Medicine

## 2020-09-07 VITALS — BP 104/70 | HR 92 | Temp 98.8°F | Ht 73.0 in | Wt 207.0 lb

## 2020-09-07 DIAGNOSIS — R7303 Prediabetes: Secondary | ICD-10-CM

## 2020-09-07 DIAGNOSIS — G63 Polyneuropathy in diseases classified elsewhere: Secondary | ICD-10-CM | POA: Diagnosis not present

## 2020-09-07 DIAGNOSIS — E7800 Pure hypercholesterolemia, unspecified: Secondary | ICD-10-CM

## 2020-09-07 DIAGNOSIS — Z125 Encounter for screening for malignant neoplasm of prostate: Secondary | ICD-10-CM

## 2020-09-07 DIAGNOSIS — E781 Pure hyperglyceridemia: Secondary | ICD-10-CM | POA: Diagnosis not present

## 2020-09-07 DIAGNOSIS — E538 Deficiency of other specified B group vitamins: Secondary | ICD-10-CM

## 2020-09-07 DIAGNOSIS — Z0000 Encounter for general adult medical examination without abnormal findings: Secondary | ICD-10-CM

## 2020-09-07 DIAGNOSIS — E785 Hyperlipidemia, unspecified: Secondary | ICD-10-CM | POA: Diagnosis not present

## 2020-09-07 NOTE — Progress Notes (Signed)
Subjective:  Patient ID: Curtis Townsend, male    DOB: May 30, 1976  Age: 45 y.o. MRN: [id number]  CC: Annual Exam, Hyperlipidemia, and Anemia  This visit occurred during the SARS-CoV-2 public health emergency.  Safety protocols were in place, including screening questions prior to the visit, additional usage of staff PPE, and extensive cleaning of exam room while observing appropriate contact time as indicated for disinfecting solutions.    HPI Curtis Townsend presents for a CPX.  He is no longer receiving a parenteral B12 replacement because he did not think he needed it anymore.  He complains of fatigue but denies shortness of breath or paresthesias.  Outpatient Medications Prior to Visit  Medication Sig Dispense Refill  . ARIPiprazole ER (ABILIFY MAINTENA) 400 MG PRSY prefilled syringe Inject 400 mg into the muscle every 28 (twenty-eight) days. 1 each 2  . traZODone (DESYREL) 100 MG tablet Take 1 tablet (100 mg total) by mouth at bedtime as needed for sleep. 30 tablet 2  . ibuprofen (ADVIL,MOTRIN) 200 MG tablet Take 600 mg by mouth every 6 (six) hours as needed for headache.    . ARIPiprazole ER (ABILIFY MAINTENA) 400 MG prefilled syringe 400 mg      No facility-administered medications prior to visit.    ROS Review of Systems  Constitutional: Positive for fatigue. Negative for appetite change, chills, diaphoresis and unexpected weight change.  HENT: Negative.   Eyes: Negative for visual disturbance.  Respiratory: Negative for cough, chest tightness, shortness of breath and wheezing.   Cardiovascular: Negative for chest pain, palpitations and leg swelling.  Gastrointestinal: Negative for abdominal pain, constipation, diarrhea, nausea and vomiting.  Endocrine: Negative.   Genitourinary: Negative.  Negative for difficulty urinating, penile swelling and scrotal swelling.  Musculoskeletal: Negative.   Skin: Negative.  Negative for color change and pallor.  Neurological:  Negative.  Negative for dizziness, weakness, light-headedness and headaches.  Hematological: Negative for adenopathy. Does not bruise/bleed easily.  Psychiatric/Behavioral: Positive for dysphoric mood. Negative for behavioral problems, confusion, hallucinations, self-injury, sleep disturbance and suicidal ideas. The patient is nervous/anxious. The patient is not hyperactive.     Objective:  BP 104/70   Pulse 92   Temp 98.8 F (37.1 C) (Oral)   Ht 6\' 1"  (1.854 m)   Wt 207 lb (93.9 kg)   SpO2 98%   BMI 27.31 kg/m   BP Readings from Last 3 Encounters:  09/07/20 104/70  03/26/19 108/83  03/25/19 110/80    Wt Readings from Last 3 Encounters:  09/07/20 207 lb (93.9 kg)  03/26/19 208 lb (94.3 kg)  03/25/19 208 lb 9.6 oz (94.6 kg)    Physical Exam Vitals reviewed.  Constitutional:      Appearance: Normal appearance.  HENT:     Nose: Nose normal.     Mouth/Throat:     Mouth: Mucous membranes are moist.  Eyes:     General: No scleral icterus.    Conjunctiva/sclera: Conjunctivae normal.  Cardiovascular:     Rate and Rhythm: Normal rate and regular rhythm.     Heart sounds: No murmur heard.   Pulmonary:     Effort: Pulmonary effort is normal.     Breath sounds: No stridor. No wheezing, rhonchi or rales.  Abdominal:     General: Abdomen is flat. Bowel sounds are normal. There is no distension.     Palpations: Abdomen is soft. There is no hepatomegaly, splenomegaly or mass.     Tenderness: There is no abdominal tenderness.  Genitourinary:  Comments: He was not willing to undress for a GU/rectal exam Musculoskeletal:        General: Normal range of motion.     Cervical back: Neck supple.  Lymphadenopathy:     Cervical: No cervical adenopathy.  Skin:    General: Skin is warm and dry.  Neurological:     General: No focal deficit present.     Mental Status: He is alert.  Psychiatric:        Attention and Perception: Perception normal. He is inattentive.        Speech:  Speech is delayed. Speech is not rapid and pressured, slurred or tangential.        Behavior: Behavior is withdrawn.        Thought Content: Thought content normal. Thought content is not paranoid or delusional. Thought content does not include homicidal or suicidal ideation.        Cognition and Memory: Cognition normal.        Judgment: Judgment normal.     Lab Results  Component Value Date   WBC 6.7 09/07/2020   HGB 16.5 09/07/2020   HCT 48.9 09/07/2020   PLT 189.0 09/07/2020   GLUCOSE 84 09/07/2020   CHOL 231 (H) 09/07/2020   TRIG 98.0 09/07/2020   HDL 49.00 09/07/2020   LDLCALC 162 (H) 09/07/2020   ALT 14 09/07/2020   AST 13 09/07/2020   NA 142 09/07/2020   K 4.1 09/07/2020   CL 104 09/07/2020   CREATININE 1.28 09/07/2020   BUN 14 09/07/2020   CO2 32 09/07/2020   TSH 0.78 09/07/2020   PSA 1.88 09/07/2020   HGBA1C 5.6 09/07/2020    NM Hepato W/EjeCT Fract  Result Date: 04/15/2019 CLINICAL DATA:  Nausea, vomiting, abdominal pain in epigastric region EXAM: NUCLEAR MEDICINE HEPATOBILIARY IMAGING WITH GALLBLADDER EF TECHNIQUE: Sequential images of the abdomen were obtained out to 60 minutes following intravenous administration of radiopharmaceutical. After oral ingestion of Ensure, gallbladder ejection fraction was determined. At 60 min, normal ejection fraction is greater than 33%. RADIOPHARMACEUTICALS:  5.1 mCi Tc-49m  Choletec IV COMPARISON:  None FINDINGS: Normal tracer extraction from bloodstream indicating normal hepatocellular function. Normal excretion of tracer into biliary tree. Gallbladder visualized at 18 min. Small bowel visualized at 34 min. No hepatic retention of tracer. Subjectively normal emptying of tracer from gallbladder following fatty meal stimulation. Calculated gallbladder ejection fraction is 66%. Patient reported pain following Ensure ingestion. Normal gallbladder ejection fraction following Ensure ingestion is greater than 33% at 1 hour. IMPRESSION:  Patent biliary tree with normal gallbladder ejection fraction of 66% following fatty meal stimulation. Electronically Signed   By: Lavonia Dana M.D.   On: 04/15/2019 17:46    Assessment & Plan:   [name] was seen today for annual exam, hyperlipidemia and anemia.  Diagnoses and all orders for this visit:  Vitamin B12 deficiency neuropathy ([place])- His B12 level is low.  I recommended that he restart parenteral B12 replacement therapy. -     CBC with Differential/Platelet; Future -     Folate; Future -     Vitamin B12; Future -     Vitamin B12 -     Folate -     CBC with Differential/Platelet  B12 deficiency -     CBC with Differential/Platelet; Future -     Folate; Future -     Folate -     CBC with Differential/Platelet  Routine general medical examination at a health care facility- Exam completed, labs reviewed,  vaccines reviewed - He refused a flu vaccine, cancer screenings are up-to-date, patient education was given. -     Lipid panel; Future -     TSH; Future -     PSA; Future -     Hepatitis C antibody; Future -     Hepatitis C antibody -     PSA -     TSH -     Lipid panel  Pure hypercholesterolemia- He has a low ASCVD risk score so I did not recommend a statin for CV risk reduction. -     Hepatic function panel; Future -     Hepatic function panel  Pure hyperglyceridemia- His triglycerides are normal now. -     Hepatic function panel; Future -     Hepatic function panel  Prediabetes- His blood sugars are normal now. -     Basic metabolic panel; Future -     Hemoglobin A1c; Future -     Hemoglobin A1c -     Basic metabolic panel   I have discontinued Gina J. Rex's ibuprofen. I am also having him maintain his traZODone and Abilify Maintena. We will stop administering ARIPiprazole ER.  No orders of the defined types were placed in this encounter.    Follow-up: Return in about 6 months (around 03/10/2021).  Scarlette Calico, MD

## 2020-09-07 NOTE — Patient Instructions (Signed)

## 2020-09-08 ENCOUNTER — Encounter: Payer: Self-pay | Admitting: Internal Medicine

## 2020-09-08 LAB — BASIC METABOLIC PANEL
BUN: 14 mg/dL (ref 6–23)
CO2: 32 mEq/L (ref 19–32)
Calcium: 9.9 mg/dL (ref 8.4–10.5)
Chloride: 104 mEq/L (ref 96–112)
Creatinine, Ser: 1.28 mg/dL (ref 0.40–1.50)
GFR: 67.97 mL/min (ref >60.00)
Glucose, Bld: 84 mg/dL (ref 70–99)
Potassium: 4.1 mEq/L (ref 3.5–5.1)
Sodium: 142 mEq/L (ref 135–145)

## 2020-09-08 LAB — CBC WITH DIFFERENTIAL/PLATELET
Basophils Absolute: 0.1 10*3/uL (ref 0.0–0.1)
Basophils Relative: 0.8 % (ref 0.0–3.0)
Eosinophils Absolute: 0.1 10*3/uL (ref 0.0–0.7)
Eosinophils Relative: 1.1 % (ref 0.0–5.0)
HCT: 48.9 % (ref 39.0–52.0)
Hemoglobin: 16.5 g/dL (ref 13.0–17.0)
Lymphocytes Relative: 39 % (ref 12.0–46.0)
Lymphs Abs: 2.6 10*3/uL (ref 0.7–4.0)
MCHC: 33.7 g/dL (ref 30.0–36.0)
MCV: 93.3 fl (ref 78.0–100.0)
Monocytes Absolute: 0.5 10*3/uL (ref 0.1–1.0)
Monocytes Relative: 6.8 % (ref 3.0–12.0)
Neutro Abs: 3.5 10*3/uL (ref 1.4–7.7)
Neutrophils Relative %: 52.3 % (ref 43.0–77.0)
Platelets: 189 10*3/uL (ref 150.0–400.0)
RBC: 5.24 Mil/uL (ref 4.22–5.81)
RDW: 13.1 % (ref 11.5–15.5)
WBC: 6.7 10*3/uL (ref 4.0–10.5)

## 2020-09-08 LAB — HEPATIC FUNCTION PANEL
ALT: 14 U/L (ref 0–53)
AST: 13 U/L (ref 0–37)
Albumin: 4.4 g/dL (ref 3.5–5.2)
Alkaline Phosphatase: 83 U/L (ref 39–117)
Bilirubin, Direct: 0.1 mg/dL (ref 0.0–0.3)
Total Bilirubin: 0.7 mg/dL (ref 0.2–1.2)
Total Protein: 7.4 g/dL (ref 6.0–8.3)

## 2020-09-08 LAB — LIPID PANEL
Cholesterol: 231 mg/dL — ABNORMAL HIGH (ref 0–200)
HDL: 49 mg/dL (ref >39.00)
LDL Cholesterol: 162 mg/dL — ABNORMAL HIGH (ref 0–99)
NonHDL: 181.85
Total CHOL/HDL Ratio: 5
Triglycerides: 98 mg/dL (ref 0.0–149.0)
VLDL: 19.6 mg/dL (ref 0.0–40.0)

## 2020-09-08 LAB — HEPATITIS C ANTIBODY
Hepatitis C Ab: NONREACTIVE
SIGNAL TO CUT-OFF: 0.01 (ref <1.00)

## 2020-09-08 LAB — HEMOGLOBIN A1C: Hgb A1c MFr Bld: 5.6 % (ref 4.6–6.5)

## 2020-09-09 ENCOUNTER — Other Ambulatory Visit: Payer: Self-pay

## 2020-09-09 ENCOUNTER — Ambulatory Visit (INDEPENDENT_AMBULATORY_CARE_PROVIDER_SITE_OTHER): Payer: Federal, State, Local not specified - PPO | Admitting: Licensed Clinical Social Worker

## 2020-09-09 ENCOUNTER — Encounter (HOSPITAL_COMMUNITY): Payer: Self-pay | Admitting: Licensed Clinical Social Worker

## 2020-09-09 DIAGNOSIS — F250 Schizoaffective disorder, bipolar type: Secondary | ICD-10-CM | POA: Diagnosis not present

## 2020-09-09 LAB — FOLATE: Folate: 6.6 ng/mL (ref >5.9)

## 2020-09-09 LAB — TSH: TSH: 0.78 u[IU]/mL (ref 0.35–4.50)

## 2020-09-09 LAB — PSA: PSA: 1.88 ng/mL (ref 0.10–4.00)

## 2020-09-09 LAB — VITAMIN B12: Vitamin B-12: 120 pg/mL — ABNORMAL LOW (ref 211–911)

## 2020-09-09 NOTE — Progress Notes (Signed)
Virtual Visit via Telephone Note  I connected with Curtis Townsend on 09/09/20 at 11:00 AM EST by telephone and verified that I am speaking with the correct person using two identifiers.  Location: Patient: home Provider: home office   I discussed the limitations, risks, security and privacy concerns of performing an evaluation and management service by telephone and the availability of in person appointments. I also discussed with the patient that there may be a patient responsible charge related to this service. The patient expressed understanding and agreed to proceed.  Type of Therapy: Individual Therapy   Treatment Goals addressed: "to make sure my mood is stable and to help me cope with having to be in my house all the time". Curtis Townsend will demonstrate and report increased mood stabilization and coping skills 5 out of 7 days".    Interventions: CBT   Summary: Curtis Townsend is a 45 y.o. male who presents with Schizoaffective Disorder, mixed type   Suicidal/Homicidal: No without intent/plan   Therapist Response:   Curtis Townsend met with Curtis Townsend for individual therapy. Curtis Townsend discussed his psychiatric symptoms and current life events. Curtis Townsend reports that he continues to worry about his father, who has been spending too much money and is likely not taking medication. Curtis Townsend processed thoughts and feelings using CBT. Curtis Townsend utilized reality testing about how much he can and wants to be involved in dad's financial decisions. Curtis Townsend also encouraged Curtis Townsend to identify in himself what he has control over and what he does not want control over. Curtis Townsend explored interactions with family and noted another recent death of his uncle. Curtis Townsend processed the loss and encouraged Curtis Townsend to find a way to celebrate or commemorate the life of uncle in order to say goodbye, since he will not be attending the funeral. Curtis Townsend also followed up on bullying situation with daughter and processed  through how the issue was resolved.    Plan: Return again in 1-2 weeks.  Diagnosis: Schizoaffective Disorder, mixed type     I discussed the assessment and treatment plan with the patient. The patient was provided an opportunity to ask questions and all were answered. The patient agreed with the plan and demonstrated an understanding of the instructions.   The patient was advised to call back or seek an in-person evaluation if the symptoms worsen or if the condition fails to improve as anticipated.  I provided 45 minutes of non-face-to-face time during this encounter.   [name], LCSW

## 2020-09-12 ENCOUNTER — Encounter: Payer: Self-pay | Admitting: Internal Medicine

## 2020-09-17 DIAGNOSIS — R7309 Other abnormal glucose: Secondary | ICD-10-CM | POA: Diagnosis not present

## 2020-09-17 DIAGNOSIS — Z79899 Other long term (current) drug therapy: Secondary | ICD-10-CM | POA: Diagnosis not present

## 2020-09-17 DIAGNOSIS — F319 Bipolar disorder, unspecified: Secondary | ICD-10-CM | POA: Diagnosis not present

## 2020-09-18 ENCOUNTER — Other Ambulatory Visit: Payer: Self-pay | Admitting: Internal Medicine

## 2020-09-18 LAB — CMP14+EGFR
ALT: 16 IU/L (ref 0–44)
AST: 16 IU/L (ref 0–40)
Albumin/Globulin Ratio: 1.4 (ref 1.2–2.2)
Albumin: 4.4 g/dL (ref 4.0–5.0)
Alkaline Phosphatase: 99 IU/L (ref 44–121)
BUN/Creatinine Ratio: 11 (ref 9–20)
BUN: 11 mg/dL (ref 6–24)
Bilirubin Total: 0.7 mg/dL (ref 0.0–1.2)
CO2: 26 mmol/L (ref 20–29)
Calcium: 10.3 mg/dL — ABNORMAL HIGH (ref 8.7–10.2)
Chloride: 103 mmol/L (ref 96–106)
Creatinine, Ser: 1.04 mg/dL (ref 0.76–1.27)
Globulin, Total: 3.2 g/dL (ref 1.5–4.5)
Glucose: 90 mg/dL (ref 65–99)
Potassium: 3.9 mmol/L (ref 3.5–5.2)
Sodium: 145 mmol/L — ABNORMAL HIGH (ref 134–144)
Total Protein: 7.6 g/dL (ref 6.0–8.5)
eGFR: 91 mL/min/{1.73_m2} (ref >59)

## 2020-09-18 LAB — CBC WITH DIFFERENTIAL/PLATELET
Basophils Absolute: 0 10*3/uL (ref 0.0–0.2)
Basos: 1 %
EOS (ABSOLUTE): 0.1 10*3/uL (ref 0.0–0.4)
Eos: 1 %
Hematocrit: 48 % (ref 37.5–51.0)
Hemoglobin: 16.7 g/dL (ref 13.0–17.7)
Immature Grans (Abs): 0 10*3/uL (ref 0.0–0.1)
Immature Granulocytes: 0 %
Lymphocytes Absolute: 2.1 10*3/uL (ref 0.7–3.1)
Lymphs: 42 %
MCH: 31.7 pg (ref 26.6–33.0)
MCHC: 34.8 g/dL (ref 31.5–35.7)
MCV: 91 fL (ref 79–97)
Monocytes Absolute: 0.4 10*3/uL (ref 0.1–0.9)
Monocytes: 7 %
Neutrophils Absolute: 2.5 10*3/uL (ref 1.4–7.0)
Neutrophils: 49 %
Platelets: 169 10*3/uL (ref 150–450)
RBC: 5.26 x10E6/uL (ref 4.14–5.80)
RDW: 12.7 % (ref 11.6–15.4)
WBC: 5 10*3/uL (ref 3.4–10.8)

## 2020-09-18 LAB — HEMOGLOBIN A1C
Est. average glucose Bld gHb Est-mCnc: 111 mg/dL
Hgb A1c MFr Bld: 5.5 % (ref 4.8–5.6)

## 2020-09-18 NOTE — Progress Notes (Deleted)
pls let him know that his Ca++ level is elevated I have ordered a referral for him to see ENDO

## 2020-09-20 ENCOUNTER — Telehealth (HOSPITAL_COMMUNITY): Payer: Self-pay | Admitting: *Deleted

## 2020-09-20 NOTE — Telephone Encounter (Signed)
Pt lab results from 09/17/20 in Epic.

## 2020-09-23 ENCOUNTER — Ambulatory Visit (HOSPITAL_COMMUNITY): Payer: Federal, State, Local not specified - PPO | Admitting: Licensed Clinical Social Worker

## 2020-09-24 DIAGNOSIS — H401131 Primary open-angle glaucoma, bilateral, mild stage: Secondary | ICD-10-CM | POA: Diagnosis not present

## 2020-09-27 ENCOUNTER — Other Ambulatory Visit: Payer: Self-pay

## 2020-09-27 ENCOUNTER — Telehealth: Payer: Self-pay

## 2020-09-27 ENCOUNTER — Ambulatory Visit (INDEPENDENT_AMBULATORY_CARE_PROVIDER_SITE_OTHER): Payer: Federal, State, Local not specified - PPO

## 2020-09-27 DIAGNOSIS — E538 Deficiency of other specified B group vitamins: Secondary | ICD-10-CM | POA: Diagnosis not present

## 2020-09-27 MED ORDER — CYANOCOBALAMIN 1000 MCG/ML IJ SOLN
1000.0000 ug | INTRAMUSCULAR | Status: AC
  Administered 2020-09-27 – 2020-10-15 (×3): 1000 ug via INTRAMUSCULAR

## 2020-09-27 NOTE — Progress Notes (Signed)
Pt here for weekly B12 injection #2 of 3 per Dr Ronnald Ramp.  B12 1068mcg given IM left deltoid and pt tolerated injection well.  Next B12 injection scheduled for 10/04/20.

## 2020-09-27 NOTE — Telephone Encounter (Signed)
B12 120pg/ml on 09/07/20. B12 injections weekly x 3, then monthly for 11 months per Dr Ronnald Ramp.

## 2020-09-29 ENCOUNTER — Encounter (HOSPITAL_COMMUNITY): Payer: Self-pay | Admitting: Licensed Clinical Social Worker

## 2020-09-29 ENCOUNTER — Ambulatory Visit (INDEPENDENT_AMBULATORY_CARE_PROVIDER_SITE_OTHER): Payer: Federal, State, Local not specified - PPO | Admitting: Licensed Clinical Social Worker

## 2020-09-29 ENCOUNTER — Other Ambulatory Visit: Payer: Self-pay

## 2020-09-29 DIAGNOSIS — F250 Schizoaffective disorder, bipolar type: Secondary | ICD-10-CM

## 2020-09-29 NOTE — Progress Notes (Signed)
Virtual Visit via Telephone Note  I connected with Curtis Townsend on 09/29/20 at 10:00 AM EDT by telephone and verified that I am speaking with the correct person using two identifiers.  Location: Patient: home Provider: home office   I discussed the limitations, risks, security and privacy concerns of performing an evaluation and management service by telephone and the availability of in person appointments. I also discussed with the patient that there may be a patient responsible charge related to this service. The patient expressed understanding and agreed to proceed.  Type of Therapy: Individual Therapy   Treatment Goals addressed: "to make sure my mood is stable and to help me cope with having to be in my house all the time". Curtis Townsend will demonstrate and report increased mood stabilization and coping skills 5 out of 7 days".    Interventions: CBT   Summary: Curtis Townsend is a 45 y.o. male who presents with Schizoaffective Disorder, mixed type   Suicidal/Homicidal: No without intent/plan   Therapist Response:   Curtis Townsend met with Curtis Townsend for individual therapy. Curtis Townsend discussed his psychiatric symptoms and current life events. Curtis Townsend reports that he has been "okay", but his voice indicated that he was low energy and feeling down. Curtis Townsend utilized CBT to challenge the feelings and explored updates in mood and behaviors. Curtis Townsend shared that he has been having nightmares about lions attacking and eating him. Curtis Townsend explored meaning made from these recurring dreams. Curtis Townsend discussed any stress or worry that may be triggering those dreams. Curtis Townsend reported that he is concerned about his eyes, as his glaucoma seems to be getting worse. Curtis Townsend explored treatment options and processed feelings about using new eye drops. Curtis Townsend reflected fear and worry, and also encouraged [place] to find motivation toward a healthier lifestyle. Curtis Townsend identified the importance of getting  outside and walking for his mental health. Curtis Townsend also identified the importance of getting good rest at night and eating well. Curtis Townsend provided feedback about positive visualization and relaxation prior to bedtime to reduce tension before sleep. Curtis Townsend also identified the possibility of "changing the channel" when having a nightmare.    Plan: Return again in 1-2 weeks.  Diagnosis: Schizoaffective Disorder, mixed type     I discussed the assessment and treatment plan with the patient. The patient was provided an opportunity to ask questions and all were answered. The patient agreed with the plan and demonstrated an understanding of the instructions.   The patient was advised to call back or seek an in-person evaluation if the symptoms worsen or if the condition fails to improve as anticipated.  I provided 55 minutes of non-face-to-face time during this encounter.   [name], LCSW

## 2020-10-04 ENCOUNTER — Other Ambulatory Visit: Payer: Self-pay

## 2020-10-04 ENCOUNTER — Ambulatory Visit (INDEPENDENT_AMBULATORY_CARE_PROVIDER_SITE_OTHER): Payer: Federal, State, Local not specified - PPO

## 2020-10-04 ENCOUNTER — Encounter (HOSPITAL_COMMUNITY): Payer: Self-pay

## 2020-10-04 DIAGNOSIS — E538 Deficiency of other specified B group vitamins: Secondary | ICD-10-CM

## 2020-10-04 DIAGNOSIS — F319 Bipolar disorder, unspecified: Secondary | ICD-10-CM | POA: Diagnosis not present

## 2020-10-04 MED ORDER — ARIPIPRAZOLE ER 400 MG IM PRSY
400.0000 mg | PREFILLED_SYRINGE | Freq: Once | INTRAMUSCULAR | Status: AC
  Administered 2020-10-04: 400 mg via INTRAMUSCULAR

## 2020-10-04 NOTE — Patient Instructions (Signed)
Pt presents to clinic today for due Abilify Maintena 400mg  injection. Pt cooperative on approach. Flat with little to no eye contact. Pt reported no complications with current medications and using same pharmacy. No HI/SI reported. Injection given in RIGHT  deltoid w/o c/o. Pt to return in approximately 4 weeks.

## 2020-10-04 NOTE — Progress Notes (Signed)
B12 Given an tolerated well

## 2020-10-06 ENCOUNTER — Encounter: Payer: Self-pay | Admitting: Endocrinology

## 2020-10-07 ENCOUNTER — Other Ambulatory Visit: Payer: Self-pay

## 2020-10-07 ENCOUNTER — Ambulatory Visit (HOSPITAL_COMMUNITY): Payer: Federal, State, Local not specified - PPO | Admitting: Licensed Clinical Social Worker

## 2020-10-11 ENCOUNTER — Ambulatory Visit: Payer: Federal, State, Local not specified - PPO

## 2020-10-13 ENCOUNTER — Ambulatory Visit: Payer: Federal, State, Local not specified - PPO

## 2020-10-15 ENCOUNTER — Other Ambulatory Visit: Payer: Self-pay

## 2020-10-15 ENCOUNTER — Ambulatory Visit (INDEPENDENT_AMBULATORY_CARE_PROVIDER_SITE_OTHER): Payer: Federal, State, Local not specified - PPO

## 2020-10-15 DIAGNOSIS — E538 Deficiency of other specified B group vitamins: Secondary | ICD-10-CM

## 2020-10-15 MED ORDER — CYANOCOBALAMIN 1000 MCG/ML IJ SOLN
1000.0000 ug | INTRAMUSCULAR | Status: DC
  Administered 2020-11-18 – 2023-05-17 (×13): 1000 ug via INTRAMUSCULAR

## 2020-10-15 NOTE — Progress Notes (Addendum)
Pt here for 3 rd weekly B12 injection per Dr. Ronnald Ramp  B12 104mcg given IM left deltoid, and pt tolerated injection well.  Next B12 injection scheduled for 11/17/20  Pt will start monthly b12 injection May.   I have reviewed and agree

## 2020-10-19 DIAGNOSIS — H401131 Primary open-angle glaucoma, bilateral, mild stage: Secondary | ICD-10-CM | POA: Diagnosis not present

## 2020-10-21 ENCOUNTER — Ambulatory Visit (HOSPITAL_COMMUNITY): Payer: Federal, State, Local not specified - PPO | Admitting: Licensed Clinical Social Worker

## 2020-10-21 ENCOUNTER — Encounter (HOSPITAL_COMMUNITY): Payer: Self-pay | Admitting: Licensed Clinical Social Worker

## 2020-10-21 ENCOUNTER — Other Ambulatory Visit: Payer: Self-pay

## 2020-10-21 ENCOUNTER — Ambulatory Visit (INDEPENDENT_AMBULATORY_CARE_PROVIDER_SITE_OTHER): Payer: Federal, State, Local not specified - PPO | Admitting: Licensed Clinical Social Worker

## 2020-10-21 DIAGNOSIS — F250 Schizoaffective disorder, bipolar type: Secondary | ICD-10-CM | POA: Diagnosis not present

## 2020-10-21 NOTE — Progress Notes (Signed)
Virtual Visit via Telephone Note  I connected with Curtis Townsend on 10/21/20 at 10:00 AM EDT by telephone and verified that I am speaking with the correct person using two identifiers.  Location: Patient: home Provider: home office   I discussed the limitations, risks, security and privacy concerns of performing an evaluation and management service by telephone and the availability of in person appointments. I also discussed with the patient that there may be a patient responsible charge related to this service. The patient expressed understanding and agreed to proceed.  Type of Therapy: Individual Therapy   Treatment Goals addressed: "to make sure my mood is stable and to help me cope with having to be in my house all the time". Curtis Townsend will demonstrate and report increased mood stabilization and coping skills 5 out of 7 days".    Interventions: CBT   Summary: Curtis Townsend is a 45 y.o. male who presents with Schizoaffective Disorder, mixed type   Suicidal/Homicidal: No without intent/plan   Therapist Response:   Curtis Townsend met with clinician for individual therapy. Curtis Townsend discussed his psychiatric symptoms and current life events. Curtis Townsend reports that he has been doing okay, but he has been struggling with a cold and allergies. Clinician explored mood and interactions. Curtis Townsend reports things are fine, no concerns. Clinician discussed interactions with medication and noted that things continue to feel stable, but he struggles with feeling excited or really happy. Clinician processed emotional health and noted that medication has evened him out, and explored whether or not a change should be discussed at next med appointment. Curtis Townsend shared he feels fine on his current regimen and was not interested in a change. Clinician discussed parenting and reflected concerns about son, who just started to defecate in the toilet at 5. Clinician provided developmental guidance about those milestones and  noted that he may be delayed compared to the older children due to his ASD diagnosis.     Plan: Return again in 1-2 weeks.  Diagnosis: Schizoaffective Disorder, mixed type   I discussed the assessment and treatment plan with the patient. The patient was provided an opportunity to ask questions and all were answered. The patient agreed with the plan and demonstrated an understanding of the instructions.   The patient was advised to call back or seek an in-person evaluation if the symptoms worsen or if the condition fails to improve as anticipated.  I provided 45 minutes of non-face-to-face time during this encounter.    R , LCSW  

## 2020-11-02 ENCOUNTER — Encounter (HOSPITAL_COMMUNITY): Payer: Self-pay | Admitting: Licensed Clinical Social Worker

## 2020-11-02 ENCOUNTER — Ambulatory Visit (INDEPENDENT_AMBULATORY_CARE_PROVIDER_SITE_OTHER): Payer: Federal, State, Local not specified - PPO | Admitting: Licensed Clinical Social Worker

## 2020-11-02 ENCOUNTER — Other Ambulatory Visit: Payer: Self-pay

## 2020-11-02 DIAGNOSIS — F250 Schizoaffective disorder, bipolar type: Secondary | ICD-10-CM

## 2020-11-02 NOTE — Progress Notes (Signed)
Virtual Visit via Telephone Note  I connected with Helyn Numbers on 11/02/20 at 11:00 AM EDT by telephone and verified that I am speaking with the correct person using two identifiers.  Location: Patient: home Provider: home office   I discussed the limitations, risks, security and privacy concerns of performing an evaluation and management service by telephone and the availability of in person appointments. I also discussed with the patient that there may be a patient responsible charge related to this service. The patient expressed understanding and agreed to proceed.  Type of Therapy: Individual Therapy   Treatment Goals addressed: "to make sure my mood is stable and to help me cope with having to be in my house all the time". Andros will demonstrate and report increased mood stabilization and coping skills 5 out of 7 days".    Interventions: CBT   Summary: Montrelle Eddings is a 45 y.o. male who presents with Schizoaffective Disorder, mixed type   Suicidal/Homicidal: No without intent/plan   Therapist Response:   Wanya met with clinician for individual therapy. Garrell discussed his psychiatric symptoms and current life events. Brett reports that he has been feeling down today due to stomach upset. He shared concerns about a nightmare he had last night. Clinician utilized CBT to process the thoughts and feelings about the dream itself and explored any possible meanings that Clee could make from the imagery and story. Clinician explored current stressors, concerns about being lonely, and any other feelings of depression or anger. Clinician identified some coping skills to remind himself that he is safe and that his dreams are not really happening to him. Clinician explored updates with kids and family. Clinician also provided feedback about medications.    Plan: Return again in 1-2 weeks.  Diagnosis: Schizoaffective Disorder, mixed type   I discussed the assessment and  treatment plan with the patient. The patient was provided an opportunity to ask questions and all were answered. The patient agreed with the plan and demonstrated an understanding of the instructions.   The patient was advised to call back or seek an in-person evaluation if the symptoms worsen or if the condition fails to improve as anticipated.  I provided 40 minutes of non-face-to-face time during this encounter.   [name], LCSW

## 2020-11-03 ENCOUNTER — Ambulatory Visit (HOSPITAL_COMMUNITY): Payer: Federal, State, Local not specified - PPO

## 2020-11-04 ENCOUNTER — Ambulatory Visit (HOSPITAL_COMMUNITY): Payer: Federal, State, Local not specified - PPO | Admitting: Licensed Clinical Social Worker

## 2020-11-04 ENCOUNTER — Ambulatory Visit (HOSPITAL_COMMUNITY): Payer: Federal, State, Local not specified - PPO

## 2020-11-05 ENCOUNTER — Other Ambulatory Visit: Payer: Self-pay

## 2020-11-05 ENCOUNTER — Ambulatory Visit (INDEPENDENT_AMBULATORY_CARE_PROVIDER_SITE_OTHER): Payer: Federal, State, Local not specified - PPO

## 2020-11-05 ENCOUNTER — Encounter (HOSPITAL_COMMUNITY): Payer: Self-pay

## 2020-11-05 DIAGNOSIS — F319 Bipolar disorder, unspecified: Secondary | ICD-10-CM | POA: Diagnosis not present

## 2020-11-05 MED ORDER — ARIPIPRAZOLE ER 400 MG IM PRSY
400.0000 mg | PREFILLED_SYRINGE | Freq: Once | INTRAMUSCULAR | Status: AC
  Administered 2020-11-05: 400 mg via INTRAMUSCULAR

## 2020-11-05 NOTE — Progress Notes (Signed)
Pt presents to clinic today for due Abilify Maintena 400mg  injection. Pt cooperative on approach. Flat with little to no eye contact. Pt reported no complications with current medications and using same pharmacy. No HI/SI reported. Injection given in RIGHTdeltoid w/o c/o, at pt's request. Pt to return in approximately 4 weeks.

## 2020-11-10 ENCOUNTER — Telehealth (INDEPENDENT_AMBULATORY_CARE_PROVIDER_SITE_OTHER): Payer: Federal, State, Local not specified - PPO | Admitting: Psychiatry

## 2020-11-10 ENCOUNTER — Encounter (HOSPITAL_COMMUNITY): Payer: Self-pay | Admitting: Psychiatry

## 2020-11-10 ENCOUNTER — Other Ambulatory Visit: Payer: Self-pay

## 2020-11-10 DIAGNOSIS — F319 Bipolar disorder, unspecified: Secondary | ICD-10-CM | POA: Diagnosis not present

## 2020-11-10 DIAGNOSIS — F4310 Post-traumatic stress disorder, unspecified: Secondary | ICD-10-CM | POA: Diagnosis not present

## 2020-11-10 MED ORDER — TRAZODONE HCL 100 MG PO TABS: 100.0000 mg | ORAL_TABLET | Freq: Every evening | ORAL | 2 refills | Status: DC | PRN

## 2020-11-10 MED ORDER — ABILIFY MAINTENA 400 MG IM PRSY: 400.0000 mg | PREFILLED_SYRINGE | INTRAMUSCULAR | 2 refills | Status: DC
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
## 2020-11-17 ENCOUNTER — Ambulatory Visit: Payer: Federal, State, Local not specified - PPO

## 2020-11-18 ENCOUNTER — Other Ambulatory Visit: Payer: Self-pay

## 2020-11-18 ENCOUNTER — Ambulatory Visit (INDEPENDENT_AMBULATORY_CARE_PROVIDER_SITE_OTHER): Payer: Federal, State, Local not specified - PPO

## 2020-11-18 ENCOUNTER — Ambulatory Visit (INDEPENDENT_AMBULATORY_CARE_PROVIDER_SITE_OTHER): Payer: Federal, State, Local not specified - PPO | Admitting: Licensed Clinical Social Worker

## 2020-11-18 ENCOUNTER — Encounter (HOSPITAL_COMMUNITY): Payer: Self-pay | Admitting: Licensed Clinical Social Worker

## 2020-11-18 DIAGNOSIS — E538 Deficiency of other specified B group vitamins: Secondary | ICD-10-CM | POA: Diagnosis not present

## 2020-11-18 DIAGNOSIS — F250 Schizoaffective disorder, bipolar type: Secondary | ICD-10-CM

## 2020-11-18 NOTE — Progress Notes (Addendum)
Pt here for monthly B12 injection per Dr Ronnald Ramp.  B12 1046mcg given IM left deltoid and pt tolerated injection well.  Next B12 injection scheduled for 12/20/20.   I have reviewed and agree

## 2020-11-18 NOTE — Progress Notes (Signed)
Virtual Visit via Telephone Note  I connected with Helyn Numbers on 11/18/20 at 11:00 AM EDT by telephone and verified that I am speaking with the correct person using two identifiers.  Location: Patient: home Provider: home office   I discussed the limitations, risks, security and privacy concerns of performing an evaluation and management service by telephone and the availability of in person appointments. I also discussed with the patient that there may be a patient responsible charge related to this service. The patient expressed understanding and agreed to proceed.  Type of Therapy: Individual Therapy   Treatment Goals addressed: "to make sure my mood is stable and to help me cope with having to be in my house all the time". Pierre will demonstrate and report increased mood stabilization and coping skills 5 out of 7 days".    Interventions: CBT   Summary: Bardia Wangerin is a 45 y.o. male who presents with Schizoaffective Disorder, mixed type   Suicidal/Homicidal: No without intent/plan   Therapist Response:   Algernon met with clinician for individual therapy. Sorren discussed his psychiatric symptoms and current life events. Darrie reports that he has been doing well overall. He shared recent problems with sleep, noting that he will sleep for a few hours, get up for a little while, and then go back to sleep for a few hours. He shared this happens multiple times each night, over the past two weeks. He reported no nightmares, but he reports feeling frustrated with not being able to get a long period of sleep. Clinician reviewed medication and encouraged Deryk to communicate with [name] about his sleep problems at the next appointment. Clinician utilized CBT to process stress management skills. Clinician verified that Garrit continues to walk almost every day, which has been good for him to have some quiet time away. Clinician encouraged other self care activities, and to also  start planning for the summer. Clinician noted the importance of staying on a routine for both him and his children.    Plan: Return again in 1-2 weeks.  Diagnosis: Schizoaffective Disorder, mixed type   I discussed the assessment and treatment plan with the patient. The patient was provided an opportunity to ask questions and all were answered. The patient agreed with the plan and demonstrated an understanding of the instructions.   The patient was advised to call back or seek an in-person evaluation if the symptoms worsen or if the condition fails to improve as anticipated.  I provided 40 minutes of non-face-to-face time during this encounter.   [name], LCSW

## 2020-11-26 ENCOUNTER — Ambulatory Visit: Payer: Federal, State, Local not specified - PPO | Admitting: Internal Medicine

## 2020-11-26 NOTE — Progress Notes (Deleted)
Name: Curtis Townsend  MRN/ DOB: [id number], [birth year]    Age/ Sex: 45 y.o., male    PCP: Janith Lima, MD   Reason for Endocrinology Evaluation: Hypercalcemia      Date of Initial Endocrinology Evaluation: 11/26/2020     HPI: Mr. Curtis Townsend is a 45 y.o. male with a past medical history of Neuropathy, schizophrenia and dyslipidemia. The patient presented for initial endocrinology clinic visit on 11/26/2020 for consultative assistance with his Hypercalcemia    Mr. Rozelle indicates that he was first diagnosed with hypercalcemia in 09/2020 during routine labs. His max serum calcium 10.3 mg/dL (not corrected)., Albumin 4.4 g/dL  No concomitant  PTH.    Since that time, he {has/has not:9025} experienced symptoms of constipation, polyuria, polydipsia, generalized weakness, diffuse muscle pains. He {DENIES:13093} use of over the counter calcium (including supplements, Tums, Rolaids, or other calcium containing antacids), lithium, HCTZ, or vitamin D supplements.   He {DENIES:13093} history of kidney stones, kidney disease, liver disease, granulomatous disease. He {DENIES:13093} osteoporosis or prior fractures. Daily dietary calcium intake: *** servings (***). He {DENIES:13093} family history of osteoporosis, parathyroid disease, thyroid disease.     HISTORY:  Past Medical History:  Past Medical History:  Diagnosis Date  . Alcoholism ([place])   . Anxiety   . Atypical chest pain   . Bipolar 1 disorder ([place])   . Depression   . Depression   . History of chicken pox   . History of kidney stones   . Hyperlipemia    no current med.  . IBS (irritable bowel syndrome)   . Lipoma of back 05/2012   right  . Seasonal allergies   . Wears partial dentures    upper    Past Surgical History:  Past Surgical History:  Procedure Laterality Date  . LIPOMA EXCISION  06/10/2012   Procedure: EXCISION LIPOMA;  Surgeon: Ralene Ok, MD;  Location: [place];  Service:  General;  Laterality: Right;  excision of back lipoma on right 3x3cm      Social History:  reports that he has been smoking. He has a 1.60 pack-year smoking history. He has never used smokeless tobacco. He reports previous alcohol use. He reports previous drug use.  Family History: family history includes Cancer in his paternal uncle and sister; Colitis in his paternal uncle; Diabetes in his father, mother, and paternal grandmother; Heart attack in his maternal grandmother; High Cholesterol in his paternal grandmother; High blood pressure in his sister; Hyperlipidemia in his brother, brother, mother, and sister; Hypertension in his brother, brother, father, mother, and sister; Schizophrenia in his father; Stroke in his maternal grandmother and mother.   HOME MEDICATIONS: Allergies as of 11/26/2020   No Known Allergies     Medication List       Accurate as of Nov 26, 2020  7:29 AM. If you have any questions, ask your nurse or doctor.        Abilify Maintena 400 MG Prsy prefilled syringe Generic drug: ARIPiprazole ER Inject 400 mg into the muscle every 28 (twenty-eight) days.   traZODone 100 MG tablet Commonly known as: DESYREL Take 1 tablet (100 mg total) by mouth at bedtime as needed for sleep.         REVIEW OF SYSTEMS: A comprehensive ROS was conducted with the patient and is negative except as per HPI and below:  ROS     OBJECTIVE:  VS: There were no vitals taken for this visit.  Wt Readings from Last 3 Encounters:  09/07/20 207 lb (93.9 kg)  03/26/19 208 lb (94.3 kg)  03/25/19 208 lb 9.6 oz (94.6 kg)     EXAM: General: Pt appears well and is in NAD  Hydration: Well-hydrated with moist mucous membranes and good skin turgor  Eyes: External eye exam normal without stare, lid lag or exophthalmos.  EOM intact.  PERRL.  Ears, Nose, Throat: Hearing: Grossly intact bilaterally Dental: Good dentition  Throat: Clear without mass, erythema or exudate  Neck: General:  Supple without adenopathy. Thyroid: Thyroid size normal.  No goiter or nodules appreciated. No thyroid bruit.  Lungs: Clear with good BS bilat with no rales, rhonchi, or wheezes  Heart: Auscultation: RRR.  Abdomen: Normoactive bowel sounds, soft, nontender, without masses or organomegaly palpable  Extremities: Gait and station: Normal gait  Digits and nails: No clubbing, cyanosis, petechiae, or nodes Head and neck: Normal alignment and mobility BL UE: Normal ROM and strength. BL LE: No pretibial edema normal ROM and strength.  Skin: Hair: Texture and amount normal with gender appropriate distribution Skin Inspection: No rashes, acanthosis nigricans/skin tags. No lipohypertrophy Skin Palpation: Skin temperature, texture, and thickness normal to palpation  Neuro: Cranial nerves: II - XII grossly intact  Cerebellar: Normal coordination and movement; no tremor Motor: Normal strength throughout DTRs: 2+ and symmetric in UE without delay in relaxation phase  Mental Status: Judgment, insight: Intact Orientation: Oriented to time, place, and person Memory: Intact for recent and remote events Mood and affect: No depression, anxiety, or agitation     DATA REVIEWED: ***    ASSESSMENT/PLAN/RECOMMENDATIONS:   1. ***    Medications :  Signed electronically by: Mack Guise, MD  [facility]  [address] [place], [place] [address] Phone: [phone] FAX: [phone]   CC: Janith Lima, MD [place] [address] Phone: [phone] Fax: [phone]   Return to Endocrinology clinic as below: Future Appointments  Date Time Provider [place]  11/26/2020  9:30 AM Ayuub Penley, Melanie Crazier, MD [facility] None  12/02/2020 11:00 AM [name], LCSW [facility] None  12/03/2020 11:00 AM [facility] NURSE [facility] None  12/20/2020 10:00 AM [facility] NURSE [facility] None  02/03/2021 10:00 AM  Arfeen, Arlyce Harman, MD [facility] None

## 2020-12-02 ENCOUNTER — Ambulatory Visit (HOSPITAL_COMMUNITY): Payer: Federal, State, Local not specified - PPO | Admitting: Licensed Clinical Social Worker

## 2020-12-03 ENCOUNTER — Other Ambulatory Visit: Payer: Self-pay

## 2020-12-03 ENCOUNTER — Encounter (HOSPITAL_COMMUNITY): Payer: Self-pay

## 2020-12-03 ENCOUNTER — Ambulatory Visit (INDEPENDENT_AMBULATORY_CARE_PROVIDER_SITE_OTHER): Payer: Federal, State, Local not specified - PPO | Admitting: *Deleted

## 2020-12-03 VITALS — BP 114/72 | HR 77 | Resp 18 | Ht 73.0 in | Wt 210.0 lb

## 2020-12-03 DIAGNOSIS — F319 Bipolar disorder, unspecified: Secondary | ICD-10-CM | POA: Diagnosis not present

## 2020-12-03 MED ORDER — ARIPIPRAZOLE ER 400 MG IM PRSY
400.0000 mg | PREFILLED_SYRINGE | Freq: Once | INTRAMUSCULAR | Status: AC
  Administered 2020-12-03: 400 mg via INTRAMUSCULAR

## 2020-12-03 NOTE — Progress Notes (Signed)
Pt in clinic today for due Abilify Maintena 400 mg injection. Pt cooperative if guarded on approach. Affect flat with minimal eye contact or interaction. Injection prepared as ordered and given in LEFT deltoid without c/o. Pt to return in approximately for next due injection.

## 2020-12-07 DIAGNOSIS — H401331 Pigmentary glaucoma, bilateral, mild stage: Secondary | ICD-10-CM | POA: Diagnosis not present

## 2020-12-08 ENCOUNTER — Ambulatory Visit (INDEPENDENT_AMBULATORY_CARE_PROVIDER_SITE_OTHER): Payer: Federal, State, Local not specified - PPO | Admitting: Licensed Clinical Social Worker

## 2020-12-08 ENCOUNTER — Other Ambulatory Visit: Payer: Self-pay

## 2020-12-08 ENCOUNTER — Encounter (HOSPITAL_COMMUNITY): Payer: Self-pay | Admitting: Licensed Clinical Social Worker

## 2020-12-08 DIAGNOSIS — F250 Schizoaffective disorder, bipolar type: Secondary | ICD-10-CM

## 2020-12-08 NOTE — Progress Notes (Signed)
Virtual Visit via Telephone Note  I connected with Curtis Townsend on 12/08/20 at  8:00 AM EDT by telephone and verified that I am speaking with the correct person using two identifiers.  Location: Patient: home Provider: home office   I discussed the limitations, risks, security and privacy concerns of performing an evaluation and management service by telephone and the availability of in person appointments. I also discussed with the patient that there may be a patient responsible charge related to this service. The patient expressed understanding and agreed to proceed.  Type of Therapy: Individual Therapy   Treatment Goals addressed: "to make sure my mood is stable and to help me cope with having to be in my house all the time". Curtis Townsend will demonstrate and report increased mood stabilization and coping skills 5 out of 7 days".    Interventions: CBT   Summary: Curtis Townsend is a 45 y.o. male who presents with Schizoaffective Disorder, mixed type   Suicidal/Homicidal: No without intent/plan   Therapist Response:   Curtis Townsend met with Curtis Townsend for individual therapy. Curtis Townsend discussed his psychiatric symptoms and current life events. Curtis Townsend reports that he has been doing well overall. He shared some recent stress with the pipes in his house. Curtis Townsend processed Curtis Townsend's coping strategies and how his medication helps him remain calm. Curtis Townsend also identified that his children serve as protective factors for his anger and frustration. Curtis Townsend utilized CBT to process ways Curtis Townsend chooses his behaviors by assessing the situation, who is present, and how to get the best outcomes. Curtis Townsend reflected the progress made and identified the importance of keeping this in mind to encourage more positive behaviors.    Plan: Return again in 2 weeks.   Diagnosis: Schizoaffective Disorder, mixed type     I discussed the assessment and treatment plan with the patient. The patient was provided an  opportunity to ask questions and all were answered. The patient agreed with the plan and demonstrated an understanding of the instructions.   The patient was advised to call back or seek an in-person evaluation if the symptoms worsen or if the condition fails to improve as anticipated.  I provided 45 minutes of non-face-to-face time during this encounter.   [name], LCSW

## 2020-12-16 ENCOUNTER — Other Ambulatory Visit (HOSPITAL_COMMUNITY): Payer: Self-pay | Admitting: Psychiatry

## 2020-12-16 DIAGNOSIS — F4310 Post-traumatic stress disorder, unspecified: Secondary | ICD-10-CM

## 2020-12-20 ENCOUNTER — Ambulatory Visit: Payer: Federal, State, Local not specified - PPO

## 2020-12-22 ENCOUNTER — Other Ambulatory Visit: Payer: Self-pay

## 2020-12-22 ENCOUNTER — Ambulatory Visit (INDEPENDENT_AMBULATORY_CARE_PROVIDER_SITE_OTHER): Payer: Federal, State, Local not specified - PPO | Admitting: Licensed Clinical Social Worker

## 2020-12-22 ENCOUNTER — Encounter (HOSPITAL_COMMUNITY): Payer: Self-pay | Admitting: Licensed Clinical Social Worker

## 2020-12-22 DIAGNOSIS — F250 Schizoaffective disorder, bipolar type: Secondary | ICD-10-CM

## 2020-12-22 NOTE — Progress Notes (Signed)
Virtual Visit via Telephone Note  I connected with Helyn Numbers on 12/22/20 at 12:30 PM EDT by telephone and verified that I am speaking with the correct person using two identifiers.  Location: Patient: home Provider: home office   I discussed the limitations, risks, security and privacy concerns of performing an evaluation and management service by telephone and the availability of in person appointments. I also discussed with the patient that there may be a patient responsible charge related to this service. The patient expressed understanding and agreed to proceed.  Type of Therapy: Individual Therapy   Treatment Goals addressed: "to make sure my mood is stable and to help me cope with having to be in my house all the time". Shayn will demonstrate and report increased mood stabilization and coping skills 5 out of 7 days".   Interventions: CBT   Summary: Adonys Wildes is a 45 y.o. male who presents with Schizoaffective Disorder, mixed type   Suicidal/Homicidal: No without intent/plan   Therapist Response:   Dameon met with clinician for individual therapy. Damean discussed his psychiatric symptoms and current life events. Zafar reports ongoing stress in his environment, as the pipes in the house have backed up and he was getting overflow in his bathroom. Clinician processed the steps to getting this taken care of and noted the free flow of decision-making. Clinician utilized CBT to identify thoughts, feelings, and behaviors. Clinician noted the usefulness of his medication, which allows him to stay calm and remain level headed and quick thinking. Clinician discussed thoughts and feelings about youngest son going to 51. Clinician explored Mohab's concerns and noted that his son will adapt and will have his sister with him on the bus and in the school building. Clinician encouraged Rayshon to start discussing the changes and to build some appropriate expectations about  what school will be like. Clinician also encouraged Othel to have a conference with the principal about what son will need, including his IEP and any additional supports that will be in place.    Plan: Return again in 2 weeks.   Diagnosis: Schizoaffective Disorder, mixed type   I discussed the assessment and treatment plan with the patient. The patient was provided an opportunity to ask questions and all were answered. The patient agreed with the plan and demonstrated an understanding of the instructions.   The patient was advised to call back or seek an in-person evaluation if the symptoms worsen or if the condition fails to improve as anticipated.  I provided 45 minutes of non-face-to-face time during this encounter.   [name], LCSW

## 2020-12-23 ENCOUNTER — Ambulatory Visit (INDEPENDENT_AMBULATORY_CARE_PROVIDER_SITE_OTHER): Payer: Federal, State, Local not specified - PPO

## 2020-12-23 ENCOUNTER — Other Ambulatory Visit: Payer: Self-pay

## 2020-12-23 DIAGNOSIS — E538 Deficiency of other specified B group vitamins: Secondary | ICD-10-CM

## 2020-12-23 NOTE — Progress Notes (Signed)
Pt here for monthly B12 injection per Dr. Ronnald Ramp  B12 1051mcg given IM, and pt tolerated injection well.  Next B12 injection scheduled for 01/24/21

## 2020-12-29 ENCOUNTER — Other Ambulatory Visit: Payer: Self-pay

## 2020-12-29 ENCOUNTER — Emergency Department (HOSPITAL_COMMUNITY)
Admission: EM | Admit: 2020-12-29 | Discharge: 2020-12-29 | Disposition: A | Discharge status: Home / Self Care | Payer: Federal, State, Local not specified - PPO | Attending: Emergency Medicine | Admitting: Emergency Medicine

## 2020-12-29 ENCOUNTER — Emergency Department (HOSPITAL_COMMUNITY): Payer: Federal, State, Local not specified - PPO

## 2020-12-29 DIAGNOSIS — R109 Unspecified abdominal pain: Secondary | ICD-10-CM | POA: Diagnosis not present

## 2020-12-29 DIAGNOSIS — E875 Hyperkalemia: Secondary | ICD-10-CM

## 2020-12-29 DIAGNOSIS — Z87442 Personal history of urinary calculi: Secondary | ICD-10-CM | POA: Insufficient documentation

## 2020-12-29 DIAGNOSIS — F17210 Nicotine dependence, cigarettes, uncomplicated: Secondary | ICD-10-CM | POA: Diagnosis not present

## 2020-12-29 DIAGNOSIS — N132 Hydronephrosis with renal and ureteral calculous obstruction: Secondary | ICD-10-CM | POA: Diagnosis not present

## 2020-12-29 DIAGNOSIS — N23 Unspecified renal colic: Secondary | ICD-10-CM | POA: Insufficient documentation

## 2020-12-29 LAB — URINALYSIS, ROUTINE W REFLEX MICROSCOPIC
Bilirubin Urine: NEGATIVE
Glucose, UA: NEGATIVE mg/dL
Ketones, ur: NEGATIVE mg/dL
Leukocytes,Ua: NEGATIVE
Nitrite: NEGATIVE
Protein, ur: 30 mg/dL — AB
RBC / HPF: >50 RBC/hpf — ABNORMAL HIGH (ref 0–5)
Specific Gravity, Urine: 1.013 (ref 1.005–1.030)
pH: 8 (ref 5.0–8.0)

## 2020-12-29 LAB — BASIC METABOLIC PANEL
Anion gap: 4 — ABNORMAL LOW (ref 5–15)
BUN: 9 mg/dL (ref 6–20)
CO2: 29 mmol/L (ref 22–32)
Calcium: 8.9 mg/dL (ref 8.9–10.3)
Chloride: 107 mmol/L (ref 98–111)
Creatinine, Ser: 1.11 mg/dL (ref 0.61–1.24)
GFR, Estimated: >60 mL/min (ref >60)
Glucose, Bld: 91 mg/dL (ref 70–99)
Potassium: 6 mmol/L — ABNORMAL HIGH (ref 3.5–5.1)
Sodium: 140 mmol/L (ref 135–145)

## 2020-12-29 LAB — CBC WITH DIFFERENTIAL/PLATELET
Abs Immature Granulocytes: 0.01 10*3/uL (ref 0.00–0.07)
Basophils Absolute: 0 10*3/uL (ref 0.0–0.1)
Basophils Relative: 0 %
Eosinophils Absolute: 0 10*3/uL (ref 0.0–0.5)
Eosinophils Relative: 1 %
HCT: 49.8 % (ref 39.0–52.0)
Hemoglobin: 16.6 g/dL (ref 13.0–17.0)
Immature Granulocytes: 0 %
Lymphocytes Relative: 26 %
Lymphs Abs: 1.4 10*3/uL (ref 0.7–4.0)
MCH: 31.1 pg (ref 26.0–34.0)
MCHC: 33.3 g/dL (ref 30.0–36.0)
MCV: 93.3 fL (ref 80.0–100.0)
Monocytes Absolute: 0.4 10*3/uL (ref 0.1–1.0)
Monocytes Relative: 8 %
Neutro Abs: 3.7 10*3/uL (ref 1.7–7.7)
Neutrophils Relative %: 65 %
Platelets: 159 10*3/uL (ref 150–400)
RBC: 5.34 MIL/uL (ref 4.22–5.81)
RDW: 12.3 % (ref 11.5–15.5)
WBC: 5.5 10*3/uL (ref 4.0–10.5)
nRBC: 0 % (ref 0.0–0.2)

## 2020-12-29 LAB — COMPREHENSIVE METABOLIC PANEL
ALT: 15 U/L (ref 0–44)
AST: 16 U/L (ref 15–41)
Albumin: 4.2 g/dL (ref 3.5–5.0)
Alkaline Phosphatase: 75 U/L (ref 38–126)
Anion gap: 4 — ABNORMAL LOW (ref 5–15)
BUN: 10 mg/dL (ref 6–20)
CO2: 31 mmol/L (ref 22–32)
Calcium: 9.7 mg/dL (ref 8.9–10.3)
Chloride: 105 mmol/L (ref 98–111)
Creatinine, Ser: 1.26 mg/dL — ABNORMAL HIGH (ref 0.61–1.24)
GFR, Estimated: >60 mL/min (ref >60)
Glucose, Bld: 104 mg/dL — ABNORMAL HIGH (ref 70–99)
Potassium: 6.1 mmol/L — ABNORMAL HIGH (ref 3.5–5.1)
Sodium: 140 mmol/L (ref 135–145)
Total Bilirubin: 0.8 mg/dL (ref 0.3–1.2)
Total Protein: 7.6 g/dL (ref 6.5–8.1)

## 2020-12-29 LAB — LIPASE, BLOOD: Lipase: 71 U/L — ABNORMAL HIGH (ref 11–51)

## 2020-12-29 MED ORDER — ONDANSETRON 4 MG PO TBDP: 4.0000 mg | ORAL_TABLET | Freq: Three times a day (TID) | ORAL | 0 refills | Status: DC | PRN

## 2020-12-29 MED ORDER — LOKELMA 10 G PO PACK: 10.0000 g | PACK | Freq: Every day | ORAL | 0 refills | Status: DC

## 2020-12-29 MED ORDER — SODIUM ZIRCONIUM CYCLOSILICATE 10 G PO PACK
10.0000 g | PACK | Freq: Once | ORAL | Status: AC
  Administered 2020-12-29: 10 g via ORAL
  Filled 2020-12-29: qty 1

## 2020-12-29 MED ORDER — HYDROCODONE-ACETAMINOPHEN 5-325 MG PO TABS: 2.0000 | ORAL_TABLET | ORAL | 0 refills | Status: DC | PRN

## 2020-12-29 MED ORDER — ONDANSETRON HCL 4 MG/2ML IJ SOLN
4.0000 mg | Freq: Once | INTRAMUSCULAR | Status: AC
  Administered 2020-12-29: 4 mg via INTRAVENOUS
  Filled 2020-12-29: qty 2

## 2020-12-29 MED ORDER — MORPHINE SULFATE (PF) 4 MG/ML IV SOLN
4.0000 mg | Freq: Once | INTRAVENOUS | Status: AC
  Administered 2020-12-29: 4 mg via INTRAVENOUS
  Filled 2020-12-29: qty 1

## 2020-12-29 MED ORDER — SODIUM CHLORIDE 0.9 % IV BOLUS
1000.0000 mL | Freq: Once | INTRAVENOUS | Status: AC
  Administered 2020-12-29: 1000 mL via INTRAVENOUS

## 2020-12-29 MED ORDER — KETOROLAC TROMETHAMINE 30 MG/ML IJ SOLN
15.0000 mg | Freq: Once | INTRAMUSCULAR | Status: AC
  Administered 2020-12-29: 15 mg via INTRAVENOUS
  Filled 2020-12-29: qty 1

## 2020-12-29 NOTE — Discharge Instructions (Addendum)
Return for any problem.   Your potassium today was mildly elevated at 6.0.  Your potassium needs to be rechecked within the next 24 to 48 hours.  Contact your PCP today for scheduling of recheck of potassium tomorrow.  If you cannot arrange a close outpatient recheck of your potassium you may return to the ED for same.  Take Lokelma as prescribed for treatment of your potassium.  Use Norco and Zofran for treatment of pain and nausea related to your kidney stone.  Do not use nonsteroidal medication such as Motrin or Aleve.

## 2020-12-29 NOTE — ED Provider Notes (Signed)
[facility] DEPT Provider Note   CSN: [id number] Arrival date & time: 12/29/20  2725     History Chief Complaint  Curtis Townsend presents with   Flank Pain   Hematuria    Curtis Townsend is a 45 y.o. male.  45 year old male with prior medical history as detailed below presents for evaluation per Curtis Townsend complains of right-sided flank discomfort.  Curtis Townsend reports onset of pain last night.  Curtis Townsend reports associated bloody urine.  Curtis Townsend denies fever.  Curtis Townsend denies vomiting.  The history is provided by the Curtis Townsend and medical records.  Flank Pain This is a new problem. The current episode started 6 to 12 hours ago. The problem occurs rarely. The problem has not changed since onset.Nothing aggravates the symptoms. Nothing relieves the symptoms.      Past Medical History:  Diagnosis Date   Alcoholism ([place])    Anxiety    Atypical chest pain    Bipolar 1 disorder (H[facility])    Depression    Depression    History of chicken pox    History of kidney stones    Hyperlipemia    no current med.   IBS (irritable bowel syndrome)    Lipoma of back 05/2012   right   Seasonal allergies    Wears partial dentures    upper    Curtis Townsend Active Problem List   Diagnosis Date Noted   Prediabetes 09/07/2020   Schizoaffective disorder, mixed type ([place]) 03/24/2018   Bipolar Curtis disorder, most recent episode depressed, severe without psychotic features ([place]) 03/24/2018   Pure hypercholesterolemia 03/14/2017   Pure hyperglyceridemia 03/14/2017   Family history of colon cancer in father 03/14/2017   B12 deficiency 06/28/2016   Vitamin B12 deficiency neuropathy ([place]) 06/08/2016   Routine general medical examination at a health care facility 01/27/2015   Bipolar Curtis disorder ([place]) 12/29/2013   Neurosis, posttraumatic 12/29/2013   Bipolar affective disorder, depressed ([place]) 08/04/2013    Past Surgical History:  Procedure Laterality Date   LIPOMA EXCISION  06/10/2012   Procedure:  EXCISION LIPOMA;  Surgeon: Ralene Ok, MD;  Location: [facility];  Service: General;  Laterality: Right;  excision of back lipoma on right 3x3cm       Family History  Problem Relation Age of Onset   Stroke Mother    Hypertension Mother    Diabetes Mother    Hyperlipidemia Mother    Hypertension Father    Diabetes Father    Schizophrenia Father    Hypertension Sister    Hypertension Brother    Hyperlipidemia Brother    Cancer Paternal Uncle        Prostate Cancer   Colitis Paternal Uncle    Stroke Maternal Grandmother    Heart attack Maternal Grandmother    Diabetes Paternal Grandmother    High Cholesterol Paternal Grandmother    Hypertension Brother    Hyperlipidemia Brother    Cancer Sister    High blood pressure Sister    Hyperlipidemia Sister    Esophageal cancer Neg Hx    Rectal cancer Neg Hx    Stomach cancer Neg Hx     Social History   Tobacco Use   Smoking status: Light Smoker    Packs/day: 0.10    Years: 16.00    Pack years: 1.60    Types: Cigarettes   Smokeless tobacco: Never   Tobacco comments:    1-2 cigarettes daily  Vaping Use   Vaping Use: Never used  Substance  Use Topics   Alcohol use: Not Currently    Alcohol/week: 0.0 standard drinks    Comment: Occasional use   Drug use: Not Currently    Comment: states daily THC- used last- more than week per pt    Home Medications Prior to Admission medications   Medication Sig Start Date End Date Taking? Authorizing Provider  ARIPiprazole ER (ABILIFY MAINTENA) 400 MG PRSY prefilled syringe Inject 400 mg into the muscle every 28 (twenty-eight) days. 11/10/20   Arfeen, Arlyce [place]man, MD  traZODone (DESYREL) 100 MG tablet Take 1 tablet (100 mg total) by mouth at bedtime as needed for sleep. 11/10/20   Arfeen, Arlyce Harman, MD    Allergies    Curtis Townsend has no known allergies.  Review of Systems   Review of Systems  All other systems reviewed and are negative.  Physical Exam Updated Vital  Signs BP (!) 114/53   Pulse 64   Temp 98.3 F (36.8 C)   Resp 17   Ht 6\' 1"  (1.854 m)   Wt 94.3 kg   SpO2 100%   BMI 27.44 kg/m   Physical Exam Vitals and nursing note reviewed.  Constitutional:      General: Curtis Townsend is not in acute distress.    Appearance: Normal appearance. Curtis Townsend is well-developed.  HENT:     Head: Normocephalic and atraumatic.  Eyes:     Conjunctiva/sclera: Conjunctivae normal.     Pupils: Pupils are equal, round, and reactive to light.  Cardiovascular:     Rate and Rhythm: Normal rate and regular rhythm.     Heart sounds: Normal heart sounds.  Pulmonary:     Effort: Pulmonary effort is normal. No respiratory distress.     Breath sounds: Normal breath sounds.  Abdominal:     General: There is no distension.     Palpations: Abdomen is soft.     Tenderness: There is no abdominal tenderness.  Genitourinary:    Comments: Mild right CVA tenderness Musculoskeletal:        General: No deformity. Normal range of motion.     Cervical back: Normal range of motion and neck supple.  Skin:    General: Skin is warm and dry.  Neurological:     General: No focal deficit present.     Mental Status: Curtis Townsend is alert and oriented to person, place, and time.    ED Results / Procedures / Treatments   Labs (all labs ordered are listed, but only abnormal results are displayed) Labs Reviewed  URINALYSIS, ROUTINE W REFLEX MICROSCOPIC - Abnormal; Notable for the following components:      Result Value   APPearance HAZY (*)    Hgb urine dipstick LARGE (*)    Protein, ur 30 (*)    RBC / HPF >50 (*)    Bacteria, UA RARE (*)    All other components within normal limits  COMPREHENSIVE METABOLIC PANEL - Abnormal; Notable for the following components:   Potassium 6.1 (*)    Glucose, Bld 104 (*)    Creatinine, Ser 1.26 (*)    Anion gap 4 (*)    All other components within normal limits  LIPASE, BLOOD - Abnormal; Notable for the following components:   Lipase 71 (*)    All other  components within normal limits  CBC WITH DIFFERENTIAL/PLATELET  BASIC METABOLIC PANEL    EKG None  Radiology CT ABDOMEN PELVIS WO CONTRAST  Result Date: 12/29/2020 CLINICAL DATA:  Right flank pain and hematuria. EXAM: CT ABDOMEN  AND PELVIS WITHOUT CONTRAST TECHNIQUE: Multidetector CT imaging of the abdomen and pelvis was performed following the standard protocol without IV contrast. COMPARISON:  11/03/2017 FINDINGS: Lower chest: Minimal streaky dependent subpleural atelectasis but no infiltrates or effusions. The heart is normal in size. No pericardial effusion. The distal esophagus is normal. Hepatobiliary: No hepatic lesions or intrahepatic biliary dilatation. The gallbladder is unremarkable. No common bile duct dilatation. Pancreas: No mass, inflammation or ductal dilatation. Spleen: Normal size.  No focal lesions. Adrenals/Urinary Tract: The adrenal glands are unremarkable. Small lower pole left renal calculus. No right-sided renal calculi. There is mild right hydroureteronephrosis down to an obstructing 3 mm calculus in the upper right ureter located at the L3-4 disc space level. No left-sided ureteral calculi. No bladder calculi. No worrisome renal or bladder lesions without contrast. Down to an obstructing Stomach/Bowel: The stomach, duodenum, small bowel and colon are grossly normal without oral contrast. No inflammatory changes, mass lesions or obstructive findings. The appendix is normal. Vascular/Lymphatic: The aorta is normal in caliber. No atheroscerlotic calcifications. No mesenteric of retroperitoneal mass or adenopathy. Small scattered lymph nodes are noted. Reproductive: The prostate gland and seminal vesicles are unremarkable. Other: No pelvic mass or adenopathy. No free pelvic fluid collections. No inguinal mass or adenopathy. No abdominal wall hernia or subcutaneous lesions. Musculoskeletal: No significant bony findings. IMPRESSION: 1. 3 mm upper right ureteral calculus causing mild  right-sided hydroureteronephrosis. 2. Small lower pole left renal calculus. 3. No other significant abdominal/pelvic findings, mass lesions or adenopathy. Electronically Signed   By: Marijo Sanes M.D.   On: 12/29/2020 08:17    Procedures Procedures   Medications Ordered in ED Medications  sodium chloride 0.9 % bolus 1,000 mL (1,000 mLs Intravenous New Bag/Given 12/29/20 0731)  morphine 4 MG/ML injection 4 mg (4 mg Intravenous Given 12/29/20 0728)  ketorolac (TORADOL) 30 MG/ML injection 15 mg (15 mg Intravenous Given 12/29/20 0728)  ondansetron (ZOFRAN) injection 4 mg (4 mg Intravenous Given 12/29/20 5631)    ED Course  Curtis have reviewed the triage vital signs and the nursing notes.  Pertinent labs & imaging results that were available during my care of the Curtis Townsend were reviewed by me and considered in my medical decision making (see chart for details).    MDM Rules/Calculators/A&P                          MDM  MSE complete  ISSAIC Townsend was evaluated in Emergency Department on 12/29/2020 for the symptoms described in the history of present illness. Curtis Townsend was evaluated in the context of the global COVID-19 pandemic, which necessitated consideration that the Curtis Townsend might be at risk for infection with the SARS-CoV-2 virus that causes COVID-19. Institutional protocols and algorithms that pertain to the evaluation of patients at risk for COVID-19 are in a state of rapid change based on information released by regulatory bodies including the CDC and federal and state organizations. These policies and algorithms were followed during the Curtis Townsend's care in the ED.  Curtis Townsend presented with complaint of right-sided flank discomfort.  Exam and history consistent with likely renal colic.  CT imaging demonstrates presence of ureteral stone.  Curtis Townsend is improved after IV fluids and pain medication.  Of note, Curtis Townsend with mildly elevated potassium on labs.  Repeat potassium check demonstrates potassium  of 6.0.  Curtis Townsend denies significant use of nonsteroidals in the last several days.  Case discussed with Dr. Joelyn Oms covering nephrology.  Agrees with plan  for use of Lokelma in the outpatient setting.  Agrees with plan for recheck of potassium within the last 24 to 48 hours with the Curtis Townsend's PCP.  Curtis Townsend is advised to avoid use of nonsteroidals.  Curtis Townsend will be prescribed pain medication and nausea medication for treatment.   Final Clinical Impression(s) / ED Diagnoses Final diagnoses:  Renal colic on right side  Hyperkalemia    Rx / DC Orders ED Discharge Orders          Ordered    sodium zirconium cyclosilicate (LOKELMA) 10 g PACK packet  Daily       Note to Pharmacy: Take 10 Grams PO Daily for 5 days   12/29/20 1137    HYDROcodone-acetaminophen (NORCO/VICODIN) 5-325 MG tablet  Every 4 hours PRN        12/29/20 1137    ondansetron (ZOFRAN ODT) 4 MG disintegrating tablet  Every 8 hours PRN        12/29/20 1137             Valarie Merino, MD 12/29/20 1138

## 2020-12-29 NOTE — ED Triage Notes (Signed)
Pt c/o right flank pain and hematuria this morning. Pt c/o nausea and emesis x1 this morning. Pt denies fever. Pt a/ox4, ambulatory.  Pt hx of kidney stones.

## 2020-12-29 NOTE — ED Notes (Signed)
Pt given urinal.

## 2020-12-30 ENCOUNTER — Telehealth: Payer: Self-pay | Admitting: Internal Medicine

## 2020-12-30 ENCOUNTER — Encounter: Payer: Self-pay | Admitting: Internal Medicine

## 2020-12-30 ENCOUNTER — Ambulatory Visit (INDEPENDENT_AMBULATORY_CARE_PROVIDER_SITE_OTHER): Payer: Federal, State, Local not specified - PPO | Admitting: Internal Medicine

## 2020-12-30 VITALS — BP 122/64 | HR 87 | Temp 99.1°F | Ht 73.0 in | Wt 208.0 lb

## 2020-12-30 DIAGNOSIS — E538 Deficiency of other specified B group vitamins: Secondary | ICD-10-CM | POA: Diagnosis not present

## 2020-12-30 DIAGNOSIS — N200 Calculus of kidney: Secondary | ICD-10-CM

## 2020-12-30 DIAGNOSIS — E875 Hyperkalemia: Secondary | ICD-10-CM | POA: Diagnosis not present

## 2020-12-30 LAB — BASIC METABOLIC PANEL
BUN: 11 mg/dL (ref 6–23)
CO2: 33 mEq/L — ABNORMAL HIGH (ref 19–32)
Calcium: 9.4 mg/dL (ref 8.4–10.5)
Chloride: 106 mEq/L (ref 96–112)
Creatinine, Ser: 1.22 mg/dL (ref 0.40–1.50)
GFR: 71.84 mL/min (ref >60.00)
Glucose, Bld: 104 mg/dL — ABNORMAL HIGH (ref 70–99)
Potassium: 3.8 mEq/L (ref 3.5–5.1)
Sodium: 142 mEq/L (ref 135–145)

## 2020-12-30 LAB — URINALYSIS, ROUTINE W REFLEX MICROSCOPIC
Bilirubin Urine: NEGATIVE
Ketones, ur: NEGATIVE
Leukocytes,Ua: NEGATIVE
Nitrite: NEGATIVE
Specific Gravity, Urine: 1.015 (ref 1.000–1.030)
Total Protein, Urine: NEGATIVE
Urine Glucose: NEGATIVE
Urobilinogen, UA: 1 (ref 0.0–1.0)
pH: 6.5 (ref 5.0–8.0)

## 2020-12-30 LAB — CORTISOL: Cortisol, Plasma: 7.9 ug/dL

## 2020-12-30 LAB — FOLATE: Folate: 4.3 ng/mL — ABNORMAL LOW (ref >5.9)

## 2020-12-30 MED ORDER — FOLIC ACID 1 MG PO TABS: 1.0000 mg | ORAL_TABLET | Freq: Every day | ORAL | 1 refills | Status: DC

## 2020-12-30 NOTE — Telephone Encounter (Signed)
   Patient seen in ED 6/22 Advised to  recheck potassium within the last 24 to 48 hours  Should patient be double booked or lab order only?  Please advise

## 2020-12-30 NOTE — Progress Notes (Signed)
[facility]   Subjective:  Patient ID: Curtis Townsend, male    DOB: 03/22/1976  Age: 45 y.o. MRN: [id number]  CC: Follow-up (ED f/u for kidney stones)  This visit occurred during the SARS-CoV-2 public health emergency.  Safety protocols were in place, including screening questions prior to the visit, additional usage of staff PPE, and extensive cleaning of exam room while observing appropriate contact time as indicated for disinfecting solutions.    HPI [name] presents for f/up -  He was recently seen in the ED and was found to have a kidney stone.  He was also treated for hyperkalemia.  He denies flank pain, dysuria, hematuria, nausea, vomiting, fever, or chills.  Outpatient Medications Prior to Visit  Medication Sig Dispense Refill   ARIPiprazole ER (ABILIFY MAINTENA) 400 MG PRSY prefilled syringe Inject 400 mg into the muscle every 28 (twenty-eight) days. 1 each 2   traZODone (DESYREL) 100 MG tablet Take 1 tablet (100 mg total) by mouth at bedtime as needed for sleep. 30 tablet 2   HYDROcodone-acetaminophen (NORCO/VICODIN) 5-325 MG tablet Take 2 tablets by mouth every 4 (four) hours as needed. 10 tablet 0   ondansetron (ZOFRAN ODT) 4 MG disintegrating tablet Take 1 tablet (4 mg total) by mouth every 8 (eight) hours as needed for nausea or vomiting. 20 tablet 0   sodium zirconium cyclosilicate (LOKELMA) 10 g PACK packet Take 10 g by mouth daily. Dispense QS for 5 days (Patient not taking: Reported on 12/30/2020) 5 packet 0   Facility-Administered Medications Prior to Visit  Medication Dose Route Frequency Provider Last Rate Last Admin   cyanocobalamin ((VITAMIN B-12)) injection 1,000 mcg  1,000 mcg Intramuscular Q30 days Janith Lima, MD   1,000 mcg at 12/23/20 0947    ROS Review of Systems  Constitutional:  Negative for chills, fatigue and fever.  HENT: Negative.    Eyes: Negative.   Respiratory:  Negative for cough, chest tightness, shortness of breath and wheezing.    Cardiovascular:  Negative for chest pain, palpitations and leg swelling.  Gastrointestinal:  Negative for abdominal pain, diarrhea and nausea.  Endocrine: Negative.   Genitourinary:  Negative for difficulty urinating, dysuria, flank pain, hematuria, scrotal swelling and testicular pain.  Musculoskeletal: Negative.   Skin: Negative.  Negative for color change and rash.  Allergic/Immunologic: Negative.   Neurological: Negative.  Negative for dizziness.  Hematological:  Negative for adenopathy. Does not bruise/bleed easily.   Objective:  BP 122/64 (BP Location: Right Arm, Patient Position: Sitting, Cuff Size: Large)   Pulse 87   Temp 99.1 F (37.3 C) (Oral)   Ht 6\' 1"  (1.854 m)   Wt 208 lb (94.3 kg)   SpO2 97%   BMI 27.44 kg/m   BP Readings from Last 3 Encounters:  12/30/20 122/64  12/29/20 125/76  09/07/20 104/70    Wt Readings from Last 3 Encounters:  12/30/20 208 lb (94.3 kg)  12/29/20 208 lb (94.3 kg)  09/07/20 207 lb (93.9 kg)    Physical Exam Vitals reviewed.  Constitutional:      Appearance: Normal appearance.  HENT:     Nose: Nose normal.     Mouth/Throat:     Mouth: Mucous membranes are moist.  Eyes:     General: No scleral icterus.    Conjunctiva/sclera: Conjunctivae normal.  Cardiovascular:     Rate and Rhythm: Normal rate.     Heart sounds: No murmur heard. Pulmonary:     Effort: Pulmonary effort is normal.  Breath sounds: No stridor. No wheezing, rhonchi or rales.  Abdominal:     General: Abdomen is flat. Bowel sounds are normal. There is no distension.     Palpations: Abdomen is soft. There is no hepatomegaly, splenomegaly or mass.  Musculoskeletal:        General: Normal range of motion.     Cervical back: Neck supple.     Right lower leg: No edema.     Left lower leg: No edema.  Lymphadenopathy:     Cervical: No cervical adenopathy.  Skin:    General: Skin is warm and dry.  Neurological:     General: No focal deficit present.      Mental Status: He is alert.    Lab Results  Component Value Date   WBC 5.5 12/29/2020   HGB 16.6 12/29/2020   HCT 49.8 12/29/2020   PLT 159 12/29/2020   GLUCOSE 104 (H) 12/30/2020   CHOL 231 (H) 09/07/2020   TRIG 98.0 09/07/2020   HDL 49.00 09/07/2020   LDLCALC 162 (H) 09/07/2020   ALT 15 12/29/2020   AST 16 12/29/2020   NA 142 12/30/2020   K 3.8 12/30/2020   CL 106 12/30/2020   CREATININE 1.22 12/30/2020   BUN 11 12/30/2020   CO2 33 (H) 12/30/2020   TSH 0.78 09/07/2020   PSA 1.88 09/07/2020   HGBA1C 5.5 09/17/2020    CT ABDOMEN PELVIS WO CONTRAST  Result Date: 12/29/2020 CLINICAL DATA:  Right flank pain and hematuria. EXAM: CT ABDOMEN AND PELVIS WITHOUT CONTRAST TECHNIQUE: Multidetector CT imaging of the abdomen and pelvis was performed following the standard protocol without IV contrast. COMPARISON:  11/03/2017 FINDINGS: Lower chest: Minimal streaky dependent subpleural atelectasis but no infiltrates or effusions. The heart is normal in size. No pericardial effusion. The distal esophagus is normal. Hepatobiliary: No hepatic lesions or intrahepatic biliary dilatation. The gallbladder is unremarkable. No common bile duct dilatation. Pancreas: No mass, inflammation or ductal dilatation. Spleen: Normal size.  No focal lesions. Adrenals/Urinary Tract: The adrenal glands are unremarkable. Small lower pole left renal calculus. No right-sided renal calculi. There is mild right hydroureteronephrosis down to an obstructing 3 mm calculus in the upper right ureter located at the L3-4 disc space level. No left-sided ureteral calculi. No bladder calculi. No worrisome renal or bladder lesions without contrast. Down to an obstructing Stomach/Bowel: The stomach, duodenum, small bowel and colon are grossly normal without oral contrast. No inflammatory changes, mass lesions or obstructive findings. The appendix is normal. Vascular/Lymphatic: The aorta is normal in caliber. No atheroscerlotic  calcifications. No mesenteric of retroperitoneal mass or adenopathy. Small scattered lymph nodes are noted. Reproductive: The prostate gland and seminal vesicles are unremarkable. Other: No pelvic mass or adenopathy. No free pelvic fluid collections. No inguinal mass or adenopathy. No abdominal wall hernia or subcutaneous lesions. Musculoskeletal: No significant bony findings. IMPRESSION: 1. 3 mm upper right ureteral calculus causing mild right-sided hydroureteronephrosis. 2. Small lower pole left renal calculus. 3. No other significant abdominal/pelvic findings, mass lesions or adenopathy. Electronically Signed   By: Marijo Sanes M.D.   On: 12/29/2020 08:17    Assessment & Plan:   Marilyn was seen today for follow-up.  Diagnoses and all orders for this visit:  Kidney stone on right side- He has trace, persistent blood in the urine but the pain has resolved and there are no other complications. -     Basic metabolic panel; Future -     Urinalysis, Routine w reflex microscopic; Future -  Urinalysis, Routine w reflex microscopic -     Basic metabolic panel  Acute hyperkalemia- His potassium level is normal now.  Will screen for secondary causes of hyperkalemia. -     Basic metabolic panel; Future -     Cancel: Hepatic function panel; Future -     Aldosterone + renin activity w/ ratio; Future -     Cortisol; Future -     Cortisol -     Aldosterone + renin activity w/ ratio -     Basic metabolic panel  W54 deficiency- He will continue parenteral B12 replacement therapy. -     Folate; Future -     Folate  Dietary folate deficiency -     folic acid (FOLVITE) 1 MG tablet; Take 1 tablet (1 mg total) by mouth daily.  I have discontinued Ustin [facility] [facility]'s HYDROcodone-acetaminophen and ondansetron. I am also having him start on folic acid. Additionally, I am having him maintain his traZODone, Abilify Maintena, and Lokelma. We will continue to administer cyanocobalamin.  Meds ordered this  encounter  Medications   folic acid (FOLVITE) 1 MG tablet    Sig: Take 1 tablet (1 mg total) by mouth daily.    Dispense:  90 tablet    Refill:  1      Follow-up: Return in about 4 weeks (around 01/27/2021).  Scarlette Calico, MD

## 2021-01-04 ENCOUNTER — Encounter (HOSPITAL_COMMUNITY): Payer: Self-pay

## 2021-01-04 ENCOUNTER — Other Ambulatory Visit: Payer: Self-pay

## 2021-01-04 ENCOUNTER — Ambulatory Visit (HOSPITAL_COMMUNITY): Payer: Federal, State, Local not specified - PPO

## 2021-01-04 VITALS — BP 117/73 | HR 73 | Temp 98.3°F | Ht 73.0 in | Wt 207.2 lb

## 2021-01-04 DIAGNOSIS — H401331 Pigmentary glaucoma, bilateral, mild stage: Secondary | ICD-10-CM | POA: Diagnosis not present

## 2021-01-04 DIAGNOSIS — F250 Schizoaffective disorder, bipolar type: Secondary | ICD-10-CM

## 2021-01-04 DIAGNOSIS — F319 Bipolar disorder, unspecified: Secondary | ICD-10-CM

## 2021-01-05 ENCOUNTER — Other Ambulatory Visit: Payer: Self-pay

## 2021-01-05 ENCOUNTER — Telehealth (HOSPITAL_COMMUNITY): Payer: Self-pay

## 2021-01-05 ENCOUNTER — Ambulatory Visit (HOSPITAL_COMMUNITY): Payer: Federal, State, Local not specified - PPO | Admitting: Licensed Clinical Social Worker

## 2021-01-05 NOTE — Telephone Encounter (Signed)
Patient came in on 01/04/21 for his injection. However, epic would not let me go to plan or wrapup to document the injection.  Patient presented for his Abilify Maintena 400mg  injection. Patient was cooperative and pleasant. More talkative than usual. Injection was prepare and given in his RIGHT deltoid. Patient to return in 4 weeks for next injection

## 2021-01-06 LAB — ALDOSTERONE + RENIN ACTIVITY W/ RATIO
ALDO / PRA Ratio: 3.4 Ratio (ref 0.9–28.9)
Aldosterone: 3 ng/dL
Renin Activity: 0.87 ng/mL/h (ref 0.25–5.82)

## 2021-01-19 ENCOUNTER — Other Ambulatory Visit: Payer: Self-pay

## 2021-01-19 ENCOUNTER — Encounter (HOSPITAL_COMMUNITY): Payer: Self-pay | Admitting: Licensed Clinical Social Worker

## 2021-01-19 ENCOUNTER — Ambulatory Visit (INDEPENDENT_AMBULATORY_CARE_PROVIDER_SITE_OTHER): Payer: Federal, State, Local not specified - PPO | Admitting: Licensed Clinical Social Worker

## 2021-01-19 DIAGNOSIS — F250 Schizoaffective disorder, bipolar type: Secondary | ICD-10-CM

## 2021-01-19 NOTE — Progress Notes (Signed)
Virtual Visit via Telephone Note  I connected with Curtis Townsend on 01/19/21 at 12:30 PM EDT by telephone and verified that I am speaking with the correct person using two identifiers.  Location: Patient: home Provider: home office   I discussed the limitations, risks, security and privacy concerns of performing an evaluation and management service by telephone and the availability of in person appointments. I also discussed with the patient that there may be a patient responsible charge related to this service. The patient expressed understanding and agreed to proceed.  Type of Therapy: Individual Therapy   Treatment Goals addressed: "to make sure my mood is stable and to help me cope with having to be in my house all the time". Curtis Townsend will demonstrate and report increased mood stabilization and coping skills 5 out of 7 days".   Interventions: CBT   Summary: Curtis Townsend is a 45 y.o. male who presents with Schizoaffective Disorder, mixed type   Suicidal/Homicidal: No without intent/plan   Therapist Response:   Curtis Townsend met with clinician for individual therapy. Curtis Townsend discussed his psychiatric symptoms and current life events. Curtis Townsend reports his mental health has been mainly positive. He reported no severe anxiety, depression, or anger over the past several weeks. However, he shared that his physical health has been a problem. Curtis Townsend shared updates about a kidney stone that he is in process of passing, which disrupted his sleep last night. He shared that pain is currently controlled by medication. However, he reported that he was very tired today. Clinician completed check in and encouraged Curtis Townsend to reach out if anything new arises.    Plan: Return again in 2 weeks.   Diagnosis: Schizoaffective Disorder, mixed type      I discussed the assessment and treatment plan with the patient. The patient was provided an opportunity to ask questions and all were answered. The patient  agreed with the plan and demonstrated an understanding of the instructions.   The patient was advised to call back or seek an in-person evaluation if the symptoms worsen or if the condition fails to improve as anticipated.  I provided 16 minutes of non-face-to-face time during this encounter.   [name], LCSW

## 2021-01-24 ENCOUNTER — Ambulatory Visit (INDEPENDENT_AMBULATORY_CARE_PROVIDER_SITE_OTHER): Payer: Federal, State, Local not specified - PPO

## 2021-01-24 ENCOUNTER — Other Ambulatory Visit: Payer: Self-pay

## 2021-01-24 DIAGNOSIS — E538 Deficiency of other specified B group vitamins: Secondary | ICD-10-CM | POA: Diagnosis not present

## 2021-01-24 NOTE — Progress Notes (Signed)
Pt here for monthly B12 injection per Dr Ronnald Ramp.  B12 1067mcg given IM in left deltoid and pt tolerated injection well.  Next B12 injection scheduled for 02/24/21.

## 2021-02-03 ENCOUNTER — Telehealth (INDEPENDENT_AMBULATORY_CARE_PROVIDER_SITE_OTHER): Payer: Federal, State, Local not specified - PPO | Admitting: Psychiatry

## 2021-02-03 ENCOUNTER — Other Ambulatory Visit: Payer: Self-pay

## 2021-02-03 ENCOUNTER — Ambulatory Visit (INDEPENDENT_AMBULATORY_CARE_PROVIDER_SITE_OTHER): Payer: Federal, State, Local not specified - PPO

## 2021-02-03 ENCOUNTER — Encounter (HOSPITAL_COMMUNITY): Payer: Self-pay | Admitting: Psychiatry

## 2021-02-03 ENCOUNTER — Encounter (HOSPITAL_COMMUNITY): Payer: Self-pay

## 2021-02-03 DIAGNOSIS — F4310 Post-traumatic stress disorder, unspecified: Secondary | ICD-10-CM

## 2021-02-03 DIAGNOSIS — F319 Bipolar disorder, unspecified: Secondary | ICD-10-CM

## 2021-02-03 MED ORDER — ABILIFY MAINTENA 400 MG IM PRSY: 400.0000 mg | PREFILLED_SYRINGE | INTRAMUSCULAR | 2 refills | Status: DC

## 2021-02-03 MED ORDER — ARIPIPRAZOLE ER 400 MG IM PRSY
400.0000 mg | PREFILLED_SYRINGE | Freq: Once | INTRAMUSCULAR | Status: AC
  Administered 2021-02-03: 400 mg via INTRAMUSCULAR

## 2021-02-03 MED ORDER — TRAZODONE HCL 100 MG PO TABS: 100.0000 mg | ORAL_TABLET | Freq: Every evening | ORAL | 2 refills | Status: DC | PRN

## 2021-02-03 NOTE — Patient Instructions (Addendum)
Patient presented today for his Abilify Maintena '400mg'$  injection. Patient was cooperative upon approach and pleasant. He stated that he's doing ok and has no issues/problems with his medications and that nothing has changed with anything else. Injection was prepared and given in his LEFT deltoid. Patient to return in 4 weeks for next due injection.

## 2021-02-03 NOTE — Progress Notes (Signed)
Virtual Visit via Telephone Note  I connected with Curtis Townsend on 02/03/21 at 10:00 AM EDT by telephone and verified that I am speaking with the correct person using two identifiers.  Location: Patient: Home Provider: Home Office   I discussed the limitations, risks, security and privacy concerns of performing an evaluation and management service by telephone and the availability of in person appointments. I also discussed with the patient that there may be a patient responsible charge related to this service. The patient expressed understanding and agreed to proceed.   History of Present Illness: Patient is evaluated by phone session.  He is taking trazodone which is helping his sleep and nightmares.  Patient was seen in the emergency room because of kidney stones.  He is feeling better.  Denies any highs and lows, irritability, anger, mood swings or any paranoia.  He is getting Abilify injection and he is scheduled to have today at 11 AM.  He is in therapy with [name] which is also going well.  He is getting along with family member and he does not want to change the medication.  He has no tremors, shakes or any EPS.  His appetite is okay and his weight is stable.  Denies drinking or using any illegal substances.   Past Psychiatric History:  H/O abuse, anger, paranoia, ADHD and poor impulse control. H/O suicidal attempt as tried to hang himself and sister saved him.  H/O multiple inpatient treatment and last inpatient in September 2019. H/O THC use. Took Prozac, Risperdal, Risperdal, Depakote, hydroxyzine and Depakote but discontinued due to noncompliance.    Recent Results (from the past 2160 hour(s))  Comprehensive metabolic panel     Status: Abnormal   Collection Time: 12/29/20  7:00 AM  Result Value Ref Range   Sodium 140 135 - 145 mmol/L   Potassium 6.1 (H) 3.5 - 5.1 mmol/L   Chloride 105 98 - 111 mmol/L   CO2 31 22 - 32 mmol/L   Glucose, Bld 104 (H) 70 - 99 mg/dL    Comment:  Glucose reference range applies only to samples taken after fasting for at least 8 hours.   BUN 10 6 - 20 mg/dL   Creatinine, Ser 1.26 (H) 0.61 - 1.24 mg/dL   Calcium 9.7 8.9 - 10.3 mg/dL   Total Protein 7.6 6.5 - 8.1 g/dL   Albumin 4.2 3.5 - 5.0 g/dL   AST 16 15 - 41 U/L   ALT 15 0 - 44 U/L   Alkaline Phosphatase 75 38 - 126 U/L   Total Bilirubin 0.8 0.3 - 1.2 mg/dL   GFR, Estimated >60 >60 mL/min    Comment: (NOTE) Calculated using the CKD-EPI Creatinine Equation (2021)    Anion gap 4 (L) 5 - 15    Comment: Performed at [facility], [place] [address], [place], [place] [address]  Lipase, blood     Status: Abnormal   Collection Time: 12/29/20  7:00 AM  Result Value Ref Range   Lipase 71 (H) 11 - 51 U/L    Comment: Performed at [facility], [place] [address], [place], [place] [address]  CBC with Differential     Status: None   Collection Time: 12/29/20  7:00 AM  Result Value Ref Range   WBC 5.5 4.0 - 10.5 K/uL   RBC 5.34 4.22 - 5.81 MIL/uL   Hemoglobin 16.6 13.0 - 17.0 g/dL   HCT 49.8 39.0 - 52.0 %   MCV 93.3 80.0 -  100.0 fL   MCH 31.1 26.0 - 34.0 pg   MCHC 33.3 30.0 - 36.0 g/dL   RDW 12.3 11.5 - 15.5 %   Platelets 159 150 - 400 K/uL   nRBC 0.0 0.0 - 0.2 %   Neutrophils Relative % 65 %   Neutro Abs 3.7 1.7 - 7.7 K/uL   Lymphocytes Relative 26 %   Lymphs Abs 1.4 0.7 - 4.0 K/uL   Monocytes Relative 8 %   Monocytes Absolute 0.4 0.1 - 1.0 K/uL   Eosinophils Relative 1 %   Eosinophils Absolute 0.0 0.0 - 0.5 K/uL   Basophils Relative 0 %   Basophils Absolute 0.0 0.0 - 0.1 K/uL   Immature Granulocytes 0 %   Abs Immature Granulocytes 0.01 0.00 - 0.07 K/uL    Comment: Performed at [facility], [place] [address], [place], [place] [address]  Urinalysis, Routine w reflex microscopic Urine, Clean Catch     Status: Abnormal   Collection Time: 12/29/20  9:11 AM  Result Value Ref Range   Color, Urine YELLOW YELLOW   APPearance HAZY  (A) CLEAR   Specific Gravity, Urine 1.013 1.005 - 1.030   pH 8.0 5.0 - 8.0   Glucose, UA NEGATIVE NEGATIVE mg/dL   Hgb urine dipstick LARGE (A) NEGATIVE   Bilirubin Urine NEGATIVE NEGATIVE   Ketones, ur NEGATIVE NEGATIVE mg/dL   Protein, ur 30 (A) NEGATIVE mg/dL   Nitrite NEGATIVE NEGATIVE   Leukocytes,Ua NEGATIVE NEGATIVE   RBC / HPF >50 (H) 0 - 5 RBC/hpf   WBC, UA 11-20 0 - 5 WBC/hpf   Bacteria, UA RARE (A) NONE SEEN   Mucus PRESENT     Comment: Performed at [facility], [place] [address] [place], [place] [id number]  Basic metabolic panel     Status: Abnormal   Collection Time: 12/29/20 10:19 AM  Result Value Ref Range   Sodium 140 135 - 145 mmol/L   Potassium 6.0 (H) 3.5 - 5.1 mmol/L   Chloride 107 98 - 111 mmol/L   CO2 29 22 - 32 mmol/L   Glucose, Bld 91 70 - 99 mg/dL    Comment: Glucose reference range applies only to samples taken after fasting for at least 8 hours.   BUN 9 6 - 20 mg/dL   Creatinine, Ser 1.11 0.61 - 1.24 mg/dL   Calcium 8.9 8.9 - 10.3 mg/dL   GFR, Estimated >60 >60 mL/min    Comment: (NOTE) Calculated using the CKD-EPI Creatinine Equation (2021)    Anion gap 4 (L) 5 - 15    Comment: Performed at [facility], [place] [address], [place], [place] [address]  Folate     Status: Abnormal   Collection Time: 12/30/20 11:43 AM  Result Value Ref Range   Folate 4.3 (L) >5.9 ng/mL  Urinalysis, Routine w reflex microscopic     Status: Abnormal   Collection Time: 12/30/20 11:43 AM  Result Value Ref Range   Color, Urine YELLOW Yellow;Lt. Yellow;Straw;Dark Yellow;Amber;Green;Red;Brown   APPearance CLEAR Clear;Turbid;Slightly Cloudy;Cloudy   Specific Gravity, Urine 1.015 1.000 - 1.030   pH 6.5 5.0 - 8.0   Total Protein, Urine NEGATIVE Negative   Urine Glucose NEGATIVE Negative   Ketones, ur NEGATIVE Negative   Bilirubin Urine NEGATIVE Negative   Hgb urine dipstick SMALL (A) Negative   Urobilinogen, UA 1.0 0.0 - 1.0    Leukocytes,Ua NEGATIVE Negative   Nitrite NEGATIVE Negative   WBC, UA 0-2/hpf 0-2/hpf   RBC /  HPF 3-6/hpf (A) 0-2/hpf   Mucus, UA Presence of (A) None   Squamous Epithelial / LPF Rare(0-4/hpf) Rare(0-4/hpf)  Cortisol     Status: None   Collection Time: 12/30/20 11:43 AM  Result Value Ref Range   Cortisol, Plasma 7.9 ug/dL    Comment: AM:  4.3 - 22.4 ug/dLPM:  3.1 - 16.7 ug/dL  Aldosterone + renin activity w/ ratio     Status: None   Collection Time: 12/30/20 11:43 AM  Result Value Ref Range   Aldosterone 3  ng/dL    Comment: . Unable to flag abnormal result(s), please refer     to reference range(s) below: . Adult Reference Ranges for Aldosterone, LC/MS/MS:     Upright  8:00 - 10:00 am    < or = 28 ng/dL     Upright  4:00 -  6:00 pm    < or = 21 ng/dL     Supine   8:00 - 10:00 am       3 - 16 ng/dL .    Renin Activity 0.87 0.25 - 5.82 ng/mL/h   ALDO / PRA Ratio 3.4 0.9 - 28.9 Ratio    Comment: . This test was developed and its analytical performance characteristics have been determined by [place], [place]. It has not been cleared or approved by the U.S. Food and Drug Administration. This assay has been validated pursuant to the CLIA regulations and is used for clinical purposes. .   Basic metabolic panel     Status: Abnormal   Collection Time: 12/30/20 11:43 AM  Result Value Ref Range   Sodium 142 135 - 145 mEq/L   Potassium 3.8 3.5 - 5.1 mEq/L   Chloride 106 96 - 112 mEq/L   CO2 33 (H) 19 - 32 mEq/L   Glucose, Bld 104 (H) 70 - 99 mg/dL   BUN 11 6 - 23 mg/dL   Creatinine, Ser 1.22 0.40 - 1.50 mg/dL   GFR 71.84 >60.00 mL/min    Comment: Calculated using the CKD-EPI Creatinine Equation (2021)   Calcium 9.4 8.4 - 10.5 mg/dL    Psychiatric Specialty Exam: Physical Exam  Review of Systems  Weight 207 lb (93.9 kg).There is no height or weight on file to calculate BMI.  General Appearance: NA  Eye Contact:  NA  Speech:  Slow  Volume:   Normal  Mood:  Euthymic  Affect:  NA  Thought Process:  Goal Directed  Orientation:  Full (Time, Place, and Person)  Thought Content:  WDL  Suicidal Thoughts:  No  Homicidal Thoughts:  No  Memory:  Immediate;   Good Recent;   Good Remote;   Good  Judgement:  Intact  Insight:  Present  Psychomotor Activity:  NA  Concentration:  Concentration: Good and Attention Span: Good  Recall:  Good  Fund of Knowledge:  Good  Language:  Good  Akathisia:  No  Handed:  Right  AIMS (if indicated):     Assets:  Communication Skills Desire for Improvement Housing Resilience Transportation  ADL's:  Intact  Cognition:  WNL  Sleep:   ok      Assessment and Plan: PTSD.  Bipolar disorder type I.  Patient is stable on his current medication.  I reviewed blood work results.  Patient does not want to change the medication.  Continue Abilify intramuscular injection 400 mg every 28 days and trazodone 100 mg at bedtime.  Encouraged to keep appointment with [name] recommended to call us back  if there is any passion or any concern.  Follow-up in 3 months.  Follow Up Instructions:    I discussed the assessment and treatment plan with the patient. The patient was provided an opportunity to ask questions and all were answered. The patient agreed with the plan and demonstrated an understanding of the instructions.   The patient was advised to call back or seek an in-person evaluation if the symptoms worsen or if the condition fails to improve as anticipated.  I provided 16 minutes of non-face-to-face time during this encounter.   [name], MD

## 2021-02-09 ENCOUNTER — Other Ambulatory Visit: Payer: Self-pay

## 2021-02-09 ENCOUNTER — Ambulatory Visit (HOSPITAL_COMMUNITY): Payer: Federal, State, Local not specified - PPO | Admitting: Licensed Clinical Social Worker

## 2021-02-09 ENCOUNTER — Ambulatory Visit (INDEPENDENT_AMBULATORY_CARE_PROVIDER_SITE_OTHER): Payer: Federal, State, Local not specified - PPO | Admitting: Internal Medicine

## 2021-02-09 ENCOUNTER — Encounter: Payer: Self-pay | Admitting: Internal Medicine

## 2021-02-09 DIAGNOSIS — N200 Calculus of kidney: Secondary | ICD-10-CM | POA: Diagnosis not present

## 2021-02-09 DIAGNOSIS — E559 Vitamin D deficiency, unspecified: Secondary | ICD-10-CM | POA: Diagnosis not present

## 2021-02-09 LAB — COMPREHENSIVE METABOLIC PANEL
ALT: 14 U/L (ref 0–53)
AST: 15 U/L (ref 0–37)
Albumin: 4.1 g/dL (ref 3.5–5.2)
Alkaline Phosphatase: 81 U/L (ref 39–117)
BUN: 10 mg/dL (ref 6–23)
CO2: 30 mEq/L (ref 19–32)
Calcium: 9.6 mg/dL (ref 8.4–10.5)
Chloride: 106 mEq/L (ref 96–112)
Creatinine, Ser: 1.2 mg/dL (ref 0.40–1.50)
GFR: 73.22 mL/min (ref >60.00)
Glucose, Bld: 80 mg/dL (ref 70–99)
Potassium: 4.1 mEq/L (ref 3.5–5.1)
Sodium: 142 mEq/L (ref 135–145)
Total Bilirubin: 0.5 mg/dL (ref 0.2–1.2)
Total Protein: 7.3 g/dL (ref 6.0–8.3)

## 2021-02-09 LAB — VITAMIN D 25 HYDROXY (VIT D DEFICIENCY, FRACTURES): VITD: 9.84 ng/mL — ABNORMAL LOW (ref 30.00–100.00)

## 2021-02-09 LAB — PHOSPHORUS: Phosphorus: 2.3 mg/dL (ref 2.3–4.6)

## 2021-02-09 NOTE — Progress Notes (Signed)
Name: Curtis Townsend  MRN/ DOB: [id number], [birth year]    Age/ Sex: 45 y.o., male    PCP: Janith Lima, MD   Reason for Endocrinology Evaluation: Hypercalcemia     Date of Initial Endocrinology Evaluation: 02/09/2021     HPI: Mr. Curtis Townsend is a 45 y.o. male with a past medical history of kidney stones. The patient presented for initial endocrinology clinic visit on 02/09/2021 for consultative assistance with his hypercalcemia.   Mr. Tortorella indicates that he was first diagnosed with hypercalcemia in 09/2020, with a non-corrected serum calcium of 10.3 mg/dL ( Corrected 9.98)    Since that time, he  experienced symptoms of  frequency but no polydipsia, denies constipation. He denies  use of over the counter calcium (including supplements, Tums, Rolaids, or other calcium containing antacids), lithium, HCTZ, or vitamin D supplements.   He has a history of kidney stones on CT scan 12/2020, he also has hx of renal stones ~ 10 yrs ago, was seen by urology then , but no kidney disease, liver disease, granulomatous disease.   He denies  osteoporosis or prior fractures. Daily dietary calcium intake: 1 servings  a week.  He denies  family history of osteoporosis, parathyroid disease, thyroid disease.   Sister with thyroid disease  Denies FH kidney stones      HISTORY:  Past Medical History:  Past Medical History:  Diagnosis Date   Alcoholism ([place])    Anxiety    Atypical chest pain    Bipolar 1 disorder ([place])    Depression    Depression    History of chicken pox    History of kidney stones    Hyperlipemia    no current med.   IBS (irritable bowel syndrome)    Lipoma of back 05/2012   right   Seasonal allergies    Wears partial dentures    upper   Past Surgical History:  Past Surgical History:  Procedure Laterality Date   LIPOMA EXCISION  06/10/2012   Procedure: EXCISION LIPOMA;  Surgeon: Ralene Ok, MD;  Location: [facility];  Service: General;   Laterality: Right;  excision of back lipoma on right 3x3cm    Social History:  reports that he has been smoking cigarettes. He has a 1.60 pack-year smoking history. He has never used smokeless tobacco. He reports previous alcohol use. He reports previous drug use. Family History: family history includes Cancer in his paternal uncle and sister; Colitis in his paternal uncle; Diabetes in his father, mother, and paternal grandmother; Heart attack in his maternal grandmother; High Cholesterol in his paternal grandmother; High blood pressure in his sister; Hyperlipidemia in his brother, brother, mother, and sister; Hypertension in his brother, brother, father, mother, and sister; Schizophrenia in his father; Stroke in his maternal grandmother and mother.   HOME MEDICATIONS: Allergies as of 02/09/2021   No Known Allergies      Medication List        Accurate as of February 09, 2021  1:01 PM. If you have any questions, ask your nurse or doctor.          Abilify Maintena 400 MG Prsy prefilled syringe Generic drug: ARIPiprazole ER Inject 400 mg into the muscle every 28 (twenty-eight) days.   folic acid 1 MG tablet Commonly known as: FOLVITE Take 1 tablet (1 mg total) by mouth daily.   Lokelma 10 g Pack packet Generic drug: sodium zirconium cyclosilicate Take 10 g by mouth daily. Dispense  QS for 5 days   traZODone 100 MG tablet Commonly known as: DESYREL Take 1 tablet (100 mg total) by mouth at bedtime as needed for sleep.          REVIEW OF SYSTEMS: A comprehensive ROS was conducted with the patient and is negative except as per HPI     OBJECTIVE:  VS: BP 112/78   Pulse 91   Ht '6\' 1"'$  (1.854 m)   Wt 204 lb 9.6 oz (92.8 kg)   SpO2 99%   BMI 26.99 kg/m    Wt Readings from Last 3 Encounters:  12/30/20 208 lb (94.3 kg)  12/29/20 208 lb (94.3 kg)  09/07/20 207 lb (93.9 kg)     EXAM: General: Pt appears well and is in NAD  Neck: General: Supple without  adenopathy. Thyroid: Thyroid size normal.  No goiter or nodules appreciated. No thyroid bruit.  Lungs: Clear with good BS bilat with no rales, rhonchi, or wheezes  Heart: Auscultation: RRR.  Abdomen: Normoactive bowel sounds, soft, nontender, without masses or organomegaly palpable  Extremities:  BL LE: No pretibial edema normal ROM and strength.  Skin: Hair: Texture and amount normal with gender appropriate distribution Skin Inspection: No rashes Skin Palpation: Skin temperature, texture, and thickness normal to palpation  Neuro:  Motor: Normal strength throughout DTRs: 2+ and symmetric in UE without delay in relaxation phase  Mental Status: Judgment, insight: Intact Orientation: Oriented to time, place, and person Mood and affect: No depression, anxiety, or agitation     DATA REVIEWED: Results for COTTRELL, LHUILLIER (MRN [id number]) as of 02/10/2021 13:24  Ref. Range 02/09/2021 14:49  Sodium Latest Ref Range: 135 - 145 mEq/L 142  Potassium Latest Ref Range: 3.5 - 5.1 mEq/L 4.1  Chloride Latest Ref Range: 96 - 112 mEq/L 106  CO2 Latest Ref Range: 19 - 32 mEq/L 30  Glucose Latest Ref Range: 70 - 99 mg/dL 80  BUN Latest Ref Range: 6 - 23 mg/dL 10  Creatinine Latest Ref Range: 0.40 - 1.50 mg/dL 1.20  Calcium Latest Ref Range: 8.4 - 10.5 mg/dL 9.6  Phosphorus Latest Ref Range: 2.3 - 4.6 mg/dL 2.3  Alkaline Phosphatase Latest Ref Range: 39 - 117 U/L 81  Albumin Latest Ref Range: 3.5 - 5.2 g/dL 4.1  AST Latest Ref Range: 0 - 37 U/L 15  ALT Latest Ref Range: 0 - 53 U/L 14  Total Protein Latest Ref Range: 6.0 - 8.3 g/dL 7.3  Total Bilirubin Latest Ref Range: 0.2 - 1.2 mg/dL 0.5  GFR Latest Ref Range: >60.00 mL/min 73.22  VITD Latest Ref Range: 30.00 - 100.00 ng/mL 9.84 (L)  PTH, Intact Latest Ref Range: 16 - 77 pg/mL 42    Results for KADEYN, TSUNG (MRN [id number]) as of 02/10/2021 13:24  Ref. Range 12/30/2020 11:43  ALDOSTERONE Latest Ref Range:  ng/dL 3  ALDO / PRA Ratio Latest Ref  Range: 0.9 - 28.9 Ratio 3.4  Cortisol, Plasma Latest Units: ug/dL 7.9    ASSESSMENT/PLAN/RECOMMENDATIONS:   Hypercalcemia:   -I suspect this was pseudo calcium Mia as in review of his records he had only 1 incident of serum calcium that was elevated at 10.3 mg/DL, but when corrected to an albumin level of 4.4 this trends down to normal at 9.98 mg/DL.  That being said patients with primary hyper para do tend to have fluctuating levels of calcium, and we will  monitor in 3 months -Repeat serum calcium today is normal at 9.6 mg/DL -I am  going to check his PTH as well as ionized calcium -Phosphorus has come back normal -We will proceed with 24-hour urine collection for calcium excretion -Patient advised to stay hydrated and consume 2-3 servings of dietary calcium a day     2.  Vitamin D deficiency:  -Patient will be advised to start OTC vitamin D3 at 2000 IU daily.  I will not prescribe ergocalciferol due to increased risk of hypercalcemia with such high doses   3.  Nephrolithiasis:   -I explained to the patient this is beyond the scope of endocrinology and will need to follow-up with urology for further management of kidney stones.  Follow-up in 3 months    Signed electronically by: Mack Guise, MD  [facility] Endocrinology  [facility] [address] [place], [place] [address] Phone: [phone] FAX: [phone]   CC: Janith Lima, MD [place] [address] Phone: [phone] Fax: [phone]   Return to Endocrinology clinic as below: Future Appointments  Date Time Provider [place]  02/09/2021  2:20 PM Laporscha Linehan, Melanie Crazier, MD [facility] None  02/24/2021  9:20 AM [facility] NURSE [facility] None  03/03/2021 11:00 AM [facility] NURSE [facility] None  05/06/2021 10:00 AM Arfeen, Arlyce Harman, MD [facility] None

## 2021-02-09 NOTE — Patient Instructions (Signed)
-   Stay Hydrated  - Consume 2-3 servings of calcium in your diet ( low fat dairy, green leafy vegetables)    24-Hour Urine Collection  You will be collecting your urine for a 24-hour period of time. Your timer starts with your first urine of the morning (For example - If you first pee at [place], your timer will start at [place]) [place] away your first urine of the morning Collect your urine every time you pee for the next 24 hours STOP your urine collection 24 hours after you started the collection (For example - You would stop at 9AM the day after you started)

## 2021-02-10 LAB — CALCIUM, IONIZED: Calcium, Ion: 5.2 mg/dL (ref 4.8–5.6)

## 2021-02-10 LAB — PARATHYROID HORMONE, INTACT (NO CA): PTH: 42 pg/mL (ref 16–77)

## 2021-02-23 ENCOUNTER — Ambulatory Visit (INDEPENDENT_AMBULATORY_CARE_PROVIDER_SITE_OTHER): Payer: Federal, State, Local not specified - PPO | Admitting: Licensed Clinical Social Worker

## 2021-02-23 ENCOUNTER — Other Ambulatory Visit: Payer: Self-pay

## 2021-02-23 DIAGNOSIS — F250 Schizoaffective disorder, bipolar type: Secondary | ICD-10-CM | POA: Diagnosis not present

## 2021-02-24 ENCOUNTER — Encounter (HOSPITAL_COMMUNITY): Payer: Self-pay | Admitting: Licensed Clinical Social Worker

## 2021-02-24 ENCOUNTER — Other Ambulatory Visit: Payer: Self-pay

## 2021-02-24 ENCOUNTER — Ambulatory Visit (INDEPENDENT_AMBULATORY_CARE_PROVIDER_SITE_OTHER): Payer: Federal, State, Local not specified - PPO

## 2021-02-24 DIAGNOSIS — E538 Deficiency of other specified B group vitamins: Secondary | ICD-10-CM | POA: Diagnosis not present

## 2021-02-24 DIAGNOSIS — G63 Polyneuropathy in diseases classified elsewhere: Secondary | ICD-10-CM

## 2021-02-24 MED ORDER — CYANOCOBALAMIN 1000 MCG/ML IJ SOLN
1000.0000 ug | Freq: Once | INTRAMUSCULAR | Status: AC
  Administered 2021-02-24: 1000 ug via INTRAMUSCULAR

## 2021-02-24 NOTE — Progress Notes (Signed)
Pt given B12 w/o any complications. °

## 2021-02-24 NOTE — Progress Notes (Signed)
Virtual Visit via Telephone Note  I connected with Curtis Townsend on 02/24/21 at 11:00 AM EDT by telephone and verified that I am speaking with the correct person using two identifiers.  Location: Patient: home Provider: home office   I discussed the limitations, risks, security and privacy concerns of performing an evaluation and management service by telephone and the availability of in person appointments. I also discussed with the patient that there may be a patient responsible charge related to this service. The patient expressed understanding and agreed to proceed.  Type of Therapy: Individual Therapy   Treatment Goals addressed: "to make sure my mood is stable and to help me cope with having to be in my house all the time". Curtis Townsend will demonstrate and report increased mood stabilization and coping skills 5 out of 7 days".   Interventions: CBT   Summary: Curtis Townsend is a 45 y.o. male who presents with Schizoaffective Disorder, mixed type   Suicidal/Homicidal: No without intent/plan   Therapist Response:   Curtis Townsend met with clinician for individual therapy. Curtis Townsend discussed his psychiatric symptoms and current life events. Curtis Townsend reports things are going fine. Clinician utilized CBT to process thoughts, feelings, behaviors, and triggers to any stress or concerns. Curtis Townsend processed some concerns about youngest son going to school. Clinician spent time processing the worries and explored any ways that Curtis Townsend can communicate with the teacher about son's needs. Clinician encouraged Curtis Townsend to maintain good communication with the teacher and to make sure a conference is scheduled in the beginning of the semester. Clinician touched base on medication and identified that it is working and things seem to be stable.    Plan: Return again in 2 weeks.   Diagnosis: Schizoaffective Disorder, mixed type      I discussed the assessment and treatment plan with the patient. The patient was  provided an opportunity to ask questions and all were answered. The patient agreed with the plan and demonstrated an understanding of the instructions.   The patient was advised to call back or seek an in-person evaluation if the symptoms worsen or if the condition fails to improve as anticipated.  I provided 40 minutes of non-face-to-face time during this encounter.   [name], LCSW

## 2021-03-03 ENCOUNTER — Ambulatory Visit (INDEPENDENT_AMBULATORY_CARE_PROVIDER_SITE_OTHER): Payer: Federal, State, Local not specified - PPO

## 2021-03-03 ENCOUNTER — Encounter (HOSPITAL_COMMUNITY): Payer: Self-pay

## 2021-03-03 ENCOUNTER — Other Ambulatory Visit: Payer: Self-pay

## 2021-03-03 DIAGNOSIS — F319 Bipolar disorder, unspecified: Secondary | ICD-10-CM | POA: Diagnosis not present

## 2021-03-03 MED ORDER — ARIPIPRAZOLE ER 400 MG IM PRSY
400.0000 mg | PREFILLED_SYRINGE | Freq: Once | INTRAMUSCULAR | Status: AC
  Administered 2021-03-03: 400 mg via INTRAMUSCULAR

## 2021-03-03 NOTE — Patient Instructions (Addendum)
Patient presented today for his due Abilify Maintena '400mg'$  injection. Patient pleasant  and cooperative upon approach. Injection prepared as ordered and given in his RIGHT deltoid. Patient to return in 4 weeks for his next injection

## 2021-03-09 ENCOUNTER — Other Ambulatory Visit: Payer: Self-pay

## 2021-03-09 ENCOUNTER — Ambulatory Visit (INDEPENDENT_AMBULATORY_CARE_PROVIDER_SITE_OTHER): Payer: Federal, State, Local not specified - PPO | Admitting: Licensed Clinical Social Worker

## 2021-03-09 DIAGNOSIS — F250 Schizoaffective disorder, bipolar type: Secondary | ICD-10-CM | POA: Diagnosis not present

## 2021-03-10 ENCOUNTER — Encounter (HOSPITAL_COMMUNITY): Payer: Self-pay | Admitting: Licensed Clinical Social Worker

## 2021-03-10 ENCOUNTER — Ambulatory Visit (HOSPITAL_COMMUNITY): Payer: Federal, State, Local not specified - PPO

## 2021-03-10 NOTE — Progress Notes (Signed)
Virtual Visit via Telephone Note  I connected with Helyn Numbers on 03/10/21 at 11:00 AM EDT by telephone and verified that I am speaking with the correct person using two identifiers.  Location: Patient: home Provider: home office   I discussed the limitations, risks, security and privacy concerns of performing an evaluation and management service by telephone and the availability of in person appointments. I also discussed with the patient that there may be a patient responsible charge related to this service. The patient expressed understanding and agreed to proceed.  Type of Therapy: Individual Therapy   Treatment Goals addressed: "to make sure my mood is stable and to help me cope with having to be in my house all the time". Darreld will demonstrate and report increased mood stabilization and coping skills 5 out of 7 days".   Interventions: CBT   Summary: Finnean Cerami is a 44 y.o. male who presents with Schizoaffective Disorder, mixed type   Suicidal/Homicidal: No without intent/plan   Therapist Response:   Boy met with clinician for individual therapy. Alvan discussed his psychiatric symptoms and current life events. [place] reports things are going fine. Clinician explored udaptes with the children starting school this week and getting back into the routine. Paulanthony identified some concern about his son, who started Kindergarten, but has been dx with Autism Spectrum Disorder. Clinician processed concerns using CBT. Clinician noted the importance of communicating with the teacher and making sure IEP has been reviewed. Clinician also discussed ways that Adreyan can allow his son to try his best, encourage him, and trust that the teacher and school admins will do what is required. Clinician reflected the worry, but also identified the importance of transitioning the worry to wonder and possibly excitement for the new experiences. Clinician received updates on the rest of the  family. Clinician encouraged Asriel to get back into his walking routine to get out of the house and to have some movement for his stress management.    Plan: Return again in 2 weeks.   Diagnosis: Schizoaffective Disorder, mixed type      I discussed the assessment and treatment plan with the patient. The patient was provided an opportunity to ask questions and all were answered. The patient agreed with the plan and demonstrated an understanding of the instructions.   The patient was advised to call back or seek an in-person evaluation if the symptoms worsen or if the condition fails to improve as anticipated.  I provided 40 minutes of non-face-to-face time during this encounter.   [name], LCSW

## 2021-03-29 DIAGNOSIS — H401131 Primary open-angle glaucoma, bilateral, mild stage: Secondary | ICD-10-CM | POA: Diagnosis not present

## 2021-03-30 ENCOUNTER — Other Ambulatory Visit: Payer: Self-pay

## 2021-03-30 ENCOUNTER — Ambulatory Visit (HOSPITAL_COMMUNITY): Payer: Federal, State, Local not specified - PPO | Admitting: Licensed Clinical Social Worker

## 2021-03-30 ENCOUNTER — Ambulatory Visit: Payer: Federal, State, Local not specified - PPO

## 2021-04-01 ENCOUNTER — Ambulatory Visit (INDEPENDENT_AMBULATORY_CARE_PROVIDER_SITE_OTHER): Payer: Federal, State, Local not specified - PPO

## 2021-04-01 ENCOUNTER — Other Ambulatory Visit: Payer: Self-pay

## 2021-04-01 DIAGNOSIS — E538 Deficiency of other specified B group vitamins: Secondary | ICD-10-CM | POA: Diagnosis not present

## 2021-04-01 DIAGNOSIS — G63 Polyneuropathy in diseases classified elsewhere: Secondary | ICD-10-CM | POA: Diagnosis not present

## 2021-04-01 MED ORDER — CYANOCOBALAMIN 1000 MCG/ML IJ SOLN
1000.0000 ug | Freq: Once | INTRAMUSCULAR | Status: AC
  Administered 2021-04-01: 1000 ug via INTRAMUSCULAR

## 2021-04-01 NOTE — Progress Notes (Signed)
Pt was given monthly B12 injection w/o any complications.

## 2021-04-07 ENCOUNTER — Ambulatory Visit (HOSPITAL_COMMUNITY): Payer: Federal, State, Local not specified - PPO | Admitting: *Deleted

## 2021-04-07 ENCOUNTER — Other Ambulatory Visit: Payer: Self-pay

## 2021-04-07 ENCOUNTER — Encounter (HOSPITAL_COMMUNITY): Payer: Self-pay | Admitting: *Deleted

## 2021-04-07 VITALS — BP 114/67 | HR 73 | Temp 98.3°F | Resp 18 | Ht 73.0 in | Wt 205.2 lb

## 2021-04-07 DIAGNOSIS — F319 Bipolar disorder, unspecified: Secondary | ICD-10-CM

## 2021-04-07 MED ORDER — ARIPIPRAZOLE ER 400 MG IM PRSY
400.0000 mg | PREFILLED_SYRINGE | Freq: Once | INTRAMUSCULAR | Status: AC
  Administered 2021-05-05: 400 mg via INTRAMUSCULAR

## 2021-04-07 NOTE — Patient Instructions (Signed)
Pt in clinic today for due Abilify Maintena 400 mg injection. Pt cooperative on approach. Eye contact minimal. Injection prepared as ordered and given in left deltoid without complaint. Pt denies any issues with mood since last injection. Pt to return in approximately 4 weeks for next due injection.

## 2021-04-13 ENCOUNTER — Ambulatory Visit (HOSPITAL_COMMUNITY): Payer: Federal, State, Local not specified - PPO | Admitting: Licensed Clinical Social Worker

## 2021-04-14 ENCOUNTER — Other Ambulatory Visit: Payer: Self-pay

## 2021-04-14 ENCOUNTER — Ambulatory Visit (HOSPITAL_COMMUNITY): Payer: Federal, State, Local not specified - PPO | Admitting: Licensed Clinical Social Worker

## 2021-04-27 ENCOUNTER — Other Ambulatory Visit: Payer: Self-pay

## 2021-04-27 ENCOUNTER — Ambulatory Visit (HOSPITAL_COMMUNITY): Payer: Federal, State, Local not specified - PPO | Admitting: Licensed Clinical Social Worker

## 2021-04-29 ENCOUNTER — Ambulatory Visit: Payer: Federal, State, Local not specified - PPO

## 2021-05-05 ENCOUNTER — Ambulatory Visit (HOSPITAL_BASED_OUTPATIENT_CLINIC_OR_DEPARTMENT_OTHER): Payer: Federal, State, Local not specified - PPO | Admitting: *Deleted

## 2021-05-05 ENCOUNTER — Other Ambulatory Visit: Payer: Self-pay

## 2021-05-05 ENCOUNTER — Ambulatory Visit (INDEPENDENT_AMBULATORY_CARE_PROVIDER_SITE_OTHER): Payer: Federal, State, Local not specified - PPO

## 2021-05-05 ENCOUNTER — Encounter (HOSPITAL_COMMUNITY): Payer: Self-pay | Admitting: *Deleted

## 2021-05-05 VITALS — BP 123/78 | HR 89 | Temp 98.1°F | Resp 18 | Ht 73.0 in | Wt 208.0 lb

## 2021-05-05 DIAGNOSIS — E538 Deficiency of other specified B group vitamins: Secondary | ICD-10-CM

## 2021-05-05 DIAGNOSIS — F319 Bipolar disorder, unspecified: Secondary | ICD-10-CM | POA: Diagnosis not present

## 2021-05-05 DIAGNOSIS — G63 Polyneuropathy in diseases classified elsewhere: Secondary | ICD-10-CM | POA: Diagnosis not present

## 2021-05-05 MED ORDER — CYANOCOBALAMIN 1000 MCG/ML IJ SOLN
1000.0000 ug | Freq: Once | INTRAMUSCULAR | Status: AC
  Administered 2021-05-05: 1000 ug via INTRAMUSCULAR

## 2021-05-05 NOTE — Patient Instructions (Signed)
Pt in clinic today for due Abilify Maintena 400 mg injection. Pt flat with minimal eye contact. Cooperative on approach although a bit guarded. Injection prepared as ordered and given in right deltoid without complaint. Pt to return in approximately 28 days for next due injection.

## 2021-05-05 NOTE — Progress Notes (Signed)
Pt given B12 w/o any complications. °

## 2021-05-06 ENCOUNTER — Encounter (HOSPITAL_COMMUNITY): Payer: Self-pay | Admitting: Psychiatry

## 2021-05-06 ENCOUNTER — Telehealth (HOSPITAL_BASED_OUTPATIENT_CLINIC_OR_DEPARTMENT_OTHER): Payer: Federal, State, Local not specified - PPO | Admitting: Psychiatry

## 2021-05-06 VITALS — Wt 208.0 lb

## 2021-05-06 DIAGNOSIS — F319 Bipolar disorder, unspecified: Secondary | ICD-10-CM | POA: Diagnosis not present

## 2021-05-06 DIAGNOSIS — F4310 Post-traumatic stress disorder, unspecified: Secondary | ICD-10-CM | POA: Diagnosis not present

## 2021-05-06 MED ORDER — ABILIFY MAINTENA 400 MG IM PRSY: 400.0000 mg | PREFILLED_SYRINGE | INTRAMUSCULAR | 2 refills | Status: DC

## 2021-05-06 NOTE — Progress Notes (Signed)
Virtual Visit via Telephone Note  I connected with Curtis Townsend on 05/06/21 at 10:00 AM EDT by telephone and verified that I am speaking with the correct person using two identifiers.  Location: Patient: Home Provider: Home Office   I discussed the limitations, risks, security and privacy concerns of performing an evaluation and management service by telephone and the availability of in person appointments. I also discussed with the patient that there may be a patient responsible charge related to this service. The patient expressed understanding and agreed to proceed.   History of Present Illness: Patient is evaluated by phone session.  He had not filled the trazodone for past few weeks and he feels he does not need it because he is sleeping good.  He denies any nightmares are flashback.  He like to keep the current Abilify injection.  He is getting Abilify injection once a month and he notices mood, irritability, anger and paranoia is under control.  He is getting along with family members.  He is also in therapy with [name].  He has no tremors, shakes or any EPS.  He denies any mania or any suicidal thoughts.  Recently he had a blood work.  His basic chemistry is normal.  His vitamin D is low.  His appetite is okay and his weight is stable.    Past Psychiatric History:  H/O abuse, anger, paranoia, ADHD and poor impulse control. H/O suicidal attempt as tried to hang himself and sister saved him.  H/O multiple inpatient treatment and last inpatient in September 2019. H/O THC use. Took Prozac, Risperdal, Risperdal, Depakote, hydroxyzine and Depakote but discontinued due to noncompliance.    Psychiatric Specialty Exam: Physical Exam  Review of Systems  Weight 208 lb (94.3 kg).Body mass index is 27.44 kg/m.  General Appearance: NA  Eye Contact:  NA  Speech:  Slow  Volume:  Decreased  Mood:  Euthymic  Affect:  NA  Thought Process:  Descriptions of Associations: Intact  Orientation:   Full (Time, Place, and Person)  Thought Content:  WDL  Suicidal Thoughts:  No  Homicidal Thoughts:  No  Memory:  Immediate;   Good Recent;   Good Remote;   Fair  Judgement:  Intact  Insight:  Present  Psychomotor Activity:  NA  Concentration:  Concentration: Fair and Attention Span: Fair  Recall:  Good  Fund of Knowledge:  Good  Language:  Good  Akathisia:  No  Handed:  Right  AIMS (if indicated):     Assets:  Communication Skills Desire for Improvement Housing Resilience Social Support  ADL's:  Intact  Cognition:  WNL  Sleep:   ok      Assessment and Plan: PTSD.  Bipolar disorder type I.  I reviewed previous notes, current medication and recent blood work results.  Patient is not taking trazodone for past few weeks as he did not refill his prescription but does not feel he needed as sleeping good.  Continue Abilify intramuscular 400 mg every 28 days.  Recommended to call us back if he needed the trazodone or nightmares started to come back.  Continue therapy with [name].  Recommended to call us back if there is any question or any concern.  Follow-up in 3 months.  Follow Up Instructions:    I discussed the assessment and treatment plan with the patient. The patient was provided an opportunity to ask questions and all were answered. The patient agreed with the plan and demonstrated an understanding of the instructions.  The patient was advised to call back or seek an in-person evaluation if the symptoms worsen or if the condition fails to improve as anticipated.  I provided 17 minutes of non-face-to-face time during this encounter.   [name], MD

## 2021-05-11 ENCOUNTER — Other Ambulatory Visit: Payer: Self-pay

## 2021-05-11 ENCOUNTER — Ambulatory Visit (INDEPENDENT_AMBULATORY_CARE_PROVIDER_SITE_OTHER): Payer: Federal, State, Local not specified - PPO | Admitting: Licensed Clinical Social Worker

## 2021-05-11 DIAGNOSIS — F250 Schizoaffective disorder, bipolar type: Secondary | ICD-10-CM

## 2021-05-16 ENCOUNTER — Encounter (HOSPITAL_COMMUNITY): Payer: Self-pay | Admitting: Licensed Clinical Social Worker

## 2021-05-16 ENCOUNTER — Ambulatory Visit: Payer: Federal, State, Local not specified - PPO | Admitting: Internal Medicine

## 2021-05-16 NOTE — Progress Notes (Deleted)
Name: Curtis Townsend  MRN/ DOB: [id number], 1976/02/13    Age/ Sex: 45 y.o., male     PCP: Janith Lima, MD   Reason for Endocrinology Evaluation: Hypercalcemia      Initial Endocrinology Clinic Visit: 02/09/2021    PATIENT IDENTIFIER: Curtis Townsend is a 45 y.o., male with a past medical history of nephrolithiasis. He has followed with [facility] since 02/09/2021 for consultative assistance with management of his Hypercalcemia .   HISTORICAL SUMMARY:  Curtis Townsend indicates that he was first diagnosed with hypercalcemia in 09/2020, with a non-corrected serum calcium of 10.3 mg/dL ( Corrected 9.98) and a normal PTH.    He has a history of kidney stones on CT scan 12/2020, he also has hx of renal stones ~ 10 yrs ago  Repeat labs at our office showed a serum calcium of 9.6 mg/dL ( non corrected) , ionized calcium also normal at 5.2 ( reference.8-5.6) with normal PTH at 42 pg/mL.   He denies  osteoporosis or prior fractures Sister with thyroid disease  Denies FH kidney stones  SUBJECTIVE:   Today (05/16/2021):  Curtis Townsend is here for hx of hypercalcemia .   Pt follows with Psych for bipolar disorder    HISTORY:  Past Medical History:  Past Medical History:  Diagnosis Date   Alcoholism ([place])    Anxiety    Atypical chest pain    Bipolar 1 disorder ([place])    Depression    Depression    History of chicken pox    History of kidney stones    Hyperlipemia    no current med.   IBS (irritable bowel syndrome)    Lipoma of back 05/2012   right   Seasonal allergies    Wears partial dentures    upper   Past Surgical History:  Past Surgical History:  Procedure Laterality Date   LIPOMA EXCISION  06/10/2012   Procedure: EXCISION LIPOMA;  Surgeon: Ralene Ok, MD;  Location: [facility];  Service: General;  Laterality: Right;  excision of back lipoma on right 3x3cm   Social History:  reports that he has been smoking cigarettes. He has a  1.60 pack-year smoking history. He has never used smokeless tobacco. He reports that he does not currently use alcohol. He reports that he does not currently use drugs. Family History:  Family History  Problem Relation Age of Onset   Stroke Mother    Hypertension Mother    Diabetes Mother    Hyperlipidemia Mother    Hypertension Father    Diabetes Father    Schizophrenia Father    Hypertension Sister    Hypertension Brother    Hyperlipidemia Brother    Cancer Paternal Uncle        Prostate Cancer   Colitis Paternal Uncle    Stroke Maternal Grandmother    Heart attack Maternal Grandmother    Diabetes Paternal Grandmother    High Cholesterol Paternal Grandmother    Hypertension Brother    Hyperlipidemia Brother    Cancer Sister    High blood pressure Sister    Hyperlipidemia Sister    Esophageal cancer Neg Hx    Rectal cancer Neg Hx    Stomach cancer Neg Hx      HOME MEDICATIONS: Allergies as of 05/16/2021   No Known Allergies      Medication List        Accurate as of May 16, 2021  7:29 AM. If you  have any questions, ask your nurse or doctor.          Abilify Maintena 400 MG Prsy prefilled syringe Generic drug: ARIPiprazole ER Inject 400 mg into the muscle every 28 (twenty-eight) days.   folic acid 1 MG tablet Commonly known as: FOLVITE Take 1 tablet (1 mg total) by mouth daily.   Lokelma 10 g Pack packet Generic drug: sodium zirconium cyclosilicate Take 10 g by mouth daily. Dispense QS for 5 days   traZODone 100 MG tablet Commonly known as: DESYREL Take 1 tablet (100 mg total) by mouth at bedtime as needed for sleep.          OBJECTIVE:   PHYSICAL EXAM: VS: There were no vitals taken for this visit.   EXAM: General: Pt appears well and is in NAD  Hydration: Well-hydrated with moist mucous membranes and good skin turgor  Eyes: External eye exam normal without stare, lid lag or exophthalmos.  EOM intact.  PERRL.  Ears, Nose, Throat:  Hearing: Grossly intact bilaterally Dental: Good dentition  Throat: Clear without mass, erythema or exudate  Neck: General: Supple without adenopathy. Thyroid: Thyroid size normal.  No goiter or nodules appreciated. No thyroid bruit.  Lungs: Clear with good BS bilat with no rales, rhonchi, or wheezes  Heart: Auscultation: RRR.  Abdomen: Normoactive bowel sounds, soft, nontender, without masses or organomegaly palpable  Extremities: Gait and station: Normal gait  Digits and nails: No clubbing, cyanosis, petechiae, or nodes Head and neck: Normal alignment and mobility BL UE: Normal ROM and strength. BL LE: No pretibial edema normal ROM and strength.  Skin: Hair: Texture and amount normal with gender appropriate distribution Skin Inspection: No rashes, acanthosis nigricans/skin tags. No lipohypertrophy Skin Palpation: Skin temperature, texture, and thickness normal to palpation  Neuro: Cranial nerves: II - XII grossly intact  Cerebellar: Normal coordination and movement; no tremor Motor: Normal strength throughout DTRs: 2+ and symmetric in UE without delay in relaxation phase  Mental Status: Judgment, insight: Intact Orientation: Oriented to time, place, and person Memory: Intact for recent and remote events Mood and affect: No depression, anxiety, or agitation     DATA REVIEWED: ***    ASSESSMENT / PLAN / RECOMMENDATIONS:   Hypercalcemia:  I suspect this was pseudo hypercalcemia.  as in review of his records he had only 1 incident of serum calcium that was elevated at 10.3 mg/DL, but when corrected to an albumin level of 4.4 this trends down to normal at 9.98 mg/DL.  That being said patients with primary hyper para do tend to have fluctuating levels of calcium, and we will  monitor in 3 months -Repeat serum calcium today is normal at 9.6 mg/DL -I am going to check his PTH as well as ionized calcium -Phosphorus has come back normal -We will proceed with 24-hour urine collection  for calcium excretion -Patient advised to stay hydrated and consume 2-3 servings of dietary calcium a day       .  Vitamin D deficiency:   -Patient will be advised to start OTC vitamin D3 at 2000 IU daily.  I will not prescribe ergocalciferol due to increased risk of hypercalcemia with such high doses    Abby Nena Jordan, MD  [facility]  [facility] [address] [place], [place] [address] Phone: [phone] FAX: [phone]      CC: Janith Lima, MD [place] [address] Phone: [phone]  Fax: [phone]   Return to Endocrinology  clinic as below: Future Appointments  Date Time Provider [place]  05/16/2021 10:50 AM Latrecia Capito, Melanie Crazier, MD [facility] None  05/24/2021 12:30 PM Berenice Bouton, Jobe Marker, LCSW [facility] None  06/07/2021 11:00 AM [facility] NURSE [facility] None  06/07/2021 12:30 PM Schlosberg, Jobe Marker, LCSW [facility] None  06/08/2021 10:40 AM [facility] NURSE [facility] None  08/03/2021 10:00 AM Arfeen, Arlyce Harman, MD [facility] None

## 2021-05-16 NOTE — Progress Notes (Signed)
Virtual Visit via Telephone Note  I connected with Curtis Townsend on 05/16/21 at 11:00 AM EDT by telephone and verified that I am speaking with the correct person using two identifiers.  Location: Patient: home Provider: home office   I discussed the limitations, risks, security and privacy concerns of performing an evaluation and management service by telephone and the availability of in person appointments. I also discussed with the patient that there may be a patient responsible charge related to this service. The patient expressed understanding and agreed to proceed.   Type of Therapy: Individual Therapy   Treatment Goals addressed: "to make sure my mood is stable and to help me cope with having to be in my house all the time". Curtis Townsend will demonstrate and report increased mood stabilization and coping skills 5 out of 7 days".   Interventions: CBT   Summary: Curtis Townsend is a 45 y.o. male who presents with Schizoaffective Disorder, mixed type   Suicidal/Homicidal: No without intent/plan   Therapist Response:   Curtis Townsend met with clinician for individual therapy. Curtis Townsend discussed his psychiatric symptoms and current life events. Curtis Townsend reports he is doing well. He shared no major or significant updates in session. Clinician processed medication management services and his concern that Dr was getting frustrated. Clinician utilized CBT to identify thoughts, feelings, and behaviors. Clinician validated the challenge to get Curtis Townsend to talk sometimes when things are fine. Clinician noted the importance and positivity of things being good and under control. Clinician noted that starting Abilify Maintena has been really good for keeping Curtis Townsend calm under pressure. Clinician also noted that stability is the goal.  Clinician explored coping skills and noted that Curtis Townsend continues to walk daily and he has been working with his family to ensure that things are as calm and relaxing at home as  they can be.   Plan: Return again in 2 weeks.   Diagnosis: Schizoaffective Disorder, mixed type    I discussed the assessment and treatment plan with the patient. The patient was provided an opportunity to ask questions and all were answered. The patient agreed with the plan and demonstrated an understanding of the instructions.   The patient was advised to call back or seek an in-person evaluation if the symptoms worsen or if the condition fails to improve as anticipated.  I provided 40 minutes of non-face-to-face time during this encounter.   Curtis Curling, LCSW

## 2021-05-24 ENCOUNTER — Other Ambulatory Visit: Payer: Self-pay

## 2021-05-24 ENCOUNTER — Ambulatory Visit (INDEPENDENT_AMBULATORY_CARE_PROVIDER_SITE_OTHER): Payer: Federal, State, Local not specified - PPO | Admitting: Licensed Clinical Social Worker

## 2021-05-24 DIAGNOSIS — F250 Schizoaffective disorder, bipolar type: Secondary | ICD-10-CM | POA: Diagnosis not present

## 2021-05-25 ENCOUNTER — Encounter (HOSPITAL_COMMUNITY): Payer: Self-pay | Admitting: Licensed Clinical Social Worker

## 2021-05-25 NOTE — Progress Notes (Signed)
Virtual Visit via Telephone Note  I connected with Curtis Townsend on 05/25/21 at 12:30 PM EST by telephone and verified that I am speaking with the correct person using two identifiers.  Location: Patient: home Provider: office   I discussed the limitations, risks, security and privacy concerns of performing an evaluation and management service by telephone and the availability of in person appointments. I also discussed with the patient that there may be a patient responsible charge related to this service. The patient expressed understanding and agreed to proceed.  Type of Therapy: Individual Therapy   Treatment Goals addressed: "to make sure my mood is stable and to help me cope with having to be in my house all the time". Saquan will demonstrate and report increased mood stabilization and coping skills 5 out of 7 days".   Interventions: CBT   Summary: Buzz Gutierrez is a 45 y.o. male who presents with Schizoaffective Disorder, mixed type   Suicidal/Homicidal: No without intent/plan   Therapist Response:   Jerelle met with clinician for individual therapy. Elza discussed his psychiatric symptoms and current life events. Filemon reports he is doing well. He shared that he has been doing well from a mental health standpoint. He reported that there was not much going on, but that he and his family have been sick this week. Clinician explored sxs and noted that it was likely a similar cold that others have been reporting in the area. Clinician discussed interactions at home with family. Clinician also identified that mood has been stable due to medication working well. Clinician reviewed coping skills, noting that when parents are not feeling well, they may have less tolerance for children's behaviors.    Plan: Return again in 2 weeks.   Diagnosis: Schizoaffective Disorder, mixed type   I discussed the assessment and treatment plan with the patient. The patient was provided an  opportunity to ask questions and all were answered. The patient agreed with the plan and demonstrated an understanding of the instructions.   The patient was advised to call back or seek an in-person evaluation if the symptoms worsen or if the condition fails to improve as anticipated.  I provided 25 minutes of non-face-to-face time during this encounter.    R , LCSW  

## 2021-06-07 ENCOUNTER — Ambulatory Visit (HOSPITAL_BASED_OUTPATIENT_CLINIC_OR_DEPARTMENT_OTHER): Payer: Federal, State, Local not specified - PPO | Admitting: *Deleted

## 2021-06-07 ENCOUNTER — Encounter (HOSPITAL_COMMUNITY): Payer: Self-pay | Admitting: *Deleted

## 2021-06-07 ENCOUNTER — Ambulatory Visit (HOSPITAL_COMMUNITY): Payer: Federal, State, Local not specified - PPO | Admitting: Licensed Clinical Social Worker

## 2021-06-07 ENCOUNTER — Other Ambulatory Visit: Payer: Self-pay

## 2021-06-07 VITALS — BP 117/78 | HR 82 | Temp 98.3°F | Resp 18 | Ht 73.0 in | Wt 208.8 lb

## 2021-06-07 DIAGNOSIS — F319 Bipolar disorder, unspecified: Secondary | ICD-10-CM

## 2021-06-07 MED ORDER — ARIPIPRAZOLE ER 400 MG IM PRSY
400.0000 mg | PREFILLED_SYRINGE | Freq: Once | INTRAMUSCULAR | Status: AC
  Administered 2021-06-07: 400 mg via INTRAMUSCULAR

## 2021-06-07 NOTE — Patient Instructions (Signed)
Pt in office today for due Abilify Maintena 400 mg injection. Pt guarded, quiet on approach with no eye contact. Injection prepared as ordered and given in left deltoid without c/o. Pt to return in approximately 4 weeks for next due injection.

## 2021-06-08 ENCOUNTER — Other Ambulatory Visit: Payer: Self-pay

## 2021-06-08 ENCOUNTER — Ambulatory Visit (INDEPENDENT_AMBULATORY_CARE_PROVIDER_SITE_OTHER): Payer: Federal, State, Local not specified - PPO

## 2021-06-08 DIAGNOSIS — E538 Deficiency of other specified B group vitamins: Secondary | ICD-10-CM

## 2021-06-08 MED ORDER — CYANOCOBALAMIN 1000 MCG/ML IJ SOLN
1000.0000 ug | Freq: Once | INTRAMUSCULAR | Status: AC
  Administered 2021-06-08: 1000 ug via INTRAMUSCULAR

## 2021-06-08 NOTE — Progress Notes (Signed)
Pt given B12 injection w/o any complications. 

## 2021-06-21 ENCOUNTER — Other Ambulatory Visit: Payer: Self-pay

## 2021-06-21 ENCOUNTER — Ambulatory Visit (HOSPITAL_COMMUNITY): Payer: Federal, State, Local not specified - PPO | Admitting: Licensed Clinical Social Worker

## 2021-07-06 ENCOUNTER — Ambulatory Visit (INDEPENDENT_AMBULATORY_CARE_PROVIDER_SITE_OTHER): Payer: Federal, State, Local not specified - PPO | Admitting: Podiatry

## 2021-07-06 ENCOUNTER — Other Ambulatory Visit: Payer: Self-pay | Admitting: Internal Medicine

## 2021-07-06 ENCOUNTER — Encounter: Payer: Self-pay | Admitting: Podiatry

## 2021-07-06 ENCOUNTER — Other Ambulatory Visit: Payer: Self-pay

## 2021-07-06 DIAGNOSIS — L603 Nail dystrophy: Secondary | ICD-10-CM | POA: Diagnosis not present

## 2021-07-06 DIAGNOSIS — L219 Seborrheic dermatitis, unspecified: Secondary | ICD-10-CM | POA: Insufficient documentation

## 2021-07-06 DIAGNOSIS — E538 Deficiency of other specified B group vitamins: Secondary | ICD-10-CM

## 2021-07-06 NOTE — Progress Notes (Signed)
°  Subjective:  Patient ID: Curtis Townsend, male    DOB: 14-Jan-1976,   MRN: [id number]  Chief Complaint  Patient presents with   debride    BL nails 1-10 hard to trimm     45 y.o. male presents for bilateral nails that are thickened painful and elongated. Relates he has always had thickened toenails that are difficult to trim.  . Denies any other pedal complaints. Denies n/v/f/c.   Past Medical History:  Diagnosis Date   Alcoholism ([place])    Anxiety    Atypical chest pain    Bipolar 1 disorder ([facility])    Depression    Depression    History of chicken pox    History of kidney stones    Hyperlipemia    no current med.   IBS (irritable bowel syndrome)    Lipoma of back 05/2012   right   Seasonal allergies    Wears partial dentures    upper    Objective:  Physical Exam: Vascular: DP/PT pulses 2/4 bilateral. CFT <3 seconds. Normal hair growth on digits. No edema.  Skin. No lacerations or abrasions bilateral feet. Nails 1-5 are thickened discolored and elongated with subungual debris.  Musculoskeletal: MMT 5/5 bilateral lower extremities in DF, PF, Inversion and Eversion. Deceased ROM in DF of ankle joint.  Neurological: Sensation intact to light touch.   Assessment:   1. Onychodystrophy      Plan:  Patient was evaluated and treated and all questions answered. -Examined patient -Discussed treatment options for painful dystrophic nails  -Nails 1-5 b/l were debrided without incident as a courtesy  -Recommend vicks vapo rub to keep nails soft.  -Patient to return as needed.   Lorenda Peck, DPM

## 2021-07-07 ENCOUNTER — Encounter (HOSPITAL_COMMUNITY): Payer: Self-pay | Admitting: *Deleted

## 2021-07-07 ENCOUNTER — Ambulatory Visit (HOSPITAL_BASED_OUTPATIENT_CLINIC_OR_DEPARTMENT_OTHER): Payer: Federal, State, Local not specified - PPO | Admitting: *Deleted

## 2021-07-07 VITALS — BP 103/70 | HR 78 | Temp 98.8°F | Resp 18 | Ht 73.0 in | Wt 213.2 lb

## 2021-07-07 DIAGNOSIS — F319 Bipolar disorder, unspecified: Secondary | ICD-10-CM | POA: Diagnosis not present

## 2021-07-07 MED ORDER — ARIPIPRAZOLE ER 400 MG IM PRSY
400.0000 mg | PREFILLED_SYRINGE | Freq: Once | INTRAMUSCULAR | Status: AC
  Administered 2021-07-07: 12:00:00 400 mg via INTRAMUSCULAR

## 2021-07-07 NOTE — Patient Instructions (Signed)
Pt in office today for due Abilify Maintena 400 mg injection. Pt appropriate and cooperative omn approach. No eye contact pt wearing sunglasses. Injection prepared as ordered and given in right deltoid without complaint. Pt to return in approximately 28 days for next due injection.

## 2021-07-08 ENCOUNTER — Ambulatory Visit (INDEPENDENT_AMBULATORY_CARE_PROVIDER_SITE_OTHER): Payer: Federal, State, Local not specified - PPO

## 2021-07-08 ENCOUNTER — Other Ambulatory Visit: Payer: Self-pay

## 2021-07-08 DIAGNOSIS — E538 Deficiency of other specified B group vitamins: Secondary | ICD-10-CM

## 2021-07-08 NOTE — Progress Notes (Signed)
Pt here for monthly B12 injection per Dr. Jones  B12 1000mcg given IM, and pt tolerated injection well.   

## 2021-07-20 ENCOUNTER — Other Ambulatory Visit: Payer: Self-pay

## 2021-07-20 ENCOUNTER — Ambulatory Visit (INDEPENDENT_AMBULATORY_CARE_PROVIDER_SITE_OTHER): Payer: Federal, State, Local not specified - PPO | Admitting: Licensed Clinical Social Worker

## 2021-07-20 DIAGNOSIS — F250 Schizoaffective disorder, bipolar type: Secondary | ICD-10-CM | POA: Diagnosis not present

## 2021-07-21 ENCOUNTER — Encounter (HOSPITAL_COMMUNITY): Payer: Self-pay | Admitting: Licensed Clinical Social Worker

## 2021-07-21 NOTE — Progress Notes (Signed)
Virtual Visit via Telephone Note  I connected with Curtis Townsend on 07/21/21 at 11:00 AM EST by telephone and verified that I am speaking with the correct person using two identifiers.  Location: Patient: home Provider: home office   I discussed the limitations, risks, security and privacy concerns of performing an evaluation and management service by telephone and the availability of in person appointments. I also discussed with the patient that there may be a patient responsible charge related to this service. The patient expressed understanding and agreed to proceed.  Type of Therapy: Individual Therapy   Treatment Goals addressed: "to make sure my mood is stable and to help me cope with having to be in my house all the time". [name] will demonstrate and report increased mood stabilization and coping skills 5 out of 7 days".   Interventions: CBT   Summary: Curtis Townsend is a 46 y.o. male who presents with Schizoaffective Disorder, mixed type   Suicidal/Homicidal: No without intent/plan   Therapist Response:   Curtis Townsend met with clinician for individual therapy. Curtis Townsend discussed his psychiatric symptoms and current life events. Curtis Townsend reports he is doing well. He shared that his mood and emotional health have been stable. He also reports that his physical health has been good. Clinician explored interactions at home with his wife, mother, and children. Curtis Townsend processed a recent conversation with father that was fine. Clinician utilized CBT to identify stressors and discussed coping skills for daily stressors.     Plan: Return again in 2 weeks.   Diagnosis: Schizoaffective Disorder, mixed type     I discussed the assessment and treatment plan with the patient. The patient was provided an opportunity to ask questions and all were answered. The patient agreed with the plan and demonstrated an understanding of the instructions.   The patient was advised to call back or seek an  in-person evaluation if the symptoms worsen or if the condition fails to improve as anticipated.  I provided 40 minutes of non-face-to-face time during this encounter.   Curtis Curling, LCSW

## 2021-08-03 ENCOUNTER — Other Ambulatory Visit (HOSPITAL_COMMUNITY): Payer: Self-pay | Admitting: *Deleted

## 2021-08-03 ENCOUNTER — Ambulatory Visit (HOSPITAL_COMMUNITY): Payer: Federal, State, Local not specified - PPO | Admitting: Licensed Clinical Social Worker

## 2021-08-03 ENCOUNTER — Telehealth (HOSPITAL_COMMUNITY): Payer: Self-pay | Admitting: *Deleted

## 2021-08-03 ENCOUNTER — Encounter (HOSPITAL_COMMUNITY): Payer: Self-pay | Admitting: Psychiatry

## 2021-08-03 ENCOUNTER — Telehealth (HOSPITAL_BASED_OUTPATIENT_CLINIC_OR_DEPARTMENT_OTHER): Payer: Federal, State, Local not specified - PPO | Admitting: Psychiatry

## 2021-08-03 ENCOUNTER — Other Ambulatory Visit: Payer: Self-pay

## 2021-08-03 DIAGNOSIS — F4310 Post-traumatic stress disorder, unspecified: Secondary | ICD-10-CM | POA: Diagnosis not present

## 2021-08-03 DIAGNOSIS — F319 Bipolar disorder, unspecified: Secondary | ICD-10-CM

## 2021-08-03 DIAGNOSIS — Z79899 Other long term (current) drug therapy: Secondary | ICD-10-CM

## 2021-08-03 DIAGNOSIS — F250 Schizoaffective disorder, bipolar type: Secondary | ICD-10-CM

## 2021-08-03 MED ORDER — ABILIFY MAINTENA 400 MG IM PRSY: 400.0000 mg | PREFILLED_SYRINGE | INTRAMUSCULAR | 2 refills | Status: DC

## 2021-08-03 NOTE — Telephone Encounter (Signed)
Writer spoke with pt regarding lab orders sent to [facility] [place] is familiar with this location.

## 2021-08-03 NOTE — Progress Notes (Signed)
Virtual Visit via Telephone Note  I connected with Curtis Townsend on 08/03/21 at 10:00 AM EST by telephone and verified that I am speaking with the correct person using two identifiers.  Location: Patient: Home Provider: Home Office   I discussed the limitations, risks, security and privacy concerns of performing an evaluation and management service by telephone and the availability of in person appointments. I also discussed with the patient that there may be a patient responsible charge related to this service. The patient expressed understanding and agreed to proceed.   History of Present Illness: Patient is evaluated by phone session.  He reported that this Christmas was quite an event okay.  His in-laws came on new year.  He feels the Abilify helping his mood irritability and anger and mania.  He denies any hallucination, paranoia or any agitation.  He is sleeping okay without trazodone and he has not taken trazodone more than 6 months.  His appetite is okay.  His weight is stable.  He denies any flashbacks, nightmares.  He is in therapy with [name] that helps his coping skills.  Patient lives with his wife and 3 daughters who are 31, 80 and 75-year-old.  His 46 year old mother also lives with them.  Patient admitted busy taking care of the family members.  He denies drinking or using any illegal substances.  He is getting Abilify injection regularly and he feels that is helping him.  He has no tremor or shakes or any EPS.  Past Psychiatric History:  H/O abuse, anger, paranoia, ADHD and poor impulse control. H/O suicidal attempt as tried to hang himself and sister saved him.  H/O multiple inpatient treatment and last inpatient in September 2019. H/O THC use. Took Prozac, Risperdal, Risperdal, Depakote, hydroxyzine, Depakote and Trazodone but discontinued due to noncompliance.     Psychiatric Specialty Exam: Physical Exam  Review of Systems  Weight 213 lb (96.6 kg).Body mass index is 28.1  kg/m.  General Appearance: NA  Eye Contact:  NA  Speech:  Slow  Volume:  Decreased  Mood:  Euthymic  Affect:  NA  Thought Process:  Descriptions of Associations: Intact  Orientation:  Full (Time, Place, and Person)  Thought Content:  WDL  Suicidal Thoughts:  No  Homicidal Thoughts:  No  Memory:  Immediate;   Good Recent;   Good Remote;   Fair  Judgement:  Intact  Insight:  Present  Psychomotor Activity:  NA  Concentration:  Concentration: Fair and Attention Span: Fair  Recall:  [employer] of Knowledge:  Fair  Language:  Good  Akathisia:  No  Handed:  Right  AIMS (if indicated):     Assets:  Communication Skills Desire for Improvement Housing Transportation  ADL's:  Intact  Cognition:  WNL  Sleep:   ok      Assessment and Plan: PTSD.  Bipolar disorder type I.  Patient is stable on his current medication.  He is compliant with Abilify injection.  Continue Abilify Maintena intramuscular 400 mg every 28 days.  We will do hemoglobin A1c which has not been in a while.  He has other labs done 3 months ago.  Discussed medication side effects and benefits.  Encouraged to continue therapy with [name].  Follow up in 3 months  Follow Up Instructions:    I discussed the assessment and treatment plan with the patient. The patient was provided an opportunity to ask questions and all were answered. The patient agreed with the plan and demonstrated an  understanding of the instructions.   The patient was advised to call back or seek an in-person evaluation if the symptoms worsen or if the condition fails to improve as anticipated.  I provided 15 minutes of non-face-to-face time during this encounter.   [name], MD

## 2021-08-04 ENCOUNTER — Ambulatory Visit (INDEPENDENT_AMBULATORY_CARE_PROVIDER_SITE_OTHER): Payer: Federal, State, Local not specified - PPO | Admitting: Licensed Clinical Social Worker

## 2021-08-04 ENCOUNTER — Encounter (HOSPITAL_COMMUNITY): Payer: Self-pay

## 2021-08-04 ENCOUNTER — Other Ambulatory Visit: Payer: Self-pay

## 2021-08-04 ENCOUNTER — Ambulatory Visit (HOSPITAL_BASED_OUTPATIENT_CLINIC_OR_DEPARTMENT_OTHER): Payer: Federal, State, Local not specified - PPO

## 2021-08-04 DIAGNOSIS — F250 Schizoaffective disorder, bipolar type: Secondary | ICD-10-CM | POA: Diagnosis not present

## 2021-08-04 DIAGNOSIS — F319 Bipolar disorder, unspecified: Secondary | ICD-10-CM

## 2021-08-04 MED ORDER — ARIPIPRAZOLE ER 400 MG IM PRSY
400.0000 mg | PREFILLED_SYRINGE | Freq: Once | INTRAMUSCULAR | Status: AC
  Administered 2021-08-04: 400 mg via INTRAMUSCULAR

## 2021-08-04 NOTE — Patient Instructions (Addendum)
Patient presented today for his due Abilify Maintena 400mg  injection. Patient was pleasant and cooperative upon approach. Patient very quiet today. When asked if he's having any problems with medications and if everything is ok otherwise, pt stated everything's good. Medication prepared and given as ordered in LEFT DELTOID. Patient to return in 28 days for next due injection

## 2021-08-08 ENCOUNTER — Ambulatory Visit (INDEPENDENT_AMBULATORY_CARE_PROVIDER_SITE_OTHER): Payer: Federal, State, Local not specified - PPO

## 2021-08-08 ENCOUNTER — Other Ambulatory Visit: Payer: Self-pay

## 2021-08-08 DIAGNOSIS — E538 Deficiency of other specified B group vitamins: Secondary | ICD-10-CM | POA: Diagnosis not present

## 2021-08-08 NOTE — Progress Notes (Signed)
B12 injection given and tolerated well.  

## 2021-08-10 ENCOUNTER — Encounter (HOSPITAL_COMMUNITY): Payer: Self-pay | Admitting: Licensed Clinical Social Worker

## 2021-08-10 ENCOUNTER — Ambulatory Visit: Payer: Federal, State, Local not specified - PPO

## 2021-08-10 NOTE — Progress Notes (Signed)
Virtual Visit via Telephone Note  I connected with Curtis Townsend on 08/10/21 at 11:00 AM EST by telephone and verified that I am speaking with the correct person using two identifiers.  Location: Patient: home Provider: home office   I discussed the limitations, risks, security and privacy concerns of performing an evaluation and management service by telephone and the availability of in person appointments. I also discussed with the patient that there may be a patient responsible charge related to this service. The patient expressed understanding and agreed to proceed.  Type of Therapy: Individual Therapy   Treatment Goals addressed: "to make sure my mood is stable and to help me cope with having to be in my house all the time". Curtis Townsend will demonstrate and report increased mood stabilization and coping skills 5 out of 7 days".   Interventions: CBT   Summary: Curtis Townsend is a 46 y.o. male who presents with Schizoaffective Disorder, mixed type   Suicidal/Homicidal: No without intent/plan   Therapist Response:   Mikaele met with clinician for individual therapy. Royden discussed his psychiatric symptoms and current life events. Devin reports he is doing well. Clinician utilized CBT to process thoughts and feelings during interactions with children, wife, and mother. Clinician identified coping skills being used and what is working. Curtis Townsend reports his mood has been stable and he has been getting better, more consistent rest. Clinician checked in on other activities of daily life, noting that he has been doing well with maintaining the house and working with his kids on school. Curtis Townsend reports meds have been helpful in maintaining stability.      Plan: Return again in 3-4 weeks.   Diagnosis: Schizoaffective Disorder, mixed type       I discussed the assessment and treatment plan with the patient. The patient was provided an opportunity to ask questions and all were answered.  The patient agreed with the plan and demonstrated an understanding of the instructions.   The patient was advised to call back or seek an in-person evaluation if the symptoms worsen or if the condition fails to improve as anticipated.  I provided 20 minutes of non-face-to-face time during this encounter.   Curtis Curling, LCSW

## 2021-08-31 DIAGNOSIS — H401124 Primary open-angle glaucoma, left eye, indeterminate stage: Secondary | ICD-10-CM | POA: Diagnosis not present

## 2021-08-31 DIAGNOSIS — H401114 Primary open-angle glaucoma, right eye, indeterminate stage: Secondary | ICD-10-CM | POA: Diagnosis not present

## 2021-08-31 DIAGNOSIS — H2513 Age-related nuclear cataract, bilateral: Secondary | ICD-10-CM | POA: Diagnosis not present

## 2021-09-01 ENCOUNTER — Ambulatory Visit (INDEPENDENT_AMBULATORY_CARE_PROVIDER_SITE_OTHER): Payer: Federal, State, Local not specified - PPO | Admitting: Licensed Clinical Social Worker

## 2021-09-01 ENCOUNTER — Other Ambulatory Visit: Payer: Self-pay

## 2021-09-01 ENCOUNTER — Encounter (HOSPITAL_COMMUNITY): Payer: Self-pay | Admitting: Licensed Clinical Social Worker

## 2021-09-01 DIAGNOSIS — F250 Schizoaffective disorder, bipolar type: Secondary | ICD-10-CM

## 2021-09-01 NOTE — Progress Notes (Signed)
Virtual Visit via Telephone Note  I connected with Helyn Numbers on 09/01/21 at 10:00 AM EST by telephone and verified that I am speaking with the correct person using two identifiers.  Location: Patient: home Provider: home office   I discussed the limitations, risks, security and privacy concerns of performing an evaluation and management service by telephone and the availability of in person appointments. I also discussed with the patient that there may be a patient responsible charge related to this service. The patient expressed understanding and agreed to proceed.   I discussed the assessment and treatment plan with the patient. The patient was provided an opportunity to ask questions and all were answered. The patient agreed with the plan and demonstrated an understanding of the instructions.   The patient was advised to call back or seek an in-person evaluation if the symptoms worsen or if the condition fails to improve as anticipated.  I provided 20 minutes of non-face-to-face time during this encounter.   [name], LCSW   THERAPIST PROGRESS NOTE  Session Time: 10:00am-10:20am  Participation Level: Active  Behavioral Response: NeatAlertEuthymic  Type of Therapy: Individual Therapy  Treatment Goals addressed: "to make sure my mood is stable and to help me cope with having to be in my house all the time". Aleksey will demonstrate and report increased mood stabilization and coping skills 5 out of 7 days".  ProgressTowards Goals: Progressing  Interventions: Motivational Interviewing  Summary: DORAN NESTLE is a 46 y.o. male who presents with Schizoaffective Disorder, mixed type.   Suicidal/Homicidal: Nowithout intent/plan  Therapist Response: Clinician engaged in virtual telephone individual session with [place]. Clinician utilized MI OARS to reflect and summarize thoughts and feelings as Devian processed recent concerns about health. Clinician  explored treatment options for glaucoma.Clinician processed options and noted that Finnick is taking a conservative approach to treatment, starting with least intrusive first. Clinician discussed decision-making process and noted increase in logical thought, rather than allowing anxiety to make decisions for him.  Clinician noted that Ziad reported a new tremor in his hands. Clinician explored the intrusiveness of this tremor, which was noticed first by opthamologist. Clinician reported this to [name]. Goodwin is scheduled for his Abilify Maintena injection next week and will follow up with nurse.   Plan: Return again in 3 weeks.  Diagnosis: Schizoaffective disorder, mixed type ([place])  Collaboration of Care: Psychiatrist AEB Communicated to [name] and nurse about tremor in hand.   Patient/Guardian was advised Release of Information must be obtained prior to any record release in order to collaborate their care with an outside provider. Patient/Guardian was advised if they have not already done so to contact the registration department to sign all necessary forms in order for [name] to release information regarding their care.   Consent: Patient/Guardian gives verbal consent for treatment and assignment of benefits for services provided during this visit. Patient/Guardian expressed understanding and agreed to proceed.   Jobe Marker [place], LCSW 09/01/2021

## 2021-09-05 ENCOUNTER — Telehealth: Payer: Self-pay | Admitting: Internal Medicine

## 2021-09-05 NOTE — Telephone Encounter (Signed)
LVM for Curtis Townsend stating that B12 injections are monthly and that labs would be rechecked at the next OV.

## 2021-09-05 NOTE — Telephone Encounter (Signed)
Pts spouse inquiring how long pt has to continue taking the b12 injections, also if pts levels needs to be rechecked   Spouse requesting a c/b

## 2021-09-06 ENCOUNTER — Encounter (HOSPITAL_COMMUNITY): Payer: Self-pay

## 2021-09-06 ENCOUNTER — Ambulatory Visit (HOSPITAL_BASED_OUTPATIENT_CLINIC_OR_DEPARTMENT_OTHER): Payer: Federal, State, Local not specified - PPO

## 2021-09-06 ENCOUNTER — Other Ambulatory Visit: Payer: Self-pay

## 2021-09-06 DIAGNOSIS — F319 Bipolar disorder, unspecified: Secondary | ICD-10-CM

## 2021-09-06 MED ORDER — ARIPIPRAZOLE ER 400 MG IM PRSY
400.0000 mg | PREFILLED_SYRINGE | Freq: Once | INTRAMUSCULAR | Status: AC
Start: 2021-09-06 — End: 2021-09-06
  Administered 2021-09-06: 400 mg via INTRAMUSCULAR

## 2021-09-06 NOTE — Patient Instructions (Addendum)
Patient presented today for his due Abilify Maintena 400mg  injection. Pt was cooperative and pleasant upon approach but very quiet and subdued. Medication was prepared and given as ordered in pt's RIGHT DELTOID. Pt to return in 28 days for next due injection.

## 2021-09-06 NOTE — Telephone Encounter (Signed)
Noted  VM was left on contact number for pt

## 2021-09-06 NOTE — Telephone Encounter (Signed)
Spouse  checking status of c/b, advised caller of cma's message and recommendations   Caller making cma aware she does not have her VM set up

## 2021-09-08 ENCOUNTER — Other Ambulatory Visit: Payer: Self-pay

## 2021-09-08 ENCOUNTER — Ambulatory Visit (INDEPENDENT_AMBULATORY_CARE_PROVIDER_SITE_OTHER): Payer: Federal, State, Local not specified - PPO | Admitting: *Deleted

## 2021-09-08 DIAGNOSIS — E538 Deficiency of other specified B group vitamins: Secondary | ICD-10-CM

## 2021-09-08 MED ORDER — CYANOCOBALAMIN 1000 MCG/ML IJ SOLN
1000.0000 ug | Freq: Once | INTRAMUSCULAR | Status: AC
  Administered 2021-09-08: 1000 ug via INTRAMUSCULAR

## 2021-09-08 NOTE — Progress Notes (Signed)
Patient is here for b12 injection . Given in left deltoid. Patient tolerated well.  ? ?Please co sign  ?

## 2021-09-09 ENCOUNTER — Ambulatory Visit: Payer: Federal, State, Local not specified - PPO

## 2021-09-30 DIAGNOSIS — H401132 Primary open-angle glaucoma, bilateral, moderate stage: Secondary | ICD-10-CM | POA: Diagnosis not present

## 2021-10-04 ENCOUNTER — Other Ambulatory Visit: Payer: Self-pay

## 2021-10-04 ENCOUNTER — Ambulatory Visit (HOSPITAL_BASED_OUTPATIENT_CLINIC_OR_DEPARTMENT_OTHER): Payer: Federal, State, Local not specified - PPO | Admitting: *Deleted

## 2021-10-04 VITALS — BP 123/73 | HR 91 | Temp 98.5°F | Resp 18 | Ht 73.0 in | Wt 212.4 lb

## 2021-10-04 DIAGNOSIS — F319 Bipolar disorder, unspecified: Secondary | ICD-10-CM

## 2021-10-04 MED ORDER — ARIPIPRAZOLE ER 400 MG IM PRSY
400.0000 mg | PREFILLED_SYRINGE | Freq: Once | INTRAMUSCULAR | Status: AC
  Administered 2021-10-04: 400 mg via INTRAMUSCULAR

## 2021-10-04 NOTE — Patient Instructions (Signed)
Pt in clinic today for due Abilify Maintena 400 mg injection. Pt guarded but cooperative on approach. No eye contact as pt had dark glasses on. Affect flat. Injection was prepared and given as ordered in left deltoid without complaint. Pt denies any issues with sleep, depression or AVH. Pt to return in approximately 28 days for next due injection. ?

## 2021-10-10 ENCOUNTER — Ambulatory Visit (INDEPENDENT_AMBULATORY_CARE_PROVIDER_SITE_OTHER): Payer: Federal, State, Local not specified - PPO

## 2021-10-10 DIAGNOSIS — E538 Deficiency of other specified B group vitamins: Secondary | ICD-10-CM

## 2021-10-10 NOTE — Progress Notes (Signed)
Pt here for monthly B12 injection per Dr. Ronnald Ramp ? ?B12 1035mg given IM, and pt tolerated injection well. ? ?Next B12 injection scheduled for 11/10/2021 ?

## 2021-10-20 ENCOUNTER — Ambulatory Visit (INDEPENDENT_AMBULATORY_CARE_PROVIDER_SITE_OTHER): Payer: Federal, State, Local not specified - PPO | Admitting: Internal Medicine

## 2021-10-20 ENCOUNTER — Encounter: Payer: Self-pay | Admitting: Internal Medicine

## 2021-10-20 VITALS — BP 112/60 | HR 78 | Temp 98.1°F | Ht 73.0 in | Wt 214.0 lb

## 2021-10-20 DIAGNOSIS — L219 Seborrheic dermatitis, unspecified: Secondary | ICD-10-CM

## 2021-10-20 DIAGNOSIS — E538 Deficiency of other specified B group vitamins: Secondary | ICD-10-CM | POA: Diagnosis not present

## 2021-10-20 DIAGNOSIS — G63 Polyneuropathy in diseases classified elsewhere: Secondary | ICD-10-CM | POA: Diagnosis not present

## 2021-10-20 DIAGNOSIS — Z0000 Encounter for general adult medical examination without abnormal findings: Secondary | ICD-10-CM | POA: Diagnosis not present

## 2021-10-20 DIAGNOSIS — Z800 Family history of malignant neoplasm of digestive organs: Secondary | ICD-10-CM

## 2021-10-20 DIAGNOSIS — Z1211 Encounter for screening for malignant neoplasm of colon: Secondary | ICD-10-CM

## 2021-10-20 DIAGNOSIS — E785 Hyperlipidemia, unspecified: Secondary | ICD-10-CM | POA: Diagnosis not present

## 2021-10-20 LAB — CBC WITH DIFFERENTIAL/PLATELET
Basophils Absolute: 0.1 10*3/uL (ref 0.0–0.1)
Basophils Relative: 1 % (ref 0.0–3.0)
Eosinophils Absolute: 0 10*3/uL (ref 0.0–0.7)
Eosinophils Relative: 0.7 % (ref 0.0–5.0)
HCT: 48.3 % (ref 39.0–52.0)
Hemoglobin: 16.3 g/dL (ref 13.0–17.0)
Lymphocytes Relative: 33.3 % (ref 12.0–46.0)
Lymphs Abs: 2.1 10*3/uL (ref 0.7–4.0)
MCHC: 33.6 g/dL (ref 30.0–36.0)
MCV: 92.1 fl (ref 78.0–100.0)
Monocytes Absolute: 0.4 10*3/uL (ref 0.1–1.0)
Monocytes Relative: 6.5 % (ref 3.0–12.0)
Neutro Abs: 3.7 10*3/uL (ref 1.4–7.7)
Neutrophils Relative %: 58.5 % (ref 43.0–77.0)
Platelets: 164 10*3/uL (ref 150.0–400.0)
RBC: 5.25 Mil/uL (ref 4.22–5.81)
RDW: 13.2 % (ref 11.5–15.5)
WBC: 6.3 10*3/uL (ref 4.0–10.5)

## 2021-10-20 LAB — PSA: PSA: 1.82 ng/mL (ref 0.10–4.00)

## 2021-10-20 LAB — LIPID PANEL
Cholesterol: 264 mg/dL — ABNORMAL HIGH (ref 0–200)
HDL: 55.8 mg/dL (ref >39.00)
LDL Cholesterol: 192 mg/dL — ABNORMAL HIGH (ref 0–99)
NonHDL: 208.61
Total CHOL/HDL Ratio: 5
Triglycerides: 85 mg/dL (ref 0.0–149.0)
VLDL: 17 mg/dL (ref 0.0–40.0)

## 2021-10-20 LAB — FOLATE: Folate: 11.9 ng/mL (ref >5.9)

## 2021-10-20 MED ORDER — KETOCONAZOLE 2 % EX SHAM
1.0000 | MEDICATED_SHAMPOO | CUTANEOUS | 2 refills | Status: DC
Start: 2021-10-20 — End: 2022-11-18

## 2021-10-20 NOTE — Progress Notes (Addendum)
? ?Subjective:  ?Patient ID: Curtis Townsend, male    DOB: [birth year]  Age: 46 y.o. MRN: [id number] ? ?CC: Annual Exam and Rash ? ? ?HPI ?Helyn Numbers presents for a CPX and f/up - ? ?He complains of a recurrent, itchy rash on his scalp.  He has been diagnosed with seborrheic dermatitis and has been using a topical steroid without much symptom relief. ? ?Outpatient Medications Prior to Visit  ?Medication Sig Dispense Refill  ? ARIPiprazole ER (ABILIFY MAINTENA) 400 MG PRSY prefilled syringe Inject 400 mg into the muscle every 28 (twenty-eight) days. 1 each 2  ? folic acid (FOLVITE) 1 MG tablet TAKE 1 TABLET BY MOUTH EVERY DAY 90 tablet 1  ? ?Facility-Administered Medications Prior to Visit  ?Medication Dose Route Frequency Provider Last Rate Last Admin  ? cyanocobalamin ((VITAMIN B-12)) injection 1,000 mcg  1,000 mcg Intramuscular Q30 days Janith Lima, MD   1,000 mcg at 10/10/21 1039  ? ? ?ROS ?Review of Systems  ?Constitutional: Negative.  Negative for diaphoresis and fatigue.  ?HENT: Negative.    ?Eyes: Negative.   ?Respiratory:  Negative for cough and shortness of breath.   ?Cardiovascular:  Negative for chest pain, palpitations and leg swelling.  ?Gastrointestinal:  Negative for abdominal pain, diarrhea and nausea.  ?Endocrine: Negative.   ?Genitourinary: Negative.  Negative for scrotal swelling and testicular pain.  ?Musculoskeletal: Negative.   ?Skin:  Positive for rash.  ?Neurological: Negative.  Negative for weakness.  ?Hematological:  Negative for adenopathy. Does not bruise/bleed easily.  ?Psychiatric/Behavioral: Negative.    ? ?Objective:  ?BP 112/60 (BP Location: Left Arm, Patient Position: Sitting, Cuff Size: Large)   Pulse 78   Temp 98.1 ?F (36.7 ?C) (Oral)   Ht '6\' 1"'$  (1.854 m)   Wt 214 lb (97.1 kg)   SpO2 99%   BMI 28.23 kg/m?  ? ?BP Readings from Last 3 Encounters:  ?10/20/21 112/60  ?02/09/21 112/78  ?12/30/20 122/64  ? ? ?Wt Readings from Last 3 Encounters:  ?10/20/21 214 lb (97.1  kg)  ?02/09/21 204 lb 9.6 oz (92.8 kg)  ?12/30/20 208 lb (94.3 kg)  ? ? ?Physical Exam ?Vitals reviewed.  ?Constitutional:   ?   Appearance: Normal appearance.  ?HENT:  ?   Nose: Nose normal.  ?   Mouth/Throat:  ?   Mouth: Mucous membranes are moist.  ?Eyes:  ?   General: No scleral icterus. ?   Conjunctiva/sclera: Conjunctivae normal.  ?Cardiovascular:  ?   Rate and Rhythm: Normal rate and regular rhythm.  ?   Heart sounds: No murmur heard. ?Pulmonary:  ?   Effort: Pulmonary effort is normal.  ?   Breath sounds: No stridor. No wheezing, rhonchi or rales.  ?Abdominal:  ?   General: Abdomen is flat.  ?   Palpations: There is no mass.  ?   Tenderness: There is no abdominal tenderness. There is no guarding.  ?   Hernia: No hernia is present.  ?Genitourinary: ?   Comments: GU/rectal exam deferred at his request. ?Musculoskeletal:  ?   Cervical back: Neck supple.  ?Lymphadenopathy:  ?   Cervical: No cervical adenopathy.  ?Skin: ?   General: Skin is warm.  ?   Findings: Rash present. Rash is papular and scaling. Rash is not crusting, macular, nodular, purpuric, pustular, urticarial or vesicular.  ?   Comments: Across the scalp there are scattered areas of coalesced papules with erythema and scale.  ?Neurological:  ?   General: No  focal deficit present.  ?   Mental Status: He is alert. Mental status is at baseline.  ? ? ?Lab Results  ?Component Value Date  ? WBC 6.3 10/20/2021  ? HGB 16.3 10/20/2021  ? HCT 48.3 10/20/2021  ? PLT 164.0 10/20/2021  ? GLUCOSE 80 02/09/2021  ? CHOL 264 (H) 10/20/2021  ? TRIG 85.0 10/20/2021  ? HDL 55.80 10/20/2021  ? LDLCALC 192 (H) 10/20/2021  ? ALT 14 02/09/2021  ? AST 15 02/09/2021  ? NA 142 02/09/2021  ? K 4.1 02/09/2021  ? CL 106 02/09/2021  ? CREATININE 1.20 02/09/2021  ? BUN 10 02/09/2021  ? CO2 30 02/09/2021  ? TSH 0.78 09/07/2020  ? PSA 1.82 10/20/2021  ? HGBA1C 5.5 09/17/2020  ? ? ?CT ABDOMEN PELVIS WO CONTRAST ? ?Result Date: 12/29/2020 ?CLINICAL DATA:  Right flank pain and  hematuria. EXAM: CT ABDOMEN AND PELVIS WITHOUT CONTRAST TECHNIQUE: Multidetector CT imaging of the abdomen and pelvis was performed following the standard protocol without IV contrast. COMPARISON:  11/03/2017 FINDINGS: Lower chest: Minimal streaky dependent subpleural atelectasis but no infiltrates or effusions. The heart is normal in size. No pericardial effusion. The distal esophagus is normal. Hepatobiliary: No hepatic lesions or intrahepatic biliary dilatation. The gallbladder is unremarkable. No common bile duct dilatation. Pancreas: No mass, inflammation or ductal dilatation. Spleen: Normal size.  No focal lesions. Adrenals/Urinary Tract: The adrenal glands are unremarkable. Small lower pole left renal calculus. No right-sided renal calculi. There is mild right hydroureteronephrosis down to an obstructing 3 mm calculus in the upper right ureter located at the L3-4 disc space level. No left-sided ureteral calculi. No bladder calculi. No worrisome renal or bladder lesions without contrast. Down to an obstructing Stomach/Bowel: The stomach, duodenum, small bowel and colon are grossly normal without oral contrast. No inflammatory changes, mass lesions or obstructive findings. The appendix is normal. Vascular/Lymphatic: The aorta is normal in caliber. No atheroscerlotic calcifications. No mesenteric of retroperitoneal mass or adenopathy. Small scattered lymph nodes are noted. Reproductive: The prostate gland and seminal vesicles are unremarkable. Other: No pelvic mass or adenopathy. No free pelvic fluid collections. No inguinal mass or adenopathy. No abdominal wall hernia or subcutaneous lesions. Musculoskeletal: No significant bony findings. IMPRESSION: 1. 3 mm upper right ureteral calculus causing mild right-sided hydroureteronephrosis. 2. Small lower pole left renal calculus. 3. No other significant abdominal/pelvic findings, mass lesions or adenopathy. Electronically Signed   By: Marijo Sanes M.D.   On:  12/29/2020 08:17  ? ? ?Assessment & Plan:  ? ?Donavan was seen today for annual exam and rash. ? ?Diagnoses and all orders for this visit: ? ?Vitamin B12 deficiency neuropathy ([place])- I encouraged he and his wife to continue parenteral B12 replacement therapy. ?-     Folate; Future ?-     CBC with Differential/Platelet; Future ?-     CBC with Differential/Platelet ?-     Folate ? ?Routine general medical examination at a health care facility- Exam completed, labs reviewed, vaccines are up-to-date, cancer screenings addressed, patient education material was given. ?-     Lipid panel; Future ?-     PSA; Future ?-     PSA ?-     Lipid panel ? ?Dietary folate deficiency- His folate level is normal now. ?-     Folate; Future ?-     CBC with Differential/Platelet; Future ?-     CBC with Differential/Platelet ?-     Folate ? ?Seborrheic dermatitis ?-     ketoconazole (  NIZORAL) 2 % shampoo; Apply 1 application. topically 2 (two) times a week. ? ?Family history of colon cancer in father ? ?Screen for colon cancer ?-     Cologuard ? ? ?I am having Helyn Numbers start on ketoconazole. I am also having him maintain his folic acid and Abilify Maintena. We will continue to administer cyanocobalamin. ? ?Meds ordered this encounter  ?Medications  ? ketoconazole (NIZORAL) 2 % shampoo  ?  Sig: Apply 1 application. topically 2 (two) times a week.  ?  Dispense:  120 mL  ?  Refill:  2  ? ? ? ?Follow-up: Return in about 6 months (around 04/21/2022). ? ?Scarlette Calico, MD ?

## 2021-10-20 NOTE — Patient Instructions (Signed)

## 2021-10-24 DIAGNOSIS — H2513 Age-related nuclear cataract, bilateral: Secondary | ICD-10-CM | POA: Diagnosis not present

## 2021-10-24 DIAGNOSIS — H401114 Primary open-angle glaucoma, right eye, indeterminate stage: Secondary | ICD-10-CM | POA: Diagnosis not present

## 2021-10-24 DIAGNOSIS — H401124 Primary open-angle glaucoma, left eye, indeterminate stage: Secondary | ICD-10-CM | POA: Diagnosis not present

## 2021-10-25 DIAGNOSIS — E785 Hyperlipidemia, unspecified: Secondary | ICD-10-CM | POA: Insufficient documentation

## 2021-10-25 MED ORDER — ROSUVASTATIN CALCIUM 20 MG PO TABS: 20.0000 mg | ORAL_TABLET | Freq: Every day | ORAL | 1 refills | Status: DC

## 2021-10-25 NOTE — Addendum Note (Signed)
Addended by: Janith Lima on: 10/25/2021 05:08 PM ? ? Modules accepted: Orders ? ?

## 2021-11-01 ENCOUNTER — Encounter (HOSPITAL_COMMUNITY): Payer: Self-pay | Admitting: *Deleted

## 2021-11-01 ENCOUNTER — Ambulatory Visit (HOSPITAL_BASED_OUTPATIENT_CLINIC_OR_DEPARTMENT_OTHER): Payer: Federal, State, Local not specified - PPO | Admitting: *Deleted

## 2021-11-01 VITALS — BP 149/74 | HR 73 | Temp 98.5°F | Resp 18 | Ht 73.0 in | Wt 216.2 lb

## 2021-11-01 DIAGNOSIS — F319 Bipolar disorder, unspecified: Secondary | ICD-10-CM | POA: Diagnosis not present

## 2021-11-01 MED ORDER — ARIPIPRAZOLE ER 400 MG IM PRSY
400.0000 mg | PREFILLED_SYRINGE | Freq: Once | INTRAMUSCULAR | Status: AC
  Administered 2021-11-01: 400 mg via INTRAMUSCULAR

## 2021-11-01 NOTE — Patient Instructions (Signed)
Pt presents today for due Abilify Maintena 400 mg injection. Pt affect flat however there was eye contact made by pt today. Pt VSS. Injection prepared as ordered and given in right deltoid without complaints. This nurse did notice coarse tremors involving right hand. Pt is concerned about this. He denies any muscle stiffness, akathisia, or tremors on left hand. No tremors observed in left hand. Pt has an appointment with [name] tomorrow and will discuss concerns with provider. Pt to return in approximately 28 days for next due injection. ?

## 2021-11-02 ENCOUNTER — Telehealth (HOSPITAL_BASED_OUTPATIENT_CLINIC_OR_DEPARTMENT_OTHER): Payer: Federal, State, Local not specified - PPO | Admitting: Psychiatry

## 2021-11-02 ENCOUNTER — Encounter (HOSPITAL_COMMUNITY): Payer: Self-pay | Admitting: Psychiatry

## 2021-11-02 ENCOUNTER — Telehealth (HOSPITAL_COMMUNITY): Payer: Federal, State, Local not specified - PPO | Admitting: Psychiatry

## 2021-11-02 VITALS — Wt 216.0 lb

## 2021-11-02 DIAGNOSIS — F319 Bipolar disorder, unspecified: Secondary | ICD-10-CM | POA: Diagnosis not present

## 2021-11-02 DIAGNOSIS — F4310 Post-traumatic stress disorder, unspecified: Secondary | ICD-10-CM | POA: Diagnosis not present
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
## 2021-11-08 ENCOUNTER — Encounter (HOSPITAL_COMMUNITY): Payer: Self-pay | Admitting: Licensed Clinical Social Worker

## 2021-11-08 ENCOUNTER — Telehealth (HOSPITAL_COMMUNITY): Payer: Self-pay | Admitting: *Deleted

## 2021-11-08 ENCOUNTER — Ambulatory Visit (INDEPENDENT_AMBULATORY_CARE_PROVIDER_SITE_OTHER): Payer: Federal, State, Local not specified - PPO | Admitting: Licensed Clinical Social Worker

## 2021-11-08 ENCOUNTER — Other Ambulatory Visit (HOSPITAL_COMMUNITY): Payer: Self-pay | Admitting: *Deleted

## 2021-11-08 DIAGNOSIS — F250 Schizoaffective disorder, bipolar type: Secondary | ICD-10-CM | POA: Diagnosis not present

## 2021-11-08 MED ORDER — BENZTROPINE MESYLATE 0.5 MG PO TABS: 0.5000 mg | ORAL_TABLET | Freq: Every day | ORAL | 2 refills | Status: DC
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
## 2021-11-08 NOTE — Telephone Encounter (Signed)
Writer spoke with pt to advise that Rx for Cogentin 0.5 mg tablets have been sent to his pharmacy; [facility] on [address]. For course tremors in left hand. Pt advised to take at HS. Pt verbalizes understanding and was encouraged to call office/nurse with update on result of medication. Pt agrees.  ?

## 2021-11-10 ENCOUNTER — Ambulatory Visit (INDEPENDENT_AMBULATORY_CARE_PROVIDER_SITE_OTHER): Payer: Federal, State, Local not specified - PPO

## 2021-11-10 DIAGNOSIS — E538 Deficiency of other specified B group vitamins: Secondary | ICD-10-CM | POA: Diagnosis not present

## 2021-11-10 NOTE — Progress Notes (Signed)
Pt here for monthly B12 injection per Dr. Ronnald Ramp ? ?B12 1056mg given IM, and pt tolerated injection well. ? ?Next B12 injection scheduled for 12/12/21 ?

## 2021-11-30 ENCOUNTER — Ambulatory Visit (INDEPENDENT_AMBULATORY_CARE_PROVIDER_SITE_OTHER): Payer: Federal, State, Local not specified - PPO | Admitting: Licensed Clinical Social Worker

## 2021-11-30 DIAGNOSIS — F250 Schizoaffective disorder, bipolar type: Secondary | ICD-10-CM | POA: Diagnosis not present

## 2021-12-01 ENCOUNTER — Ambulatory Visit (HOSPITAL_COMMUNITY): Payer: Federal, State, Local not specified - PPO

## 2021-12-01 ENCOUNTER — Encounter (HOSPITAL_COMMUNITY): Payer: Self-pay | Admitting: Licensed Clinical Social Worker

## 2021-12-01 NOTE — Progress Notes (Signed)
Virtual Visit via Telephone Note  I connected with Helyn Numbers on 12/01/21 at 11:00 AM EDT by telephone and verified that I am speaking with the correct person using two identifiers.  Location: Patient: home Provider: home office   I discussed the limitations, risks, security and privacy concerns of performing an evaluation and management service by telephone and the availability of in person appointments. I also discussed with the patient that there may be a patient responsible charge related to this service. The patient expressed understanding and agreed to proceed.    I discussed the assessment and treatment plan with the patient. The patient was provided an opportunity to ask questions and all were answered. The patient agreed with the plan and demonstrated an understanding of the instructions.   The patient was advised to call back or seek an in-person evaluation if the symptoms worsen or if the condition fails to improve as anticipated.  I provided 25 minutes of non-face-to-face time during this encounter.   [name], LCSW   THERAPIST PROGRESS NOTE  Session Time: 11:00am-11:25am  Participation Level: Active  Behavioral Response: NAAlertEuthymic  Type of Therapy: Individual Therapy  Treatment Goals addressed: : "to make sure my mood is stable and to help me cope with having to be in my house all the time". Praneeth will demonstrate and report increased mood stabilization and coping skills 5 out of 7 days".    ProgressTowards Goals: Progressing  Interventions: CBT  Summary: ROQUE SCHILL is a 46 y.o. male who presents with Schizoaffective Disorder, mixed type.   Suicidal/Homicidal: Nowithout intent/plan  Therapist Response: [place] engaged well in individual session with clinician. Clinician utilized CBT to process updates in mood, health, and interactions with family. Aadyn identified ongoing positive updates in relationships with family. He shared  that he does not really socialize with people outside of his family. Clinician explored comfort with his support system and noted that this is enough for [facility] Clinician discussed mood stability and noted that [place] has been doing well with getting his Abilify Maintena injection monthly. Clinician explored plans for the summer and processed some local activities for his children that are low cost or free.   Plan: Return again in 4 weeks. Alyssa is stable, but reports interest in monthly check in appointments at this time.   Diagnosis: Schizoaffective disorder, mixed type ([place])  Collaboration of Care: Other none required  Patient/Guardian was advised Release of Information must be obtained prior to any record release in order to collaborate their care with an outside provider. Patient/Guardian was advised if they have not already done so to contact the registration department to sign all necessary forms in order for [name] to release information regarding their care.   Consent: Patient/Guardian gives verbal consent for treatment and assignment of benefits for services provided during this visit. Patient/Guardian expressed understanding and agreed to proceed.   [place], LCSW 12/01/2021

## 2021-12-06 ENCOUNTER — Ambulatory Visit (HOSPITAL_BASED_OUTPATIENT_CLINIC_OR_DEPARTMENT_OTHER): Payer: Federal, State, Local not specified - PPO | Admitting: *Deleted

## 2021-12-06 ENCOUNTER — Encounter (HOSPITAL_COMMUNITY): Payer: Self-pay | Admitting: *Deleted

## 2021-12-06 VITALS — BP 105/75 | HR 79 | Temp 97.4°F | Ht 73.0 in | Wt 215.2 lb

## 2021-12-06 DIAGNOSIS — F250 Schizoaffective disorder, bipolar type: Secondary | ICD-10-CM | POA: Diagnosis not present

## 2021-12-06 MED ORDER — ARIPIPRAZOLE ER 400 MG IM PRSY
400.0000 mg | PREFILLED_SYRINGE | Freq: Once | INTRAMUSCULAR | Status: AC
  Administered 2021-12-06: 400 mg via INTRAMUSCULAR

## 2021-12-06 NOTE — Patient Instructions (Signed)
Pt in office today for due Abilify Maintena 400 mg injection. Pt affect flat with very minimal eye contact. Seclsuive to self, guarded. Tremors in L hand seemed to have diminished. Pt agrees. Cooperative with injection. Injection prepared as ordered and given in LD without complaint. Pt to return in approximately 28 days for next due injection. Pt encouraged to call office with any questions or concerns prior to next visit with [name]. Pt agrees.

## 2021-12-12 ENCOUNTER — Ambulatory Visit (INDEPENDENT_AMBULATORY_CARE_PROVIDER_SITE_OTHER): Payer: Federal, State, Local not specified - PPO

## 2021-12-12 DIAGNOSIS — E538 Deficiency of other specified B group vitamins: Secondary | ICD-10-CM | POA: Diagnosis not present

## 2021-12-12 DIAGNOSIS — G63 Polyneuropathy in diseases classified elsewhere: Secondary | ICD-10-CM

## 2021-12-12 MED ORDER — CYANOCOBALAMIN 1000 MCG/ML IJ SOLN
1000.0000 ug | Freq: Once | INTRAMUSCULAR | Status: AC
  Administered 2021-12-12: 1000 ug via INTRAMUSCULAR

## 2021-12-12 NOTE — Progress Notes (Signed)
After obtaining consent, and per orders of Dr. Jones, injection of B12 given by Ayce Pietrzyk. Patient tolerated injection well in left deltoid and instructed to report any adverse reaction to me immediately.  

## 2021-12-28 ENCOUNTER — Ambulatory Visit (HOSPITAL_COMMUNITY): Payer: Federal, State, Local not specified - PPO | Admitting: Licensed Clinical Social Worker

## 2022-01-05 ENCOUNTER — Ambulatory Visit (HOSPITAL_BASED_OUTPATIENT_CLINIC_OR_DEPARTMENT_OTHER): Payer: Federal, State, Local not specified - PPO

## 2022-01-05 ENCOUNTER — Encounter (HOSPITAL_COMMUNITY): Payer: Self-pay

## 2022-01-05 DIAGNOSIS — F319 Bipolar disorder, unspecified: Secondary | ICD-10-CM | POA: Diagnosis not present

## 2022-01-05 DIAGNOSIS — F4310 Post-traumatic stress disorder, unspecified: Secondary | ICD-10-CM

## 2022-01-05 MED ORDER — ABILIFY MAINTENA 400 MG IM PRSY: 400.0000 mg | PREFILLED_SYRINGE | INTRAMUSCULAR | 2 refills | Status: DC

## 2022-01-05 MED ORDER — ARIPIPRAZOLE ER 400 MG IM PRSY
400.0000 mg | PREFILLED_SYRINGE | INTRAMUSCULAR | Status: DC
  Administered 2022-01-05 – 2023-04-27 (×17): 400 mg via INTRAMUSCULAR

## 2022-01-05 NOTE — Progress Notes (Signed)
Patient in today for due Abilify Maintena 400 mg IM every 28 day injection.  Patient presented with flat affect, level mood and denied any auditory or visual hallucinations, no suicidal or homicidal ideations and no plans or intent to want to harm self or others at this time.  Patient stated he has been doing well with medication.  Reported some issues with sleep as his 46 year old at times wants to sleep in the same bed with him and affects his ability to sleep soundly.  Patient reported no other issues or concerns at this time.  Patient's due Abilify Maintena 400 mg IM injection prepared as ordered and given to patient in his right deltoid area.  Patient reported an initial stinging from the injection but then no issues within 20-30 seconds as tolerated due injection without complaint of pain or discomfort.  Patient agreed to return in 4 weeks for next due injection and to call if any issues prior to then.

## 2022-01-11 ENCOUNTER — Ambulatory Visit (INDEPENDENT_AMBULATORY_CARE_PROVIDER_SITE_OTHER): Payer: Federal, State, Local not specified - PPO

## 2022-01-11 DIAGNOSIS — E538 Deficiency of other specified B group vitamins: Secondary | ICD-10-CM

## 2022-01-11 NOTE — Progress Notes (Signed)
After obtaining consent, and per orders of Dr. Ronnald Ramp, injection of B12 given by Max Sane. Patient tolerated injection well in left deltoid, and instructed to report any adverse reaction to me immediately.

## 2022-01-25 ENCOUNTER — Ambulatory Visit (INDEPENDENT_AMBULATORY_CARE_PROVIDER_SITE_OTHER): Payer: Federal, State, Local not specified - PPO | Admitting: Licensed Clinical Social Worker

## 2022-01-25 DIAGNOSIS — F250 Schizoaffective disorder, bipolar type: Secondary | ICD-10-CM | POA: Diagnosis not present

## 2022-01-27 ENCOUNTER — Encounter (HOSPITAL_COMMUNITY): Payer: Self-pay | Admitting: Licensed Clinical Social Worker

## 2022-01-27 NOTE — Progress Notes (Signed)
Virtual Visit via Telephone Note  I connected with Curtis Townsend on 01/27/22 at 11:00 AM EDT by telephone and verified that I am speaking with the correct person using two identifiers.  Location: Patient: home Provider: home office   I discussed the limitations, risks, security and privacy concerns of performing an evaluation and management service by telephone and the availability of in person appointments. I also discussed with the patient that there may be a patient responsible charge related to this service. The patient expressed understanding and agreed to proceed.    I discussed the assessment and treatment plan with the patient. The patient was provided an opportunity to ask questions and all were answered. The patient agreed with the plan and demonstrated an understanding of the instructions.   The patient was advised to call back or seek an in-person evaluation if the symptoms worsen or if the condition fails to improve as anticipated.  I provided 30 minutes of non-face-to-face time during this encounter.   Curtis Curling, LCSW   THERAPIST PROGRESS NOTE  Session Time: 11:00am-11:30am  Participation Level: Active  Behavioral Response: NAAlertEuthymic  Type of Therapy: Individual Therapy  Treatment Goals addressed: "to make sure my mood is stable and to help me cope with having to be in my house all the time". Curtis Townsend will demonstrate and report increased mood stabilization and coping skills 5 out of 7 days".  ProgressTowards Goals: Progressing  Interventions: CBT  Summary: Curtis Townsend is a 46 y.o. male who presents with Schizoaffective Disorder, mixed type  Suicidal/Homicidal: Nowithout intent/plan  Therapist Response: Curtis Townsend engaged well in telephonic session with clinician. Clinician utilized CBT to process thoughts, feelings, and interactions with family members. Curtis Townsend identified some worry about wife being gone on a work trip for a week next  month. Clinician explored planning and delegation of tasks to his children and mother while wife was gone. Clinician also discussed the importance of communicating expectations to children regarding behaviors, activities, etc. Clinician explored communication with other family members and noted that things seem to be stable. He shared that mood has been good and stable for a while.   Plan: Return again in 4 weeks.  Diagnosis: Schizoaffective disorder, mixed type ([place])  Collaboration of Care: Other none required  Patient/Guardian was advised Release of Information must be obtained prior to any record release in order to collaborate their care with an outside provider. Patient/Guardian was advised if they have not already done so to contact the registration department to sign all necessary forms in order for [name] to release information regarding their care.   Consent: Patient/Guardian gives verbal consent for treatment and assignment of benefits for services provided during this visit. Patient/Guardian expressed understanding and agreed to proceed.   Curtis Marker Norris, LCSW 01/27/2022

## 2022-02-02 ENCOUNTER — Ambulatory Visit (HOSPITAL_BASED_OUTPATIENT_CLINIC_OR_DEPARTMENT_OTHER): Payer: Federal, State, Local not specified - PPO | Admitting: *Deleted

## 2022-02-02 ENCOUNTER — Other Ambulatory Visit (HOSPITAL_COMMUNITY): Payer: Self-pay | Admitting: Psychiatry

## 2022-02-02 ENCOUNTER — Encounter (HOSPITAL_COMMUNITY): Payer: Self-pay | Admitting: *Deleted

## 2022-02-02 VITALS — BP 114/76 | HR 93 | Resp 20 | Ht 73.0 in | Wt 213.4 lb

## 2022-02-02 DIAGNOSIS — F319 Bipolar disorder, unspecified: Secondary | ICD-10-CM

## 2022-02-02 NOTE — Patient Instructions (Addendum)
Pt presents today for due Abilify Maintena 400 mg injection. Pt affect flat, eye contact fair. Guarded but cooperative. Pt denies any AVH, sleep disturbance, or mood disturbance. Slight tremor initially in right hand however pt states the medication is helping with that and it's gotten much better. Injection prepared as ordered and given in Left Deltoid without complaint. Pt did state that "I never get used to this". Pt to return in approximately 28 days for next due injection. Pt agrees to call with any questions or concerns.

## 2022-02-06 ENCOUNTER — Telehealth (HOSPITAL_BASED_OUTPATIENT_CLINIC_OR_DEPARTMENT_OTHER): Payer: Federal, State, Local not specified - PPO | Admitting: Psychiatry

## 2022-02-06 ENCOUNTER — Encounter (HOSPITAL_COMMUNITY): Payer: Self-pay | Admitting: Psychiatry

## 2022-02-06 VITALS — Wt 213.0 lb

## 2022-02-06 DIAGNOSIS — F319 Bipolar disorder, unspecified: Secondary | ICD-10-CM | POA: Diagnosis not present

## 2022-02-06 DIAGNOSIS — F4310 Post-traumatic stress disorder, unspecified: Secondary | ICD-10-CM | POA: Diagnosis not present

## 2022-02-06 MED ORDER — BENZTROPINE MESYLATE 0.5 MG PO TABS: 0.5000 mg | ORAL_TABLET | Freq: Every day | ORAL | 2 refills | Status: DC

## 2022-02-06 MED ORDER — ABILIFY MAINTENA 400 MG IM PRSY: 400.0000 mg | PREFILLED_SYRINGE | INTRAMUSCULAR | 2 refills | Status: DC
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
## 2022-02-13 ENCOUNTER — Ambulatory Visit (INDEPENDENT_AMBULATORY_CARE_PROVIDER_SITE_OTHER): Payer: Federal, State, Local not specified - PPO

## 2022-02-13 DIAGNOSIS — E538 Deficiency of other specified B group vitamins: Secondary | ICD-10-CM | POA: Diagnosis not present

## 2022-02-13 MED ORDER — CYANOCOBALAMIN 1000 MCG/ML IJ SOLN
1000.0000 ug | Freq: Once | INTRAMUSCULAR | Status: AC
  Administered 2022-02-13: 1000 ug via INTRAMUSCULAR

## 2022-02-13 NOTE — Progress Notes (Signed)
After obtaining consent, and per orders of Dr. Jones, injection of B12 given by Reana Chacko. Patient tolerated injection well in right deltoid and instructed to report any adverse reaction to me immediately.  

## 2022-02-22 ENCOUNTER — Ambulatory Visit (INDEPENDENT_AMBULATORY_CARE_PROVIDER_SITE_OTHER): Payer: Federal, State, Local not specified - PPO | Admitting: Licensed Clinical Social Worker

## 2022-02-22 DIAGNOSIS — F250 Schizoaffective disorder, bipolar type: Secondary | ICD-10-CM | POA: Diagnosis not present

## 2022-02-23 ENCOUNTER — Encounter (HOSPITAL_COMMUNITY): Payer: Self-pay | Admitting: Licensed Clinical Social Worker

## 2022-02-23 NOTE — Progress Notes (Signed)
Virtual Visit via Telephone Note  I connected with Curtis Townsend on 02/23/22 at 11:00 AM EDT by telephone and verified that I am speaking with the correct person using two identifiers.  Location: Patient: home Provider: home office   I discussed the limitations, risks, security and privacy concerns of performing an evaluation and management service by telephone and the availability of in person appointments. I also discussed with the patient that there may be a patient responsible charge related to this service. The patient expressed understanding and agreed to proceed.     I discussed the assessment and treatment plan with the patient. The patient was provided an opportunity to ask questions and all were answered. The patient agreed with the plan and demonstrated an understanding of the instructions.   The patient was advised to call back or seek an in-person evaluation if the symptoms worsen or if the condition fails to improve as anticipated.  I provided 30 minutes of non-face-to-face time during this encounter.   [name], LCSW   THERAPIST PROGRESS NOTE  Session Time: 11:00am-11:30am  Participation Level: Active  Behavioral Response: NAAlertEuthymic  Type of Therapy: Individual Therapy  Treatment Goals addressed: "to make sure my mood is stable and to help me cope with having to be in my house all the time". Curtis Townsend will demonstrate and report increased mood stabilization and coping skills 5 out of 7 days".  ProgressTowards Goals: Progressing  Interventions: Motivational Interviewing  Summary: Curtis Townsend is a 46 y.o. male who presents with Schizoaffective Disorder, mixed type.   Suicidal/Homicidal: Nowithout intent/plan  Therapist Response: Curtis Townsend engaged well in individual telephone session. Clinician utilized MI OARS to reflect and summarize thoughts and feelings about wife being on a 10 day work trip. Clinician explored Curtis Townsend's comfort in  managing the household without her home. Clinician also explored mood, stability, and overall sense of peace. Clinician processed thoughts and feelings about the kids' upcoming school year. Curtis Townsend shared concern about his youngest, who has autism and had some issues in school last year. Clinician processed plans and supports in place for his son. Clinician also identified the importance of his own mental health in order to address his son's issues.   Plan: Return again in 4 weeks.  Diagnosis: Schizoaffective disorder, mixed type ([place])  Collaboration of Care: Other none required  Patient/Guardian was advised Release of Information must be obtained prior to any record release in order to collaborate their care with an outside provider. Patient/Guardian was advised if they have not already done so to contact the registration department to sign all necessary forms in order for [name] to release information regarding their care.   Consent: Patient/Guardian gives verbal consent for treatment and assignment of benefits for services provided during this visit. Patient/Guardian expressed understanding and agreed to proceed.   Curtis Marker Brewster, LCSW 02/23/2022

## 2022-03-02 ENCOUNTER — Encounter (HOSPITAL_COMMUNITY): Payer: Self-pay | Admitting: *Deleted

## 2022-03-02 ENCOUNTER — Ambulatory Visit (HOSPITAL_BASED_OUTPATIENT_CLINIC_OR_DEPARTMENT_OTHER): Payer: Federal, State, Local not specified - PPO | Admitting: *Deleted

## 2022-03-02 VITALS — BP 116/70 | HR 84 | Resp 18 | Ht 73.0 in | Wt 213.0 lb

## 2022-03-02 DIAGNOSIS — F319 Bipolar disorder, unspecified: Secondary | ICD-10-CM

## 2022-03-02 NOTE — Patient Instructions (Signed)
Pt presents today for due Abilify Maintena 400 mg injection. Pt affect flat and eye contact poor per usual presentation. Pt denies any changes in mood, avh, or si. Pt generally responds with one word answer. Injection prepared as ordered and given in right deltoid without complaint. Pt to return in approximately 28 days for next due injection. Pt encouraged to call office with any questions or concerns prior to next visit. Pt agrees .

## 2022-03-16 ENCOUNTER — Ambulatory Visit (INDEPENDENT_AMBULATORY_CARE_PROVIDER_SITE_OTHER): Payer: Federal, State, Local not specified - PPO | Admitting: *Deleted

## 2022-03-16 DIAGNOSIS — E538 Deficiency of other specified B group vitamins: Secondary | ICD-10-CM | POA: Diagnosis not present

## 2022-03-16 MED ORDER — CYANOCOBALAMIN 1000 MCG/ML IJ SOLN
1000.0000 ug | Freq: Once | INTRAMUSCULAR | Status: AC
  Administered 2022-03-16: 1000 ug via INTRAMUSCULAR

## 2022-03-16 NOTE — Progress Notes (Unsigned)
Pls cosign for B12 inj../lmb  

## 2022-03-22 ENCOUNTER — Ambulatory Visit (INDEPENDENT_AMBULATORY_CARE_PROVIDER_SITE_OTHER): Payer: Federal, State, Local not specified - PPO | Admitting: Licensed Clinical Social Worker

## 2022-03-22 DIAGNOSIS — F250 Schizoaffective disorder, bipolar type: Secondary | ICD-10-CM | POA: Diagnosis not present

## 2022-03-28 ENCOUNTER — Encounter (HOSPITAL_COMMUNITY): Payer: Self-pay | Admitting: Licensed Clinical Social Worker

## 2022-03-28 NOTE — Progress Notes (Signed)
Virtual Visit via Telephone Note  I connected with Helyn Numbers on 03/28/22 at 11:00 AM EDT by telephone and verified that I am speaking with the correct person using two identifiers.  Location: Patient: home Provider: home office   I discussed the limitations, risks, security and privacy concerns of performing an evaluation and management service by telephone and the availability of in person appointments. I also discussed with the patient that there may be a patient responsible charge related to this service. The patient expressed understanding and agreed to proceed.     I discussed the assessment and treatment plan with the patient. The patient was provided an opportunity to ask questions and all were answered. The patient agreed with the plan and demonstrated an understanding of the instructions.   The patient was advised to call back or seek an in-person evaluation if the symptoms worsen or if the condition fails to improve as anticipated.  I provided 40 minutes of non-face-to-face time during this encounter.   [name], LCSW   THERAPIST PROGRESS NOTE  Session Time: 11:00am-11:40am  Participation Level: Active  Behavioral Response: NAAlertEuthymic  Type of Therapy: Individual Therapy  Treatment Goals addressed: "to make sure my mood is stable and to help me cope with having to be in my house all the time". Mattis will demonstrate and report increased mood stabilization and coping skills 5 out of 7 days".  ProgressTowards Goals: Progressing  Interventions: Motivational Interviewing  Summary: BEAUMONT AUSTAD is a 46 y.o. male who presents with Schizoaffective Disorder, mixed type.   Suicidal/Homicidal: Nowithout intent/plan  Therapist Response: Yosef engaged well in individual telephone session with clinician. Clinician utilized MI OARS to reflect and summarize thoughts and feelings about events over the past month. Clinician identified overall  improvement in Zethan's ability to maintain stable mood. Clinician reflected the usefulness of coping skills such as daily walks, time with wife, and being able to spend much of his day doing chores around the house. Clinician explored updates in family interactions and children's coping with school. Clinician discussed Loman's role as father and how he addresses concerns with the kids.   Plan: Return again in 4 weeks.  Diagnosis: Schizoaffective disorder, mixed type ([place])  Collaboration of Care: Psychiatrist AEB provided updates to [name]  Patient/Guardian was advised Release of Information must be obtained prior to any record release in order to collaborate their care with an outside provider. Patient/Guardian was advised if they have not already done so to contact the registration department to sign all necessary forms in order for [name] to release information regarding their care.   Consent: Patient/Guardian gives verbal consent for treatment and assignment of benefits for services provided during this visit. Patient/Guardian expressed understanding and agreed to proceed.   Jobe Marker [place], LCSW 03/28/2022

## 2022-04-04 ENCOUNTER — Encounter (HOSPITAL_COMMUNITY): Payer: Self-pay | Admitting: *Deleted

## 2022-04-04 ENCOUNTER — Ambulatory Visit (HOSPITAL_BASED_OUTPATIENT_CLINIC_OR_DEPARTMENT_OTHER): Payer: Federal, State, Local not specified - PPO | Admitting: *Deleted

## 2022-04-04 VITALS — BP 112/73 | HR 76 | Resp 18 | Ht 73.0 in | Wt 212.6 lb

## 2022-04-04 DIAGNOSIS — F319 Bipolar disorder, unspecified: Secondary | ICD-10-CM | POA: Diagnosis not present

## 2022-04-04 NOTE — Patient Instructions (Signed)
Pt presents today for due Abilify Maintena 400 mg injection. Pt affect flat, eye contact minimal, but is appropriate and cooperative on approach. Coarse tremors noted in R hand and pt fidgety in general. Pt denies that the movements bother him. Medications reviewed. Injection prepared as ordered and given in LD with minimal complaints. Pt to return in approximately 28 days for next due injection. Pt agrees to call before next due injection with any questions or concerns.

## 2022-04-18 ENCOUNTER — Ambulatory Visit (INDEPENDENT_AMBULATORY_CARE_PROVIDER_SITE_OTHER): Payer: Federal, State, Local not specified - PPO | Admitting: *Deleted

## 2022-04-18 DIAGNOSIS — E538 Deficiency of other specified B group vitamins: Secondary | ICD-10-CM

## 2022-04-18 MED ORDER — CYANOCOBALAMIN 1000 MCG/ML IJ SOLN
1000.0000 ug | Freq: Once | INTRAMUSCULAR | Status: AC
  Administered 2022-04-18: 1000 ug via INTRAMUSCULAR

## 2022-04-18 NOTE — Progress Notes (Addendum)
Administered B12 shots right deltoid. Pt tolerated well.

## 2022-04-19 ENCOUNTER — Ambulatory Visit (HOSPITAL_COMMUNITY): Payer: Federal, State, Local not specified - PPO | Admitting: Licensed Clinical Social Worker

## 2022-05-03 ENCOUNTER — Other Ambulatory Visit: Payer: Self-pay | Admitting: Internal Medicine

## 2022-05-03 DIAGNOSIS — E785 Hyperlipidemia, unspecified: Secondary | ICD-10-CM

## 2022-05-04 ENCOUNTER — Ambulatory Visit (HOSPITAL_COMMUNITY): Payer: Federal, State, Local not specified - PPO | Admitting: *Deleted

## 2022-05-08 ENCOUNTER — Telehealth (HOSPITAL_BASED_OUTPATIENT_CLINIC_OR_DEPARTMENT_OTHER): Payer: Federal, State, Local not specified - PPO | Admitting: Psychiatry

## 2022-05-08 ENCOUNTER — Other Ambulatory Visit (HOSPITAL_COMMUNITY): Payer: Self-pay | Admitting: Psychiatry

## 2022-05-08 ENCOUNTER — Encounter (HOSPITAL_COMMUNITY): Payer: Self-pay | Admitting: Psychiatry

## 2022-05-08 DIAGNOSIS — F319 Bipolar disorder, unspecified: Secondary | ICD-10-CM

## 2022-05-08 DIAGNOSIS — F4310 Post-traumatic stress disorder, unspecified: Secondary | ICD-10-CM

## 2022-05-08 DIAGNOSIS — F3181 Bipolar II disorder: Secondary | ICD-10-CM | POA: Diagnosis not present

## 2022-05-08 MED ORDER — BENZTROPINE MESYLATE 0.5 MG PO TABS: 0.5000 mg | ORAL_TABLET | Freq: Every day | ORAL | 2 refills | Status: DC

## 2022-05-08 MED ORDER — ABILIFY MAINTENA 400 MG IM PRSY: 400.0000 mg | PREFILLED_SYRINGE | INTRAMUSCULAR | 2 refills | Status: DC

## 2022-05-08 NOTE — Progress Notes (Signed)
Virtual Visit via Telephone Note  I connected with Curtis Townsend on 05/08/22 at  2:00 PM EDT by telephone and verified that I am speaking with the correct person using two identifiers.  Location: Patient: Home Provider: Home Office   I discussed the limitations, risks, security and privacy concerns of performing an evaluation and management service by telephone and the availability of in person appointments. I also discussed with the patient that there may be a patient responsible charge related to this service. The patient expressed understanding and agreed to proceed.   History of Present Illness: Patient is evaluated by phone session.  Today he is sad because her wife's best friend died in [place] because of cancer.  Patient is getting Abilify injection regularly and he described his mood stable.  He denies any mania, psychosis or any agitation.  He has chronic PTSD but symptoms are stable.  He is in therapy with [name].  He gets every month Abilify injection.  He is pleased that his 46 year old daughter is doing well and seeing a therapist.  Patient lives with his 3 daughters and his mother.  His appetite is okay.  His energy level is okay.  He has no tremors, shakes or any EPS.  He takes Cogentin that helps.  He wants to keep his current medication.  Past Psychiatric History:  H/O abuse, anger, paranoia, ADHD and poor impulse control. H/O suicidal attempt as tried to hang himself and sister saved him.  H/O multiple inpatient treatment and last inpatient in September 2019. H/O THC use. Took Prozac, Risperdal, Risperdal, Depakote, hydroxyzine, Depakote and Trazodone but discontinued due to noncompliance.    Psychiatric Specialty Exam: Physical Exam  Review of Systems  Weight 213 lb (96.6 kg).There is no height or weight on file to calculate BMI.  General Appearance: NA  Eye Contact:  NA  Speech:  Clear and Coherent  Volume:  Normal  Mood:  Euthymic  Affect:  NA  Thought Process:   Descriptions of Associations: Intact  Orientation:  Full (Time, Place, and Person)  Thought Content:  Rumination  Suicidal Thoughts:  No  Homicidal Thoughts:  No  Memory:  Immediate;   Good Recent;   Good Remote;   Good  Judgement:  Intact  Insight:  Good  Psychomotor Activity:  NA  Concentration:  Concentration: Good and Attention Span: Fair  Recall:  Good  Fund of Knowledge:  Good  Language:  Good  Akathisia:  No  Handed:  Right  AIMS (if indicated):     Assets:  Communication Skills Desire for Improvement Housing Resilience Social Support Transportation  ADL's:  Intact  Cognition:  WNL  Sleep:   ok     Assessment and Plan: PTSD.  Bipolar disorder type II.  Discussed current medication.  Patient is stable on his Abilify at 400 mg intramuscular every 28 days and Cogentin 0.5 mg to help his tremors.  He does not want to change the medication.  I encouraged to continue therapy with [name].  I also reminded to see his PCP for blood work and routine physical.  Recommended to call us back if he has any question or any concern.  Follow-up in 3 months.  Patient is scheduled to have injection tomorrow in the office.  Follow Up Instructions:    I discussed the assessment and treatment plan with the patient. The patient was provided an opportunity to ask questions and all were answered. The patient agreed with the plan and demonstrated an understanding  of the instructions.   The patient was advised to call back or seek an in-person evaluation if the symptoms worsen or if the condition fails to improve as anticipated.  I provided 16 minutes of non-face-to-face time during this encounter.   [name], MD

## 2022-05-09 ENCOUNTER — Encounter (HOSPITAL_COMMUNITY): Payer: Self-pay | Admitting: *Deleted

## 2022-05-09 ENCOUNTER — Ambulatory Visit (HOSPITAL_BASED_OUTPATIENT_CLINIC_OR_DEPARTMENT_OTHER): Payer: Federal, State, Local not specified - PPO | Admitting: *Deleted

## 2022-05-09 VITALS — BP 124/75 | HR 85 | Resp 18 | Ht 73.0 in | Wt 216.8 lb

## 2022-05-09 DIAGNOSIS — F319 Bipolar disorder, unspecified: Secondary | ICD-10-CM

## 2022-05-09 NOTE — Patient Instructions (Signed)
Pt presents today for due Abilify Maintena 400 mg injection. Pt guarded on approach, no eye contact, pt had on dark glasses and hood on throughout encounter. Hygiene appears poor today with dirty clothes, disheveled and body odor. This is not unusual presentation for pt. Pt responds to questions with minimal response. Denies AVH, SI, or HI. Injection prepared as ordered and given in RD without complaint. Pt to return in approximately 28 days for next due injection. Pt encouraged to call with any questions or concerns. Pt verbalizes understanding.

## 2022-05-22 DIAGNOSIS — H401124 Primary open-angle glaucoma, left eye, indeterminate stage: Secondary | ICD-10-CM | POA: Diagnosis not present

## 2022-05-22 DIAGNOSIS — H401114 Primary open-angle glaucoma, right eye, indeterminate stage: Secondary | ICD-10-CM | POA: Diagnosis not present

## 2022-05-22 DIAGNOSIS — H2513 Age-related nuclear cataract, bilateral: Secondary | ICD-10-CM | POA: Diagnosis not present

## 2022-05-24 ENCOUNTER — Ambulatory Visit (HOSPITAL_COMMUNITY): Payer: Federal, State, Local not specified - PPO | Admitting: Licensed Clinical Social Worker

## 2022-05-25 ENCOUNTER — Ambulatory Visit: Payer: Federal, State, Local not specified - PPO

## 2022-05-26 ENCOUNTER — Ambulatory Visit: Payer: Federal, State, Local not specified - PPO

## 2022-05-26 DIAGNOSIS — E538 Deficiency of other specified B group vitamins: Secondary | ICD-10-CM

## 2022-05-26 NOTE — Progress Notes (Signed)
B12 given.  Pt tolerated well. Pt is aware to give the office a call for an side effects or reactions. Please co-sign.   

## 2022-06-08 ENCOUNTER — Ambulatory Visit (HOSPITAL_BASED_OUTPATIENT_CLINIC_OR_DEPARTMENT_OTHER): Payer: Federal, State, Local not specified - PPO | Admitting: *Deleted

## 2022-06-08 ENCOUNTER — Encounter (HOSPITAL_COMMUNITY): Payer: Self-pay | Admitting: *Deleted

## 2022-06-08 VITALS — BP 109/71 | HR 90 | Ht 73.0 in | Wt 217.0 lb

## 2022-06-08 DIAGNOSIS — F319 Bipolar disorder, unspecified: Secondary | ICD-10-CM | POA: Diagnosis not present

## 2022-06-08 NOTE — Patient Instructions (Signed)
Pt presents today for due Abilify Maintena 400 mg. Pt less guarded today while eye contact is still very minimal. Cooperative on approach. Pt denies any mood swings, SI, HI, and AVH. Pt has concerned about his youngest child and school issues. Otherwise he says he's doing well. VSS. Injection prepared as ordered and given in LD without complaint. Pt to return in approximately 28 days for next due injection.

## 2022-06-14 ENCOUNTER — Ambulatory Visit (INDEPENDENT_AMBULATORY_CARE_PROVIDER_SITE_OTHER): Payer: Federal, State, Local not specified - PPO | Admitting: Licensed Clinical Social Worker

## 2022-06-14 DIAGNOSIS — F250 Schizoaffective disorder, bipolar type: Secondary | ICD-10-CM

## 2022-06-15 ENCOUNTER — Encounter (HOSPITAL_COMMUNITY): Payer: Self-pay | Admitting: Licensed Clinical Social Worker

## 2022-06-15 NOTE — Progress Notes (Signed)
Virtual Visit via Telephone Note  I connected with Curtis Townsend on 06/15/22 at 11:00 AM EST by telephone and verified that I am speaking with the correct person using two identifiers.  Location: Patient: home Provider: home office   I discussed the limitations, risks, security and privacy concerns of performing an evaluation and management service by telephone and the availability of in person appointments. I also discussed with the patient that there may be a patient responsible charge related to this service. The patient expressed understanding and agreed to proceed.    I discussed the assessment and treatment plan with the patient. The patient was provided an opportunity to ask questions and all were answered. The patient agreed with the plan and demonstrated an understanding of the instructions.   The patient was advised to call back or seek an in-person evaluation if the symptoms worsen or if the condition fails to improve as anticipated.  I provided 40 minutes of non-face-to-face time during this encounter.   [name], LCSW   THERAPIST PROGRESS NOTE  Session Time: 11:00am-11:40am  Participation Level: Active  Behavioral Response: NAAlertEuthymic  Type of Therapy: Individual Therapy  Treatment Goals addressed: "to make sure my mood is stable and to help me cope with having to be in my house all the time". Curtis Townsend will demonstrate and report increased mood stabilization and coping skills 5 out of 7 days".   ProgressTowards Goals: Progressing  Interventions: CBT  Summary: Curtis Townsend is a 46 y.o. male who presents with Schizoaffective Disorder, mixed type.   Suicidal/Homicidal: Nowithout intent/plan  Therapist Response: Curtis Townsend engaged well in individual virtual session. Clinician utilized CBT to process updates with mood, family interactions, and concerns about the future. Clinician processed coping skills for dealing with children growing up and  exploring their identities. Clinician provided feedback about being supportive and understanding about these changes. Clinician discussed concerns about son with Autism in school. Curtis Townsend shared that he is "on call" in case the school calls. Clinician provided feedback about possible ABA therapy for him. Curtis Townsend identified that mood has been stable and he has not had any big changes or problems.   Plan: Return again in 4 weeks.  Diagnosis: Schizoaffective disorder, mixed type ([place])  Collaboration of Care: Psychiatrist AEB reviewed notes from previous psyc appointments  Patient/Guardian was advised Release of Information must be obtained prior to any record release in order to collaborate their care with an outside provider. Patient/Guardian was advised if they have not already done so to contact the registration department to sign all necessary forms in order for [name] to release information regarding their care.   Consent: Patient/Guardian gives verbal consent for treatment and assignment of benefits for services provided during this visit. Patient/Guardian expressed understanding and agreed to proceed.   Jobe Marker [employer], LCSW 06/15/2022

## 2022-06-26 ENCOUNTER — Ambulatory Visit (INDEPENDENT_AMBULATORY_CARE_PROVIDER_SITE_OTHER): Payer: Federal, State, Local not specified - PPO

## 2022-06-26 DIAGNOSIS — E538 Deficiency of other specified B group vitamins: Secondary | ICD-10-CM

## 2022-06-26 MED ORDER — CYANOCOBALAMIN 1000 MCG/ML IJ SOLN
1000.0000 ug | Freq: Once | INTRAMUSCULAR | Status: AC
  Administered 2022-06-26: 1000 ug via INTRAMUSCULAR

## 2022-06-26 NOTE — Progress Notes (Signed)
After obtaining consent, and per orders of Dr. Ronnald Ramp, injection of B12 was given in the right deltoid by Marrian Salvage. Patient instructed to report any adverse reaction to me immediately.

## 2022-07-06 ENCOUNTER — Encounter (HOSPITAL_COMMUNITY): Payer: Self-pay | Admitting: *Deleted

## 2022-07-06 ENCOUNTER — Ambulatory Visit (HOSPITAL_BASED_OUTPATIENT_CLINIC_OR_DEPARTMENT_OTHER): Payer: Federal, State, Local not specified - PPO | Admitting: *Deleted

## 2022-07-06 VITALS — BP 114/75 | HR 91 | Resp 18 | Ht 73.0 in | Wt 219.0 lb

## 2022-07-06 DIAGNOSIS — F319 Bipolar disorder, unspecified: Secondary | ICD-10-CM | POA: Diagnosis not present

## 2022-07-06 NOTE — Patient Instructions (Signed)
Pt presents today for due Abilify Maintena 400 mg injection. Pt guarded with very minimal eye contact. Pt denies any issues since last injection. Denies si, hi, avh. Injection prepared and given as ordered in RD without complaint. Pt to return in approximately 28 days for next due injection. Pt will call with any questions or concerns.

## 2022-07-13 ENCOUNTER — Ambulatory Visit (HOSPITAL_COMMUNITY): Payer: Federal, State, Local not specified - PPO | Admitting: Licensed Clinical Social Worker

## 2022-07-27 ENCOUNTER — Ambulatory Visit: Payer: Federal, State, Local not specified - PPO

## 2022-07-28 ENCOUNTER — Ambulatory Visit (INDEPENDENT_AMBULATORY_CARE_PROVIDER_SITE_OTHER): Payer: Federal, State, Local not specified - PPO

## 2022-07-28 DIAGNOSIS — E538 Deficiency of other specified B group vitamins: Secondary | ICD-10-CM | POA: Diagnosis not present

## 2022-07-28 MED ORDER — CYANOCOBALAMIN 1000 MCG/ML IJ SOLN
1000.0000 ug | Freq: Once | INTRAMUSCULAR | Status: AC
  Administered 2022-07-28: 1000 ug via INTRAMUSCULAR

## 2022-07-28 NOTE — Progress Notes (Signed)
Patient received B12 shot with no complications.

## 2022-08-03 ENCOUNTER — Ambulatory Visit (HOSPITAL_BASED_OUTPATIENT_CLINIC_OR_DEPARTMENT_OTHER): Payer: Federal, State, Local not specified - PPO | Admitting: *Deleted

## 2022-08-03 VITALS — BP 114/75 | HR 78 | Resp 18 | Ht 73.0 in | Wt 216.2 lb

## 2022-08-03 DIAGNOSIS — F319 Bipolar disorder, unspecified: Secondary | ICD-10-CM | POA: Diagnosis not present

## 2022-08-03 NOTE — Patient Instructions (Signed)
Pt presents today for due Abilify Maintena 400 mg injection. Pt flat, guarded on approach with no eye contact per usual presentation. Pt is cooperative. Denies any issues or c/o since last injection. Denies SI. Injection prepared and given as ordered in LD without complaint. Pt to return in approximately 28 days for next due injection. Pt will call with any questions or concerns.

## 2022-08-07 ENCOUNTER — Encounter (HOSPITAL_COMMUNITY): Payer: Self-pay | Admitting: Psychiatry

## 2022-08-07 ENCOUNTER — Telehealth (HOSPITAL_BASED_OUTPATIENT_CLINIC_OR_DEPARTMENT_OTHER): Payer: Federal, State, Local not specified - PPO | Admitting: Psychiatry

## 2022-08-07 VITALS — Wt 216.0 lb

## 2022-08-07 DIAGNOSIS — F319 Bipolar disorder, unspecified: Secondary | ICD-10-CM | POA: Diagnosis not present

## 2022-08-07 DIAGNOSIS — F4310 Post-traumatic stress disorder, unspecified: Secondary | ICD-10-CM

## 2022-08-07 DIAGNOSIS — R251 Tremor, unspecified: Secondary | ICD-10-CM | POA: Diagnosis not present

## 2022-08-07 MED ORDER — BENZTROPINE MESYLATE 0.5 MG PO TABS: 0.5000 mg | ORAL_TABLET | Freq: Every day | ORAL | 2 refills | Status: DC

## 2022-08-07 MED ORDER — ABILIFY MAINTENA 400 MG IM PRSY: 400.0000 mg | PREFILLED_SYRINGE | INTRAMUSCULAR | 2 refills | Status: DC

## 2022-08-07 NOTE — Progress Notes (Signed)
Virtual Visit via Telephone Note  I connected with Curtis Townsend on 08/07/22 at  2:20 PM EST by telephone and verified that I am speaking with the correct person using two identifiers.  Location: Patient: Home Provider: Home Office   I discussed the limitations, risks, security and privacy concerns of performing an evaluation and management service by telephone and the availability of in person appointments. I also discussed with the patient that there may be a patient responsible charge related to this service. The patient expressed understanding and agreed to proceed.   History of Present Illness: Patient is evaluated by phone session.  He is getting Abilify injection that seems to be working his mood.  He denies any panic attack, crying spells or any feeling of hopelessness or worthlessness.  He is in therapy with [name] once a month.  He has no nightmares or flashback.  Patient told his wife recently had a flu but she is recovering.  Her daughter is getting therapy.  He has tried to keep himself busy taking care of that 3 daughters who are in the school.  He has no tremors, shakes or any EPS.  His energy level is good.  Denies drinking or using any illegal substances.   Past Psychiatric History:  H/O abuse, anger, paranoia, ADHD and poor impulse control. H/O suicidal attempt as tried to hang himself and sister saved him.  H/O multiple inpatient treatment and last inpatient in September 2019. H/O THC use. Took Prozac, Risperdal, Risperdal, Depakote, hydroxyzine, Depakote and Trazodone but discontinued due to noncompliance.     Psychiatric Specialty Exam: Physical Exam  Review of Systems  Weight 216 lb (98 kg).There is no height or weight on file to calculate BMI.  General Appearance: NA  Eye Contact:  NA  Speech:  Clear and Coherent and Normal Rate  Volume:  Normal  Mood:  Euthymic  Affect:  NA  Thought Process:  Goal Directed  Orientation:  Full (Time, Place, and Person)   Thought Content:  WDL  Suicidal Thoughts:  No  Homicidal Thoughts:  No  Memory:  Immediate;   Good Recent;   Good Remote;   Good  Judgement:  Intact  Insight:  Present  Psychomotor Activity:  NA  Concentration:  Concentration: Fair and Attention Span: Fair  Recall:  Good  Fund of Knowledge:  Good  Language:  Good  Akathisia:  No  Handed:  Right  AIMS (if indicated):     Assets:  Communication Skills Desire for Improvement Housing Resilience Social Support Transportation  ADL's:  Intact  Cognition:  WNL  Sleep:   ok      Assessment and Plan: PTSD.  Bipolar disorder type II.  Patient is stable on Abilify 400 mg intramuscular every 28 days and Cogentin 0.5 mg for tremors.  Recommended to call us back if he has any question or any concern.  Follow-up in 3 months.  Encouraged to continue therapy with [name].  Follow Up Instructions:    I discussed the assessment and treatment plan with the patient. The patient was provided an opportunity to ask questions and all were answered. The patient agreed with the plan and demonstrated an understanding of the instructions.   The patient was advised to call back or seek an in-person evaluation if the symptoms worsen or if the condition fails to improve as anticipated.  Collaboration of Care: Other provider involved in patient's care AEB notes are in epic to review.   Patient/Guardian was advised Release of  Information must be obtained prior to any record release in order to collaborate their care with an outside provider. Patient/Guardian was advised if they have not already done so to contact the registration department to sign all necessary forms in order for [name] to release information regarding their care.   Consent: Patient/Guardian gives verbal consent for treatment and assignment of benefits for services provided during this visit. Patient/Guardian expressed understanding and agreed to proceed.    I provided 22 minutes of  non-face-to-face time during this encounter.   [name], MD

## 2022-08-10 ENCOUNTER — Ambulatory Visit (HOSPITAL_COMMUNITY): Payer: Federal, State, Local not specified - PPO | Admitting: Licensed Clinical Social Worker

## 2022-08-17 ENCOUNTER — Encounter (HOSPITAL_COMMUNITY): Payer: Self-pay | Admitting: Licensed Clinical Social Worker

## 2022-08-17 ENCOUNTER — Ambulatory Visit (INDEPENDENT_AMBULATORY_CARE_PROVIDER_SITE_OTHER): Payer: Federal, State, Local not specified - PPO | Admitting: Licensed Clinical Social Worker

## 2022-08-17 ENCOUNTER — Encounter (HOSPITAL_COMMUNITY): Payer: Self-pay

## 2022-08-17 DIAGNOSIS — F250 Schizoaffective disorder, bipolar type: Secondary | ICD-10-CM | POA: Diagnosis not present

## 2022-08-17 NOTE — Progress Notes (Signed)
Virtual Visit via Video Note  I connected with Curtis Townsend on 08/17/22 at 11:00 AM EST by a video enabled telemedicine application and verified that I am speaking with the correct person using two identifiers.  Location: Patient: home Provider: home office   I discussed the limitations of evaluation and management by telemedicine and the availability of in person appointments. The patient expressed understanding and agreed to proceed.   I discussed the assessment and treatment plan with the patient. The patient was provided an opportunity to ask questions and all were answered. The patient agreed with the plan and demonstrated an understanding of the instructions.   The patient was advised to call back or seek an in-person evaluation if the symptoms worsen or if the condition fails to improve as anticipated.  I provided 20 minutes of non-face-to-face time during this encounter.   [name], LCSW   THERAPIST PROGRESS NOTE  Session Time: 11:00am-11:20am  Participation Level: Active  Behavioral Response: Well GroomedAlertEuthymic  Type of Therapy: Individual Therapy  Treatment Goals addressed: LTG: Reduce frequency, intensity, and duration of depression symptoms so that daily functioning is improved  LTG: Increase coping skills to manage depression and improve ability to perform daily activities   ProgressTowards Goals: Progressing  Interventions: CBT  Summary: BOND GRIESHOP is a 47 y.o. male who presents with Schizoaffective Disorder, mixed  Suicidal/Homicidal: Nowithout intent/plan  Therapist Response: Askari engaged well in individual virtual session. Clinician explored thoughts, feelings, and behaviors using CBT. Clinician discussed Eathon's mood stability and coping skills. Clinician also addressed Matson's concerns about youngest son. Clinician explroed school bxs and provided feedback. Clinician also encouraged Corneilus to seek out counseling or to  spend more time in counseling with younger son. Clinician identified appropriate routes to care and treatment for Austim and ADHD. Clinician provided psychoeducation about how neurodivergence can impact all areas of life.   Plan: Return again in 4 weeks.  Diagnosis: Schizoaffective disorder, mixed type ([place])  Collaboration of Care: Medication Management AEB reviewed last note from [name]  Patient/Guardian was advised Release of Information must be obtained prior to any record release in order to collaborate their care with an outside provider. Patient/Guardian was advised if they have not already done so to contact the registration department to sign all necessary forms in order for [name] to release information regarding their care.   Consent: Patient/Guardian gives verbal consent for treatment and assignment of benefits for services provided during this visit. Patient/Guardian expressed understanding and agreed to proceed.   Jobe Marker [place], LCSW 08/17/2022

## 2022-08-22 ENCOUNTER — Encounter: Payer: Self-pay | Admitting: Internal Medicine

## 2022-08-22 ENCOUNTER — Ambulatory Visit (INDEPENDENT_AMBULATORY_CARE_PROVIDER_SITE_OTHER): Payer: Federal, State, Local not specified - PPO | Admitting: Internal Medicine

## 2022-08-22 VITALS — BP 118/74 | HR 92 | Temp 98.7°F | Resp 16 | Ht 73.0 in | Wt 214.0 lb

## 2022-08-22 DIAGNOSIS — Z1211 Encounter for screening for malignant neoplasm of colon: Secondary | ICD-10-CM | POA: Insufficient documentation

## 2022-08-22 DIAGNOSIS — E785 Hyperlipidemia, unspecified: Secondary | ICD-10-CM | POA: Diagnosis not present

## 2022-08-22 DIAGNOSIS — E538 Deficiency of other specified B group vitamins: Secondary | ICD-10-CM | POA: Diagnosis not present

## 2022-08-22 DIAGNOSIS — G63 Polyneuropathy in diseases classified elsewhere: Secondary | ICD-10-CM | POA: Diagnosis not present

## 2022-08-22 DIAGNOSIS — E559 Vitamin D deficiency, unspecified: Secondary | ICD-10-CM

## 2022-08-22 LAB — CBC WITH DIFFERENTIAL/PLATELET
Basophils Absolute: 0.1 10*3/uL (ref 0.0–0.1)
Basophils Relative: 1 % (ref 0.0–3.0)
Eosinophils Absolute: 0.1 10*3/uL (ref 0.0–0.7)
Eosinophils Relative: 1.5 % (ref 0.0–5.0)
HCT: 50.7 % (ref 39.0–52.0)
Hemoglobin: 17.1 g/dL — ABNORMAL HIGH (ref 13.0–17.0)
Lymphocytes Relative: 37.9 % (ref 12.0–46.0)
Lymphs Abs: 2.2 10*3/uL (ref 0.7–4.0)
MCHC: 33.7 g/dL (ref 30.0–36.0)
MCV: 93.1 fl (ref 78.0–100.0)
Monocytes Absolute: 0.3 10*3/uL (ref 0.1–1.0)
Monocytes Relative: 4.7 % (ref 3.0–12.0)
Neutro Abs: 3.2 10*3/uL (ref 1.4–7.7)
Neutrophils Relative %: 54.9 % (ref 43.0–77.0)
Platelets: 184 10*3/uL (ref 150.0–400.0)
RBC: 5.44 Mil/uL (ref 4.22–5.81)
RDW: 13.5 % (ref 11.5–15.5)
WBC: 5.9 10*3/uL (ref 4.0–10.5)

## 2022-08-22 LAB — BASIC METABOLIC PANEL
BUN: 9 mg/dL (ref 6–23)
CO2: 30 mEq/L (ref 19–32)
Calcium: 10.1 mg/dL (ref 8.4–10.5)
Chloride: 104 mEq/L (ref 96–112)
Creatinine, Ser: 1.23 mg/dL (ref 0.40–1.50)
GFR: 70.32 mL/min (ref >60.00)
Glucose, Bld: 123 mg/dL — ABNORMAL HIGH (ref 70–99)
Potassium: 4 mEq/L (ref 3.5–5.1)
Sodium: 142 mEq/L (ref 135–145)

## 2022-08-22 LAB — VITAMIN D 25 HYDROXY (VIT D DEFICIENCY, FRACTURES): VITD: 8.17 ng/mL — ABNORMAL LOW (ref 30.00–100.00)

## 2022-08-22 LAB — HEPATIC FUNCTION PANEL
ALT: 20 U/L (ref 0–53)
AST: 17 U/L (ref 0–37)
Albumin: 4.3 g/dL (ref 3.5–5.2)
Alkaline Phosphatase: 90 U/L (ref 39–117)
Bilirubin, Direct: 0.1 mg/dL (ref 0.0–0.3)
Total Bilirubin: 0.7 mg/dL (ref 0.2–1.2)
Total Protein: 7.4 g/dL (ref 6.0–8.3)

## 2022-08-22 LAB — LIPID PANEL
Cholesterol: 280 mg/dL — ABNORMAL HIGH (ref 0–200)
HDL: 50.1 mg/dL (ref >39.00)
LDL Cholesterol: 207 mg/dL — ABNORMAL HIGH (ref 0–99)
NonHDL: 229.44
Total CHOL/HDL Ratio: 6
Triglycerides: 110 mg/dL (ref 0.0–149.0)
VLDL: 22 mg/dL (ref 0.0–40.0)

## 2022-08-22 LAB — TSH: TSH: 0.77 u[IU]/mL (ref 0.35–5.50)

## 2022-08-22 LAB — FOLATE: Folate: 6.4 ng/mL (ref >5.9)

## 2022-08-22 MED ORDER — ROSUVASTATIN CALCIUM 20 MG PO TABS: 20.0000 mg | ORAL_TABLET | Freq: Every day | ORAL | 1 refills | Status: DC

## 2022-08-22 MED ORDER — CHOLECALCIFEROL 1.25 MG (50000 UT) PO CAPS: 50000.0000 [IU] | ORAL_CAPSULE | ORAL | 0 refills | Status: DC

## 2022-08-22 NOTE — Progress Notes (Unsigned)
Subjective:  Patient ID: Curtis Townsend, male    DOB: April 11, 1976  Age: 47 y.o. MRN: [id number]  CC: No chief complaint on file.   HPI RUPERT BAUMHARDT presents for ***  Outpatient Medications Prior to Visit  Medication Sig Dispense Refill   ARIPiprazole ER (ABILIFY MAINTENA) 400 MG PRSY prefilled syringe Inject 400 mg into the muscle every 28 (twenty-eight) days. 1 each 2   benztropine (COGENTIN) 0.5 MG tablet Take 1 tablet (0.5 mg total) by mouth at bedtime. 30 tablet 2   folic acid (FOLVITE) 1 MG tablet TAKE 1 TABLET BY MOUTH EVERY DAY 90 tablet 1   ketoconazole (NIZORAL) 2 % shampoo Apply 1 application. topically 2 (two) times a week. 120 mL 2   rosuvastatin (CRESTOR) 20 MG tablet TAKE 1 TABLET BY MOUTH EVERY DAY 90 tablet 1   Facility-Administered Medications Prior to Visit  Medication Dose Route Frequency Provider Last Rate Last Admin   ARIPiprazole ER (ABILIFY MAINTENA) 400 MG prefilled syringe 400 mg  400 mg Intramuscular Q28 days Arfeen, Dossie Der T, MD   400 mg at 08/03/22 1122   cyanocobalamin ((VITAMIN B-12)) injection 1,000 mcg  1,000 mcg Intramuscular Q30 days Janith Lima, MD   1,000 mcg at 05/26/22 0942    ROS Review of Systems  Constitutional:  Positive for unexpected weight change (wt gain).    Objective:  BP 118/74 (BP Location: Left Arm, Patient Position: Sitting, Cuff Size: Large)   Pulse 92   Temp 98.7 F (37.1 C) (Oral)   Resp 16   Ht 6' 1"$  (1.854 m)   Wt 214 lb (97.1 kg)   SpO2 98%   BMI 28.23 kg/m   BP Readings from Last 3 Encounters:  08/22/22 118/74  10/20/21 112/60  02/09/21 112/78    Wt Readings from Last 3 Encounters:  08/22/22 214 lb (97.1 kg)  10/20/21 214 lb (97.1 kg)  02/09/21 204 lb 9.6 oz (92.8 kg)    Physical Exam  Lab Results  Component Value Date   WBC 5.9 08/22/2022   HGB 17.1 (H) 08/22/2022   HCT 50.7 08/22/2022   PLT 184.0 08/22/2022   GLUCOSE 123 (H) 08/22/2022   CHOL 280 (H) 08/22/2022   TRIG 110.0 08/22/2022    HDL 50.10 08/22/2022   LDLCALC 207 (H) 08/22/2022   ALT 20 08/22/2022   AST 17 08/22/2022   NA 142 08/22/2022   K 4.0 08/22/2022   CL 104 08/22/2022   CREATININE 1.23 08/22/2022   BUN 9 08/22/2022   CO2 30 08/22/2022   TSH 0.77 08/22/2022   PSA 1.82 10/20/2021   HGBA1C 5.5 09/17/2020    CT ABDOMEN PELVIS WO CONTRAST  Result Date: 12/29/2020 CLINICAL DATA:  Right flank pain and hematuria. EXAM: CT ABDOMEN AND PELVIS WITHOUT CONTRAST TECHNIQUE: Multidetector CT imaging of the abdomen and pelvis was performed following the standard protocol without IV contrast. COMPARISON:  11/03/2017 FINDINGS: Lower chest: Minimal streaky dependent subpleural atelectasis but no infiltrates or effusions. The heart is normal in size. No pericardial effusion. The distal esophagus is normal. Hepatobiliary: No hepatic lesions or intrahepatic biliary dilatation. The gallbladder is unremarkable. No common bile duct dilatation. Pancreas: No mass, inflammation or ductal dilatation. Spleen: Normal size.  No focal lesions. Adrenals/Urinary Tract: The adrenal glands are unremarkable. Small lower pole left renal calculus. No right-sided renal calculi. There is mild right hydroureteronephrosis down to an obstructing 3 mm calculus in the upper right ureter located at the L3-4 disc space level. No  left-sided ureteral calculi. No bladder calculi. No worrisome renal or bladder lesions without contrast. Down to an obstructing Stomach/Bowel: The stomach, duodenum, small bowel and colon are grossly normal without oral contrast. No inflammatory changes, mass lesions or obstructive findings. The appendix is normal. Vascular/Lymphatic: The aorta is normal in caliber. No atheroscerlotic calcifications. No mesenteric of retroperitoneal mass or adenopathy. Small scattered lymph nodes are noted. Reproductive: The prostate gland and seminal vesicles are unremarkable. Other: No pelvic mass or adenopathy. No free pelvic fluid collections. No  inguinal mass or adenopathy. No abdominal wall hernia or subcutaneous lesions. Musculoskeletal: No significant bony findings. IMPRESSION: 1. 3 mm upper right ureteral calculus causing mild right-sided hydroureteronephrosis. 2. Small lower pole left renal calculus. 3. No other significant abdominal/pelvic findings, mass lesions or adenopathy. Electronically Signed   By: Marijo Sanes M.D.   On: 12/29/2020 08:17    Assessment & Plan:   Diagnoses and all orders for this visit:  Vitamin B12 deficiency neuropathy ([place]) -     CBC with Differential/Platelet; Future -     Folate; Future -     Basic metabolic panel; Future -     Basic metabolic panel -     Folate -     CBC with Differential/Platelet  Dyslipidemia, goal LDL below 130 -     Lipid panel; Future -     Hepatic function panel; Future -     TSH; Future -     Basic metabolic panel; Future -     Basic metabolic panel -     TSH -     Hepatic function panel -     Lipid panel -     rosuvastatin (CRESTOR) 20 MG tablet; Take 1 tablet (20 mg total) by mouth daily.  Dietary folate deficiency -     CBC with Differential/Platelet; Future -     Folate; Future -     Basic metabolic panel; Future -     Basic metabolic panel -     Folate -     CBC with Differential/Platelet  Vitamin D deficiency -     Basic metabolic panel; Future -     VITAMIN D 25 Hydroxy (Vit-D Deficiency, Fractures); Future -     VITAMIN D 25 Hydroxy (Vit-D Deficiency, Fractures) -     Basic metabolic panel -     Cholecalciferol 1.25 MG (50000 UT) capsule; Take 1 capsule (50,000 Units total) by mouth once a week.  Colon cancer screening -     Ambulatory referral to Gastroenterology   I have changed Maximillion J. Norton's rosuvastatin. I am also having him start on Cholecalciferol. Additionally, I am having him maintain his folic acid, ketoconazole, Abilify Maintena, and benztropine. We will continue to administer cyanocobalamin and ARIPiprazole ER.  Meds ordered  this encounter  Medications   Cholecalciferol 1.25 MG (50000 UT) capsule    Sig: Take 1 capsule (50,000 Units total) by mouth once a week.    Dispense:  12 capsule    Refill:  0   rosuvastatin (CRESTOR) 20 MG tablet    Sig: Take 1 tablet (20 mg total) by mouth daily.    Dispense:  90 tablet    Refill:  1     Follow-up: Return in about 6 months (around 02/20/2023).  Scarlette Calico, MD

## 2022-08-22 NOTE — Patient Instructions (Signed)
Vitamin B12 Deficiency Vitamin B12 deficiency means that your body does not have enough vitamin B12. The body needs this important vitamin: To make red blood cells. To make genes (DNA). To help the nerves work. If you do not have enough vitamin B12 in your body, you can have health problems, such as not having enough red blood cells in the blood (anemia). What are the causes? Not eating enough foods that contain vitamin B12. Not being able to take in (absorb) vitamin B12 from the food that you eat. Certain diseases. A condition in which the body does not make enough of a certain protein. This results in your body not taking in enough vitamin B12. Having a surgery in which part of the stomach or small intestine is taken out. Taking medicines that make it hard for the body to take in vitamin B12. These include: Heartburn medicines. Some medicines that are used to treat diabetes. What increases the risk? Being an older adult. Eating a vegetarian or vegan diet that does not include any foods that come from animals. Not eating enough foods that contain vitamin B12 while you are pregnant. Taking certain medicines. Having alcoholism. What are the signs or symptoms? In some cases, there are no symptoms. If the condition leads to too few blood cells or nerve damage, symptoms can occur, such as: Feeling weak or tired. Not being hungry. Losing feeling (numbness) or tingling in your hands and feet. Redness and burning of the tongue. Feeling sad (depressed). Confusion or memory problems. Trouble walking. If anemia is very bad, symptoms can include: Being short of breath. Being dizzy. Having a very fast heartbeat. How is this treated? Changing the way you eat and drink, such as: Eating more foods that contain vitamin B12. Drinking little or no alcohol. Getting vitamin B12 shots. Taking vitamin B12 supplements by mouth (orally). Your doctor will tell you the dose that is best for you. Follow  these instructions at home: Eating and drinking  Eat foods that come from animals and have a lot of vitamin B12 in them. These include: Meats and poultry. This includes beef, pork, chicken, turkey, and organ meats, such as liver. Seafood, such as clams, rainbow trout, salmon, tuna, and haddock. Eggs. Dairy foods such as milk, yogurt, and cheese. Eat breakfast cereals that have vitamin B12 added to them (are fortified). Check the label. The items listed above may not be a complete list of foods and beverages you can eat and drink. Contact a dietitian for more information. Alcohol use Do not drink alcohol if: Your doctor tells you not to drink. You are pregnant, may be pregnant, or are planning to become pregnant. If you drink alcohol: Limit how much you have to: 0-1 drink a day for women. 0-2 drinks a day for men. Know how much alcohol is in your drink. In the [place] one drink equals one 12 oz bottle of beer (355 mL), one 5 oz glass of wine (148 mL), or one 1 oz glass of hard liquor (44 mL). General instructions Get any vitamin B12 shots if told by your doctor. Take supplements only as told by your doctor. Follow the directions. Keep all follow-up visits. Contact a doctor if: Your symptoms come back. Your symptoms get worse or do not get better with treatment. Get help right away if: You have trouble breathing. You have a very fast heartbeat. You have chest pain. You get dizzy. You faint. These symptoms may be an emergency. Get help right away. Call 911.   Do not wait to see if the symptoms will go away. Do not drive yourself to the hospital. Summary Vitamin B12 deficiency means that your body is not getting enough of the vitamin. In some cases, there are no symptoms of this condition. Treatment may include making a change in the way you eat and drink, getting shots, or taking supplements. Eat foods that have vitamin B12 in them. This information is not intended to replace advice  given to you by your health care provider. Make sure you discuss any questions you have with your health care provider. Document Revised: 02/18/2021 Document Reviewed: 02/18/2021 Elsevier Patient Education  2023 Else[employer] Inc[employer]  

## 2022-08-28 ENCOUNTER — Ambulatory Visit: Payer: Federal, State, Local not specified - PPO

## 2022-08-30 DIAGNOSIS — H401124 Primary open-angle glaucoma, left eye, indeterminate stage: Secondary | ICD-10-CM | POA: Diagnosis not present

## 2022-08-30 DIAGNOSIS — H2513 Age-related nuclear cataract, bilateral: Secondary | ICD-10-CM | POA: Diagnosis not present

## 2022-08-30 DIAGNOSIS — H401114 Primary open-angle glaucoma, right eye, indeterminate stage: Secondary | ICD-10-CM | POA: Diagnosis not present

## 2022-09-04 ENCOUNTER — Ambulatory Visit: Payer: Federal, State, Local not specified - PPO

## 2022-09-07 ENCOUNTER — Encounter (HOSPITAL_COMMUNITY): Payer: Self-pay

## 2022-09-07 ENCOUNTER — Ambulatory Visit (HOSPITAL_BASED_OUTPATIENT_CLINIC_OR_DEPARTMENT_OTHER): Payer: Federal, State, Local not specified - PPO | Admitting: Psychiatry

## 2022-09-07 VITALS — BP 114/75 | HR 78 | Ht 73.0 in | Wt 216.0 lb

## 2022-09-07 DIAGNOSIS — F319 Bipolar disorder, unspecified: Secondary | ICD-10-CM

## 2022-09-07 DIAGNOSIS — F250 Schizoaffective disorder, bipolar type: Secondary | ICD-10-CM

## 2022-09-07 NOTE — Progress Notes (Addendum)
Patient arrived for  Q 28 day injection ARIPiprazole ER (ABILIFY MAINTENA) 400 MG  Patient  doesn't make eye contact only talks when asked questions. NO SI/HI NOR AH/VH.  Patient pleasant & tolerated injection well in Left Deltoid

## 2022-09-07 NOTE — Patient Instructions (Signed)
Patient arrived for  Q 28 day injection ARIPiprazole ER (ABILIFY MAINTENA) 400 MG  Patient  doesn't make eye contact only talks when asked questions. NO SI/HI NOR AH/VH.  Patient pleasant & tolerated injection well in Left Deltoid

## 2022-09-08 ENCOUNTER — Ambulatory Visit (INDEPENDENT_AMBULATORY_CARE_PROVIDER_SITE_OTHER): Payer: Federal, State, Local not specified - PPO

## 2022-09-08 DIAGNOSIS — E538 Deficiency of other specified B group vitamins: Secondary | ICD-10-CM

## 2022-09-08 NOTE — Progress Notes (Signed)
B12 given.  Pt tolerated well. Pt is aware to give the office a call for an side effects or reactions. Please co-sign.   

## 2022-09-14 ENCOUNTER — Ambulatory Visit (HOSPITAL_COMMUNITY): Payer: Federal, State, Local not specified - PPO | Admitting: Licensed Clinical Social Worker

## 2022-09-19 ENCOUNTER — Ambulatory Visit (INDEPENDENT_AMBULATORY_CARE_PROVIDER_SITE_OTHER): Payer: Federal, State, Local not specified - PPO | Admitting: Licensed Clinical Social Worker

## 2022-09-19 DIAGNOSIS — F250 Schizoaffective disorder, bipolar type: Secondary | ICD-10-CM

## 2022-09-25 ENCOUNTER — Telehealth (HOSPITAL_COMMUNITY): Payer: Self-pay | Admitting: Psychiatry

## 2022-09-25 NOTE — Telephone Encounter (Signed)
I will write one for him.

## 2022-09-25 NOTE — Telephone Encounter (Signed)
Not sure who gave him the letter. Sharyn Lull, can you check?  Thanks

## 2022-09-25 NOTE — Telephone Encounter (Signed)
Patients spouse called in stating that the patient is needing a letter from you as to why he can't serve jury duty.

## 2022-09-26 ENCOUNTER — Encounter (HOSPITAL_COMMUNITY): Payer: Self-pay | Admitting: Licensed Clinical Social Worker

## 2022-09-26 NOTE — Addendum Note (Signed)
Addended by: Carlus Pavlov R on: 09/26/2022 01:00 PM   Modules accepted: Level of Service

## 2022-09-26 NOTE — Progress Notes (Signed)
Virtual Visit via Video Note  I connected with Curtis Townsend on 09/26/22 at 11:00 AM EDT by a video enabled telemedicine application and verified that I am speaking with the correct person using two identifiers.  Location: Patient: home Provider: office   I discussed the limitations of evaluation and management by telemedicine and the availability of in person appointments. The patient expressed understanding and agreed to proceed.   I discussed the assessment and treatment plan with the patient. The patient was provided an opportunity to ask questions and all were answered. The patient agreed with the plan and demonstrated an understanding of the instructions.   The patient was advised to call back or seek an in-person evaluation if the symptoms worsen or if the condition fails to improve as anticipated.  I provided 40 minutes of non-face-to-face time during this encounter.   Curtis Curling, LCSW   THERAPIST PROGRESS NOTE  Session Time: 11:00am-11:40am  Participation Level: Active  Behavioral Response: NeatAlertEuthymic  Type of Therapy: Individual Therapy  Treatment Goals addressed:  LTG: Reduce frequency, intensity, and duration of depression symptoms so that daily functioning is improved  LTG: Increase coping skills to manage depression and improve ability to perform daily activities   ProgressTowards Goals: Progressing  Interventions: Motivational Interviewing  Summary: Curtis Townsend is a 47 y.o. male who presents with Schizoaffective Disorder, mixed type.   Suicidal/Homicidal: Nowithout intent/plan  Therapist Response: Curtis Townsend engaged well in individual virtual session. Clinician processed thoughts, feelings and interactions using Motivational Interviewing. Clinician reflected and summarized positive interactions and overall good mood since last session. Clinician processed concerns about children in school and his relationship with daughters. Clinician  identified the importance of being a present father and being available to them as a sounding board or just a safe and trusted adult. Clinician validated the importance of being a healthy person, physically and mentally. Curtis Townsend shared that he has been consistent in getting his injection monthly and he feels stable.   Plan: Return again in 4 weeks.  Diagnosis: Schizoaffective disorder, mixed type ([facility])  Collaboration of Care: Psychiatrist AEB reviewed last note from [name]  Patient/Guardian was advised Release of Information must be obtained prior to any record release in order to collaborate their care with an outside provider. Patient/Guardian was advised if they have not already done so to contact the registration department to sign all necessary forms in order for [name] to release information regarding their care.   Consent: Patient/Guardian gives verbal consent for treatment and assignment of benefits for services provided during this visit. Patient/Guardian expressed understanding and agreed to proceed.   Curtis Townsend [place], LCSW 09/26/2022

## 2022-10-05 ENCOUNTER — Ambulatory Visit (HOSPITAL_BASED_OUTPATIENT_CLINIC_OR_DEPARTMENT_OTHER): Payer: Federal, State, Local not specified - PPO | Admitting: *Deleted

## 2022-10-05 VITALS — BP 97/67 | HR 105 | Resp 18 | Ht 73.0 in | Wt 215.0 lb

## 2022-10-05 DIAGNOSIS — F319 Bipolar disorder, unspecified: Secondary | ICD-10-CM | POA: Diagnosis not present

## 2022-10-05 DIAGNOSIS — F250 Schizoaffective disorder, bipolar type: Secondary | ICD-10-CM

## 2022-10-05 NOTE — Patient Instructions (Addendum)
Pt presents to office today for due Abilify Maintena 400 mg injection. Pt is baseline flat, guarded, with minimal to no eye contact. Pt denies lability, SI,HI, no AVH. Coarse tremor noted in left hand. Injection prepared as ordered and given in RD without complaint. Jury Duty excuse given to pt. Pt due to return for next due injection in approximately 18 days. Pt agrees to call with any questions or concerns.

## 2022-10-13 ENCOUNTER — Ambulatory Visit: Payer: Federal, State, Local not specified - PPO

## 2022-10-18 ENCOUNTER — Ambulatory Visit (INDEPENDENT_AMBULATORY_CARE_PROVIDER_SITE_OTHER): Payer: Federal, State, Local not specified - PPO | Admitting: Licensed Clinical Social Worker

## 2022-10-18 DIAGNOSIS — F250 Schizoaffective disorder, bipolar type: Secondary | ICD-10-CM | POA: Diagnosis not present

## 2022-10-19 ENCOUNTER — Encounter (HOSPITAL_COMMUNITY): Payer: Self-pay | Admitting: Licensed Clinical Social Worker

## 2022-10-19 ENCOUNTER — Ambulatory Visit (INDEPENDENT_AMBULATORY_CARE_PROVIDER_SITE_OTHER): Payer: Federal, State, Local not specified - PPO

## 2022-10-19 DIAGNOSIS — G63 Polyneuropathy in diseases classified elsewhere: Secondary | ICD-10-CM

## 2022-10-19 DIAGNOSIS — E538 Deficiency of other specified B group vitamins: Secondary | ICD-10-CM

## 2022-10-19 MED ORDER — CYANOCOBALAMIN 1000 MCG/ML IJ SOLN
1000.0000 ug | Freq: Once | INTRAMUSCULAR | Status: AC
  Administered 2022-10-19: 1000 ug via INTRAMUSCULAR

## 2022-10-19 NOTE — Progress Notes (Addendum)
Virtual Visit via Video Note  I connected with Curtis Townsend on 11/14/22 at 11:00 AM EDT by a video enabled telemedicine application and verified that I am speaking with the correct person using two identifiers.  Location: Patient: home Provider: home office   I discussed the limitations of evaluation and management by telemedicine and the availability of in person appointments. The patient expressed understanding and agreed to proceed.   I discussed the assessment and treatment plan with the patient. The patient was provided an opportunity to ask questions and all were answered. The patient agreed with the plan and demonstrated an understanding of the instructions.   The patient was advised to call back or seek an in-person evaluation if the symptoms worsen or if the condition fails to improve as anticipated.  I provided 25 minutes of non-face-to-face time during this encounter.   Veneda Melter, LCSW   THERAPIST PROGRESS NOTE  Session Time: 11:00am-11:25am  Participation Level: Active  Behavioral Response: CasualAlertEuthymic  Type of Therapy: Individual Therapy  Treatment Goals addressed: LTG: Reduce frequency, intensity, and duration of depression symptoms so that daily functioning is improved  LTG: Increase coping skills to manage depression and improve ability to perform daily activities   ProgressTowards Goals: Progressing  Interventions: CBT  Summary: Curtis Townsend is a 47 y.o. male who presents with Schizoaffective Disorder, mixed type.   Suicidal/Homicidal: Nowithout intent/plan  Therapist Response: Ahad engaged well in individual virtual session. Clinician utilized CBT to process thoughts, feelings, and behaviors. Clinician explored interactions with family members, parenting and being a husband to his wife. Clinician checked in with [facility] about his ability to modulate his moods and to be emotionally available to his family. Clinician noted stable  mood, on the lower side, but Curtis Townsend reports he is feeling fine and there have been no problems.   Plan: Return again in 4-6 weeks.  Diagnosis: Schizoaffective disorder, mixed type  Collaboration of Care: Psychiatrist AEB reviewed note from Dr. Lolly Mustache and checked consistency of injection administration.   Patient/Guardian was advised Release of Information must be obtained prior to any record release in order to collaborate their care with an outside provider. Patient/Guardian was advised if they have not already done so to contact the registration department to sign all necessary forms in order for [name] to release information regarding their care.   Consent: Patient/Guardian gives verbal consent for treatment and assignment of benefits for services provided during this visit. Patient/Guardian expressed understanding and agreed to proceed.   Chryl Heck [place], LCSW 10/19/2022

## 2022-10-19 NOTE — Progress Notes (Signed)
Patient here for monthly B12 injection per Dr. Jones.  B12 1000 mcg given in right IM and patient tolerated injection well today.  

## 2022-10-20 DIAGNOSIS — H401132 Primary open-angle glaucoma, bilateral, moderate stage: Secondary | ICD-10-CM | POA: Diagnosis not present

## 2022-11-06 ENCOUNTER — Telehealth (HOSPITAL_BASED_OUTPATIENT_CLINIC_OR_DEPARTMENT_OTHER): Payer: Federal, State, Local not specified - PPO | Admitting: Psychiatry

## 2022-11-06 ENCOUNTER — Encounter (HOSPITAL_COMMUNITY): Payer: Self-pay | Admitting: Psychiatry

## 2022-11-06 VITALS — Wt 215.0 lb

## 2022-11-06 DIAGNOSIS — F4310 Post-traumatic stress disorder, unspecified: Secondary | ICD-10-CM

## 2022-11-06 DIAGNOSIS — R251 Tremor, unspecified: Secondary | ICD-10-CM | POA: Diagnosis not present

## 2022-11-06 DIAGNOSIS — F319 Bipolar disorder, unspecified: Secondary | ICD-10-CM | POA: Diagnosis not present

## 2022-11-06 MED ORDER — BENZTROPINE MESYLATE 0.5 MG PO TABS: 0.5000 mg | ORAL_TABLET | Freq: Every day | ORAL | 2 refills | Status: DC

## 2022-11-06 MED ORDER — ABILIFY MAINTENA 400 MG IM PRSY: 400.0000 mg | PREFILLED_SYRINGE | INTRAMUSCULAR | 2 refills | Status: DC

## 2022-11-06 NOTE — Progress Notes (Signed)
[facility] MD Virtual Progress Note   Patient Location: Home Provider Location: Home Office  I connect with patient by telephone and verified that I am speaking with correct person by using two identifiers. I discussed the limitations of evaluation and management by telemedicine and the availability of in person appointments. I also discussed with the patient that there may be a patient responsible charge related to this service. The patient expressed understanding and agreed to proceed.  Curtis Townsend [id number] 47 y.o.  11/06/2022 2:52 PM  History of Present Illness:  Patient is evaluated by phone session.  He is doing well on Cogentin and Abilify injection.  He denies any nightmares or flashback.  He denies any mood swing, mania, anger or any impulsive behavior.  He is helping his kids and also in therapy with [name] once a month.  He liked the Cogentin which is helping his tremors and shakes.  He denies any hallucination, paranoia or any suicidal thoughts.  His appetite is okay.  His weight is stable.  Like to keep his current medication.  He sleeps good.  Past Psychiatric History: H/O abuse, anger, paranoia, ADHD and poor impulse control. H/O suicidal attempt as tried to hang himself and sister saved him.  H/O multiple inpatient treatment and last inpatient in September 2019. H/O THC use. Took Prozac, Risperdal, Risperdal, Depakote, hydroxyzine, Depakote and Trazodone but discontinued due to noncompliance.      Outpatient Encounter Medications as of 11/06/2022  Medication Sig   ARIPiprazole ER (ABILIFY MAINTENA) 400 MG PRSY prefilled syringe Inject 400 mg into the muscle every 28 (twenty-eight) days.   benztropine (COGENTIN) 0.5 MG tablet Take 1 tablet (0.5 mg total) by mouth at bedtime.   Cholecalciferol 1.25 MG (50000 UT) capsule Take 1 capsule (50,000 Units total) by mouth once a week.   folic acid (FOLVITE) 1 MG tablet TAKE 1 TABLET BY MOUTH EVERY DAY    ketoconazole (NIZORAL) 2 % shampoo Apply 1 application. topically 2 (two) times a week.   rosuvastatin (CRESTOR) 20 MG tablet Take 1 tablet (20 mg total) by mouth daily.   Facility-Administered Encounter Medications as of 11/06/2022  Medication   ARIPiprazole ER (ABILIFY MAINTENA) 400 MG prefilled syringe 400 mg   cyanocobalamin ((VITAMIN B-12)) injection 1,000 mcg    Recent Results (from the past 2160 hour(s))  VITAMIN D 25 Hydroxy (Vit-D Deficiency, Fractures)     Status: Abnormal   Collection Time: 08/22/22  9:53 AM  Result Value Ref Range   VITD 8.17 (L) 30.00 - 100.00 ng/mL  Basic metabolic panel     Status: Abnormal   Collection Time: 08/22/22  9:53 AM  Result Value Ref Range   Sodium 142 135 - 145 mEq/L   Potassium 4.0 3.5 - 5.1 mEq/L   Chloride 104 96 - 112 mEq/L   CO2 30 19 - 32 mEq/L   Glucose, Bld 123 (H) 70 - 99 mg/dL   BUN 9 6 - 23 mg/dL   Creatinine, Ser 0.86 0.40 - 1.50 mg/dL   GFR 57.84 >69.62 mL/min    Comment: Calculated using the CKD-EPI Creatinine Equation (2021)   Calcium 10.1 8.4 - 10.5 mg/dL  Folate     Status: None   Collection Time: 08/22/22  9:53 AM  Result Value Ref Range   Folate 6.4 >5.9 ng/mL  TSH     Status: None   Collection Time: 08/22/22  9:53 AM  Result Value Ref Range   TSH 0.77 0.35 - 5.50 uIU/mL  Hepatic function panel     Status: None   Collection Time: 08/22/22  9:53 AM  Result Value Ref Range   Total Bilirubin 0.7 0.2 - 1.2 mg/dL   Bilirubin, Direct 0.1 0.0 - 0.3 mg/dL   Alkaline Phosphatase 90 39 - 117 U/L   AST 17 0 - 37 U/L   ALT 20 0 - 53 U/L   Total Protein 7.4 6.0 - 8.3 g/dL   Albumin 4.3 3.5 - 5.2 g/dL  CBC with Differential/Platelet     Status: Abnormal   Collection Time: 08/22/22  9:53 AM  Result Value Ref Range   WBC 5.9 4.0 - 10.5 K/uL   RBC 5.44 4.22 - 5.81 Mil/uL   Hemoglobin 17.1 (H) 13.0 - 17.0 g/dL   HCT 16.1 09.6 - 04.5 %   MCV 93.1 78.0 - 100.0 fl   MCHC 33.7 30.0 - 36.0 g/dL   RDW 40.9 81.1 - 91.4 %    Platelets 184.0 150.0 - 400.0 K/uL   Neutrophils Relative % 54.9 43.0 - 77.0 %   Lymphocytes Relative 37.9 12.0 - 46.0 %   Monocytes Relative 4.7 3.0 - 12.0 %   Eosinophils Relative 1.5 0.0 - 5.0 %   Basophils Relative 1.0 0.0 - 3.0 %   Neutro Abs 3.2 1.4 - 7.7 K/uL   Lymphs Abs 2.2 0.7 - 4.0 K/uL   Monocytes Absolute 0.3 0.1 - 1.0 K/uL   Eosinophils Absolute 0.1 0.0 - 0.7 K/uL   Basophils Absolute 0.1 0.0 - 0.1 K/uL  Lipid panel     Status: Abnormal   Collection Time: 08/22/22  9:53 AM  Result Value Ref Range   Cholesterol 280 (H) 0 - 200 mg/dL    Comment: ATP III Classification       Desirable:  < 200 mg/dL               Borderline High:  200 - 239 mg/dL          High:  > = 782 mg/dL   Triglycerides 956.2 0.0 - 149.0 mg/dL    Comment: Normal:  <130 mg/dLBorderline High:  150 - 199 mg/dL   HDL 86.57 >84.69 mg/dL   VLDL 62.9 0.0 - 52.8 mg/dL   LDL Cholesterol 413 (H) 0 - 99 mg/dL   Total CHOL/HDL Ratio 6     Comment:                Men          Women1/2 Average Risk     3.4          3.3Average Risk          5.0          4.42X Average Risk          9.6          7.13X Average Risk          15.0          11.0                       NonHDL 229.44     Comment: NOTE:  Non-HDL goal should be 30 mg/dL higher than patient's LDL goal (i.e. LDL goal of < 70 mg/dL, would have non-HDL goal of < 100 mg/dL)     Psychiatric Specialty Exam: Physical Exam  Review of Systems  Weight 215 lb (97.5 kg).Body mass index is 28.37 kg/m.  General Appearance: NA  Eye Contact:  NA  Speech:  Clear and Coherent and Normal Rate  Volume:  Normal  Mood:  Euthymic  Affect:  NA  Thought Process:  Goal Directed  Orientation:  Full (Time, Place, and Person)  Thought Content:  WDL  Suicidal Thoughts:  No  Homicidal Thoughts:  No  Memory:  Immediate;   Good Recent;   Fair Remote;   Good  Judgement:  Intact  Insight:  Present  Psychomotor Activity:  NA  Concentration:  Concentration: Fair and Attention Span:  Fair  Recall:  [employer] of Knowledge:  Fair  Language:  Good  Akathisia:  No  Handed:  Right  AIMS (if indicated):     Assets:  Communication Skills Desire for Improvement Housing Social Support Transportation  ADL's:  Intact  Cognition:  WNL  Sleep:  ok     Assessment/Plan: Bipolar I disorder (HCC) - Plan: ARIPiprazole ER (ABILIFY MAINTENA) 400 MG PRSY prefilled syringe, benztropine (COGENTIN) 0.5 MG tablet  PTSD (post-traumatic stress disorder) - Plan: ARIPiprazole ER (ABILIFY MAINTENA) 400 MG PRSY prefilled syringe, benztropine (COGENTIN) 0.5 MG tablet  Tremor - Plan: benztropine (COGENTIN) 0.5 MG tablet  Patient is stable on current medication.  Continue Abilify 400 mg intramuscular every 28 days and Cogentin 0.5 mg at bedtime that helps her tremors and sleep.  Discussed medication side effects and benefits.  Recommend to call us back if is any question or any concern.  Follow-up in 3 months.  Continue therapy with [name].   Follow Up Instructions:     I discussed the assessment and treatment plan with the patient. The patient was provided an opportunity to ask questions and all were answered. The patient agreed with the plan and demonstrated an understanding of the instructions.   The patient was advised to call back or seek an in-person evaluation if the symptoms worsen or if the condition fails to improve as anticipated.    Collaboration of Care: Other provider involved in patient's care AEB notes are available in epic to review.  Patient/Guardian was advised Release of Information must be obtained prior to any record release in order to collaborate their care with an outside provider. Patient/Guardian was advised if they have not already done so to contact the registration department to sign all necessary forms in order for [name] to release information regarding their care.   Consent: Patient/Guardian gives verbal consent for treatment and assignment of benefits for  services provided during this visit. Patient/Guardian expressed understanding and agreed to proceed.     I provided 18 minutes of non face to face time during this encounter.  Note: This document was prepared by [employer] voice dictation technology and any errors that results from this process are unintentional.    Cleotis Nipper, MD 11/06/2022

## 2022-11-07 ENCOUNTER — Ambulatory Visit (HOSPITAL_BASED_OUTPATIENT_CLINIC_OR_DEPARTMENT_OTHER): Payer: Federal, State, Local not specified - PPO

## 2022-11-07 ENCOUNTER — Encounter (HOSPITAL_COMMUNITY): Payer: Self-pay

## 2022-11-07 VITALS — BP 104/74 | HR 77 | Temp 98.4°F | Ht 72.0 in | Wt 213.0 lb

## 2022-11-07 DIAGNOSIS — F319 Bipolar disorder, unspecified: Secondary | ICD-10-CM

## 2022-11-07 DIAGNOSIS — F250 Schizoaffective disorder, bipolar type: Secondary | ICD-10-CM

## 2022-11-07 NOTE — Progress Notes (Addendum)
Patient in today for due Abilify Maintena 400 mg IM every 28 day injection and presented with flat affect, level and pleasant mood and denied any auditory or visual hallucinations, no suicidal or homicidal ideations and no plan, intent, or means stated to want to harm self or others.  Patient reported doing well on current medication regimen and no complaints.  Patient's due Abilify Maintena 400 mg IM injection prepared as ordered and given to patient in his left deltoid area.  Patient tolerated due injection without complaint of pain or discomfort and agreed to return 4 weeks for next due injection.  Patient to call if any issues prior to next appointment.

## 2022-11-12 ENCOUNTER — Other Ambulatory Visit: Payer: Self-pay | Admitting: Internal Medicine

## 2022-11-12 DIAGNOSIS — E559 Vitamin D deficiency, unspecified: Secondary | ICD-10-CM

## 2022-11-15 ENCOUNTER — Ambulatory Visit (INDEPENDENT_AMBULATORY_CARE_PROVIDER_SITE_OTHER): Payer: Federal, State, Local not specified - PPO | Admitting: Licensed Clinical Social Worker

## 2022-11-15 ENCOUNTER — Encounter (HOSPITAL_COMMUNITY): Payer: Self-pay | Admitting: Licensed Clinical Social Worker

## 2022-11-15 DIAGNOSIS — F250 Schizoaffective disorder, bipolar type: Secondary | ICD-10-CM

## 2022-11-15 NOTE — Progress Notes (Signed)
Virtual Visit via Video Note  I connected with Curtis Townsend on 11/15/22 at 11:00 AM EDT by a video enabled telemedicine application and verified that I am speaking with the correct person using two identifiers.  Location: Patient: home Provider: home office   I discussed the limitations of evaluation and management by telemedicine and the availability of in person appointments. The patient expressed understanding and agreed to proceed.   I discussed the assessment and treatment plan with the patient. The patient was provided an opportunity to ask questions and all were answered. The patient agreed with the plan and demonstrated an understanding of the instructions.   The patient was advised to call back or seek an in-person evaluation if the symptoms worsen or if the condition fails to improve as anticipated.  I provided 20 minutes of non-face-to-face time during this encounter.   Curtis Melter, LCSW   THERAPIST PROGRESS NOTE  Session Time: 11:00am-11:20am  Participation Level: Active  Behavioral Response: NeatAlertEuthymic  Type of Therapy: Individual Therapy  Treatment Goals addressed: LTG: Reduce frequency, intensity, and duration of depression symptoms so that daily functioning is improved  LTG: Increase coping skills to manage depression and improve ability to perform daily activities  ProgressTowards Goals: Progressing  Interventions: CBT  Summary: Curtis Townsend is a 47 y.o. male who presents with Schizoaffective Disorder, mixed.   Suicidal/Homicidal: Nowithout intent/plan  Therapist Response: Curtis Townsend engaged well in individual virtual session with clinician. Clinician utilized CBT to process thoughts, feelings, and interactions. Clinician explored updates on mood, family, and communication with wife. Clinician identified the importance of being a united front with wife regarding parenting. Curtis Townsend shared some challenges with youngest son, noting that he  has been misbehaving at school. Clinician explored thoughts and feelings about son's behavior, normalized frustration, and discussed ways that he and wife decide consequences. Clinician reflected the comfort in communicating well with wife. Clinician noted that wife will be away for 2 weeks this summer for work, which will be challenging. Clinician encouraged Curtis Townsend to start planning now and to communicate with mother about having her help out with the kids. Curtis Townsend's mood has been stable. He wants to continue monthly check in appointments.   Plan: Return again in 5-6 weeks.  Diagnosis: Schizoaffective disorder, mixed type (HCC)  Collaboration of Care: Patient refused AEB none required  Patient/Guardian was advised Release of Information must be obtained prior to any record release in order to collaborate their care with an outside provider. Patient/Guardian was advised if they have not already done so to contact the registration department to sign all necessary forms in order for [name] to release information regarding their care.   Consent: Patient/Guardian gives verbal consent for treatment and assignment of benefits for services provided during this visit. Patient/Guardian expressed understanding and agreed to proceed.   Curtis Townsend [place], LCSW 11/15/2022

## 2022-11-18 ENCOUNTER — Other Ambulatory Visit: Payer: Self-pay | Admitting: Internal Medicine

## 2022-11-18 DIAGNOSIS — L219 Seborrheic dermatitis, unspecified: Secondary | ICD-10-CM

## 2022-11-20 ENCOUNTER — Ambulatory Visit (INDEPENDENT_AMBULATORY_CARE_PROVIDER_SITE_OTHER): Payer: Federal, State, Local not specified - PPO

## 2022-11-20 DIAGNOSIS — E538 Deficiency of other specified B group vitamins: Secondary | ICD-10-CM

## 2022-11-20 MED ORDER — CYANOCOBALAMIN 1000 MCG/ML IJ SOLN
1000.0000 ug | Freq: Once | INTRAMUSCULAR | Status: AC
  Administered 2022-11-20: 1000 ug via INTRAMUSCULAR

## 2022-11-20 NOTE — Progress Notes (Signed)
After obtaining consent, and per orders of Dr. Jones, injection of B12 given by Zacarias Krauter P Oseias Horsey. Patient instructed to report any adverse reaction to me immediately.  

## 2022-12-05 ENCOUNTER — Ambulatory Visit (HOSPITAL_BASED_OUTPATIENT_CLINIC_OR_DEPARTMENT_OTHER): Payer: Federal, State, Local not specified - PPO

## 2022-12-05 VITALS — BP 104/72 | HR 94 | Ht 73.0 in | Wt 218.0 lb

## 2022-12-05 DIAGNOSIS — F250 Schizoaffective disorder, bipolar type: Secondary | ICD-10-CM

## 2022-12-05 DIAGNOSIS — F319 Bipolar disorder, unspecified: Secondary | ICD-10-CM

## 2022-12-05 NOTE — Progress Notes (Unsigned)
Patient in today for due Abilify Maintena 400 mg IM every 28 day injection and presented with flat affect, level and pleasant mood and denied any auditory or visual hallucinations, no suicidal or homicidal ideations and no plan, intent, or means stated to want to harm self or others.  Patient reported doing well on current medication regimen and no complaints.  Patient's due Abilify Maintena 400 mg IM injection prepared as ordered and given to patient in his right deltoid area.  Patient tolerated due injection without complaint of pain or discomfort and agreed to return 4 weeks for next due injection.  Patient to call if any issues prior to next appointment. Lot # [id number] exp. 06/2023

## 2022-12-21 ENCOUNTER — Ambulatory Visit (INDEPENDENT_AMBULATORY_CARE_PROVIDER_SITE_OTHER): Payer: Federal, State, Local not specified - PPO | Admitting: Licensed Clinical Social Worker

## 2022-12-21 ENCOUNTER — Ambulatory Visit: Payer: Federal, State, Local not specified - PPO

## 2022-12-21 DIAGNOSIS — F250 Schizoaffective disorder, bipolar type: Secondary | ICD-10-CM | POA: Diagnosis not present

## 2022-12-23 ENCOUNTER — Other Ambulatory Visit (HOSPITAL_COMMUNITY): Payer: Self-pay | Admitting: Psychiatry

## 2022-12-23 DIAGNOSIS — F4310 Post-traumatic stress disorder, unspecified: Secondary | ICD-10-CM

## 2022-12-23 DIAGNOSIS — F319 Bipolar disorder, unspecified: Secondary | ICD-10-CM

## 2022-12-23 DIAGNOSIS — R251 Tremor, unspecified: Secondary | ICD-10-CM

## 2022-12-25 ENCOUNTER — Encounter (HOSPITAL_COMMUNITY): Payer: Self-pay | Admitting: Licensed Clinical Social Worker

## 2022-12-25 NOTE — Progress Notes (Signed)
Virtual Visit via Video Note  I connected with Curtis Townsend on 12/25/22 at 11:00 AM EDT by a video enabled telemedicine application and verified that I am speaking with the correct person using two identifiers.  Location: Patient: home Provider: home office   I discussed the limitations of evaluation and management by telemedicine and the availability of in person appointments. The patient expressed understanding and agreed to proceed.   I discussed the assessment and treatment plan with the patient. The patient was provided an opportunity to ask questions and all were answered. The patient agreed with the plan and demonstrated an understanding of the instructions.   The patient was advised to call back or seek an in-person evaluation if the symptoms worsen or if the condition fails to improve as anticipated.  I provided 30 minutes of non-face-to-face time during this encounter.   Veneda Melter, LCSW   THERAPIST PROGRESS NOTE  Session Time: 11:00am-11:30am  Participation Level: Active  Behavioral Response: NeatAlertEuthymic  Type of Therapy: Individual Therapy  Treatment Goals addressed: LTG: Reduce frequency, intensity, and duration of depression symptoms so that daily functioning is improved  LTG: Increase coping skills to manage depression and improve ability to perform daily activities  ProgressTowards Goals: Progressing  Interventions: CBT  Summary: Curtis Townsend is a 47 y.o. male who presents with Schizoaffective Disorder, mixed type.   Suicidal/Homicidal: Nowithout intent/plan  Therapist Response: Curtis Townsend engaged well in virtual session with Facilities manager. Clinician utilized CBT to process thoughts, feelings, and interactions. Clinician explored updates with moods, behaviors. Curtis Townsend reports he has been doing well in managing his moods, noting stability for a long time due to injections. Clinician discussed updates with children and wife, processed some  parenting concerns, and clinician identified opportunities to engage 1:1 with children over the summer. Clinician processed the past 2 weeks where wife was away for work. Clinician noted that Makayla was able to cope well with getting the kids to school, keeping up with housework, and maintaining the household while wife was away.   Plan: Return again in 4 weeks.  Diagnosis: Schizoaffective disorder, mixed type (HCC)  Collaboration of Care: Patient refused AEB none required  Patient/Guardian was advised Release of Information must be obtained prior to any record release in order to collaborate their care with an outside provider. Patient/Guardian was advised if they have not already done so to contact the registration department to sign all necessary forms in order for [name] to release information regarding their care.   Consent: Patient/Guardian gives verbal consent for treatment and assignment of benefits for services provided during this visit. Patient/Guardian expressed understanding and agreed to proceed.   Chryl Heck [place], LCSW 12/25/2022

## 2023-01-02 ENCOUNTER — Other Ambulatory Visit: Payer: Self-pay

## 2023-01-02 ENCOUNTER — Encounter (HOSPITAL_COMMUNITY): Payer: Self-pay

## 2023-01-02 ENCOUNTER — Ambulatory Visit (HOSPITAL_BASED_OUTPATIENT_CLINIC_OR_DEPARTMENT_OTHER): Payer: Federal, State, Local not specified - PPO

## 2023-01-02 VITALS — BP 118/74 | HR 92 | Ht 73.0 in | Wt 214.0 lb

## 2023-01-02 DIAGNOSIS — F250 Schizoaffective disorder, bipolar type: Secondary | ICD-10-CM | POA: Diagnosis not present

## 2023-01-02 DIAGNOSIS — F319 Bipolar disorder, unspecified: Secondary | ICD-10-CM | POA: Diagnosis not present

## 2023-01-02 NOTE — Progress Notes (Signed)
Patient presents today for his due injection of Abilify Maintena 400 mg. Patient presents slightly disheveled, little to no eye contact and a flat affect. Patient was not very talkative today but said he is not having any VH/AH or SI/HI. The injection was prepared as ordered and given in patients left deltoid. Patient tolerated well and without complaint. He will return in 28 days for next due injection.  NDC: 69629-528-41 LOT: [id number] EXP. Dec 2026

## 2023-01-15 ENCOUNTER — Ambulatory Visit (INDEPENDENT_AMBULATORY_CARE_PROVIDER_SITE_OTHER): Payer: Federal, State, Local not specified - PPO

## 2023-01-15 DIAGNOSIS — E538 Deficiency of other specified B group vitamins: Secondary | ICD-10-CM | POA: Diagnosis not present

## 2023-01-15 MED ORDER — CYANOCOBALAMIN 1000 MCG/ML IJ SOLN
1000.0000 ug | Freq: Once | INTRAMUSCULAR | Status: AC
  Administered 2023-01-15: 1000 ug via INTRAMUSCULAR

## 2023-01-15 NOTE — Progress Notes (Signed)
Pt here for monthly B12 injection per   B12 1000mcg given IM and pt tolerated injection well.    

## 2023-01-30 ENCOUNTER — Encounter (HOSPITAL_COMMUNITY): Payer: Self-pay

## 2023-01-30 ENCOUNTER — Other Ambulatory Visit: Payer: Self-pay

## 2023-01-30 ENCOUNTER — Ambulatory Visit (HOSPITAL_BASED_OUTPATIENT_CLINIC_OR_DEPARTMENT_OTHER): Payer: Federal, State, Local not specified - PPO

## 2023-01-30 VITALS — BP 121/78 | HR 90 | Ht 73.0 in | Wt 210.0 lb

## 2023-01-30 DIAGNOSIS — F250 Schizoaffective disorder, bipolar type: Secondary | ICD-10-CM

## 2023-01-30 DIAGNOSIS — F319 Bipolar disorder, unspecified: Secondary | ICD-10-CM | POA: Diagnosis not present

## 2023-01-30 NOTE — Progress Notes (Signed)
Patient presents today for his due injection of Abilify Maintena 400 mg. Patient is well groomed and polite with a guarded affect. Patient is not having any SI/HI or AH/VH. He has no questions or concerns at this time. I had patient sign a form for [employer] so that we can attempt to get the Abilify injection covered by insurance. Injection was prepared as ordered and given in patients Right Deltoid per request, patient does not like shot in the glute. Patient will return in 28 days for his next due injection.  NDC: 95621-308-65 LOT: [id number] EXP: DEC 2026

## 2023-02-05 ENCOUNTER — Encounter (HOSPITAL_COMMUNITY): Payer: Self-pay | Admitting: Psychiatry

## 2023-02-05 ENCOUNTER — Telehealth (HOSPITAL_BASED_OUTPATIENT_CLINIC_OR_DEPARTMENT_OTHER): Payer: Federal, State, Local not specified - PPO | Admitting: Psychiatry

## 2023-02-05 VITALS — Wt 210.0 lb

## 2023-02-05 DIAGNOSIS — F319 Bipolar disorder, unspecified: Secondary | ICD-10-CM

## 2023-02-05 DIAGNOSIS — F4310 Post-traumatic stress disorder, unspecified: Secondary | ICD-10-CM | POA: Diagnosis not present

## 2023-02-05 DIAGNOSIS — R251 Tremor, unspecified: Secondary | ICD-10-CM | POA: Diagnosis not present

## 2023-02-05 MED ORDER — BENZTROPINE MESYLATE 0.5 MG PO TABS: 0.5000 mg | ORAL_TABLET | Freq: Every day | ORAL | 2 refills | Status: DC

## 2023-02-05 MED ORDER — ABILIFY MAINTENA 400 MG IM PRSY: 400.0000 mg | PREFILLED_SYRINGE | INTRAMUSCULAR | Status: DC
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
## 2023-02-07 ENCOUNTER — Other Ambulatory Visit: Payer: Self-pay | Admitting: Internal Medicine

## 2023-02-07 DIAGNOSIS — E559 Vitamin D deficiency, unspecified: Secondary | ICD-10-CM

## 2023-02-15 ENCOUNTER — Ambulatory Visit (INDEPENDENT_AMBULATORY_CARE_PROVIDER_SITE_OTHER): Payer: Federal, State, Local not specified - PPO

## 2023-02-15 DIAGNOSIS — G63 Polyneuropathy in diseases classified elsewhere: Secondary | ICD-10-CM

## 2023-02-15 DIAGNOSIS — E538 Deficiency of other specified B group vitamins: Secondary | ICD-10-CM | POA: Diagnosis not present

## 2023-02-15 NOTE — Progress Notes (Signed)
Pt here for B12 injection per Dr. Yetta Barre  B12 given IM and pt tolerated injection well.

## 2023-02-22 ENCOUNTER — Encounter (INDEPENDENT_AMBULATORY_CARE_PROVIDER_SITE_OTHER): Payer: Self-pay

## 2023-02-27 ENCOUNTER — Ambulatory Visit (HOSPITAL_BASED_OUTPATIENT_CLINIC_OR_DEPARTMENT_OTHER): Payer: Federal, State, Local not specified - PPO

## 2023-02-27 ENCOUNTER — Other Ambulatory Visit: Payer: Self-pay

## 2023-02-27 ENCOUNTER — Encounter (HOSPITAL_COMMUNITY): Payer: Self-pay

## 2023-02-27 VITALS — BP 107/74 | HR 92 | Ht 73.0 in | Wt 213.0 lb

## 2023-02-27 DIAGNOSIS — F250 Schizoaffective disorder, bipolar type: Secondary | ICD-10-CM

## 2023-02-27 DIAGNOSIS — F319 Bipolar disorder, unspecified: Secondary | ICD-10-CM | POA: Diagnosis not present

## 2023-02-27 NOTE — Progress Notes (Signed)
Patient presents today for his due injection of Abilify Maintena 400 mg. Patient is well groomed and has a flat affect per his usual presentation. Patient denies any SI/HI or AVH at this time. He is spending the rest of the day back to school shopping for his kids. Injection was prepared as ordered and administered in patients left deltoid per his request. Patient tolerated well and without complaint and will return in 28 days for his next due injection

## 2023-03-02 DIAGNOSIS — H401114 Primary open-angle glaucoma, right eye, indeterminate stage: Secondary | ICD-10-CM | POA: Diagnosis not present

## 2023-03-02 DIAGNOSIS — H401124 Primary open-angle glaucoma, left eye, indeterminate stage: Secondary | ICD-10-CM | POA: Diagnosis not present

## 2023-03-02 DIAGNOSIS — H2513 Age-related nuclear cataract, bilateral: Secondary | ICD-10-CM | POA: Diagnosis not present

## 2023-03-15 ENCOUNTER — Ambulatory Visit (INDEPENDENT_AMBULATORY_CARE_PROVIDER_SITE_OTHER): Payer: Federal, State, Local not specified - PPO

## 2023-03-15 DIAGNOSIS — E538 Deficiency of other specified B group vitamins: Secondary | ICD-10-CM | POA: Diagnosis not present

## 2023-03-15 MED ORDER — CYANOCOBALAMIN 1000 MCG/ML IJ SOLN
1000.0000 ug | Freq: Once | INTRAMUSCULAR | Status: AC
Start: 2023-03-15 — End: 2023-03-15
  Administered 2023-03-15: 1000 ug via INTRAMUSCULAR

## 2023-03-15 NOTE — Progress Notes (Signed)
Pt was given B12 injection with no complications.

## 2023-03-30 ENCOUNTER — Encounter (HOSPITAL_COMMUNITY): Payer: Self-pay

## 2023-03-30 ENCOUNTER — Other Ambulatory Visit: Payer: Self-pay

## 2023-03-30 ENCOUNTER — Ambulatory Visit (HOSPITAL_BASED_OUTPATIENT_CLINIC_OR_DEPARTMENT_OTHER): Payer: Federal, State, Local not specified - PPO

## 2023-03-30 VITALS — BP 114/77 | HR 79 | Ht 73.0 in | Wt 214.0 lb

## 2023-03-30 DIAGNOSIS — F250 Schizoaffective disorder, bipolar type: Secondary | ICD-10-CM

## 2023-03-30 DIAGNOSIS — F319 Bipolar disorder, unspecified: Secondary | ICD-10-CM

## 2023-03-30 NOTE — Progress Notes (Signed)
Patient arrived today for his due injection of Abilify Maintena 400 mg. Patient was well groomed and polite upon approach. Patient talked a little about his kids being back in school, he has one in high school, one in middle and one in elementary. Patient denies any SI/HI or AVH. Injection was prepared as ordered and administered in patients right deltoid. Patient tolerated well and without complaint and had no questions or concerns for me. Patient will return in 28 days for his next due injection.   NDC: 32440-102-72 LOT: [id number] EXP: OCT 2026

## 2023-04-16 ENCOUNTER — Ambulatory Visit (INDEPENDENT_AMBULATORY_CARE_PROVIDER_SITE_OTHER): Payer: Federal, State, Local not specified - PPO

## 2023-04-16 DIAGNOSIS — E538 Deficiency of other specified B group vitamins: Secondary | ICD-10-CM

## 2023-04-16 NOTE — Progress Notes (Signed)
Pt here for monthly B12 injection per Dr. Yetta Barre  B12 given IM. and pt tolerated injection well.

## 2023-04-17 ENCOUNTER — Other Ambulatory Visit (HOSPITAL_COMMUNITY): Payer: Self-pay

## 2023-04-17 DIAGNOSIS — F319 Bipolar disorder, unspecified: Secondary | ICD-10-CM

## 2023-04-17 DIAGNOSIS — F4310 Post-traumatic stress disorder, unspecified: Secondary | ICD-10-CM

## 2023-04-17 MED ORDER — ABILIFY MAINTENA 400 MG IM PRSY: 400.0000 mg | PREFILLED_SYRINGE | INTRAMUSCULAR | 12 refills | Status: DC

## 2023-04-26 ENCOUNTER — Ambulatory Visit: Payer: Federal, State, Local not specified - PPO | Admitting: Internal Medicine

## 2023-04-27 ENCOUNTER — Encounter (HOSPITAL_COMMUNITY): Payer: Self-pay

## 2023-04-27 ENCOUNTER — Other Ambulatory Visit: Payer: Self-pay

## 2023-04-27 ENCOUNTER — Ambulatory Visit (HOSPITAL_BASED_OUTPATIENT_CLINIC_OR_DEPARTMENT_OTHER): Payer: Federal, State, Local not specified - PPO

## 2023-04-27 VITALS — BP 128/80 | HR 64 | Ht 73.0 in | Wt 216.0 lb

## 2023-04-27 DIAGNOSIS — F319 Bipolar disorder, unspecified: Secondary | ICD-10-CM | POA: Diagnosis not present

## 2023-04-27 NOTE — Progress Notes (Signed)
Patient arrived today for his due injection of Abilify Maintena 400 mg. Patient is well groomed and appropriately dressed. We have been working with [employer]il[employer] to get his shot approved and I was informed today that patient can switch to the 2 month Abilify and it will be covered at 100%. I spoke with Dr. Lolly Mustache who agreed to the switch if patient was okay with it. I discussed with the patient and he is willing to give it a shot. I will inform Dr. Lolly Mustache and when patient comes next month he will get the new shot. Patient denies any SI/HI or AVH. The injection was prepared as ordered and administered in patients left deltoid. Patient tolerated well and without complaint and will return in 28 days for his next due injection.   NDC: 29562-130-86 LOT: [id number] EXP: DEC 2026

## 2023-04-30 ENCOUNTER — Other Ambulatory Visit (HOSPITAL_COMMUNITY): Payer: Self-pay

## 2023-04-30 DIAGNOSIS — F250 Schizoaffective disorder, bipolar type: Secondary | ICD-10-CM

## 2023-04-30 MED ORDER — ABILIFY ASIMTUFII 960 MG/3.2ML IM PRSY
960.0000 mg | PREFILLED_SYRINGE | INTRAMUSCULAR | 5 refills | Status: DC
Start: 2023-04-30 — End: 2023-05-08

## 2023-05-08 ENCOUNTER — Telehealth (HOSPITAL_COMMUNITY): Payer: Federal, State, Local not specified - PPO | Admitting: Psychiatry

## 2023-05-08 ENCOUNTER — Other Ambulatory Visit (HOSPITAL_COMMUNITY): Payer: Self-pay | Admitting: Psychiatry

## 2023-05-08 ENCOUNTER — Encounter (HOSPITAL_COMMUNITY): Payer: Self-pay | Admitting: Psychiatry

## 2023-05-08 VITALS — Wt 216.0 lb

## 2023-05-08 DIAGNOSIS — R251 Tremor, unspecified: Secondary | ICD-10-CM | POA: Diagnosis not present

## 2023-05-08 DIAGNOSIS — F319 Bipolar disorder, unspecified: Secondary | ICD-10-CM | POA: Diagnosis not present

## 2023-05-08 DIAGNOSIS — F4310 Post-traumatic stress disorder, unspecified: Secondary | ICD-10-CM

## 2023-05-08 MED ORDER — ABILIFY ASIMTUFII 960 MG/3.2ML IM PRSY
960.0000 mg | PREFILLED_SYRINGE | INTRAMUSCULAR | 5 refills | Status: DC
Start: 2023-05-08 — End: 2023-07-30

## 2023-05-08 MED ORDER — BENZTROPINE MESYLATE 0.5 MG PO TABS
0.5000 mg | ORAL_TABLET | Freq: Every day | ORAL | 2 refills | Status: DC
Start: 2023-05-08 — End: 2023-11-06
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
## 2023-05-17 ENCOUNTER — Ambulatory Visit: Payer: Federal, State, Local not specified - PPO

## 2023-05-17 DIAGNOSIS — E538 Deficiency of other specified B group vitamins: Secondary | ICD-10-CM | POA: Diagnosis not present

## 2023-05-17 NOTE — Progress Notes (Signed)
Patient visits today to receive her Monthly B12 vaccine. Patient was informed and tolerated well. Patient was notified to reach out to [name] if needed.

## 2023-05-25 ENCOUNTER — Ambulatory Visit (HOSPITAL_COMMUNITY): Payer: Federal, State, Local not specified - PPO | Admitting: *Deleted

## 2023-05-25 VITALS — BP 120/77 | HR 85 | Resp 18 | Ht 73.0 in | Wt 218.0 lb

## 2023-05-25 DIAGNOSIS — F250 Schizoaffective disorder, bipolar type: Secondary | ICD-10-CM

## 2023-05-25 DIAGNOSIS — F319 Bipolar disorder, unspecified: Secondary | ICD-10-CM

## 2023-05-25 MED ORDER — ARIPIPRAZOLE ER 960 MG/3.2ML IM PRSY
960.0000 mg | PREFILLED_SYRINGE | Freq: Once | INTRAMUSCULAR | Status: AC
Start: 2023-05-25 — End: 2023-05-25
  Administered 2023-05-25: 960 mg via INTRAMUSCULAR

## 2023-05-25 NOTE — Patient Instructions (Signed)
Pt presents today for due Abilify Asimtufii 960 mg/3.2 ml injection. Pt switching from previous injection of monthly Abilify Maintena 400 mg injection. Pt affect and mood baseline flat, guarded with very minimal eye contact and wearing dark glasses. Pt is cooperative and pleasant on approach. Pt denies any lability, si, hi, or frank AVH. Pt advised this injection has to be given in gluteal muscle and requires and longer needle as medication is longer acting than previous LAI. Pt verbalizes understanding.Injection prepared as ordered and given in RUOQ using a 1.5" needle without complaint. Pt is to return in 2 months for next due injection.

## 2023-05-29 ENCOUNTER — Ambulatory Visit (INDEPENDENT_AMBULATORY_CARE_PROVIDER_SITE_OTHER): Payer: Federal, State, Local not specified - PPO | Admitting: Internal Medicine

## 2023-05-29 ENCOUNTER — Encounter: Payer: Self-pay | Admitting: Internal Medicine

## 2023-05-29 VITALS — BP 116/76 | HR 86 | Temp 98.4°F | Resp 16 | Ht 73.0 in | Wt 215.8 lb

## 2023-05-29 DIAGNOSIS — Z125 Encounter for screening for malignant neoplasm of prostate: Secondary | ICD-10-CM | POA: Diagnosis not present

## 2023-05-29 DIAGNOSIS — R5383 Other fatigue: Secondary | ICD-10-CM | POA: Diagnosis not present

## 2023-05-29 DIAGNOSIS — G63 Polyneuropathy in diseases classified elsewhere: Secondary | ICD-10-CM | POA: Diagnosis not present

## 2023-05-29 DIAGNOSIS — R739 Hyperglycemia, unspecified: Secondary | ICD-10-CM

## 2023-05-29 DIAGNOSIS — E538 Deficiency of other specified B group vitamins: Secondary | ICD-10-CM

## 2023-05-29 DIAGNOSIS — E785 Hyperlipidemia, unspecified: Secondary | ICD-10-CM

## 2023-05-29 DIAGNOSIS — Z0001 Encounter for general adult medical examination with abnormal findings: Secondary | ICD-10-CM

## 2023-05-29 DIAGNOSIS — Z1211 Encounter for screening for malignant neoplasm of colon: Secondary | ICD-10-CM

## 2023-05-29 LAB — HEPATIC FUNCTION PANEL
ALT: 25 U/L (ref 0–53)
AST: 17 U/L (ref 0–37)
Albumin: 4.2 g/dL (ref 3.5–5.2)
Alkaline Phosphatase: 82 U/L (ref 39–117)
Bilirubin, Direct: 0.1 mg/dL (ref 0.0–0.3)
Total Bilirubin: 0.6 mg/dL (ref 0.2–1.2)
Total Protein: 7 g/dL (ref 6.0–8.3)

## 2023-05-29 LAB — HEMOGLOBIN A1C: Hgb A1c MFr Bld: 5.8 % (ref 4.6–6.5)

## 2023-05-29 LAB — CBC WITH DIFFERENTIAL/PLATELET
Basophils Absolute: 0 10*3/uL (ref 0.0–0.1)
Basophils Relative: 0.8 % (ref 0.0–3.0)
Eosinophils Absolute: 0.1 10*3/uL (ref 0.0–0.7)
Eosinophils Relative: 1.5 % (ref 0.0–5.0)
HCT: 48.1 % (ref 39.0–52.0)
Hemoglobin: 16.1 g/dL (ref 13.0–17.0)
Lymphocytes Relative: 40.6 % (ref 12.0–46.0)
Lymphs Abs: 2.5 10*3/uL (ref 0.7–4.0)
MCHC: 33.6 g/dL (ref 30.0–36.0)
MCV: 93 fL (ref 78.0–100.0)
Monocytes Absolute: 0.5 10*3/uL (ref 0.1–1.0)
Monocytes Relative: 7.3 % (ref 3.0–12.0)
Neutro Abs: 3 10*3/uL (ref 1.4–7.7)
Neutrophils Relative %: 49.8 % (ref 43.0–77.0)
Platelets: 186 10*3/uL (ref 150.0–400.0)
RBC: 5.17 Mil/uL (ref 4.22–5.81)
RDW: 12.8 % (ref 11.5–15.5)
WBC: 6.1 10*3/uL (ref 4.0–10.5)

## 2023-05-29 LAB — BASIC METABOLIC PANEL
BUN: 9 mg/dL (ref 6–23)
CO2: 30 meq/L (ref 19–32)
Calcium: 9.5 mg/dL (ref 8.4–10.5)
Chloride: 104 meq/L (ref 96–112)
Creatinine, Ser: 1.1 mg/dL (ref 0.40–1.50)
GFR: 79.98 mL/min (ref >60.00)
Glucose, Bld: 86 mg/dL (ref 70–99)
Potassium: 4 meq/L (ref 3.5–5.1)
Sodium: 144 meq/L (ref 135–145)

## 2023-05-29 LAB — PSA: PSA: 2.23 ng/mL (ref 0.10–4.00)

## 2023-05-29 LAB — TSH: TSH: 1.02 u[IU]/mL (ref 0.35–5.50)

## 2023-05-29 MED ORDER — FOLIC ACID 1 MG PO TABS: 1.0000 mg | ORAL_TABLET | Freq: Every day | ORAL | 1 refills | Status: DC

## 2023-05-29 NOTE — Progress Notes (Unsigned)
Subjective:  Patient ID: Curtis Townsend, male    DOB: December 18, 1975  Age: 47 y.o. MRN: [id number]  CC: Annual Exam   HPI Curtis Townsend presents for a CPX and f/up ---  Discussed the use of AI scribe software for clinical note transcription with the patient, who gave verbal consent to proceed.  History of Present Illness   The patient, with a history of folic acid deficiency, presents with complaints of fatigue that has been worsening over time. He denies any associated chest pain, shortness of breath, dizziness, or lightheadedness. The patient admits to not being compliant with his folic acid medication, which was prescribed a year ago for his deficiency. He denies any impact of this non-compliance on his health.  The patient's weight has been stable for the past two years and he reports no issues with bowel movements. He is currently not working due to disability. He has a smoking history of about two years, with a quarter pack a day habit, but denies any smoking-related symptoms such as coughing or wheezing.  The patient is on B12 injections, which he receives once a month, with the last one administered earlier in the month. He also reports taking Abilify, but admits to not taking his prescribed Cogentin (Benztropine) and Vitamin B1. He denies any feelings of depression, anxiety, or dangerous thoughts.  The patient has declined a flu shot and has agreed to undergo an EKG and lab work. His blood pressure at the time of the consultation was 116/76.   He is not taking the statin.      Outpatient Medications Prior to Visit  Medication Sig Dispense Refill   ARIPiprazole ER (ABILIFY ASIMTUFII) 960 MG/3.2ML PRSY Inject 960 mg into the muscle every 2 (two) months. 1 mL 5   benztropine (COGENTIN) 0.5 MG tablet Take 1 tablet (0.5 mg total) by mouth at bedtime. (Patient not taking: Reported on 05/29/2023) 30 tablet 2   Cholecalciferol (VITAMIN D3) 1.25 MG (50000 UT) CAPS TAKE 1 CAPSULE BY  MOUTH ONE TIME PER WEEK (Patient not taking: Reported on 05/29/2023) 12 capsule 0   ketoconazole (NIZORAL) 2 % shampoo APPLY TOPICALLY 2 TIMES A WEEK. (Patient not taking: Reported on 05/29/2023) 120 mL 2   folic acid (FOLVITE) 1 MG tablet TAKE 1 TABLET BY MOUTH EVERY DAY 90 tablet 1   rosuvastatin (CRESTOR) 20 MG tablet Take 1 tablet (20 mg total) by mouth daily. (Patient not taking: Reported on 05/29/2023) 90 tablet 1   Facility-Administered Medications Prior to Visit  Medication Dose Route Frequency Provider Last Rate Last Admin   cyanocobalamin ((VITAMIN B-12)) injection 1,000 mcg  1,000 mcg Intramuscular Q30 days Etta Grandchild, MD   1,000 mcg at 05/17/23 1021    ROS Review of Systems  Constitutional:  Positive for fatigue. Negative for appetite change, diaphoresis and unexpected weight change.  HENT: Negative.    Eyes: Negative.   Respiratory: Negative.  Negative for cough, chest tightness, shortness of breath and wheezing.   Cardiovascular:  Negative for chest pain, palpitations and leg swelling.  Gastrointestinal:  Negative for abdominal pain, diarrhea, nausea and vomiting.  Endocrine: Negative.   Genitourinary: Negative.  Negative for difficulty urinating and dysuria.  Musculoskeletal: Negative.  Negative for arthralgias and myalgias.  Skin: Negative.  Negative for color change.  Neurological: Negative.   Hematological:  Negative for adenopathy. Does not bruise/bleed easily.  Psychiatric/Behavioral:  Positive for dysphoric mood. Negative for confusion, hallucinations, self-injury, sleep disturbance and suicidal ideas. The patient is  not nervous/anxious.     Objective:  BP 116/76 (BP Location: Left Arm, Patient Position: Sitting, Cuff Size: Normal)   Pulse 86   Temp 98.4 F (36.9 C) (Oral)   Resp 16   Ht 6\' 1"  (1.854 m)   Wt 215 lb 12.8 oz (97.9 kg)   SpO2 96%   BMI 28.47 kg/m   BP Readings from Last 3 Encounters:  05/29/23 116/76  08/22/22 118/74  10/20/21 112/60     Wt Readings from Last 3 Encounters:  05/29/23 215 lb 12.8 oz (97.9 kg)  08/22/22 214 lb (97.1 kg)  10/20/21 214 lb (97.1 kg)    Physical Exam Vitals reviewed.  Constitutional:      Appearance: He is obese.  Eyes:     General: No scleral icterus.    Conjunctiva/sclera: Conjunctivae normal.  Cardiovascular:     Rate and Rhythm: Normal rate and regular rhythm.     Heart sounds: No murmur heard.    No friction rub. No gallop.     Comments: EKG- NSR, 77 bpm NS T wave changes No Q waves Pulmonary:     Effort: Pulmonary effort is normal.     Breath sounds: No stridor. No wheezing, rhonchi or rales.  Abdominal:     General: Abdomen is flat.     Palpations: There is no mass.     Tenderness: There is no abdominal tenderness. There is no guarding.     Hernia: No hernia is present.  Musculoskeletal:     Cervical back: Neck supple.     Right lower leg: No edema.     Left lower leg: No edema.  Lymphadenopathy:     Cervical: No cervical adenopathy.  Skin:    General: Skin is warm and dry.     Findings: No rash.  Neurological:     General: No focal deficit present.     Mental Status: He is alert.  Psychiatric:        Mood and Affect: Mood normal.        Behavior: Behavior normal.     Lab Results  Component Value Date   WBC 6.1 05/29/2023   HGB 16.1 05/29/2023   HCT 48.1 05/29/2023   PLT 186.0 05/29/2023   GLUCOSE 86 05/29/2023   CHOL 280 (H) 08/22/2022   TRIG 110.0 08/22/2022   HDL 50.10 08/22/2022   LDLCALC 207 (H) 08/22/2022   ALT 25 05/29/2023   AST 17 05/29/2023   NA 144 05/29/2023   K 4.0 05/29/2023   CL 104 05/29/2023   CREATININE 1.10 05/29/2023   BUN 9 05/29/2023   CO2 30 05/29/2023   TSH 1.02 05/29/2023   PSA 2.23 05/29/2023   HGBA1C 5.8 05/29/2023    CT ABDOMEN PELVIS WO CONTRAST  Result Date: 12/29/2020 CLINICAL DATA:  Right flank pain and hematuria. EXAM: CT ABDOMEN AND PELVIS WITHOUT CONTRAST TECHNIQUE: Multidetector CT imaging of the  abdomen and pelvis was performed following the standard protocol without IV contrast. COMPARISON:  11/03/2017 FINDINGS: Lower chest: Minimal streaky dependent subpleural atelectasis but no infiltrates or effusions. The heart is normal in size. No pericardial effusion. The distal esophagus is normal. Hepatobiliary: No hepatic lesions or intrahepatic biliary dilatation. The gallbladder is unremarkable. No common bile duct dilatation. Pancreas: No mass, inflammation or ductal dilatation. Spleen: Normal size.  No focal lesions. Adrenals/Urinary Tract: The adrenal glands are unremarkable. Small lower pole left renal calculus. No right-sided renal calculi. There is mild right hydroureteronephrosis down to an obstructing 3  mm calculus in the upper right ureter located at the L3-4 disc space level. No left-sided ureteral calculi. No bladder calculi. No worrisome renal or bladder lesions without contrast. Down to an obstructing Stomach/Bowel: The stomach, duodenum, small bowel and colon are grossly normal without oral contrast. No inflammatory changes, mass lesions or obstructive findings. The appendix is normal. Vascular/Lymphatic: The aorta is normal in caliber. No atheroscerlotic calcifications. No mesenteric of retroperitoneal mass or adenopathy. Small scattered lymph nodes are noted. Reproductive: The prostate gland and seminal vesicles are unremarkable. Other: No pelvic mass or adenopathy. No free pelvic fluid collections. No inguinal mass or adenopathy. No abdominal wall hernia or subcutaneous lesions. Musculoskeletal: No significant bony findings. IMPRESSION: 1. 3 mm upper right ureteral calculus causing mild right-sided hydroureteronephrosis. 2. Small lower pole left renal calculus. 3. No other significant abdominal/pelvic findings, mass lesions or adenopathy. Electronically Signed   By: Rudie Meyer M.D.   On: 12/29/2020 08:17    Assessment & Plan:  Vitamin B12 deficiency neuropathy (HCC) -     CBC with  Differential/Platelet; Future  Dietary folate deficiency -     CBC with Differential/Platelet; Future -     Folic Acid; Take 1 tablet (1 mg total) by mouth daily. (Patient not taking: Reported on 05/29/2023)  Dispense: 90 tablet; Refill: 1  Chronic hyperglycemia -     Basic metabolic panel; Future -     Hemoglobin A1c; Future  Colon cancer screening -     Ambulatory referral to Gastroenterology  Fatigue, unspecified type -     EKG 12-Lead -     TSH; Future -     Hepatic function panel; Future  Encounter for general adult medical examination with abnormal findings -     PSA; Future  Dyslipidemia, goal LDL below 130 -     Hepatic function panel; Future     Follow-up: Return in about 6 months (around 11/26/2023).  Sanda Linger, MD

## 2023-05-29 NOTE — Patient Instructions (Signed)
Health Maintenance, Male Adopting a healthy lifestyle and getting preventive care are important in promoting health and wellness. Ask your health care provider about: The right schedule for you to have regular tests and exams. Things you can do on your own to prevent diseases and keep yourself healthy. What should I know about diet, weight, and exercise? Eat a healthy diet  Eat a diet that includes plenty of vegetables, fruits, low-fat dairy products, and lean protein. Do not eat a lot of foods that are high in solid fats, added sugars, or sodium. Maintain a healthy weight Body mass index (BMI) is a measurement that can be used to identify possible weight problems. It estimates body fat based on height and weight. Your health care provider can help determine your BMI and help you achieve or maintain a healthy weight. Get regular exercise Get regular exercise. This is one of the most important things you can do for your health. Most adults should: Exercise for at least 150 minutes each week. The exercise should increase your heart rate and make you sweat (moderate-intensity exercise). Do strengthening exercises at least twice a week. This is in addition to the moderate-intensity exercise. Spend less time sitting. Even light physical activity can be beneficial. Watch cholesterol and blood lipids Have your blood tested for lipids and cholesterol at 47 years of age, then have this test every 5 years. You may need to have your cholesterol levels checked more often if: Your lipid or cholesterol levels are high. You are older than 47 years of age. You are at high risk for heart disease. What should I know about cancer screening? Many types of cancers can be detected early and may often be prevented. Depending on your health history and family history, you may need to have cancer screening at various ages. This may include screening for: Colorectal cancer. Prostate cancer. Skin cancer. Lung  cancer. What should I know about heart disease, diabetes, and high blood pressure? Blood pressure and heart disease High blood pressure causes heart disease and increases the risk of stroke. This is more likely to develop in people who have high blood pressure readings or are overweight. Talk with your health care provider about your target blood pressure readings. Have your blood pressure checked: Every 3-5 years if you are 18-39 years of age. Every year if you are 40 years old or older. If you are between the ages of 65 and 75 and are a current or former smoker, ask your health care provider if you should have a one-time screening for abdominal aortic aneurysm (AAA). Diabetes Have regular diabetes screenings. This checks your fasting blood sugar level. Have the screening done: Once every three years after age 45 if you are at a normal weight and have a low risk for diabetes. More often and at a younger age if you are overweight or have a high risk for diabetes. What should I know about preventing infection? Hepatitis B If you have a higher risk for hepatitis B, you should be screened for this virus. Talk with your health care provider to find out if you are at risk for hepatitis B infection. Hepatitis C Blood testing is recommended for: Everyone born from 1945 through 1965. Anyone with known risk factors for hepatitis C. Sexually transmitted infections (STIs) You should be screened each year for STIs, including gonorrhea and chlamydia, if: You are sexually active and are younger than 47 years of age. You are older than 47 years of age and your   health care provider tells you that you are at risk for this type of infection. Your sexual activity has changed since you were last screened, and you are at increased risk for chlamydia or gonorrhea. Ask your health care provider if you are at risk. Ask your health care provider about whether you are at high risk for HIV. Your health care provider  may recommend a prescription medicine to help prevent HIV infection. If you choose to take medicine to prevent HIV, you should first get tested for HIV. You should then be tested every 3 months for as long as you are taking the medicine. Follow these instructions at home: Alcohol use Do not drink alcohol if your health care provider tells you not to drink. If you drink alcohol: Limit how much you have to 0-2 drinks a day. Know how much alcohol is in your drink. In the [place] one drink equals one 12 oz bottle of beer (355 mL), one 5 oz glass of wine (148 mL), or one 1 oz glass of hard liquor (44 mL). Lifestyle Do not use any products that contain nicotine or tobacco. These products include cigarettes, chewing tobacco, and vaping devices, such as e-cigarettes. If you need help quitting, ask your health care provider. Do not use street drugs. Do not share needles. Ask your health care provider for help if you need support or information about quitting drugs. General instructions Schedule regular health, dental, and eye exams. Stay current with your vaccines. Tell your health care provider if: You often feel depressed. You have ever been abused or do not feel safe at home. Summary Adopting a healthy lifestyle and getting preventive care are important in promoting health and wellness. Follow your health care provider's instructions about healthy diet, exercising, and getting tested or screened for diseases. Follow your health care provider's instructions on monitoring your cholesterol and blood pressure. This information is not intended to replace advice given to you by your health care provider. Make sure you discuss any questions you have with your health care provider. Document Revised: 11/15/2020 Document Reviewed: 11/15/2020 Elsevier Patient Education  2024 Elsevier Inc.  

## 2023-05-30 MED ORDER — ROSUVASTATIN CALCIUM 20 MG PO TABS: 20.0000 mg | ORAL_TABLET | Freq: Every day | ORAL | 1 refills | Status: DC

## 2023-06-04 ENCOUNTER — Telehealth: Payer: Self-pay

## 2023-06-04 NOTE — Progress Notes (Signed)
   Care Guide Note  06/04/2023 Name: Curtis Townsend MRN: [id number] DOB: 01-15-1976  Referred by: Etta Grandchild, MD Reason for referral : Care Coordination (Outreach to schedule with Pharm d )   Curtis Townsend is a 47 y.o. year old male who is a primary care patient of Etta Grandchild, MD. Ansel Bong was referred to the pharmacist for assistance related to  LDL .    Successful contact was made with the patient to discuss pharmacy services. Patient declines engagement at this time. Contact information was provided to the patient should they wish to reach out for assistance at a later time. Penne Lash , RMA     [facility]  [facility] Guide  Direct Dial: [phone]  Website: [email or web address]

## 2023-06-18 ENCOUNTER — Ambulatory Visit (INDEPENDENT_AMBULATORY_CARE_PROVIDER_SITE_OTHER): Payer: Federal, State, Local not specified - PPO

## 2023-06-18 DIAGNOSIS — E538 Deficiency of other specified B group vitamins: Secondary | ICD-10-CM | POA: Diagnosis not present

## 2023-06-18 MED ORDER — CYANOCOBALAMIN 1000 MCG/ML IJ SOLN
1000.0000 ug | Freq: Once | INTRAMUSCULAR | Status: AC
  Administered 2023-06-18: 1000 ug via INTRAMUSCULAR

## 2023-06-18 NOTE — Progress Notes (Signed)
Pt was given B12 injection with no complications.

## 2023-07-19 ENCOUNTER — Ambulatory Visit (INDEPENDENT_AMBULATORY_CARE_PROVIDER_SITE_OTHER): Payer: Federal, State, Local not specified - PPO

## 2023-07-19 DIAGNOSIS — E538 Deficiency of other specified B group vitamins: Secondary | ICD-10-CM | POA: Diagnosis not present

## 2023-07-19 DIAGNOSIS — G63 Polyneuropathy in diseases classified elsewhere: Secondary | ICD-10-CM | POA: Diagnosis not present

## 2023-07-19 MED ORDER — CYANOCOBALAMIN 1000 MCG/ML IJ SOLN
1000.0000 ug | Freq: Once | INTRAMUSCULAR | Status: AC
  Administered 2023-07-19: 1000 ug via INTRAMUSCULAR

## 2023-07-19 NOTE — Progress Notes (Signed)
 Patient visits today to receive her B12 Injection. Patient was informed and tolerated well. Patient was notified to reach out to [name] if needed.

## 2023-07-27 ENCOUNTER — Ambulatory Visit (HOSPITAL_COMMUNITY): Payer: Federal, State, Local not specified - PPO

## 2023-07-30 ENCOUNTER — Ambulatory Visit (HOSPITAL_COMMUNITY): Payer: Federal, State, Local not specified - PPO | Admitting: *Deleted

## 2023-07-30 ENCOUNTER — Telehealth (HOSPITAL_COMMUNITY): Payer: Self-pay | Admitting: *Deleted

## 2023-07-30 VITALS — BP 107/76 | HR 90 | Resp 18 | Ht 73.0 in | Wt 224.4 lb

## 2023-07-30 DIAGNOSIS — F314 Bipolar disorder, current episode depressed, severe, without psychotic features: Secondary | ICD-10-CM

## 2023-07-30 DIAGNOSIS — F319 Bipolar disorder, unspecified: Secondary | ICD-10-CM

## 2023-07-30 DIAGNOSIS — F4310 Post-traumatic stress disorder, unspecified: Secondary | ICD-10-CM

## 2023-07-30 MED ORDER — ABILIFY ASIMTUFII 960 MG/3.2ML IM PRSY: 960.0000 mg | PREFILLED_SYRINGE | INTRAMUSCULAR | 5 refills | Status: DC

## 2023-07-30 NOTE — Telephone Encounter (Signed)
Pt came to clinic today for due Abilify Asimtuffi 960mg /3.23ml.Pt is baseline flat in affect with very minimal eye contact (no dark glasses today), however is cooperative and pleasant on approach. Hygiene seems better than on previous visits. No tremor noted in R hand. Pt denies any lability, AVH. Or SI since last injection and would like to stay on this 2 month schedule for the LAI. Injection was prepared and given as ordered. Administered in LUOQ. Pt will return in 2 months for next due injection. Pt is encouraged to contact us with any questions or concerns.

## 2023-08-08 ENCOUNTER — Encounter (HOSPITAL_COMMUNITY): Payer: Self-pay | Admitting: Psychiatry

## 2023-08-08 ENCOUNTER — Telehealth (HOSPITAL_COMMUNITY): Payer: Federal, State, Local not specified - PPO | Admitting: Psychiatry

## 2023-08-08 VITALS — Wt 215.0 lb

## 2023-08-08 DIAGNOSIS — F4310 Post-traumatic stress disorder, unspecified: Secondary | ICD-10-CM

## 2023-08-08 DIAGNOSIS — F319 Bipolar disorder, unspecified: Secondary | ICD-10-CM

## 2023-08-08 MED ORDER — ABILIFY ASIMTUFII 960 MG/3.2ML IM PRSY: 960.0000 mg | PREFILLED_SYRINGE | INTRAMUSCULAR | 5 refills | Status: DC
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
## 2023-08-20 ENCOUNTER — Ambulatory Visit (INDEPENDENT_AMBULATORY_CARE_PROVIDER_SITE_OTHER): Payer: Federal, State, Local not specified - PPO

## 2023-08-20 DIAGNOSIS — E538 Deficiency of other specified B group vitamins: Secondary | ICD-10-CM

## 2023-08-20 MED ORDER — CYANOCOBALAMIN 1000 MCG/ML IJ SOLN
1000.0000 ug | Freq: Once | INTRAMUSCULAR | Status: AC
  Administered 2023-08-20: 1000 ug via INTRAMUSCULAR

## 2023-08-20 NOTE — Progress Notes (Signed)
After obtaining consent, and per orders of Dr. Jones, injection of B12 was given by Hayward Rylander P Lior Hoen. Patient instructed to report any adverse reaction to me immediately.  

## 2023-09-10 ENCOUNTER — Ambulatory Visit (HOSPITAL_COMMUNITY): Payer: Federal, State, Local not specified - PPO

## 2023-09-11 DIAGNOSIS — H401114 Primary open-angle glaucoma, right eye, indeterminate stage: Secondary | ICD-10-CM | POA: Diagnosis not present

## 2023-09-11 DIAGNOSIS — H2513 Age-related nuclear cataract, bilateral: Secondary | ICD-10-CM | POA: Diagnosis not present

## 2023-09-11 DIAGNOSIS — H401124 Primary open-angle glaucoma, left eye, indeterminate stage: Secondary | ICD-10-CM | POA: Diagnosis not present

## 2023-09-17 ENCOUNTER — Ambulatory Visit (INDEPENDENT_AMBULATORY_CARE_PROVIDER_SITE_OTHER): Payer: Federal, State, Local not specified - PPO

## 2023-09-17 DIAGNOSIS — E538 Deficiency of other specified B group vitamins: Secondary | ICD-10-CM

## 2023-09-17 MED ORDER — CYANOCOBALAMIN 1000 MCG/ML IJ SOLN
1000.0000 ug | Freq: Once | INTRAMUSCULAR | Status: AC
  Administered 2023-09-17: 1000 ug via INTRAMUSCULAR

## 2023-09-17 NOTE — Progress Notes (Signed)
 Patient visits today for their b-12 injection. Patient informed of what they had received and tolerated injection well. Patient notified to reach out to office if needed.

## 2023-09-24 ENCOUNTER — Ambulatory Visit (HOSPITAL_BASED_OUTPATIENT_CLINIC_OR_DEPARTMENT_OTHER): Payer: Federal, State, Local not specified - PPO

## 2023-09-24 VITALS — BP 127/79 | HR 110 | Ht 74.0 in | Wt 228.0 lb

## 2023-09-24 DIAGNOSIS — F319 Bipolar disorder, unspecified: Secondary | ICD-10-CM

## 2023-09-24 MED ORDER — ARIPIPRAZOLE ER 960 MG/3.2ML IM PRSY
960.0000 mg | PREFILLED_SYRINGE | INTRAMUSCULAR | Status: AC
  Administered 2023-09-24 – 2024-07-31 (×6): 960 mg via INTRAMUSCULAR

## 2023-09-24 NOTE — Progress Notes (Signed)
 Patient arrives today for his Abilify Asimtufii 960 due injection. Patient is well groomed and pleasant upon approach. Patient does not like getting the shot in his glut and I explained again why this is the only option for this injection. Patient denies any SI/HI or AVH. Patient says that the medication seems to be lasting for him. Injection was prepared and administered in the LUOQ as ordered, patient tolerated well and without complaint. Patient will return in 2 months     NDC: 16109-604-54 LOT: [id number]  EXP: MAY 2026

## 2023-10-09 ENCOUNTER — Other Ambulatory Visit (HOSPITAL_COMMUNITY): Payer: Self-pay | Admitting: Psychiatry

## 2023-10-09 ENCOUNTER — Other Ambulatory Visit: Payer: Self-pay | Admitting: Internal Medicine

## 2023-10-09 DIAGNOSIS — F4310 Post-traumatic stress disorder, unspecified: Secondary | ICD-10-CM

## 2023-10-09 DIAGNOSIS — R251 Tremor, unspecified: Secondary | ICD-10-CM

## 2023-10-09 DIAGNOSIS — E785 Hyperlipidemia, unspecified: Secondary | ICD-10-CM

## 2023-10-09 DIAGNOSIS — F319 Bipolar disorder, unspecified: Secondary | ICD-10-CM

## 2023-10-18 ENCOUNTER — Ambulatory Visit (INDEPENDENT_AMBULATORY_CARE_PROVIDER_SITE_OTHER)

## 2023-10-18 DIAGNOSIS — E538 Deficiency of other specified B group vitamins: Secondary | ICD-10-CM | POA: Diagnosis not present

## 2023-10-18 MED ORDER — CYANOCOBALAMIN 1000 MCG/ML IJ SOLN
1000.0000 ug | Freq: Once | INTRAMUSCULAR | Status: AC
  Administered 2023-10-18: 1000 ug via INTRAMUSCULAR

## 2023-10-18 NOTE — Progress Notes (Signed)
 Patient visits today for their b-12 injection. Patient informed of what they had received and tolerated injection well. Patient notified to reach out to office if needed.

## 2023-11-06 ENCOUNTER — Telehealth (HOSPITAL_COMMUNITY): Payer: Federal, State, Local not specified - PPO | Admitting: Psychiatry

## 2023-11-06 ENCOUNTER — Other Ambulatory Visit: Payer: Self-pay

## 2023-11-06 ENCOUNTER — Encounter (HOSPITAL_COMMUNITY): Payer: Self-pay | Admitting: Psychiatry

## 2023-11-06 DIAGNOSIS — F319 Bipolar disorder, unspecified: Secondary | ICD-10-CM

## 2023-11-06 DIAGNOSIS — F4310 Post-traumatic stress disorder, unspecified: Secondary | ICD-10-CM | POA: Diagnosis not present

## 2023-11-06 MED ORDER — ABILIFY ASIMTUFII 960 MG/3.2ML IM PRSY: 960.0000 mg | PREFILLED_SYRINGE | INTRAMUSCULAR | 5 refills | Status: DC
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
## 2023-11-15 ENCOUNTER — Ambulatory Visit

## 2023-11-22 ENCOUNTER — Ambulatory Visit (INDEPENDENT_AMBULATORY_CARE_PROVIDER_SITE_OTHER)

## 2023-11-22 DIAGNOSIS — E538 Deficiency of other specified B group vitamins: Secondary | ICD-10-CM

## 2023-11-22 MED ORDER — CYANOCOBALAMIN 1000 MCG/ML IJ SOLN
1000.0000 ug | Freq: Once | INTRAMUSCULAR | Status: AC
  Administered 2023-11-22: 1000 ug via INTRAMUSCULAR

## 2023-11-22 NOTE — Progress Notes (Signed)
Pt was given B12 injection with no complications.

## 2023-11-26 ENCOUNTER — Ambulatory Visit (HOSPITAL_BASED_OUTPATIENT_CLINIC_OR_DEPARTMENT_OTHER)

## 2023-11-26 VITALS — BP 113/75 | HR 76 | Ht 73.0 in | Wt 219.0 lb

## 2023-11-26 DIAGNOSIS — F319 Bipolar disorder, unspecified: Secondary | ICD-10-CM

## 2023-11-26 NOTE — Progress Notes (Signed)
 Patient arrives today for his due injection of Abilify  Asimtufii 960 mg / 3.2 mL. Patient presents well groomed with a flat affect. Patient states he has to go home and take care of his wife, she is not feeling well today. Patient denies SI/HI or AVH. Injection was prepared as ordered and administered in patients RUOQ. Patient tolerated well and without complaint.     NDC: 40981-191-47 LOT: [id number]  EXP: [id number]

## 2023-11-29 ENCOUNTER — Ambulatory Visit (INDEPENDENT_AMBULATORY_CARE_PROVIDER_SITE_OTHER): Admitting: Licensed Clinical Social Worker

## 2023-11-29 DIAGNOSIS — F250 Schizoaffective disorder, bipolar type: Secondary | ICD-10-CM | POA: Diagnosis not present

## 2023-12-03 ENCOUNTER — Encounter (HOSPITAL_COMMUNITY): Payer: Self-pay | Admitting: Licensed Clinical Social Worker

## 2023-12-03 NOTE — Progress Notes (Signed)
 Virtual Visit via Video Note  I connected with Curtis Townsend on 12/03/23 at  4:30 PM EDT by a video enabled telemedicine application and verified that I am speaking with the correct person using two identifiers.  Location: Patient: home Provider: home office   I discussed the limitations of evaluation and management by telemedicine and the availability of in person appointments. The patient expressed understanding and agreed to proceed.   I discussed the assessment and treatment plan with the patient. The patient was provided an opportunity to ask questions and all were answered. The patient agreed with the plan and demonstrated an understanding of the instructions.   The patient was advised to call back or seek an in-person evaluation if the symptoms worsen or if the condition fails to improve as anticipated.  I provided 45 minutes of non-face-to-face time during this encounter.   Seldon Dago, LCSW   THERAPIST PROGRESS NOTE  Session Time: 4:30pm-5:15pm  Participation Level: Active  Behavioral Response: NeatAlertAnxious  Type of Therapy: Individual Therapy  Treatment Goals addressed:  LTG: Reduce frequency, intensity, and duration of depression symptoms so that daily functioning is improved  LTG: Increase coping skills to manage depression and improve ability to perform daily activities  ProgressTowards Goals: Progressing  Interventions: CBT  Summary: Curtis Townsend is a 48 y.o. male who presents with Schizoaffective Disorder, mixed type.   Suicidal/Homicidal: Nowithout intent/plan  Therapist Response: Tayden engaged well in individual virtual session with clinician. Clinician utilized CBT to process triggers to current anxiety levels, thoughts, feelings, and behaviors. Garey shared worries about housing, noting that the owner of his rental home is considering selling the property. Clinician processed options: move or buy. Clinician explored thoughts,  feelings, wants, and abilities. Clinician discussed communication with wife and what she wants to do. Clinician processed best case scenario, worst case scenario, and what is most likely. Clinician assisted in planning for all events in order to be able to manage the stress.   Plan: Return again in 2-4 weeks.  Diagnosis: Schizoaffective disorder, mixed type (HCC)  Collaboration of Care: Psychiatrist AEB reviewed notes from Dr. Carlos Chesterfield  Patient/Guardian was advised Release of Information must be obtained prior to any record release in order to collaborate their care with an outside provider. Patient/Guardian was advised if they have not already done so to contact the registration department to sign all necessary forms in order for us  to release information regarding their care.   Consent: Patient/Guardian gives verbal consent for treatment and assignment of benefits for services provided during this visit. Patient/Guardian expressed understanding and agreed to proceed.   Merleen Stare [place], LCSW 12/03/2023

## 2023-12-24 ENCOUNTER — Ambulatory Visit (INDEPENDENT_AMBULATORY_CARE_PROVIDER_SITE_OTHER)

## 2023-12-24 DIAGNOSIS — E538 Deficiency of other specified B group vitamins: Secondary | ICD-10-CM

## 2023-12-24 MED ORDER — CYANOCOBALAMIN 1000 MCG/ML IJ SOLN
100.0000 ug | Freq: Once | INTRAMUSCULAR | Status: AC
  Administered 2023-12-24: 100 ug via INTRAMUSCULAR

## 2023-12-24 NOTE — Progress Notes (Signed)
 Pt here for monthly B12 injection per Dr.Jones  B12 1000mcg given IM and pt tolerated injection well.  Next B12 injection scheduled on his way out

## 2023-12-27 ENCOUNTER — Ambulatory Visit (INDEPENDENT_AMBULATORY_CARE_PROVIDER_SITE_OTHER): Admitting: Licensed Clinical Social Worker

## 2023-12-27 DIAGNOSIS — F250 Schizoaffective disorder, bipolar type: Secondary | ICD-10-CM | POA: Diagnosis not present

## 2024-01-06 ENCOUNTER — Encounter (HOSPITAL_COMMUNITY): Payer: Self-pay | Admitting: Licensed Clinical Social Worker

## 2024-01-06 NOTE — Progress Notes (Signed)
 Virtual Visit via Video Note  I connected with Curtis Townsend on 12/27/23 at  4:30 PM EDT by a video enabled telemedicine application and verified that I am speaking with the correct person using two identifiers.  Location: Patient: home Provider: home office   I discussed the limitations of evaluation and management by telemedicine and the availability of in person appointments. The patient expressed understanding and agreed to proceed.   I discussed the assessment and treatment plan with the patient. The patient was provided an opportunity to ask questions and all were answered. The patient agreed with the plan and demonstrated an understanding of the instructions.   The patient was advised to call back or seek an in-person evaluation if the symptoms worsen or if the condition fails to improve as anticipated.  I provided 30 minutes of non-face-to-face time during this encounter.   Harlene JONELLE Rosser, LCSW   THERAPIST PROGRESS NOTE  Session Time: 4:30pm-5:00pm  Participation Level: Active  Behavioral Response: Well GroomedAlertEuthymic  Type of Therapy: Individual Therapy  Treatment Goals addressed:  LTG: Reduce frequency, intensity, and duration of depression symptoms so that daily functioning is improved  LTG: Increase coping skills to manage depression and improve ability to perform daily activities  ProgressTowards Goals: Progressing  Interventions: Motivational Interviewing  Summary: Curtis Townsend is a 48 y.o. male who presents with Schizoaffective Disorder, mixed type.   Suicidal/Homicidal: Nowithout intent/plan  Therapist Response: Trea engaged well in individual virtual session with clinician. Clinician utilized MI OARS to reflect and summarize thoughts, feelings, and interactions. Clinician identified improvement in stress levels about housing following a productive and reassuring conversation with wife. Clinician explored intrusive thoughts and noted  improvement with this. Clinician processed coping skills and noted that with children home for the summer, things are more relaxed and he is feeling much better about the future.  Plan: Return again in 4 weeks.  Diagnosis: Schizoaffective disorder, mixed type (HCC)  Collaboration of Care: Patient refused AEB none required  Patient/Guardian was advised Release of Information must be obtained prior to any record release in order to collaborate their care with an outside provider. Patient/Guardian was advised if they have not already done so to contact the registration department to sign all necessary forms in order for us  to release information regarding their care.   Consent: Patient/Guardian gives verbal consent for treatment and assignment of benefits for services provided during this visit. Patient/Guardian expressed understanding and agreed to proceed.   Harlene JONELLE [place], LCSW 01/06/2024

## 2024-01-21 ENCOUNTER — Ambulatory Visit

## 2024-01-22 ENCOUNTER — Other Ambulatory Visit

## 2024-01-23 ENCOUNTER — Ambulatory Visit (INDEPENDENT_AMBULATORY_CARE_PROVIDER_SITE_OTHER)

## 2024-01-23 DIAGNOSIS — E538 Deficiency of other specified B group vitamins: Secondary | ICD-10-CM | POA: Diagnosis not present

## 2024-01-23 MED ORDER — CYANOCOBALAMIN 1000 MCG/ML IJ SOLN
1000.0000 ug | Freq: Once | INTRAMUSCULAR | Status: AC
Start: 2024-01-23 — End: 2024-01-23
  Administered 2024-01-23: 1000 ug via INTRAMUSCULAR

## 2024-01-23 NOTE — Progress Notes (Signed)
 After obtaining consent, and per orders of Dr. Yetta Barre, injection of B12 given by Ferdie Ping. Patient instructed to report any adverse reaction to me immediately.

## 2024-01-28 ENCOUNTER — Ambulatory Visit (HOSPITAL_COMMUNITY)

## 2024-01-31 ENCOUNTER — Ambulatory Visit (HOSPITAL_BASED_OUTPATIENT_CLINIC_OR_DEPARTMENT_OTHER)

## 2024-01-31 ENCOUNTER — Encounter (HOSPITAL_COMMUNITY): Payer: Self-pay

## 2024-01-31 VITALS — BP 126/78 | HR 90 | Ht 73.0 in | Wt 220.0 lb

## 2024-01-31 DIAGNOSIS — F319 Bipolar disorder, unspecified: Secondary | ICD-10-CM

## 2024-01-31 DIAGNOSIS — F250 Schizoaffective disorder, bipolar type: Secondary | ICD-10-CM

## 2024-01-31 NOTE — Progress Notes (Signed)
 Patient arrives today for his due injection of Abilify  Asimtufii 960 mg / 3.2 mL. Patient presents well groomed with a flat affect. Patient states he is doing well, he is not liking all this rain. Patient denies SI/HI or AVH. Injection was prepared as ordered and administered in patients LUOQ. Patient tolerated well and without complaint.    NDC: 40851-885-19 LOT: [id number] EXP: [id number]

## 2024-02-07 ENCOUNTER — Ambulatory Visit (HOSPITAL_COMMUNITY): Admitting: Licensed Clinical Social Worker

## 2024-02-07 ENCOUNTER — Encounter (HOSPITAL_COMMUNITY): Payer: Self-pay | Admitting: Licensed Clinical Social Worker

## 2024-02-07 DIAGNOSIS — F250 Schizoaffective disorder, bipolar type: Secondary | ICD-10-CM | POA: Diagnosis not present
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
## 2024-02-14 ENCOUNTER — Ambulatory Visit (INDEPENDENT_AMBULATORY_CARE_PROVIDER_SITE_OTHER): Admitting: Licensed Clinical Social Worker

## 2024-02-14 DIAGNOSIS — F250 Schizoaffective disorder, bipolar type: Secondary | ICD-10-CM | POA: Diagnosis not present

## 2024-02-16 ENCOUNTER — Encounter (HOSPITAL_COMMUNITY): Payer: Self-pay | Admitting: Licensed Clinical Social Worker

## 2024-02-16 NOTE — Progress Notes (Signed)
 Virtual Visit via Video Note  I connected with Curtis Townsend on 02/14/24 at  2:30 PM EDT by a video enabled telemedicine application and verified that I am speaking with the correct person using two identifiers.  Location: Patient: home Provider: home office   I discussed the limitations of evaluation and management by telemedicine and the availability of in person appointments. The patient expressed understanding and agreed to proceed.   I discussed the assessment and treatment plan with the patient. The patient was provided an opportunity to ask questions and all were answered. The patient agreed with the plan and demonstrated an understanding of the instructions.   The patient was advised to call back or seek an in-person evaluation if the symptoms worsen or if the condition fails to improve as anticipated.  I provided 25 minutes of non-face-to-face time during this encounter.   Harlene JONELLE Rosser, LCSW   THERAPIST PROGRESS NOTE  Session Time: 2:30pm-2:55pm  Participation Level: Active  Behavioral Response: Well GroomedAlertEuthymic  Type of Therapy: Individual Therapy  Treatment Goals addressed: LTG: Reduce frequency, intensity, and duration of depression symptoms so that daily functioning is improved  LTG: Increase coping skills to manage depression and improve ability to perform daily activities  ProgressTowards Goals: Progressing  Interventions: CBT  Summary: Curtis Townsend is a 48 y.o. male who presents with Schizoaffective Disorder, mixed type.   Suicidal/Homicidal: Nowithout intent/plan  Therapist Response: Curtis Townsend engaged well in individual virtual session with clinician. Clinician explored updates from last session, noting a lot of fear, worry, and depression related to recent eviction. Clinician processed updates and noted that a solution has been found and Curtis Townsend and wife will be purchasing their own home. Clinician processed the anxiety associated with  possibly being homeless which has now transitioned into the anxiety of being responsible for a home. Clinician utilized CBT pros and cons list about home ownership. Clinician identified the positive reality that being evicted will not happen again due to the landlord deciding to sell the property because he will be his own landlord. Clinician also noted that the option of having a safe and stable home for himself and his family is of primary importance. Curtis Townsend shared excitement and hope that this will be his last move.   Plan: Return again in 2-4 weeks.  Diagnosis: Schizoaffective disorder, mixed type (HCC)  Collaboration of Care: Patient refused AEB none required  Patient/Guardian was advised Release of Information must be obtained prior to any record release in order to collaborate their care with an outside provider. Patient/Guardian was advised if they have not already done so to contact the registration department to sign all necessary forms in order for us  to release information regarding their care.   Consent: Patient/Guardian gives verbal consent for treatment and assignment of benefits for services provided during this visit. Patient/Guardian expressed understanding and agreed to proceed.   Harlene JONELLE [place], LCSW 02/16/2024

## 2024-02-25 ENCOUNTER — Ambulatory Visit

## 2024-02-27 ENCOUNTER — Ambulatory Visit

## 2024-02-27 DIAGNOSIS — E538 Deficiency of other specified B group vitamins: Secondary | ICD-10-CM

## 2024-02-27 MED ORDER — CYANOCOBALAMIN 1000 MCG/ML IJ SOLN
1000.0000 ug | Freq: Once | INTRAMUSCULAR | Status: AC
  Administered 2024-02-27: 1000 ug via INTRAMUSCULAR

## 2024-02-27 NOTE — Progress Notes (Signed)
 After obtaining consent, and per orders of Dr. Joshua, injection of B12 given by Ronnald SHAUNNA Palms. Patient instructed to report any adverse reaction to me immediately.

## 2024-03-04 ENCOUNTER — Telehealth (HOSPITAL_BASED_OUTPATIENT_CLINIC_OR_DEPARTMENT_OTHER): Admitting: Psychiatry

## 2024-03-04 ENCOUNTER — Encounter (HOSPITAL_COMMUNITY): Payer: Self-pay | Admitting: Psychiatry

## 2024-03-04 DIAGNOSIS — F4310 Post-traumatic stress disorder, unspecified: Secondary | ICD-10-CM | POA: Diagnosis not present

## 2024-03-04 DIAGNOSIS — F319 Bipolar disorder, unspecified: Secondary | ICD-10-CM | POA: Diagnosis not present

## 2024-03-04 MED ORDER — ABILIFY ASIMTUFII 960 MG/3.2ML IM PRSY: 960.0000 mg | PREFILLED_SYRINGE | INTRAMUSCULAR | 3 refills | Status: DC

## 2024-03-04 NOTE — Progress Notes (Signed)
 [facility] MD Virtual Progress Note   Patient Location: Home Provider Location: Home Office  I connect with patient by video and verified that I am speaking with correct person by using two identifiers. I discussed the limitations of evaluation and management by telemedicine and the availability of in person appointments. I also discussed with the patient that there may be a patient responsible charge related to this service. The patient expressed understanding and agreed to proceed.  Curtis Townsend [id number] 48 y.o.  03/04/2024 10:34 AM  History of Present Illness:  Patient is evaluated by video session.  He reported summer was quite.  His wife and kids went to [place] but he stayed home and taking care of the mother.  He reported things are going okay and is sleeping at least 6 hours.  He denies any panic attack, nightmares, flashbacks.  He denies any severe mood swings or anger.  He is getting Abilify  injection every 2 months and he likes because it keep his mood stable.  He does not get upset or irritable.  Denies any hallucination, paranoia or any suicidal thoughts.  He is trying to lose weight and started walking on a regular basis.  He lost more than 8 pounds since the last visit.  Patient lives with his wife and his 52 year old, 63 year old and 10-year-old.  Patient told get started school yesterday.  He reported a good relationship with his wife.  He denies drinking or using any illegal substances.  He is in therapy with Harlene which is working very well.  Like to continue Abilify  injection.  Past Psychiatric History: H/O abuse, anger, paranoia, ADHD and poor impulse control. H/O suicidal attempt as tried to hang himself and sister saved him.  H/O multiple inpatient treatment and last inpatient in September 2019. H/O THC use. Took Prozac , Risperdal , Risperdal , Depakote , hydroxyzine , Depakote  and Trazodone  but discontinued due to noncompliance.  Took cogentin  for  tremors but d/c after tremor resolved.  Past Medical History:  Diagnosis Date   Alcoholism (HCC)    Anxiety    Atypical chest pain    Bipolar 1 disorder (HCC)    Depression    Depression    History of chicken pox    History of kidney stones    Hyperlipemia    no current med.   IBS (irritable bowel syndrome)    Lipoma of back 05/2012   right   Seasonal allergies    Wears partial dentures    upper    Outpatient Encounter Medications as of 03/04/2024  Medication Sig   ARIPiprazole  ER (ABILIFY  ASIMTUFII) 960 MG/3.2ML PRSY Inject 960 mg into the muscle every 2 (two) months.   rosuvastatin  (CRESTOR ) 20 MG tablet TAKE 1 TABLET BY MOUTH EVERY DAY   Facility-Administered Encounter Medications as of 03/04/2024  Medication   ARIPiprazole  ER PRSY 960 mg    No results found for this or any previous visit (from the past 2160 hours).   Psychiatric Specialty Exam: Physical Exam  Review of Systems  Weight 218 lb (98.9 kg).There is no height or weight on file to calculate BMI.  General Appearance: Casual  Eye Contact:  Fair  Speech:  Slow  Volume:  Decreased  Mood:  Euthymic  Affect:  Congruent  Thought Process:  Descriptions of Associations: Intact  Orientation:  Full (Time, Place, and Person)  Thought Content:  Logical  Suicidal Thoughts:  No  Homicidal Thoughts:  No  Memory:  Immediate;   Good Recent;   Good  Remote;   Good  Judgement:  Intact  Insight:  Present  Psychomotor Activity:  Decreased  Concentration:  Concentration: Fair and Attention Span: Fair  Recall:  Fair  Fund of Knowledge:  Good  Language:  Good  Akathisia:  No  Handed:  Right  AIMS (if indicated):     Assets:  Communication Skills Desire for Improvement Housing Social Support Transportation  ADL's:  Intact  Cognition:  WNL  Sleep:  ok       11/29/2023    5:11 PM 05/29/2023    1:23 PM 08/26/2020    1:00 PM 01/14/2014    4:11 PM 01/14/2014    4:02 PM  Depression screen PHQ 2/9  Decreased  Interest 0 0 2 1 2   Down, Depressed, Hopeless 0 0 0 2 2  PHQ - 2 Score 0 0 2 3 4   Altered sleeping   0 2 2  Tired, decreased energy   0 2 2  Change in appetite   0 0 0  Feeling bad or failure about yourself    0 2 1  Trouble concentrating   0 0 0  Moving slowly or fidgety/restless   0 1 0  Suicidal thoughts   0 0    PHQ-9 Score   2 10 9   Difficult doing work/chores   Not difficult at all       Data saved with a previous flowsheet row definition    Assessment/Plan: PTSD (post-traumatic stress disorder) - Plan: ARIPiprazole  ER (ABILIFY  ASIMTUFII) 960 MG/3.2ML PRSY  Bipolar I disorder (HCC) - Plan: ARIPiprazole  ER (ABILIFY  ASIMTUFII) 960 MG/3.2ML PRSY  Patient is 48 year old African-American male with history of hyperlipidemia, B12 deficiency, bipolar disorder and PTSD.  Currently stable on Abilify  Asimtufi 960 mg intramuscular every 2 months.  So far no side effects.  He lost few pounds since the last visit as trying to walk every day and remain active.  Encouraged to continue therapy with Harlene.  Discussed medication side effects and benefits.  Recommend to call back if is any question or any concern.  Follow-up in 4 months.   Follow Up Instructions:     I discussed the assessment and treatment plan with the patient. The patient was provided an opportunity to ask questions and all were answered. The patient agreed with the plan and demonstrated an understanding of the instructions.   The patient was advised to call back or seek an in-person evaluation if the symptoms worsen or if the condition fails to improve as anticipated.    Collaboration of Care: Other provider involved in patient's care AEB notes are available in epic to review  Patient/Guardian was advised Release of Information must be obtained prior to any record release in order to collaborate their care with an outside provider. Patient/Guardian was advised if they have not already done so to contact the registration  department to sign all necessary forms in order for us  to release information regarding their care.   Consent: Patient/Guardian gives verbal consent for treatment and assignment of benefits for services provided during this visit. Patient/Guardian expressed understanding and agreed to proceed.     Total encounter time 22 minutes which includes face-to-face time, chart reviewed, care coordination, order entry and documentation during this encounter.   Note: This document was prepared by [employer] voice dictation technology and any errors that results from this process are unintentional.    Leni ONEIDA Client, MD 03/04/2024

## 2024-03-05 ENCOUNTER — Ambulatory Visit (INDEPENDENT_AMBULATORY_CARE_PROVIDER_SITE_OTHER): Admitting: Licensed Clinical Social Worker

## 2024-03-05 DIAGNOSIS — F250 Schizoaffective disorder, bipolar type: Secondary | ICD-10-CM | POA: Diagnosis not present

## 2024-03-06 ENCOUNTER — Encounter (HOSPITAL_COMMUNITY): Payer: Self-pay | Admitting: Licensed Clinical Social Worker

## 2024-03-06 NOTE — Progress Notes (Signed)
 Virtual Visit via Video Note  I connected with Curtis Townsend on 03/05/24 at  4:30 PM EDT by a video enabled telemedicine application and verified that I am speaking with the correct person using two identifiers.  Location: Patient: home Provider: home office   I discussed the limitations of evaluation and management by telemedicine and the availability of in person appointments. The patient expressed understanding and agreed to proceed.      I discussed the assessment and treatment plan with the patient. The patient was provided an opportunity to ask questions and all were answered. The patient agreed with the plan and demonstrated an understanding of the instructions.   The patient was advised to call back or seek an in-person evaluation if the symptoms worsen or if the condition fails to improve as anticipated.  I provided 20 minutes of non-face-to-face time during this encounter.   Harlene JONELLE Rosser, LCSW   THERAPIST PROGRESS NOTE  Session Time: 4:30pm-4:50pm  Participation Level: Active  Behavioral Response: NeatAlertEuthymic  Type of Therapy: Individual Therapy  Treatment Goals addressed: LTG: Reduce frequency, intensity, and duration of depression symptoms so that daily functioning is improved  LTG: Increase coping skills to manage depression and improve ability to perform daily activities  ProgressTowards Goals: Progressing  Interventions: Motivational Interviewing  Summary: Curtis Townsend is a 48 y.o. male who presents with Schizoaffective Disorder, mixed.   Suicidal/Homicidal: Nowithout intent/plan  Therapist Response: Curtis Townsend engaged well in individual virtual session with clinician. Clinician utilized MI OARS to reflect and summarize thoughts, feelings, and interactions. Clinician identified ongoing mood stability even with many changes upcoming. Curtis Townsend shared coping techniques possible due to stable mood, ability to adjust, and lack of reaction to  stress. Clinician identified plan to move in about 2 weeks to their own home, out of rentals, and to be able to stay somewhere indefinitely. Clinician processed that feeling and noted the importance of this achievement. Clinician received updates on family and noted that all is moving in the right direction.   Plan: Return again in 2 weeks.  Diagnosis: Schizoaffective disorder, mixed type (HCC)  Collaboration of Care: Patient refused AEB none required  Patient/Guardian was advised Release of Information must be obtained prior to any record release in order to collaborate their care with an outside provider. Patient/Guardian was advised if they have not already done so to contact the registration department to sign all necessary forms in order for us  to release information regarding their care.   Consent: Patient/Guardian gives verbal consent for treatment and assignment of benefits for services provided during this visit. Patient/Guardian expressed understanding and agreed to proceed.   Harlene JONELLE [place], LCSW 03/06/2024

## 2024-03-20 ENCOUNTER — Encounter (HOSPITAL_COMMUNITY): Payer: Self-pay | Admitting: Licensed Clinical Social Worker

## 2024-03-20 ENCOUNTER — Ambulatory Visit (INDEPENDENT_AMBULATORY_CARE_PROVIDER_SITE_OTHER): Admitting: Licensed Clinical Social Worker

## 2024-03-20 DIAGNOSIS — F250 Schizoaffective disorder, bipolar type: Secondary | ICD-10-CM

## 2024-03-20 NOTE — Progress Notes (Signed)
 Virtual Visit via Video Note  I connected with Jyron J Minus on 03/20/24 at  1:30 PM EDT by a video enabled telemedicine application and verified that I am speaking with the correct person using two identifiers.  Location: Patient: home Provider: home office   I discussed the limitations of evaluation and management by telemedicine and the availability of in person appointments. The patient expressed understanding and agreed to proceed.   I discussed the assessment and treatment plan with the patient. The patient was provided an opportunity to ask questions and all were answered. The patient agreed with the plan and demonstrated an understanding of the instructions.   The patient was advised to call back or seek an in-person evaluation if the symptoms worsen or if the condition fails to improve as anticipated.  I provided 20 minutes of non-face-to-face time during this encounter.   Harlene JONELLE Rosser, LCSW   THERAPIST PROGRESS NOTE  Session Time: 1:30pm-1:50pm  Participation Level: Active  Behavioral Response: Well GroomedAlertEuthymic  Type of Therapy: Individual Therapy  Treatment Goals addressed: LTG: Reduce frequency, intensity, and duration of depression symptoms so that daily functioning is improved  LTG: Increase coping skills to manage depression and improve ability to perform daily activities  ProgressTowards Goals: Progressing  Interventions: CBT  Summary: KASIN TONKINSON is a 48 y.o. male who presents with Schizoaffective Disorder, mixed type.   Suicidal/Homicidal: Nowithout intent/plan  Therapist Response: Shedric engaged well in individual virtual session with clinician. Clinician utilized CBT to process thoughts, feelings, and adjustment to new home. Clinician discussed the move and the current challenges associated with moving into a new home. Clinician updated address in EPIC system and discussed changes and comfort in new home. Clinician reflected the  challenges of adjustment to a new space, but the overall positive vibe of the new home. Clinician identified the importance of taking time to adjust to his new space. Jhonathan shared that everyone is happy in the house and he feels really good about the decision.   Plan: Return again in 4 weeks.  Diagnosis: Schizoaffective disorder, mixed type (HCC)  Collaboration of Care: Patient refused AEB none required  Patient/Guardian was advised Release of Information must be obtained prior to any record release in order to collaborate their care with an outside provider. Patient/Guardian was advised if they have not already done so to contact the registration department to sign all necessary forms in order for us  to release information regarding their care.   Consent: Patient/Guardian gives verbal consent for treatment and assignment of benefits for services provided during this visit. Patient/Guardian expressed understanding and agreed to proceed.   Harlene JONELLE [place], LCSW 03/20/2024

## 2024-03-31 ENCOUNTER — Ambulatory Visit (INDEPENDENT_AMBULATORY_CARE_PROVIDER_SITE_OTHER)

## 2024-03-31 DIAGNOSIS — E538 Deficiency of other specified B group vitamins: Secondary | ICD-10-CM

## 2024-03-31 MED ORDER — CYANOCOBALAMIN 1000 MCG/ML IJ SOLN
1000.0000 ug | Freq: Once | INTRAMUSCULAR | Status: AC
  Administered 2024-03-31: 1000 ug via INTRAMUSCULAR

## 2024-03-31 NOTE — Progress Notes (Signed)
 After obtaining consent, and per orders of Dr. Joshua, injection of B12 given by Ronnald SHAUNNA Palms. Patient instructed to o report any adverse reaction to me immediately.

## 2024-04-01 ENCOUNTER — Ambulatory Visit: Admitting: Internal Medicine

## 2024-04-03 ENCOUNTER — Ambulatory Visit (HOSPITAL_BASED_OUTPATIENT_CLINIC_OR_DEPARTMENT_OTHER)

## 2024-04-03 ENCOUNTER — Encounter (HOSPITAL_COMMUNITY): Payer: Self-pay

## 2024-04-03 VITALS — BP 115/74 | HR 86 | Ht 73.0 in | Wt 212.0 lb

## 2024-04-03 DIAGNOSIS — F319 Bipolar disorder, unspecified: Secondary | ICD-10-CM | POA: Diagnosis not present

## 2024-04-03 NOTE — Progress Notes (Signed)
 Patient arrives today for his due injection of Abilify  Asimtufii 960 mg/3.2 mL. Patient arrived well groomed with a flat affect. Patient is happy that he and his wife just bought a new house in a quieter neighborhood. Patient states the injection is lasting 2 months and he denies SI/HI or AVH. Injection was prepared as ordered and administered in patients RUOQ. Patient tolerated well and without complaint.      NDC: 40851-885-19 LOT: [id number] EXP: [id number]

## 2024-04-17 ENCOUNTER — Encounter (HOSPITAL_COMMUNITY): Payer: Self-pay

## 2024-04-17 ENCOUNTER — Ambulatory Visit (HOSPITAL_COMMUNITY): Admitting: Licensed Clinical Social Worker

## 2024-04-30 ENCOUNTER — Ambulatory Visit: Admitting: Internal Medicine

## 2024-05-01 ENCOUNTER — Ambulatory Visit (INDEPENDENT_AMBULATORY_CARE_PROVIDER_SITE_OTHER): Admitting: Licensed Clinical Social Worker

## 2024-05-01 DIAGNOSIS — F250 Schizoaffective disorder, bipolar type: Secondary | ICD-10-CM

## 2024-05-08 ENCOUNTER — Encounter (HOSPITAL_COMMUNITY): Payer: Self-pay | Admitting: Licensed Clinical Social Worker

## 2024-05-08 NOTE — Progress Notes (Signed)
 Virtual Visit via Video Note  I connected with Curtis Townsend on 05/01/24 at 12:30 PM EDT by a video enabled telemedicine application and verified that I am speaking with the correct person using two identifiers.  Location: Patient: home Provider: home office   I discussed the limitations of evaluation and management by telemedicine and the availability of in person appointments. The patient expressed understanding and agreed to proceed.   I discussed the assessment and treatment plan with the patient. The patient was provided an opportunity to ask questions and all were answered. The patient agreed with the plan and demonstrated an understanding of the instructions.   The patient was advised to call back or seek an in-person evaluation if the symptoms worsen or if the condition fails to improve as anticipated.  I provided 45 minutes of non-face-to-face time during this encounter.   Harlene JONELLE Rosser, LCSW   THERAPIST PROGRESS NOTE  Session Time: 12:30pm-1:15pm  Participation Level: Active  Behavioral Response: NeatAlertDepressed  Type of Therapy: Individual Therapy  Treatment Goals addressed:  LTG: Reduce frequency, intensity, and duration of depression symptoms so that daily functioning is improved  LTG: Increase coping skills to manage depression and improve ability to perform daily activities  ProgressTowards Goals: Progressing  Interventions: Motivational Interviewing  Summary: Curtis Townsend is a 48 y.o. male who presents with Schizoaffective Disorder, mixed type.   Suicidal/Homicidal: Nowithout intent/plan  Therapist Response: Curtis Townsend engaged well in individual virtual session with clinician. Clinician utilized MI OARS to reflect and summarize thoughts, feelings, and interactions. Clinician processed recent visit to [facility] to see his father who was in the hospital. Clinician reflected the challenges of going there to see father, as well as brother and sister.  Clinician provided time and space for Curtis Townsend to share stressors, past hx, and attitudes. Clinician identified decision making process, which included leaving early to come home. Clinician summarized Curtis Townsend's feelings about the way his siblings treated him and have always treated him, which has not been very nice. Clinician validated Curtis Townsend's decision.   Plan: Return again in 2-4 weeks.  Diagnosis: Schizoaffective disorder, mixed type (HCC)  Collaboration of Care: Patient refused AEB none required  Patient/Guardian was advised Release of Information must be obtained prior to any record release in order to collaborate their care with an outside provider. Patient/Guardian was advised if they have not already done so to contact the registration department to sign all necessary forms in order for us  to release information regarding their care.   Consent: Patient/Guardian gives verbal consent for treatment and assignment of benefits for services provided during this visit. Patient/Guardian expressed understanding and agreed to proceed.   Harlene JONELLE [place], LCSW 05/08/2024

## 2024-05-15 ENCOUNTER — Ambulatory Visit (INDEPENDENT_AMBULATORY_CARE_PROVIDER_SITE_OTHER): Admitting: Licensed Clinical Social Worker

## 2024-05-15 ENCOUNTER — Encounter (HOSPITAL_COMMUNITY): Payer: Self-pay | Admitting: Licensed Clinical Social Worker

## 2024-05-15 DIAGNOSIS — F250 Schizoaffective disorder, bipolar type: Secondary | ICD-10-CM

## 2024-05-15 NOTE — Progress Notes (Signed)
 Virtual Visit via Video Note  I connected with Alyus J Deeb on 05/15/24 at 12:30 PM EST by a video enabled telemedicine application and verified that I am speaking with the correct person using two identifiers.  Location: Patient: home Provider: home office   I discussed the limitations of evaluation and management by telemedicine and the availability of in person appointments. The patient expressed understanding and agreed to proceed.   I discussed the assessment and treatment plan with the patient. The patient was provided an opportunity to ask questions and all were answered. The patient agreed with the plan and demonstrated an understanding of the instructions.   The patient was advised to call back or seek an in-person evaluation if the symptoms worsen or if the condition fails to improve as anticipated.  I provided 30 minutes of non-face-to-face time during this encounter.   Harlene JONELLE Rosser, LCSW   THERAPIST PROGRESS NOTE  Session Time: 12:30pm-1:00pm  Participation Level: Active  Behavioral Response: Well GroomedAlertDepressed  Type of Therapy: Individual Therapy  Treatment Goals addressed: LTG: Reduce frequency, intensity, and duration of depression symptoms so that daily functioning is improved  LTG: Increase coping skills to manage depression and improve ability to perform daily activities  ProgressTowards Goals: Progressing  Interventions: Other: grief counseling  Summary: Sohaib J Deharo is a 48 y.o. male who presents with Schizoaffective disorder, mixed type.   Suicidal/Homicidal: Nowithout intent/plan  Therapist Response: Rebecca engaged well in individual virtual session. Clinician utilized Grief Counseling to process thoughts and feelings regarding the recent loss of his father. Clinician provided psychoeducation about the stages and nature of grief. Clinician noted that grief changes over time, but never really disappears. Clinician processed  [name]'s feelings and his uneasiness sharing his feelings. He shared that he is still in shock and has not been able to process this yet. Clinician explored memories of last phone call, last visit, and the family's reaction to his death.   Plan: Return again in 2 weeks.  Diagnosis: Schizoaffective disorder, mixed type (HCC)  Collaboration of Care: Psychiatrist AEB informed Dr. Arfeen that Chawn's father died.  Patient/Guardian was advised Release of Information must be obtained prior to any record release in order to collaborate their care with an outside provider. Patient/Guardian was advised if they have not already done so to contact the registration department to sign all necessary forms in order for us  to release information regarding their care.   Consent: Patient/Guardian gives verbal consent for treatment and assignment of benefits for services provided during this visit. Patient/Guardian expressed understanding and agreed to proceed.   Harlene JONELLE Levant, LCSW 05/15/2024

## 2024-05-28 ENCOUNTER — Encounter (HOSPITAL_COMMUNITY): Payer: Self-pay | Admitting: Licensed Clinical Social Worker

## 2024-05-28 ENCOUNTER — Ambulatory Visit (INDEPENDENT_AMBULATORY_CARE_PROVIDER_SITE_OTHER): Admitting: Licensed Clinical Social Worker

## 2024-05-28 DIAGNOSIS — F250 Schizoaffective disorder, bipolar type: Secondary | ICD-10-CM | POA: Diagnosis not present

## 2024-05-28 NOTE — Progress Notes (Signed)
 Virtual Visit via Video Note  I connected with Curtis Townsend on 05/28/24 at  2:30 PM EST by a video enabled telemedicine application and verified that I am speaking with the correct person using two identifiers.  Location: Patient: home Provider: home office   I discussed the limitations of evaluation and management by telemedicine and the availability of in person appointments. The patient expressed understanding and agreed to proceed.     I discussed the assessment and treatment plan with the patient. The patient was provided an opportunity to ask questions and all were answered. The patient agreed with the plan and demonstrated an understanding of the instructions.   The patient was advised to call back or seek an in-person evaluation if the symptoms worsen or if the condition fails to improve as anticipated.  I provided 30 minutes of non-face-to-face time during this encounter.   Harlene JONELLE Rosser, LCSW   THERAPIST PROGRESS NOTE  Session Time: 2:30pm-3:00pm  Participation Level: Active  Behavioral Response: Well GroomedAlertEuthymic  Type of Therapy: Individual Therapy  Treatment Goals addressed: LTG: Reduce frequency, intensity, and duration of depression symptoms so that daily functioning is improved  LTG: Increase coping skills to manage depression and improve ability to perform daily activities  ProgressTowards Goals: Progressing  Interventions: Motivational Interviewing  Summary: Curtis Townsend is a 48 y.o. male who presents with Schizoaffective disorder, mixed.   Suicidal/Homicidal: Nowithout intent/plan  Therapist Response: Curtis Townsend engaged well in individual virtual session with clinician. Clinician utilized MI OARS to reflect and summarize thoughts, feelings, and interactions. Clinician explored updates in mood stability, grief, and family interactions. Clinician reflected the mixed feelings around grief over father. Clinician also reflected the option  to grieve if and when he feels the need. Curtis Townsend shared he has not cried and has not felt the need to share his feelings about father's death. Curtis Townsend shared his children are more emotionally reactive and have been sharing their grief, but he is not yet feeling it. Clinician identified the possibility that the grief will present later once shock wears off. Curtis Townsend shared otherwise life is stable.   Plan: Return again in 4 weeks.  Diagnosis: Schizoaffective disorder, mixed type (HCC)  Collaboration of Care: Patient refused AEB none required  Patient/Guardian was advised Release of Information must be obtained prior to any record release in order to collaborate their care with an outside provider. Patient/Guardian was advised if they have not already done so to contact the registration department to sign all necessary forms in order for us  to release information regarding their care.   Consent: Patient/Guardian gives verbal consent for treatment and assignment of benefits for services provided during this visit. Patient/Guardian expressed understanding and agreed to proceed.   Harlene JONELLE [place], LCSW 05/28/2024

## 2024-06-02 ENCOUNTER — Ambulatory Visit (HOSPITAL_COMMUNITY)

## 2024-06-02 ENCOUNTER — Encounter (HOSPITAL_COMMUNITY): Payer: Self-pay

## 2024-06-02 VITALS — BP 120/79 | HR 81 | Ht 73.0 in | Wt 218.0 lb

## 2024-06-02 DIAGNOSIS — F319 Bipolar disorder, unspecified: Secondary | ICD-10-CM | POA: Diagnosis not present

## 2024-06-02 NOTE — Progress Notes (Signed)
 Patient arrived today for his overdue injection of Abilify  Asimtufii 960 mg / 3.2 mL. Patient presents well groomed with a flat affect. Patient states that he is staying home this Thanksgiving instead of going with his wife to family's house. Patient said that his father recently passed and he does not want to be around people. Patient denies SI/HI or AVH. Injection was prepared as ordered and administered in patients LUOQ, patient tolerated well and without complaint.     NDC: 40851-885-19 LOT: [id number] EXP: [id number]

## 2024-06-07 ENCOUNTER — Other Ambulatory Visit: Payer: Self-pay | Admitting: Internal Medicine

## 2024-06-07 DIAGNOSIS — E785 Hyperlipidemia, unspecified: Secondary | ICD-10-CM

## 2024-06-24 ENCOUNTER — Encounter (HOSPITAL_COMMUNITY): Payer: Self-pay | Admitting: Psychiatry

## 2024-06-24 ENCOUNTER — Telehealth (HOSPITAL_COMMUNITY): Admitting: Psychiatry

## 2024-06-24 VITALS — Wt 218.0 lb

## 2024-06-24 DIAGNOSIS — F4310 Post-traumatic stress disorder, unspecified: Secondary | ICD-10-CM

## 2024-06-24 DIAGNOSIS — F319 Bipolar disorder, unspecified: Secondary | ICD-10-CM

## 2024-06-24 MED ORDER — ABILIFY ASIMTUFII 960 MG/3.2ML IM PRSY: 960.0000 mg | PREFILLED_SYRINGE | INTRAMUSCULAR | 3 refills | Status: AC

## 2024-06-24 NOTE — Progress Notes (Signed)
 [facility] MD Virtual Progress Note   Patient Location: Home Provider Location: Home Office  I connect with patient by video and verified that I am speaking with correct person by using two identifiers. I discussed the limitations of evaluation and management by telemedicine and the availability of in person appointments. I also discussed with the patient that there may be a patient responsible charge related to this service. The patient expressed understanding and agreed to proceed.  Curtis Townsend [id number] 48 y.o.  06/24/2024 10:38 AM  History of Present Illness:  Patient is evaluated by video session.  He is getting Abilify  injection every 2 months.  He denies any mania psychosis or any hallucination.  Patient told he lost his father in November.  Patient told his father was 48 year old and living in [place]  and he had a fall and he never became conscious.  Patient told he was on a life support with his brother decided to take him off from life support.  In the beginning patient was very sad and going through grief process but now he is doing better.  He is in therapy with Harlene.  He is sleeping okay.  He denies any mania psychosis, hallucination or any paranoia.  He lives with his wife and his 3 children.  His appetite is okay.  His weight is stable.  Patient told he had a visit with his primary care Dr. Joshua for physical and labs but could not keep the appointment because he has to go to funeral in [place] .  Patient has no tremor or shakes or any EPS.  His nightmares and flashbacks are not as bad.  He is able to sleep okay.  He denies any anger or any suicidal thoughts.  Like to continue Abilify  injection.  Past Psychiatric History: H/O abuse, anger, paranoia, ADHD and poor impulse control. H/O suicidal attempt as tried to hang himself and sister saved him.  H/O multiple inpatient treatment and last inpatient in September 2019. H/O THC use. Took Prozac ,  Risperdal , Risperdal , Depakote , hydroxyzine , Depakote  and Trazodone  but discontinued due to noncompliance.  Took cogentin  for tremors but d/c after tremor resolved.  Past Medical History:  Diagnosis Date   Alcoholism (HCC)    Anxiety    Atypical chest pain    Bipolar 1 disorder (HCC)    Depression    Depression    History of chicken pox    History of kidney stones    Hyperlipemia    no current med.   IBS (irritable bowel syndrome)    Lipoma of back 05/2012   right   Seasonal allergies    Wears partial dentures    upper    Outpatient Encounter Medications as of 06/24/2024  Medication Sig   ARIPiprazole  ER (ABILIFY  ASIMTUFII) 960 MG/3.2ML PRSY Inject 960 mg into the muscle every 2 (two) months.   rosuvastatin  (CRESTOR ) 20 MG tablet TAKE 1 TABLET BY MOUTH EVERY DAY   [DISCONTINUED] ARIPiprazole  ER (ABILIFY  ASIMTUFII) 960 MG/3.2ML PRSY Inject 960 mg into the muscle every 2 (two) months.   Facility-Administered Encounter Medications as of 06/24/2024  Medication   ARIPiprazole  ER PRSY 960 mg    No results found for this or any previous visit (from the past 2160 hours).   Psychiatric Specialty Exam: Physical Exam  Review of Systems  Weight 218 lb (98.9 kg).There is no height or weight on file to calculate BMI.  General Appearance: Casual  Eye Contact:  Fair  Speech:  Slow  Volume:  Decreased  Mood:  Dysphoric  Affect:  Congruent  Thought Process:  Descriptions of Associations: Intact  Orientation:  Full (Time, Place, and Person)  Thought Content:  Logical  Suicidal Thoughts:  No  Homicidal Thoughts:  No  Memory:  Immediate;   Good Recent;   Good Remote;   Good  Judgement:  Intact  Insight:  Present  Psychomotor Activity:  Decreased  Concentration:  Concentration: Fair and Attention Span: Fair  Recall:  Fair  Fund of Knowledge:  Good  Language:  Good  Akathisia:  No  Handed:  Right  AIMS (if indicated):     Assets:  Communication Skills Desire for  Improvement Housing Social Support Transportation  ADL's:  Intact  Cognition:  WNL  Sleep:  ok       11/29/2023    5:11 PM 05/29/2023    1:23 PM 08/26/2020    1:00 PM 01/14/2014    4:11 PM 01/14/2014    4:02 PM  Depression screen PHQ 2/9  Decreased Interest 0 0 2 1 2   Down, Depressed, Hopeless 0 0 0 2 2  PHQ - 2 Score 0 0 2 3 4   Altered sleeping   0 2 2  Tired, decreased energy   0 2 2  Change in appetite   0 0 0  Feeling bad or failure about yourself    0 2 1  Trouble concentrating   0 0 0  Moving slowly or fidgety/restless   0 1 0  Suicidal thoughts   0 0    PHQ-9 Score   2  10  9    Difficult doing work/chores   Not difficult at all       Data saved with a previous flowsheet row definition    Assessment/Plan: Bipolar I disorder (HCC) - Plan: ARIPiprazole  ER (ABILIFY  ASIMTUFII) 960 MG/3.2ML PRSY  PTSD (post-traumatic stress disorder) - Plan: ARIPiprazole  ER (ABILIFY  ASIMTUFII) 960 MG/3.2ML PRSY  Patient is 48 year old African-American male who has a diagnosis of bipolar disorder and PTSD.  Currently stable on Abilify  Asimtufii 960 mg intramuscular every 2 months.  Patient did not had major side effects from the medication.  Discussed recent loss of father who died in June 18, 2024 after a fall.  Offered grief counseling but patient feels does not needed as already seeing Harlene for therapy and it is going well.  Encouraged to continue therapy with Harlene.  Discussed medication side effects and benefits.  Patient has not had physical since Jun 19, 2023.  Encouraged to call his PCP and schedule appointment.  Patient missed the appointment because he has to attend the funeral.  I also discussed if he had difficulty getting labs then we can do blood work in our office.  Patient acknowledged and agree.  Recommend to call back if is any question or any concern.  Follow-up in 4 months.   Follow Up Instructions:     I discussed the assessment and treatment plan with the patient. The  patient was provided an opportunity to ask questions and all were answered. The patient agreed with the plan and demonstrated an understanding of the instructions.   The patient was advised to call back or seek an in-person evaluation if the symptoms worsen or if the condition fails to improve as anticipated.    Collaboration of Care: Other provider involved in patient's care AEB notes are available in epic to review  Patient/Guardian was advised Release of Information must be obtained prior to any record release in order  to collaborate their care with an outside provider. Patient/Guardian was advised if they have not already done so to contact the registration department to sign all necessary forms in order for us  to release information regarding their care.   Consent: Patient/Guardian gives verbal consent for treatment and assignment of benefits for services provided during this visit. Patient/Guardian expressed understanding and agreed to proceed.     Total encounter time 16 minutes which includes face-to-face time, chart reviewed, care coordination, order entry and documentation during this encounter.   Note: This document was prepared by [employer] voice dictation technology and any errors that results from this process are unintentional.    Leni ONEIDA Client, MD 06/24/2024

## 2024-07-31 ENCOUNTER — Ambulatory Visit (HOSPITAL_COMMUNITY): Payer: Medicare (Managed Care)

## 2024-07-31 ENCOUNTER — Other Ambulatory Visit: Payer: Self-pay

## 2024-07-31 ENCOUNTER — Encounter (HOSPITAL_COMMUNITY): Payer: Self-pay

## 2024-07-31 VITALS — BP 124/78 | HR 105 | Ht 73.0 in | Wt 225.0 lb

## 2024-07-31 DIAGNOSIS — F250 Schizoaffective disorder, bipolar type: Secondary | ICD-10-CM

## 2024-07-31 DIAGNOSIS — F319 Bipolar disorder, unspecified: Secondary | ICD-10-CM | POA: Diagnosis not present

## 2024-07-31 NOTE — Progress Notes (Signed)
 Patient arrives today for his due injection of Abilify  Asimtufii 960 mg. Patient presents well groomed with a flat affect. Patient states that his father passed in November and that put a damper on the holidays. Patient says medication is lasting and denies any SI/HI or AVH. Injection prepared as ordered and administered in the patients RUOQ. Patient tolerated well and without complaint and will follow up in 60 days.    NDC: 40851-885-19 LOT: [id number] EXP: [id number]

## 2024-08-01 ENCOUNTER — Encounter: Payer: Self-pay | Admitting: Podiatry

## 2024-08-01 ENCOUNTER — Ambulatory Visit: Payer: Medicare (Managed Care) | Admitting: Podiatry

## 2024-08-01 ENCOUNTER — Ambulatory Visit: Payer: Medicare (Managed Care)

## 2024-08-01 DIAGNOSIS — M722 Plantar fascial fibromatosis: Secondary | ICD-10-CM

## 2024-08-01 DIAGNOSIS — M79671 Pain in right foot: Secondary | ICD-10-CM

## 2024-08-01 DIAGNOSIS — M7731 Calcaneal spur, right foot: Secondary | ICD-10-CM

## 2024-08-01 MED ORDER — TRIAMCINOLONE ACETONIDE 40 MG/ML IJ SUSP
40.0000 mg | Freq: Once | INTRAMUSCULAR | Status: AC
  Administered 2024-08-01: 40 mg

## 2024-08-01 NOTE — Patient Instructions (Signed)

## 2024-08-01 NOTE — Progress Notes (Signed)
 Patient presents with 4 days complaint of pain at at the plantar medial aspect of the heel.  Does not have any injury to it.  Has not noticed any redness or ecchymosis.  Hurts when he first stands up in the morning or after standing up after sitting for a while.   Physical exam:  General appearance: Pleasant, and in no acute distress. AOx3.  Vascular: Pedal pulses: DP 2/4 bilaterally, PT 2/4 bilaterally.  Minimal edema lower legs bilaterally. Capillary fill time immediate bilaterally  Neurological: Light touch intact feet bilaterally.  Normal Achilles reflex bilaterally.  No clonus or spasticity noted.  Negative Tinel sign tarsal tunnel and porta pedis bilaterally.  Dermatologic:   Skin normal temperature bilaterally.  Skin normal color, tone, and texture bilaterally.   Musculoskeletal: The plantar medial aspect of the heel at the medial plantar calcaneal tubercle right.  No tenderness with lateral compression of calcaneus.  No fibromas palpable.  Radiographs: 3 views foot right: Osteophytic changes plantar calcaneal tubercle.  No signs fractures or dislocations.  Normal bone density.  No evidence of bone tumors.  Diagnosis: 1.  Pain right foot 2.  Plantar fasciitis right 3.  Calcaneal spur right   Plan: - New patient office visit for evaluation and management.  Level 3. -Discussed etiology and treatment of plantar fasciitis.  Discussed proper supportive shoes.  Discussed things to avoid. -Dispensed written instructions for at home exercises and therapy. - Injected 3cc 2:1 mixture 0.5 cc Marcaine : Triamcinolone  40mg /65ml at her fascial origin medial plantar calcaneal tubercle right.    Return 2 weeks follow-up injection plantar fascia right

## 2024-08-15 ENCOUNTER — Ambulatory Visit: Payer: Medicare (Managed Care) | Admitting: Podiatry

## 2024-08-27 ENCOUNTER — Encounter: Admitting: Internal Medicine

## 2024-09-29 ENCOUNTER — Ambulatory Visit (HOSPITAL_COMMUNITY): Payer: Medicare (Managed Care)

## 2024-10-21 ENCOUNTER — Telehealth (HOSPITAL_COMMUNITY): Admitting: Psychiatry
